# Patient Record
Sex: Female | Born: 1940 | Race: Black or African American | Hispanic: No | Smoking: Former smoker
Health system: Southern US, Community
[De-identification: ages and names within clinical notes are randomized; demographics above are authoritative.]

## PROBLEM LIST (undated history)

## (undated) DIAGNOSIS — I10 Essential (primary) hypertension: Secondary | ICD-10-CM

## (undated) DIAGNOSIS — M1990 Unspecified osteoarthritis, unspecified site: Secondary | ICD-10-CM

## (undated) DIAGNOSIS — E119 Type 2 diabetes mellitus without complications: Secondary | ICD-10-CM

## (undated) DIAGNOSIS — F329 Major depressive disorder, single episode, unspecified: Secondary | ICD-10-CM

## (undated) DIAGNOSIS — N179 Acute kidney failure, unspecified: Secondary | ICD-10-CM

## (undated) DIAGNOSIS — K5792 Diverticulitis of intestine, part unspecified, without perforation or abscess without bleeding: Secondary | ICD-10-CM

## (undated) DIAGNOSIS — F32A Depression, unspecified: Secondary | ICD-10-CM

## (undated) HISTORY — PX: BACK SURGERY: SHX140

## (undated) HISTORY — DX: Depression, unspecified: F32.A

## (undated) HISTORY — PX: OTHER SURGICAL HISTORY: SHX169

## (undated) HISTORY — DX: Diverticulitis of intestine, part unspecified, without perforation or abscess without bleeding: K57.92

## (undated) HISTORY — DX: Essential (primary) hypertension: I10

## (undated) HISTORY — PX: ROTATOR CUFF REPAIR: SHX139

## (undated) HISTORY — DX: Unspecified osteoarthritis, unspecified site: M19.90

## (undated) HISTORY — PX: BREAST SURGERY: SHX581

## (undated) HISTORY — DX: Major depressive disorder, single episode, unspecified: F32.9

## (undated) HISTORY — PX: KNEE SURGERY: SHX244

---

## 1997-08-06 ENCOUNTER — Ambulatory Visit (HOSPITAL_COMMUNITY): Admission: RE | Admit: 1997-08-06 | Discharge: 1997-08-06 | Payer: Self-pay | Admitting: Obstetrics and Gynecology

## 1997-10-19 ENCOUNTER — Other Ambulatory Visit: Admission: RE | Admit: 1997-10-19 | Discharge: 1997-10-19 | Payer: Self-pay | Admitting: Obstetrics and Gynecology

## 1998-02-14 ENCOUNTER — Encounter: Payer: Self-pay | Admitting: Obstetrics and Gynecology

## 1998-02-14 ENCOUNTER — Ambulatory Visit (HOSPITAL_COMMUNITY): Admission: RE | Admit: 1998-02-14 | Discharge: 1998-02-14 | Payer: Self-pay | Admitting: Obstetrics and Gynecology

## 1998-05-06 ENCOUNTER — Emergency Department (HOSPITAL_COMMUNITY): Admission: EM | Admit: 1998-05-06 | Discharge: 1998-05-06 | Payer: Self-pay | Admitting: Emergency Medicine

## 1999-03-04 ENCOUNTER — Ambulatory Visit (HOSPITAL_COMMUNITY): Admission: RE | Admit: 1999-03-04 | Discharge: 1999-03-04 | Payer: Self-pay | Admitting: Obstetrics and Gynecology

## 1999-03-04 ENCOUNTER — Encounter: Payer: Self-pay | Admitting: Obstetrics and Gynecology

## 1999-03-06 ENCOUNTER — Other Ambulatory Visit: Admission: RE | Admit: 1999-03-06 | Discharge: 1999-03-06 | Payer: Self-pay | Admitting: Obstetrics and Gynecology

## 1999-10-11 ENCOUNTER — Ambulatory Visit (HOSPITAL_COMMUNITY): Admission: RE | Admit: 1999-10-11 | Discharge: 1999-10-11 | Payer: Self-pay | Admitting: *Deleted

## 1999-10-11 ENCOUNTER — Encounter: Payer: Self-pay | Admitting: *Deleted

## 1999-11-18 ENCOUNTER — Encounter: Admission: RE | Admit: 1999-11-18 | Discharge: 1999-11-18 | Payer: Self-pay | Admitting: Neurosurgery

## 1999-11-18 ENCOUNTER — Encounter: Payer: Self-pay | Admitting: Neurosurgery

## 2000-02-28 ENCOUNTER — Emergency Department (HOSPITAL_COMMUNITY): Admission: EM | Admit: 2000-02-28 | Discharge: 2000-02-28 | Payer: Self-pay | Admitting: Emergency Medicine

## 2000-04-20 ENCOUNTER — Other Ambulatory Visit: Admission: RE | Admit: 2000-04-20 | Discharge: 2000-04-20 | Payer: Self-pay | Admitting: Obstetrics and Gynecology

## 2000-05-01 ENCOUNTER — Encounter: Payer: Self-pay | Admitting: Orthopedic Surgery

## 2000-05-01 ENCOUNTER — Ambulatory Visit (HOSPITAL_COMMUNITY): Admission: RE | Admit: 2000-05-01 | Discharge: 2000-05-01 | Payer: Self-pay | Admitting: *Deleted

## 2000-07-05 ENCOUNTER — Encounter: Admission: RE | Admit: 2000-07-05 | Discharge: 2000-07-05 | Payer: Self-pay | Admitting: Orthopedic Surgery

## 2000-07-05 ENCOUNTER — Encounter: Payer: Self-pay | Admitting: Orthopedic Surgery

## 2000-07-06 ENCOUNTER — Ambulatory Visit (HOSPITAL_BASED_OUTPATIENT_CLINIC_OR_DEPARTMENT_OTHER): Admission: RE | Admit: 2000-07-06 | Discharge: 2000-07-06 | Payer: Self-pay | Admitting: Orthopedic Surgery

## 2001-02-03 ENCOUNTER — Ambulatory Visit (HOSPITAL_COMMUNITY): Admission: RE | Admit: 2001-02-03 | Discharge: 2001-02-03 | Payer: Self-pay | Admitting: Internal Medicine

## 2001-02-03 ENCOUNTER — Encounter: Payer: Self-pay | Admitting: Internal Medicine

## 2001-04-20 ENCOUNTER — Other Ambulatory Visit: Admission: RE | Admit: 2001-04-20 | Discharge: 2001-04-20 | Payer: Self-pay | Admitting: Obstetrics and Gynecology

## 2001-05-07 ENCOUNTER — Encounter: Payer: Self-pay | Admitting: Emergency Medicine

## 2001-05-07 ENCOUNTER — Emergency Department (HOSPITAL_COMMUNITY): Admission: EM | Admit: 2001-05-07 | Discharge: 2001-05-07 | Payer: Self-pay | Admitting: Emergency Medicine

## 2001-10-19 ENCOUNTER — Encounter: Admission: RE | Admit: 2001-10-19 | Discharge: 2001-10-19 | Payer: Self-pay | Admitting: Pediatrics

## 2001-10-19 ENCOUNTER — Encounter: Payer: Self-pay | Admitting: Pediatrics

## 2002-02-10 ENCOUNTER — Ambulatory Visit (HOSPITAL_COMMUNITY): Admission: RE | Admit: 2002-02-10 | Discharge: 2002-02-10 | Payer: Self-pay | Admitting: Internal Medicine

## 2002-02-10 ENCOUNTER — Encounter: Payer: Self-pay | Admitting: Internal Medicine

## 2002-03-24 ENCOUNTER — Ambulatory Visit (HOSPITAL_COMMUNITY): Admission: RE | Admit: 2002-03-24 | Discharge: 2002-03-24 | Payer: Self-pay | Admitting: Gastroenterology

## 2002-05-05 ENCOUNTER — Other Ambulatory Visit: Admission: RE | Admit: 2002-05-05 | Discharge: 2002-05-05 | Payer: Self-pay | Admitting: Obstetrics and Gynecology

## 2002-11-28 ENCOUNTER — Encounter: Admission: RE | Admit: 2002-11-28 | Discharge: 2002-11-28 | Payer: Self-pay | Admitting: Internal Medicine

## 2002-12-12 ENCOUNTER — Emergency Department (HOSPITAL_COMMUNITY): Admission: AD | Admit: 2002-12-12 | Discharge: 2002-12-12 | Payer: Self-pay | Admitting: Family Medicine

## 2003-01-22 ENCOUNTER — Ambulatory Visit (HOSPITAL_COMMUNITY): Admission: RE | Admit: 2003-01-22 | Discharge: 2003-01-22 | Payer: Self-pay | Admitting: Orthopedic Surgery

## 2003-02-20 ENCOUNTER — Encounter: Admission: RE | Admit: 2003-02-20 | Discharge: 2003-02-20 | Payer: Self-pay | Admitting: Allergy and Immunology

## 2003-06-12 ENCOUNTER — Emergency Department (HOSPITAL_COMMUNITY): Admission: EM | Admit: 2003-06-12 | Discharge: 2003-06-12 | Payer: Self-pay | Admitting: Emergency Medicine

## 2003-07-01 ENCOUNTER — Ambulatory Visit (HOSPITAL_COMMUNITY): Admission: RE | Admit: 2003-07-01 | Discharge: 2003-07-01 | Payer: Self-pay | Admitting: Internal Medicine

## 2003-07-10 ENCOUNTER — Other Ambulatory Visit: Admission: RE | Admit: 2003-07-10 | Discharge: 2003-07-10 | Payer: Self-pay | Admitting: Obstetrics and Gynecology

## 2003-08-10 ENCOUNTER — Ambulatory Visit (HOSPITAL_COMMUNITY): Admission: RE | Admit: 2003-08-10 | Discharge: 2003-08-10 | Payer: Self-pay | Admitting: Obstetrics and Gynecology

## 2003-08-27 ENCOUNTER — Encounter: Admission: RE | Admit: 2003-08-27 | Discharge: 2003-10-08 | Payer: Self-pay | Admitting: Neurosurgery

## 2003-09-23 ENCOUNTER — Emergency Department (HOSPITAL_COMMUNITY): Admission: EM | Admit: 2003-09-23 | Discharge: 2003-09-23 | Payer: Self-pay | Admitting: Emergency Medicine

## 2003-10-05 ENCOUNTER — Encounter: Admission: RE | Admit: 2003-10-05 | Discharge: 2004-01-03 | Payer: Self-pay | Admitting: Orthopedic Surgery

## 2004-02-26 ENCOUNTER — Encounter: Admission: RE | Admit: 2004-02-26 | Discharge: 2004-02-26 | Payer: Self-pay | Admitting: Allergy and Immunology

## 2004-07-10 ENCOUNTER — Other Ambulatory Visit: Admission: RE | Admit: 2004-07-10 | Discharge: 2004-07-10 | Payer: Self-pay | Admitting: Obstetrics and Gynecology

## 2004-09-16 ENCOUNTER — Ambulatory Visit (HOSPITAL_COMMUNITY): Admission: RE | Admit: 2004-09-16 | Discharge: 2004-09-16 | Payer: Self-pay | Admitting: Sports Medicine

## 2004-09-29 ENCOUNTER — Encounter: Admission: RE | Admit: 2004-09-29 | Discharge: 2004-09-29 | Payer: Self-pay | Admitting: Sports Medicine

## 2004-10-16 ENCOUNTER — Encounter: Admission: RE | Admit: 2004-10-16 | Discharge: 2004-10-16 | Payer: Self-pay | Admitting: Sports Medicine

## 2004-10-31 ENCOUNTER — Encounter: Admission: RE | Admit: 2004-10-31 | Discharge: 2004-10-31 | Payer: Self-pay | Admitting: Sports Medicine

## 2004-12-02 ENCOUNTER — Emergency Department (HOSPITAL_COMMUNITY): Admission: EM | Admit: 2004-12-02 | Discharge: 2004-12-03 | Payer: Self-pay | Admitting: Emergency Medicine

## 2005-01-08 ENCOUNTER — Inpatient Hospital Stay (HOSPITAL_COMMUNITY): Admission: RE | Admit: 2005-01-08 | Discharge: 2005-01-10 | Payer: Self-pay | Admitting: Neurosurgery

## 2005-03-17 ENCOUNTER — Encounter: Admission: RE | Admit: 2005-03-17 | Discharge: 2005-03-17 | Payer: Self-pay | Admitting: Internal Medicine

## 2005-07-14 ENCOUNTER — Other Ambulatory Visit: Admission: RE | Admit: 2005-07-14 | Discharge: 2005-07-14 | Payer: Self-pay | Admitting: Obstetrics and Gynecology

## 2005-08-20 ENCOUNTER — Ambulatory Visit (HOSPITAL_COMMUNITY): Admission: RE | Admit: 2005-08-20 | Discharge: 2005-08-20 | Payer: Self-pay | Admitting: Neurosurgery

## 2006-05-04 ENCOUNTER — Encounter: Admission: RE | Admit: 2006-05-04 | Discharge: 2006-05-04 | Payer: Self-pay | Admitting: Obstetrics and Gynecology

## 2006-07-19 ENCOUNTER — Other Ambulatory Visit: Admission: RE | Admit: 2006-07-19 | Discharge: 2006-07-19 | Payer: Self-pay | Admitting: Obstetrics and Gynecology

## 2006-09-12 ENCOUNTER — Emergency Department (HOSPITAL_COMMUNITY): Admission: EM | Admit: 2006-09-12 | Discharge: 2006-09-13 | Payer: Self-pay | Admitting: Emergency Medicine

## 2007-09-29 ENCOUNTER — Encounter: Admission: RE | Admit: 2007-09-29 | Discharge: 2007-09-29 | Payer: Self-pay | Admitting: Obstetrics and Gynecology

## 2007-10-04 ENCOUNTER — Ambulatory Visit: Payer: Self-pay | Admitting: Obstetrics and Gynecology

## 2008-01-29 ENCOUNTER — Inpatient Hospital Stay (HOSPITAL_COMMUNITY): Admission: EM | Admit: 2008-01-29 | Discharge: 2008-02-01 | Payer: Self-pay | Admitting: *Deleted

## 2008-05-07 ENCOUNTER — Ambulatory Visit: Payer: Self-pay | Admitting: Obstetrics and Gynecology

## 2008-07-16 ENCOUNTER — Ambulatory Visit: Payer: Self-pay | Admitting: Obstetrics and Gynecology

## 2008-07-31 ENCOUNTER — Ambulatory Visit: Payer: Self-pay | Admitting: Obstetrics and Gynecology

## 2008-08-09 ENCOUNTER — Ambulatory Visit: Payer: Self-pay | Admitting: Gynecology

## 2008-08-17 ENCOUNTER — Ambulatory Visit: Payer: Self-pay | Admitting: Obstetrics and Gynecology

## 2008-10-29 ENCOUNTER — Other Ambulatory Visit: Admission: RE | Admit: 2008-10-29 | Discharge: 2008-10-29 | Payer: Self-pay | Admitting: Obstetrics and Gynecology

## 2008-10-29 ENCOUNTER — Encounter: Payer: Self-pay | Admitting: Obstetrics and Gynecology

## 2008-10-29 ENCOUNTER — Ambulatory Visit: Payer: Self-pay | Admitting: Obstetrics and Gynecology

## 2008-11-28 ENCOUNTER — Ambulatory Visit: Payer: Self-pay | Admitting: Obstetrics and Gynecology

## 2009-04-11 ENCOUNTER — Encounter: Admission: RE | Admit: 2009-04-11 | Discharge: 2009-04-11 | Payer: Self-pay | Admitting: Internal Medicine

## 2009-07-17 ENCOUNTER — Ambulatory Visit: Payer: Self-pay | Admitting: Gynecology

## 2009-07-30 ENCOUNTER — Encounter: Admission: RE | Admit: 2009-07-30 | Discharge: 2009-07-30 | Payer: Self-pay | Admitting: Internal Medicine

## 2009-10-20 ENCOUNTER — Emergency Department (HOSPITAL_COMMUNITY)
Admission: EM | Admit: 2009-10-20 | Discharge: 2009-10-20 | Payer: Self-pay | Source: Home / Self Care | Admitting: Emergency Medicine

## 2010-02-08 ENCOUNTER — Encounter: Payer: Self-pay | Admitting: Orthopedic Surgery

## 2010-02-09 ENCOUNTER — Encounter: Payer: Self-pay | Admitting: Internal Medicine

## 2010-02-09 ENCOUNTER — Encounter: Payer: Self-pay | Admitting: Obstetrics and Gynecology

## 2010-02-13 ENCOUNTER — Observation Stay (HOSPITAL_COMMUNITY)
Admission: EM | Admit: 2010-02-13 | Discharge: 2010-02-13 | Payer: Self-pay | Source: Home / Self Care | Admitting: Emergency Medicine

## 2010-02-13 LAB — URINE MICROSCOPIC-ADD ON

## 2010-02-13 LAB — BASIC METABOLIC PANEL
Chloride: 102 mEq/L (ref 96–112)
Creatinine, Ser: 0.66 mg/dL (ref 0.4–1.2)
GFR calc Af Amer: >60 mL/min (ref >60)
Potassium: 4 mEq/L (ref 3.5–5.1)

## 2010-02-13 LAB — DIFFERENTIAL
Basophils Relative: 1 % (ref 0–1)
Eosinophils Absolute: 0 10*3/uL (ref 0.0–0.7)
Lymphocytes Relative: 34 % (ref 12–46)
Monocytes Relative: 5 % (ref 3–12)
Neutro Abs: 4.8 10*3/uL (ref 1.7–7.7)

## 2010-02-13 LAB — URINALYSIS, ROUTINE W REFLEX MICROSCOPIC
Bilirubin Urine: NEGATIVE
Ketones, ur: NEGATIVE mg/dL
Urine Glucose, Fasting: >1000 mg/dL — AB
pH: 5.5 (ref 5.0–8.0)

## 2010-02-13 LAB — CBC
Hemoglobin: 11.6 g/dL — ABNORMAL LOW (ref 12.0–15.0)
MCH: 26.1 pg (ref 26.0–34.0)
MCV: 82 fL (ref 78.0–100.0)
RBC: 4.44 MIL/uL (ref 3.87–5.11)

## 2010-02-14 LAB — URINE CULTURE: Colony Count: 6000

## 2010-02-18 ENCOUNTER — Emergency Department (HOSPITAL_COMMUNITY)
Admission: EM | Admit: 2010-02-18 | Discharge: 2010-02-18 | Disposition: A | Discharge status: Home / Self Care | Payer: Medicare Other | Attending: Emergency Medicine | Admitting: Emergency Medicine

## 2010-02-18 DIAGNOSIS — R0602 Shortness of breath: Secondary | ICD-10-CM | POA: Insufficient documentation

## 2010-02-18 DIAGNOSIS — J9801 Acute bronchospasm: Secondary | ICD-10-CM | POA: Insufficient documentation

## 2010-02-18 DIAGNOSIS — I10 Essential (primary) hypertension: Secondary | ICD-10-CM | POA: Insufficient documentation

## 2010-02-18 DIAGNOSIS — E119 Type 2 diabetes mellitus without complications: Secondary | ICD-10-CM | POA: Insufficient documentation

## 2010-02-26 ENCOUNTER — Ambulatory Visit: Payer: Medicare Other | Admitting: Gynecology

## 2010-02-26 DIAGNOSIS — N898 Other specified noninflammatory disorders of vagina: Secondary | ICD-10-CM

## 2010-02-26 DIAGNOSIS — B3731 Acute candidiasis of vulva and vagina: Secondary | ICD-10-CM

## 2010-02-26 DIAGNOSIS — B373 Candidiasis of vulva and vagina: Secondary | ICD-10-CM

## 2010-03-25 ENCOUNTER — Ambulatory Visit (INDEPENDENT_AMBULATORY_CARE_PROVIDER_SITE_OTHER): Payer: Medicare Other | Admitting: Obstetrics and Gynecology

## 2010-03-25 DIAGNOSIS — B3731 Acute candidiasis of vulva and vagina: Secondary | ICD-10-CM

## 2010-03-25 DIAGNOSIS — N898 Other specified noninflammatory disorders of vagina: Secondary | ICD-10-CM

## 2010-03-25 DIAGNOSIS — B373 Candidiasis of vulva and vagina: Secondary | ICD-10-CM

## 2010-05-05 LAB — BASIC METABOLIC PANEL
CO2: 25 mEq/L (ref 19–32)
CO2: 28 mEq/L (ref 19–32)
Calcium: 7.9 mg/dL — ABNORMAL LOW (ref 8.4–10.5)
Calcium: 8.4 mg/dL (ref 8.4–10.5)
Creatinine, Ser: 0.49 mg/dL (ref 0.4–1.2)
Creatinine, Ser: 0.71 mg/dL (ref 0.4–1.2)
GFR calc Af Amer: >60 mL/min (ref >60)
GFR calc non Af Amer: >60 mL/min (ref >60)
GFR calc non Af Amer: >60 mL/min (ref >60)
Glucose, Bld: 202 mg/dL — ABNORMAL HIGH (ref 70–99)
Sodium: 137 mEq/L (ref 135–145)

## 2010-05-05 LAB — GLUCOSE, CAPILLARY
Glucose-Capillary: 152 mg/dL — ABNORMAL HIGH (ref 70–99)
Glucose-Capillary: 153 mg/dL — ABNORMAL HIGH (ref 70–99)
Glucose-Capillary: 162 mg/dL — ABNORMAL HIGH (ref 70–99)
Glucose-Capillary: 165 mg/dL — ABNORMAL HIGH (ref 70–99)
Glucose-Capillary: 166 mg/dL — ABNORMAL HIGH (ref 70–99)
Glucose-Capillary: 169 mg/dL — ABNORMAL HIGH (ref 70–99)
Glucose-Capillary: 182 mg/dL — ABNORMAL HIGH (ref 70–99)
Glucose-Capillary: 199 mg/dL — ABNORMAL HIGH (ref 70–99)
Glucose-Capillary: 205 mg/dL — ABNORMAL HIGH (ref 70–99)
Glucose-Capillary: 208 mg/dL — ABNORMAL HIGH (ref 70–99)
Glucose-Capillary: 209 mg/dL — ABNORMAL HIGH (ref 70–99)
Glucose-Capillary: 244 mg/dL — ABNORMAL HIGH (ref 70–99)
Glucose-Capillary: 247 mg/dL — ABNORMAL HIGH (ref 70–99)

## 2010-05-05 LAB — URINALYSIS, ROUTINE W REFLEX MICROSCOPIC
Bilirubin Urine: NEGATIVE
Hgb urine dipstick: NEGATIVE
Specific Gravity, Urine: 1.019 (ref 1.005–1.030)
Urobilinogen, UA: 1 mg/dL (ref 0.0–1.0)

## 2010-05-05 LAB — HEMOGLOBIN A1C
Hgb A1c MFr Bld: 7.3 % — ABNORMAL HIGH (ref 4.6–6.1)
Mean Plasma Glucose: 163 mg/dL

## 2010-05-05 LAB — COMPREHENSIVE METABOLIC PANEL
Albumin: 3.6 g/dL (ref 3.5–5.2)
BUN: 12 mg/dL (ref 6–23)
Calcium: 9.7 mg/dL (ref 8.4–10.5)
Creatinine, Ser: 0.59 mg/dL (ref 0.4–1.2)
Potassium: 3.3 mEq/L — ABNORMAL LOW (ref 3.5–5.1)
Total Protein: 6.3 g/dL (ref 6.0–8.3)

## 2010-05-05 LAB — CBC
HCT: 38.5 % (ref 36.0–46.0)
Hemoglobin: 10.9 g/dL — ABNORMAL LOW (ref 12.0–15.0)
MCHC: 32.8 g/dL (ref 30.0–36.0)
MCHC: 32.8 g/dL (ref 30.0–36.0)
MCV: 88.8 fL (ref 78.0–100.0)
Platelets: 267 10*3/uL (ref 150–400)
RBC: 3.73 MIL/uL — ABNORMAL LOW (ref 3.87–5.11)
RDW: 14.6 % (ref 11.5–15.5)
RDW: 15.4 % (ref 11.5–15.5)

## 2010-05-05 LAB — URINE CULTURE

## 2010-05-05 LAB — URINE MICROSCOPIC-ADD ON

## 2010-05-05 LAB — DIFFERENTIAL
Lymphocytes Relative: 8 % — ABNORMAL LOW (ref 12–46)
Lymphs Abs: 1 10*3/uL (ref 0.7–4.0)
Monocytes Absolute: 0.6 10*3/uL (ref 0.1–1.0)
Monocytes Relative: 5 % (ref 3–12)
Neutro Abs: 10.7 10*3/uL — ABNORMAL HIGH (ref 1.7–7.7)

## 2010-05-07 ENCOUNTER — Encounter (INDEPENDENT_AMBULATORY_CARE_PROVIDER_SITE_OTHER): Payer: Medicare Other | Admitting: Obstetrics and Gynecology

## 2010-05-07 DIAGNOSIS — R82998 Other abnormal findings in urine: Secondary | ICD-10-CM

## 2010-05-07 DIAGNOSIS — N898 Other specified noninflammatory disorders of vagina: Secondary | ICD-10-CM

## 2010-05-07 DIAGNOSIS — B373 Candidiasis of vulva and vagina: Secondary | ICD-10-CM

## 2010-05-07 DIAGNOSIS — M949 Disorder of cartilage, unspecified: Secondary | ICD-10-CM

## 2010-06-03 NOTE — Discharge Summary (Signed)
NAMEOMAYA, Leslie Gamble                ACCOUNT NO.:  [id number]   MEDICAL RECORD NO.:  [id number]          PATIENT TYPE:  INP   LOCATION:  6705                         FACILITY:  [facility]   PHYSICIAN:  Georgann Housekeeper, MD      DATE OF BIRTH:  12-16-1940   DATE OF ADMISSION:  01/29/2008  DATE OF DISCHARGE:  02/01/2008                               DISCHARGE SUMMARY   DISCHARGE DIAGNOSES:  1. Acute diverticulitis with microperforation.  2. Type 2 diabetes.  3. Hypertension.  4. Hypokalemia.  5. History of depression.  6. Reactive airway disease.   MEDICATION ON DISCHARGE:  1. Metformin 1000 mg b.i.d.  2. Avandia 8 mg daily.  3. Glipizide 5 mg b.i.d.  4. Hyzaar 100/25 one daily.  5. Norvasc 10 mg daily.  6. Nexium 40 mg daily.  7. Potassium 8 mEq 2 tablets daily.  8. Xanax 0.5 mg p.r.n.  9. Cymbalta 60 mg daily.  10.Cipro 500 mg b.i.d. for 10 days.  11.Flagyl 500 mg b.i.d. for 10 days.  12.Darvocet-N 100 q. 6 p.r.n. for pain.  13.Ambien 10 mg nightly p.r.n. for sleep.  14.Symbicort 160/4.5 two inhalations daily.  15.Nasonex nasal spray daily.   As far as her laboratory data; potassium 3.4, creatinine 0.7, sodium  139.  White count 7.3, hemoglobin 10.9.  LFTs were normal.  A1c 7.3.  Urine culture negative.  CT of the abdomen and pelvic showed  diverticulitis acute with no abscess, mild microperforation.  UA  negative.  Blood pressure 139/84, pulse 87, temperature 98.   HOSPITAL COURSE:  A 70 year old female with above medical problems  admitted with abdominal pain, 2 days history, found to have elevated  white count 12,000 with acute diverticulitis on the CT scan with no  evidence of any abscess or rupture, possible microperforation or  stranding.   The patient was admitted with IV fluids, IV antibiotic Cipro and Flagyl,  did well, improved abdominal pain.  White count became normal.  She  remained afebrile.  She had bowel movements without any bleeding.  Her  diet was advanced  slowly from liquids to regular.  For pain control, she  had some morphine, did not require much of the pain medication.   As far as her hypertension and diabetes, the patient was continued on  current medications.  She did require some sliding scale insulin.  We  will resume her p.o. medication at home.  Hypokalemia, the patient had  required some potassium supplement because of her Hyzaar.  Her potassium  will be increased to twice a day at home and recheck that at the office  in about 1-2 weeks.  Depression and anxiety, continue on Xanax and  Cymbalta.  For pain medication at home, she will take Darvocet.  She did  have little bit of constipation initially, got some MiraLax and Colace,  had some bowel movements.  She does not usually use any regimen at home.  Continue with fiber and bland diet.  I will see her back in 10 days.  Home health nursing.      Georgann Housekeeper, MD  Electronically Signed     KH/MEDQ  D:  02/01/2008  T:  02/01/2008  Job:  [id number]

## 2010-06-03 NOTE — H&P (Signed)
Leslie Gamble, Leslie Gamble                ACCOUNT NO.:  [id number]   MEDICAL RECORD NO.:  [id number]          PATIENT TYPE:  INP   LOCATION:  1823                         FACILITY:  [facility]   PHYSICIAN:  Hollice Espy, M.D.DATE OF BIRTH:  July 08, 1940   DATE OF ADMISSION:  01/29/2008  DATE OF DISCHARGE:                              HISTORY & PHYSICAL   PRIMARY CARE PHYSICIAN:  Georgann Housekeeper, MD   CHIEF COMPLAINT:  Abdominal pain.   HISTORY OF PRESENT ILLNESS:  The patient is a 70 year old African  American female with past medical history of diverticulosis, diabetes  mellitus and previous history of colonic anastomoses secondary to  perforated colonic diverticulosis, who presented to the emergency room  today complaining of one day history of abdominal pain associated with  nausea and vomiting.  She had no diarrhea, no nausea or vomiting;  however, she was complaining of pain, this was more on the right side to  right upper quadrant.  When she had labs done, she was found to have a  normal lipase level, slightly high sugar of 232 and a white count of  12.3 with an 87% shift.  The rest of her labs were unremarkable  including a normal urinalysis.  However, a CT of the abdomen done  without contrast noted some postop changes, some extensive  diverticulosis of the colon, a small area of pericolonic stranding and  extraluminal air at the hepatic flexure consistent with diverticulitis,  and no evidence of an abscess collection.  With these findings, it was  felt the patient likely had diverticulitis with micro perforation and  felt that she best needed to come in for further evaluation and  treatment.  What makes it further difficult is the fact the patient  states that she is allergic to every single antibiotic.  The patient,  after giving medication for pain, is otherwise doing well.  She denies  any headaches, vision changes, dysphagia, chest pain, palpitations,  shortness of breath,  wheezing or coughing.   REVIEW OF SYSTEMS:  No hematuria, dysuria, constipation, diarrhea, focal  extremity numbness, weakness or pain.  Review of systems otherwise  negative.   PAST MEDICAL HISTORY:  Includes history of:  1. Extensive colonic diverticulosis, status post partial colonic      resection with anastomoses secondary to previous perforated      diverticulum.  2. History of diabetes mellitus.  3. History of lumbar spine stenoses, status post lumbar decompression      surgery.  4. Hypertension.  5. Anxiety and depression.   MEDICATIONS:  She is on:  1. Metformin 500 four times a day.  2. Glipizide 5 b.i.d.  3. Hyzaar 100/25 daily.  4. Norvasc 10.  5. Avandia 8.  6. Nexium 40 daily.  7. K-Dur 8 mg p.o. daily.  8. Xanax 0.5 p.o. daily p.r.n.  9. Cymbalta 60.   She reports allergies to every antibiotic there is including PENICILLIN,  TETRACYCLINE, BIAXIN, CODEINE, SULFA, MACROLIDES, QUINOLONE.  She also  has allergies to CODEINE, VALIUM and VICODIN.   She denies any alcohol or drug use.  She  does smoke.  She does not want  a nicotine patch.   FAMILY HISTORY:  Noncontributory.   PHYSICAL EXAMINATION:  VITALS:  On admission, temperature 98.9, heart  rate 118, now down to 100, blood pressure 160/82, respirations 28, O2  saturation 100% on room air.  GENERAL: She is alert with T-max of 100.4.  GENERAL:  She is oriented  x3, in no apparent distress.  HEENT:  Normocephalic, atraumatic.  Mucous membranes are slightly dry.  She has no carotid bruits.  HEART: Regular rate and rhythm, S1-S2.  LUNGS:  Clear to auscultation bilaterally.  ABDOMEN:  Soft, lightly distended.  Some tenderness, more on the right  upper to mid quadrant.  Hypoactive bowel sounds.  EXTREMITIES:  No clubbing, cyanosis, trace edema.   LAB WORK:  Greater than 1000 glucose, 30 protein.  White count 12.3, H  and H is 12.6 and 39, MCV of 89, platelet count 267, 87% neutrophils.  Sodium 135, potassium  3.3, chloride 97, bicarb 27, BUN 12, creatinine  0.6, glucose 232.  UA shows 0-2 white and red cells with rare bacteria.  Lipase is only 17.  Abdominal CT without contrast is as per HPI.   PLAN:  1. Diverticulitis, micro perforation.  Will try to go with IV Flagyl.      It is not listed as an allergy and try IV Cipro to see if this is      indeed a true allergy.  We will treat p.r.n. Benadryl in the      interim; if not, we will try alternative antibiotics.  2. Diabetes mellitus.  N.p.o. on sliding scale.  3. Hypokalemia.  We will replace.  4. Anxiety and depression.  Holding p.o. medicines.      Hollice Espy, M.D.  Electronically Signed     SKK/MEDQ  D:  01/29/2008  T:  01/29/2008  Job:  [id number]   cc:   Georgann Housekeeper, MD

## 2010-06-06 NOTE — Op Note (Signed)
NAMEKINZE, LABO                ACCOUNT NO.:  [id number]   MEDICAL RECORD NO.:  [id number]          PATIENT TYPE:  INP   LOCATION:  3006                         FACILITY:  [facility]   PHYSICIAN:  Coletta Memos, M.D.     DATE OF BIRTH:  02/01/1939   DATE OF PROCEDURE:  01/07/2005  DATE OF DISCHARGE:                                 OPERATIVE REPORT   PREOPERATIVE DIAGNOSES:  1.  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  2.  Right L5 radiculopathy.  3.  Spondylosis, lumbar spine.   POSTOPERATIVE DIAGNOSES:  1.  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  2.  Right L5 radiculopathy.  3.  Spondylosis, lumbar spine.   PROCEDURE:  Right L4-5 semihemilaminectomy, medial facetectomy and  decompression of left L4 and L5 nerve roots.   COMPLICATIONS:  None.   ANESTHESIA:  General endotracheal.   INDICATIONS:  Leslie Gamble is a 70 year old woman who presented with pain in  the back and right lower extremity. She underwent conservative treatment  without improvement. She therefore returned to the office and I recommended  and she agreed to undergo operative decompression.   OPERATIVE NOTE:  Ms. Smead was brought to the operating room, intubated and  placed under general anesthetic. She was rolled prone onto a Wilson frame  and all pressure points were properly padded.  Her back was prepped. She was  draped in a sterile fashion. I infiltrated 10 mL 0.5% lidocaine 1:200,000  epinephrine on the right side. I opened the skin and exposed the lamina of  L3, L4 and L5.  I did a extraforaminal facetectomy at L4-5 and then turned  my attention to the intraspinal component. I performed a semihemilaminectomy  of L4 using a high-speed drill. With microdissection, I was able remove  ligament and identify the L4 nerve root. I decompressed that L4 nerve root  with Dr. Verlee Rossetti assistance using Kerrison punches and curettes. The disk  there was quite prominent but I did not feel since I had taken the  medial  facette and since the facettes were quite arthropathic, that if I took that  this I would have to therefore do a fusion. I was able to do a foraminotomy  and believe that both the L4 and L5 roots being decompressed at that time,  that a diskectomy was superfluous. I therefore, with microdissection, fully  decompressed both nerve roots. I then irrigated the wound. I saw that there  was an area where the dura was off of the lateral aspect of the thecal sac  but the arachnoid was intact and there was no leak of spinal fluid. I  therefore  placed a small piece of Duragen over that area and then used Tisseel to  insure that spinal fluid leak would not develop later.  I then closed the  wound in layered fashion using Vicryl sutures. I reapproximated the  thoracolumbar fascia, subcutaneous tissue and subcuticular layers.           ______________________________  Coletta Memos, M.D.     KC/MEDQ  D:  01/07/2005  T:  01/09/2005  Job:  [id number] 

## 2010-06-06 NOTE — Discharge Summary (Signed)
NAMECARLA, Leslie Gamble                ACCOUNT NO.:  [id number]   MEDICAL RECORD NO.:  [id number]          PATIENT TYPE:  INP   LOCATION:  3006                         FACILITY:  [facility]   PHYSICIAN:  Stefani Dama, M.D.  DATE OF BIRTH:  02/01/1939   DATE OF ADMISSION:  01/07/2005  DATE OF DISCHARGE:  01/10/2005                                 DISCHARGE SUMMARY   ADMITTING DIAGNOSES:  1.  Lumbar spondylosis lateral recess stenosis lumbar vertebrae-4/5 on the      right with right lumbar vertebrae-5 radiculopathy.  2.  Diabetes mellitus.  3.  Hypertension  4.  Multiple drug allergies.   FINAL DIAGNOSES:  1.  Lumbar spondylosis lateral recess stenosis lumbar vertebrae-4/5 on the      right with right lumbar vertebrae-5 radiculopathy.  2.  Diabetes mellitus.  3.  Hypertension  4.  Multiple drug allergies.   CONDITION ON DISCHARGE:  Improving.   HOSPITAL COURSE:  [name] is a 70 year old individual who presented with  back and right lower extremity pain for several months time. She underwent  numerous conservative management over a four-month period of time and had no  relief of her radicular pain and in fact was getting worse. She was advised  regarding surgical decompression on the right side at L4-L5 via a semi-  hemilaminectomy. She tolerated this procedure well. Her diabetes was  somewhat difficult to control initially. Pain management was also somewhat  difficult as she has multiple drug allergies to narcotic analgesics and  several muscle relaxers including Flexeril. She was treated with Toradol  orally and IV and she would tolerate this well. She knows that she has  several oral preparations at home which she can tolerate. She was given a  prescription for Robaxin which she may use as a muscle relaxer and hopefully  she will not have an allergy to this. Incision is clean and dry. She has  been advised as to postoperative activities including walking as tolerated.  She may  shower. Also she is to avoid a bath for the next couple of weeks  until the incision is dry and healed well. At the time of discharge, she was  given a prescription for Robaxin. She will follow up with Dr. Franky Macho in  approximately three weeks time.      Stefani Dama, M.D.  Electronically Signed     HJE/MEDQ  D:  01/10/2005  T:  01/12/2005  Job:  [id number]

## 2010-06-06 NOTE — Op Note (Signed)
   [name], Leslie Gamble                          ACCOUNT NO.:  [id number]   MEDICAL RECORD NO.:  [id number]                   PATIENT TYPE:  AMB   LOCATION:  ENDO                                 FACILITY:  [facility]   PHYSICIAN:  Danise Edge, M.D.                DATE OF BIRTH:  02/01/1939   DATE OF PROCEDURE:  03/24/2002  DATE OF DISCHARGE:                                 OPERATIVE REPORT   PROCEDURE:  Colonoscopy.   INDICATIONS FOR PROCEDURE:  [name] is a 70 year old female born  February 01, 1939. Approximately 11 years ago, she developed a perforated  diverticulum requiring surgery. Proctocolonoscopy to the cecum in 1997  revealed universal colonic diverticulosis but no colorectal neoplasia. Ms.  Tafolla is scheduled to undergo a screening colonoscopy with polypectomy to  prevent colon cancer.   ENDOSCOPIST:  Charolett Bumpers, M.D.   PREMEDICATION:  Versed 7.5 mg, Demerol 50 mg .   ENDOSCOPE:  Olympus pediatric colonoscope.   DESCRIPTION OF PROCEDURE:  After obtaining informed consent, Ms. Risk was  placed in the left lateral decubitus position. I administered intravenous  Demerol and intravenous Versed to achieve conscious sedation for the  procedure. The patient's blood pressure, oxygen saturation and cardiac  rhythm were monitored throughout the procedure and documented in the medical  record.   Anal inspection was normal. Digital rectal exam was normal. The Olympus  pediatric video colonoscope was introduced into the rectum and advanced to  the cecum. Colonic preparation for the exam today was excellent.   [name] has universal colonic diverticulosis without signs of  diverticulitis or diverticular bleeding.   RECTUM:  Normal.   SIGMOID COLON AND DESCENDING COLON:  Normal.   SPLENIC FLEXURE:  Normal.   TRANSVERSE COLON:  Normal.   HEPATIC FLEXURE:  Normal.   ASCENDING COLON:  Normal.   CECUM AND ILEOCECAL VALVE:  Normal.    ASSESSMENT:   Universal colonic diverticulosis; otherwise, normal  proctocolonoscopy to the cecum. No endoscopic evidence for the presence of  colorectal neoplasia.                                               Danise Edge, M.D.    MJ/MEDQ  D:  03/24/2002  T:  03/25/2002  Job:  [id number]   cc:   Georgann Housekeeper, M.D.  [address]  [place]  [place] [address]  Fax: [phone]   Leonie Man, M.D.  [address], [address]  [place]  [place] [address]  Fax: [phone]

## 2010-06-06 NOTE — Op Note (Signed)
[place]  Patient:    Leslie Gamble, Leslie Gamble                       MRN: [id number] Proc. Date: 07/06/00 Adm. Date:  [id number] Attending:  Twana First                           Operative Report  PREOPERATIVE DIAGNOSIS:  Right shoulder rotator cuff tear.  POSTOPERATIVE DIAGNOSES: 1. Right shoulder rotator cuff tear. 2. Right shoulder biceps tendon partial tear. 3. Right shoulder partial labrum tear. 4. Right shoulder impingement.  PROCEDURE: 1. Right shoulder examination under anesthesia, followed by arthroscopic    partial biceps tendon tear debridement, and partial labrum tear    debridement. 2. Right shoulder open rotator cuff repair. 3. Right shoulder subacromial decompression.  SURGEON:  Elana Alm. Thurston Hole, M.D.  ASSISTANT:  Julien Girt, P.A.  ANESTHESIA:  General.  OPERATIVE TIME:  One hour.  COMPLICATIONS:  None.  INDICATIONS:  [name] is a 70 year old woman who injured her right shoulder in a fall in February 2002.  Significant pain in the shoulder.  An MRI and examination documented complete rotator cuff tear.  She has failed conservative care and is now to undergo an arthroscopy and a rotator cuff repair.  DESCRIPTION OF PROCEDURE:  [name] was brought to the operating room on July 06, 2000, after a supraclavicular block had been placed in the holding room.  She was placed on the operating room table in a supine position.  Her right shoulder was examined under anesthesia.  She had a full range of motion. Her shoulder was stable to ligamentous examination.  After being placed under general anesthesia, she was placed in the beach chair position.  Her shoulder and arm were prepped using sterile Betadine and draped using sterile technique.  Initially the arthroscopy was performed through a posterior arthroscopic portal.  The arthroscope with a pump attached was placed, and through an anterior portal an arthroscopic  probe was placed.  On initial inspection the articular cartilage and the glenohumeral joint showed mild grade 2 and 25%-30% grade 3 chondromalacia which was debrided.  The anterior labrum partial tearing in the midportion 25%, which was debrided.  The anterior inferior labrum and the anterior inferior glenohumeral ligament complex was intact.  The superior labrum was intact.  The biceps tendon anchor was intact.  The biceps tendon had a high-grade partial tear of 50%-60% which was debrided, but the rest of the biceps tendon was intact.  The posterior labrum was intact.  The rotator cuff showed a complete tear of the supraspinatus and a partial tear of the infraspinatus, and this was partially debrided arthroscopically.  A lateral portal was made, and the subacromial space was entered.  The subtotal bursectomy was carried out.  The rotator cuff tear was thoroughly debrided.  The subacromial decompression was carried out, removing 6.0 to 8.0 mm of the undersurface of the anterior, anterolateral, and anteromedial acromion.  At this point then through the lateral portal, a 4.0 cm deltoid-splitting approach was made.  The underlying subcutaneous tissues were incised in line with the skin incision.  The deltoid muscle was split longitudinally, revealing the underlying subacromial space.  The rotator cuff was further sharply debrided, and then two separate Arthrax suture anchors were placed in the greater tuberosity, and each of these had the two Ethibond sutures placed through the rotator cuff tendon, and  then tied down thus resecuring the rotator cuff tear back down to the greater tuberosity.  After this was done, and all four sutures were tied down and anchored, there was found to be excellent repair.  The shoulder could be brought through a range of motion with no impingement upon the repair.  At this point then the wound was irrigated and closed using #0 Vicryl to close the deltoid fascia  and subcutaneous tissue, and #3-0 Prolene in the subcuticular layer.  Steri-Strips were applied.  The subacromial space had a pain catheter placed for postoperative pain control.  Sterile dressings and a sling were applied.  Then the patient was awakened and taken to the recovery room in stable condition.  FOLLOW-UP CARE:  [name] will be followed overnight in the recovery care center for IV pain control and neurovascular monitoring.  She will be discharged tomorrow on Percocet and Voltaren.  She will have her pain catheter removed in two days per operative instructions.  She will begin early physical therapy for range of motion, followed by strengthening. DD:  07/06/00 TD:  07/06/00 Job: [id number]

## 2010-06-06 NOTE — Discharge Summary (Signed)
[name], Leslie Gamble                ACCOUNT NO.:  [id number]   MEDICAL RECORD NO.:  [id number]          PATIENT TYPE:  INP   LOCATION:  3006                         FACILITY:  [facility]   PHYSICIAN:  Coletta Memos, M.D.     DATE OF BIRTH:  02/01/1939   DATE OF ADMISSION:  01/07/2005  DATE OF DISCHARGE:  01/10/2005                                 DISCHARGE SUMMARY   ADMITTING DIAGNOSIS:  Lateral recess stenosis, right L4-L5.   PROCEDURE:  Right L4-L5 semi hemilaminectomy and foraminotomy.   COMPLICATIONS:  None.   DISCHARGE STATUS:  Alive and well.  Wound clean, dry and without signs of  infection.  5/5 strength in the lower extremities.  The patient is tolerated  a regular diet, walking and voiding without difficulty.  She will be sent  home with Lorcet and Flexeril for pain and muscle spasm control.  She will  receive return appointment to see me in approximately 3-4 weeks.           ______________________________  Coletta Memos, M.D.     KC/MEDQ  D:  01/09/2005  T:  01/12/2005  Job:  [id number]

## 2011-01-05 ENCOUNTER — Other Ambulatory Visit: Payer: Self-pay | Admitting: Internal Medicine

## 2011-01-05 DIAGNOSIS — J329 Chronic sinusitis, unspecified: Secondary | ICD-10-CM

## 2011-01-06 ENCOUNTER — Ambulatory Visit
Admission: RE | Admit: 2011-01-06 | Discharge: 2011-01-06 | Disposition: A | Discharge status: Home / Self Care | Payer: Medicare Other | Source: Ambulatory Visit | Attending: Internal Medicine | Admitting: Internal Medicine

## 2011-01-06 DIAGNOSIS — J329 Chronic sinusitis, unspecified: Secondary | ICD-10-CM

## 2011-01-23 DIAGNOSIS — J45909 Unspecified asthma, uncomplicated: Secondary | ICD-10-CM | POA: Diagnosis not present

## 2011-01-28 DIAGNOSIS — E119 Type 2 diabetes mellitus without complications: Secondary | ICD-10-CM | POA: Diagnosis not present

## 2011-02-04 DIAGNOSIS — I10 Essential (primary) hypertension: Secondary | ICD-10-CM | POA: Diagnosis not present

## 2011-02-04 DIAGNOSIS — E782 Mixed hyperlipidemia: Secondary | ICD-10-CM | POA: Diagnosis not present

## 2011-02-04 DIAGNOSIS — E119 Type 2 diabetes mellitus without complications: Secondary | ICD-10-CM | POA: Diagnosis not present

## 2011-02-11 DIAGNOSIS — J45909 Unspecified asthma, uncomplicated: Secondary | ICD-10-CM | POA: Diagnosis not present

## 2011-02-24 DIAGNOSIS — H1030 Unspecified acute conjunctivitis, unspecified eye: Secondary | ICD-10-CM | POA: Diagnosis not present

## 2011-02-24 DIAGNOSIS — H01009 Unspecified blepharitis unspecified eye, unspecified eyelid: Secondary | ICD-10-CM | POA: Diagnosis not present

## 2011-02-24 DIAGNOSIS — H00019 Hordeolum externum unspecified eye, unspecified eyelid: Secondary | ICD-10-CM | POA: Diagnosis not present

## 2011-03-17 DIAGNOSIS — R141 Gas pain: Secondary | ICD-10-CM | POA: Diagnosis not present

## 2011-03-17 DIAGNOSIS — R143 Flatulence: Secondary | ICD-10-CM | POA: Diagnosis not present

## 2011-03-17 DIAGNOSIS — J069 Acute upper respiratory infection, unspecified: Secondary | ICD-10-CM | POA: Diagnosis not present

## 2011-03-17 DIAGNOSIS — M1990 Unspecified osteoarthritis, unspecified site: Secondary | ICD-10-CM | POA: Diagnosis not present

## 2011-03-18 DIAGNOSIS — F331 Major depressive disorder, recurrent, moderate: Secondary | ICD-10-CM | POA: Diagnosis not present

## 2011-03-23 DIAGNOSIS — R197 Diarrhea, unspecified: Secondary | ICD-10-CM | POA: Diagnosis not present

## 2011-03-23 DIAGNOSIS — K219 Gastro-esophageal reflux disease without esophagitis: Secondary | ICD-10-CM | POA: Diagnosis not present

## 2011-03-23 DIAGNOSIS — R05 Cough: Secondary | ICD-10-CM | POA: Diagnosis not present

## 2011-04-15 ENCOUNTER — Encounter: Payer: Self-pay | Admitting: Gynecology

## 2011-04-15 ENCOUNTER — Encounter: Payer: Self-pay | Admitting: Obstetrics and Gynecology

## 2011-04-15 ENCOUNTER — Ambulatory Visit (INDEPENDENT_AMBULATORY_CARE_PROVIDER_SITE_OTHER): Payer: Medicare Other | Admitting: Obstetrics and Gynecology

## 2011-04-15 VITALS — BP 120/78

## 2011-04-15 DIAGNOSIS — F329 Major depressive disorder, single episode, unspecified: Secondary | ICD-10-CM | POA: Insufficient documentation

## 2011-04-15 DIAGNOSIS — N899 Noninflammatory disorder of vagina, unspecified: Secondary | ICD-10-CM | POA: Diagnosis not present

## 2011-04-15 DIAGNOSIS — E119 Type 2 diabetes mellitus without complications: Secondary | ICD-10-CM | POA: Insufficient documentation

## 2011-04-15 DIAGNOSIS — K5792 Diverticulitis of intestine, part unspecified, without perforation or abscess without bleeding: Secondary | ICD-10-CM | POA: Insufficient documentation

## 2011-04-15 DIAGNOSIS — F32A Depression, unspecified: Secondary | ICD-10-CM | POA: Insufficient documentation

## 2011-04-15 DIAGNOSIS — I10 Essential (primary) hypertension: Secondary | ICD-10-CM | POA: Insufficient documentation

## 2011-04-15 DIAGNOSIS — M1990 Unspecified osteoarthritis, unspecified site: Secondary | ICD-10-CM | POA: Insufficient documentation

## 2011-04-15 DIAGNOSIS — N898 Other specified noninflammatory disorders of vagina: Secondary | ICD-10-CM

## 2011-04-15 DIAGNOSIS — J449 Chronic obstructive pulmonary disease, unspecified: Secondary | ICD-10-CM | POA: Insufficient documentation

## 2011-04-15 LAB — WET PREP FOR TRICH, YEAST, CLUE
Clue Cells Wet Prep HPF POC: NONE SEEN
Trich, Wet Prep: NONE SEEN
Yeast Wet Prep HPF POC: NONE SEEN

## 2011-04-15 MED ORDER — TERCONAZOLE 0.8 % VA CREA: TOPICAL_CREAM | VAGINAL | Status: DC

## 2011-04-15 NOTE — Progress Notes (Signed)
Patient came to see me today with a one-week history of external labial  Irritation. She has been on antibiotics twice for her upper respiratory infection and eye  Infection.  .Exam: Kennon Portela present. External: Some irritation seen. Patient very tender on her labia. Vaginal exam: Some discharge. Wet prep only positive for white blood cells and bacteria.  Assessment: Yeast vaginitis in spite of negative wet prep.  Plan: Terconazole 3 cream.

## 2011-04-29 ENCOUNTER — Other Ambulatory Visit: Payer: Self-pay | Admitting: Internal Medicine

## 2011-04-29 DIAGNOSIS — R809 Proteinuria, unspecified: Secondary | ICD-10-CM | POA: Diagnosis not present

## 2011-04-29 DIAGNOSIS — M899 Disorder of bone, unspecified: Secondary | ICD-10-CM | POA: Diagnosis not present

## 2011-04-29 DIAGNOSIS — K219 Gastro-esophageal reflux disease without esophagitis: Secondary | ICD-10-CM | POA: Diagnosis not present

## 2011-04-29 DIAGNOSIS — Z23 Encounter for immunization: Secondary | ICD-10-CM | POA: Diagnosis not present

## 2011-04-29 DIAGNOSIS — F329 Major depressive disorder, single episode, unspecified: Secondary | ICD-10-CM | POA: Diagnosis not present

## 2011-04-29 DIAGNOSIS — Z1231 Encounter for screening mammogram for malignant neoplasm of breast: Secondary | ICD-10-CM

## 2011-04-29 DIAGNOSIS — F17200 Nicotine dependence, unspecified, uncomplicated: Secondary | ICD-10-CM | POA: Diagnosis not present

## 2011-04-29 DIAGNOSIS — I10 Essential (primary) hypertension: Secondary | ICD-10-CM | POA: Diagnosis not present

## 2011-04-29 DIAGNOSIS — E119 Type 2 diabetes mellitus without complications: Secondary | ICD-10-CM | POA: Diagnosis not present

## 2011-04-29 DIAGNOSIS — Z0000 Encounter for general adult medical examination without abnormal findings: Secondary | ICD-10-CM | POA: Diagnosis not present

## 2011-05-01 DIAGNOSIS — E782 Mixed hyperlipidemia: Secondary | ICD-10-CM | POA: Diagnosis not present

## 2011-05-01 DIAGNOSIS — I10 Essential (primary) hypertension: Secondary | ICD-10-CM | POA: Diagnosis not present

## 2011-05-08 ENCOUNTER — Ambulatory Visit
Admission: RE | Admit: 2011-05-08 | Discharge: 2011-05-08 | Disposition: A | Discharge status: Home / Self Care | Payer: Medicare Other | Source: Ambulatory Visit | Attending: Internal Medicine | Admitting: Internal Medicine

## 2011-05-08 DIAGNOSIS — Z1231 Encounter for screening mammogram for malignant neoplasm of breast: Secondary | ICD-10-CM | POA: Diagnosis not present

## 2011-05-13 ENCOUNTER — Encounter: Payer: Medicare Other | Admitting: Obstetrics and Gynecology

## 2011-05-13 DIAGNOSIS — M899 Disorder of bone, unspecified: Secondary | ICD-10-CM | POA: Diagnosis not present

## 2011-05-15 ENCOUNTER — Encounter: Payer: Self-pay | Admitting: Obstetrics and Gynecology

## 2011-05-15 ENCOUNTER — Other Ambulatory Visit (HOSPITAL_COMMUNITY)
Admission: RE | Admit: 2011-05-15 | Discharge: 2011-05-15 | Disposition: A | Discharge status: Home / Self Care | Payer: Medicare Other | Source: Ambulatory Visit | Attending: Obstetrics and Gynecology | Admitting: Obstetrics and Gynecology

## 2011-05-15 ENCOUNTER — Ambulatory Visit (INDEPENDENT_AMBULATORY_CARE_PROVIDER_SITE_OTHER): Payer: Medicare Other | Admitting: Obstetrics and Gynecology

## 2011-05-15 VITALS — BP 134/80 | Ht 65.0 in | Wt 206.0 lb

## 2011-05-15 DIAGNOSIS — Z124 Encounter for screening for malignant neoplasm of cervix: Secondary | ICD-10-CM

## 2011-05-15 DIAGNOSIS — N899 Noninflammatory disorder of vagina, unspecified: Secondary | ICD-10-CM

## 2011-05-15 DIAGNOSIS — N390 Urinary tract infection, site not specified: Secondary | ICD-10-CM

## 2011-05-15 DIAGNOSIS — B373 Candidiasis of vulva and vagina: Secondary | ICD-10-CM

## 2011-05-15 DIAGNOSIS — N898 Other specified noninflammatory disorders of vagina: Secondary | ICD-10-CM

## 2011-05-15 DIAGNOSIS — M949 Disorder of cartilage, unspecified: Secondary | ICD-10-CM

## 2011-05-15 DIAGNOSIS — M899 Disorder of bone, unspecified: Secondary | ICD-10-CM

## 2011-05-15 DIAGNOSIS — R350 Frequency of micturition: Secondary | ICD-10-CM | POA: Diagnosis not present

## 2011-05-15 DIAGNOSIS — M8580 Other specified disorders of bone density and structure, unspecified site: Secondary | ICD-10-CM

## 2011-05-15 LAB — URINALYSIS W MICROSCOPIC + REFLEX CULTURE
Glucose, UA: 250 mg/dL — AB
Hgb urine dipstick: NEGATIVE
Nitrite: NEGATIVE
Protein, ur: 100 mg/dL — AB
Urobilinogen, UA: 0.2 mg/dL (ref 0.0–1.0)

## 2011-05-15 LAB — WET PREP FOR TRICH, YEAST, CLUE
Clue Cells Wet Prep HPF POC: NONE SEEN
Trich, Wet Prep: NONE SEEN

## 2011-05-15 MED ORDER — TERCONAZOLE 0.8 % VA CREA: 1.0000 | TOPICAL_CREAM | Freq: Every day | VAGINAL | Status: AC

## 2011-05-15 NOTE — Patient Instructions (Signed)
Get me a copy of your latest. Bone density.

## 2011-05-15 NOTE — Progress Notes (Signed)
Patient came back to see me today for further followup. One of her concerns is urinary frequency. Her urinalysis today showed bacteria and 2 white blood cells per high-power field. She is also having vaginal yeast symptoms of discharge and itching which she has frequently with her diabetes and use of antibiotics. She is not having urgency of urination, dysuria, or incontinence. She is having no vaginal bleeding. She is having no pelvic pain. She just had a mammogram which was normal. We did a bone density in November 2010 which showed low bone mass without an elevated FRAX risk. She said that recently she had a [facility]up [facility]. She does not know the results. She will get me the  Report.  ROS: 12 system review done. Pertinent positives above. Other positives are depression, asthma, arthritis, hypertension, diabetes mellitus and diverticulitis.  Physical examination: Kennon Portela present HEENT within normal limits. Neck: Thyroid not large. No masses. Supraclavicular nodes: not enlarged. Breasts: Examined in both sitting and lying  position. No skin changes and no masses. Abdomen: Soft no guarding rebound or masses or hernia. Pelvic: External: Within normal limits. BUS: Within normal limits. Vaginal:within normal limits. Good estrogen effect. No evidence of cystocele rectocele or enterocele. Cervix: clean. Uterus: Normal size and shape. Adnexa: No masses. Rectovaginal exam: Confirmatory and negative. Extremities: Within normal limits. Wet prep was negative.  Assessment: #1. Urinary frequency #2. Yeast vaginitis #3. Osteopenia  Plan: In spite of negative wet prep I think she has recurrent yeast. She was treated with terconazole 3 cream. We will defer treatment of a urinary tract infection until I see the results of her culture. She will get me a copy of her bone density. She will continue yearly mammograms.

## 2011-05-19 LAB — URINE CULTURE: Organism ID, Bacteria: NO GROWTH

## 2011-05-20 ENCOUNTER — Telehealth: Payer: Self-pay | Admitting: *Deleted

## 2011-05-20 DIAGNOSIS — F331 Major depressive disorder, recurrent, moderate: Secondary | ICD-10-CM | POA: Diagnosis not present

## 2011-05-20 MED ORDER — CIPROFLOXACIN HCL 250 MG PO TABS: 250.0000 mg | ORAL_TABLET | Freq: Two times a day (BID) | ORAL | Status: AC

## 2011-05-20 NOTE — Telephone Encounter (Signed)
error 

## 2011-05-20 NOTE — Telephone Encounter (Signed)
Spoke with pt regarding the below and she is allergic to Cipro.

## 2011-05-20 NOTE — Telephone Encounter (Signed)
Follow up from 4/26 urine culture results. Pt said this am she started to have frequent urination along with irritation. No blood in urine or foul smell. Please advise

## 2011-05-20 NOTE — Telephone Encounter (Signed)
Pt said she couldn't remember if this was medication or not, rx sent to pharmacy. Told pt to call if any swelling should occur or other problems. Pt okay with this and will do as directed below.

## 2011-05-20 NOTE — Telephone Encounter (Signed)
Treat with Cipro 250 mg twice a day for 5 days. This may cause her yeast infection to recur. If it does have her use terconazole 3 cream to 3 days after the Cipro.

## 2011-05-20 NOTE — Telephone Encounter (Signed)
Addended by: Aura Camps on: 05/20/2011 10:10 AM   Modules accepted: Orders

## 2011-06-03 DIAGNOSIS — E119 Type 2 diabetes mellitus without complications: Secondary | ICD-10-CM | POA: Diagnosis not present

## 2011-06-03 DIAGNOSIS — I10 Essential (primary) hypertension: Secondary | ICD-10-CM | POA: Diagnosis not present

## 2011-06-09 ENCOUNTER — Encounter: Payer: Self-pay | Admitting: Obstetrics and Gynecology

## 2011-06-18 ENCOUNTER — Ambulatory Visit (INDEPENDENT_AMBULATORY_CARE_PROVIDER_SITE_OTHER): Payer: Medicare Other | Admitting: Obstetrics and Gynecology

## 2011-06-18 VITALS — BP 120/70

## 2011-06-18 DIAGNOSIS — R300 Dysuria: Secondary | ICD-10-CM | POA: Diagnosis not present

## 2011-06-18 DIAGNOSIS — L293 Anogenital pruritus, unspecified: Secondary | ICD-10-CM | POA: Diagnosis not present

## 2011-06-18 DIAGNOSIS — N898 Other specified noninflammatory disorders of vagina: Secondary | ICD-10-CM

## 2011-06-18 LAB — WET PREP FOR TRICH, YEAST, CLUE

## 2011-06-18 LAB — URINALYSIS W MICROSCOPIC + REFLEX CULTURE
Glucose, UA: 100 mg/dL — AB
Ketones, ur: NEGATIVE mg/dL
Leukocytes, UA: NEGATIVE
Nitrite: NEGATIVE
Specific Gravity, Urine: 1.025 (ref 1.005–1.030)
pH: 5.5 (ref 5.0–8.0)

## 2011-06-18 MED ORDER — FLUCONAZOLE 150 MG PO TABS: 150.0000 mg | ORAL_TABLET | Freq: Every day | ORAL | Status: AC

## 2011-06-18 NOTE — Progress Notes (Signed)
Patient came to see me today with dysuria and vaginal irritation. She has had similar complaints at the end of April. Initially she had a negative urine culture. Her symptoms persisted and we started her on Cipro. After one dose she had facial swelling and stopped it. The symptoms that brought her today then began. She is having no vaginal bleeding.  Exam: Leslie Gamble present. External shows irritation. BUS within normal limits. Vaginal exam shows a little discharge with negative wet prep. Cathed urine was negative. Please note we cathed patient because she had gone prior to getting to the office and could not go again.  Assessment: Probable persistent yeast vaginitis.  Plan: Diflucan 150 mg daily for 7 days as  she did not get complete relief when she used terconazole recently. Urine culture obtained. We will call with results. Patient did talk to Dr. Lucianne Muss about her hemoglobin A1c and let me know.

## 2011-06-19 ENCOUNTER — Telehealth: Payer: Self-pay

## 2011-06-19 NOTE — Telephone Encounter (Signed)
PT. STATES YOU WANTED HER RECENT HGBA1C RESULT FROM DR. KUMAR AFTER YOU SAW HER 06-18-11. IT IS 6.9

## 2011-06-20 LAB — URINE CULTURE: Organism ID, Bacteria: NO GROWTH

## 2011-06-22 ENCOUNTER — Encounter: Payer: Self-pay | Admitting: Obstetrics and Gynecology

## 2011-06-22 ENCOUNTER — Telehealth: Payer: Self-pay | Admitting: Obstetrics and Gynecology

## 2011-06-22 NOTE — Telephone Encounter (Signed)
I called patient to let her know below info. Patient said she could not take the Diflucan you prescribed.  She said she took one pill and had terrible diarrhea and indigestion. She called her primary care MD Saturday and he told her not to take anymore.  I told her one pill is sometimes enough to treat it.  She said she wanted to wait and see and if symptoms returned she would call us for different RX.  Are you okay with that or want to prescribe now?  (I will add Diflucan to her medication adverse reaction/allery list.)

## 2011-06-22 NOTE — Telephone Encounter (Signed)
Message copied by Keenan Bachelor on Mon Jun 22, 2011  9:46 AM ------      Message from: Trellis Paganini      Created: Sat Jun 20, 2011  6:45 AM       Tell patient she does not have uti. Just has yeast infection which I treated.

## 2011-06-22 NOTE — Telephone Encounter (Signed)
OK to wait

## 2011-07-06 DIAGNOSIS — R197 Diarrhea, unspecified: Secondary | ICD-10-CM | POA: Diagnosis not present

## 2011-07-07 DIAGNOSIS — R197 Diarrhea, unspecified: Secondary | ICD-10-CM | POA: Diagnosis not present

## 2011-07-14 DIAGNOSIS — R109 Unspecified abdominal pain: Secondary | ICD-10-CM | POA: Diagnosis not present

## 2011-08-26 DIAGNOSIS — K219 Gastro-esophageal reflux disease without esophagitis: Secondary | ICD-10-CM | POA: Diagnosis not present

## 2011-08-26 DIAGNOSIS — E119 Type 2 diabetes mellitus without complications: Secondary | ICD-10-CM | POA: Diagnosis not present

## 2011-08-26 DIAGNOSIS — F17200 Nicotine dependence, unspecified, uncomplicated: Secondary | ICD-10-CM | POA: Diagnosis not present

## 2011-08-26 DIAGNOSIS — E669 Obesity, unspecified: Secondary | ICD-10-CM | POA: Diagnosis not present

## 2011-08-26 DIAGNOSIS — F329 Major depressive disorder, single episode, unspecified: Secondary | ICD-10-CM | POA: Diagnosis not present

## 2011-08-26 DIAGNOSIS — I10 Essential (primary) hypertension: Secondary | ICD-10-CM | POA: Diagnosis not present

## 2011-08-26 DIAGNOSIS — G479 Sleep disorder, unspecified: Secondary | ICD-10-CM | POA: Diagnosis not present

## 2011-09-08 DIAGNOSIS — H04129 Dry eye syndrome of unspecified lacrimal gland: Secondary | ICD-10-CM | POA: Diagnosis not present

## 2011-09-08 DIAGNOSIS — H2510 Age-related nuclear cataract, unspecified eye: Secondary | ICD-10-CM | POA: Diagnosis not present

## 2011-09-08 DIAGNOSIS — E119 Type 2 diabetes mellitus without complications: Secondary | ICD-10-CM | POA: Diagnosis not present

## 2011-09-09 DIAGNOSIS — E119 Type 2 diabetes mellitus without complications: Secondary | ICD-10-CM | POA: Diagnosis not present

## 2011-09-09 DIAGNOSIS — F331 Major depressive disorder, recurrent, moderate: Secondary | ICD-10-CM | POA: Diagnosis not present

## 2011-09-11 DIAGNOSIS — I10 Essential (primary) hypertension: Secondary | ICD-10-CM | POA: Diagnosis not present

## 2011-09-17 DIAGNOSIS — J309 Allergic rhinitis, unspecified: Secondary | ICD-10-CM | POA: Diagnosis not present

## 2011-09-17 DIAGNOSIS — M549 Dorsalgia, unspecified: Secondary | ICD-10-CM | POA: Diagnosis not present

## 2011-09-23 DIAGNOSIS — M549 Dorsalgia, unspecified: Secondary | ICD-10-CM | POA: Diagnosis not present

## 2011-09-24 DIAGNOSIS — M722 Plantar fascial fibromatosis: Secondary | ICD-10-CM | POA: Diagnosis not present

## 2011-09-24 DIAGNOSIS — M1710 Unilateral primary osteoarthritis, unspecified knee: Secondary | ICD-10-CM | POA: Diagnosis not present

## 2011-10-15 DIAGNOSIS — J45909 Unspecified asthma, uncomplicated: Secondary | ICD-10-CM | POA: Diagnosis not present

## 2011-10-15 DIAGNOSIS — J309 Allergic rhinitis, unspecified: Secondary | ICD-10-CM | POA: Diagnosis not present

## 2011-10-21 DIAGNOSIS — F331 Major depressive disorder, recurrent, moderate: Secondary | ICD-10-CM | POA: Diagnosis not present

## 2011-10-26 ENCOUNTER — Ambulatory Visit (INDEPENDENT_AMBULATORY_CARE_PROVIDER_SITE_OTHER): Payer: Medicare Other | Admitting: Gynecology

## 2011-10-26 ENCOUNTER — Encounter: Payer: Self-pay | Admitting: Gynecology

## 2011-10-26 VITALS — BP 130/76

## 2011-10-26 DIAGNOSIS — B9689 Other specified bacterial agents as the cause of diseases classified elsewhere: Secondary | ICD-10-CM

## 2011-10-26 DIAGNOSIS — N898 Other specified noninflammatory disorders of vagina: Secondary | ICD-10-CM | POA: Diagnosis not present

## 2011-10-26 DIAGNOSIS — A499 Bacterial infection, unspecified: Secondary | ICD-10-CM

## 2011-10-26 DIAGNOSIS — R300 Dysuria: Secondary | ICD-10-CM

## 2011-10-26 DIAGNOSIS — N760 Acute vaginitis: Secondary | ICD-10-CM

## 2011-10-26 LAB — URINALYSIS W MICROSCOPIC + REFLEX CULTURE
Crystals: NONE SEEN
Leukocytes, UA: NEGATIVE
Nitrite: NEGATIVE
Specific Gravity, Urine: 1.025 (ref 1.005–1.030)
Urobilinogen, UA: 1 mg/dL (ref 0.0–1.0)
pH: 6 (ref 5.0–8.0)

## 2011-10-26 LAB — WET PREP FOR TRICH, YEAST, CLUE

## 2011-10-26 MED ORDER — LIDOCAINE HCL 2 % EX GEL: CUTANEOUS | Status: DC | PRN

## 2011-10-26 MED ORDER — TINIDAZOLE 500 MG PO TABS: ORAL_TABLET | ORAL | Status: DC

## 2011-10-26 NOTE — Patient Instructions (Signed)
Patient information: Bacterial vaginosis (Beyond the Basics)  Author Antonietta Breach, MD Section Editor Lysbeth Galas, MD Deputy Editor Raye Sorrow, MD Disclosures  All topics are updated as new evidence becomes available and our peer review process is complete.  Literature review current through: Sep 2013.  This topic last updated: Sep 15, 2011.  INTRODUCTION - Bacterial vaginosis (BV) is the most common cause of vaginal discharge in women. It can cause bothersome symptoms, and also increases the risk of acquiring serious sexually transmitted infections, such as HIV. It may be difficult to know if discharge is caused by BV or other common vaginal infections, thus a visit with a healthcare provider is recommended in most cases. (See "Patient information: Vaginal discharge in adult women (Beyond the Basics)".) BACTERIAL VAGINOSIS CAUSES - BV occurs when there is a change in the number and types of bacteria in the vagina. Lactobacilli are a type of bacteria that are normally found in the vagina. In women with BV, the number of lactobacilli is reduced. The reason for these changes is not known. Risk factors - Risk factors for BV include multiple or new sexual partners, douching, and cigarette smoking. Although sexual activity can increase the risk of developing BV, BV can occur in women who have never had vaginal intercourse. BV is now thought to be a sexually transmitted infection, although most recurrences are not sexually related. BACTERIAL VAGINOSIS SIGNS AND SYMPTOMS - Approximately 50 to 75 percent of women with BV have no symptoms. Those with symptoms often note an unpleasant, "fishy smelling" vaginal discharge that is more noticeable after sexual intercourse. Vaginal discharge that is off-white and thin may also be present. Some patients have itching. Pain during urination or sex, redness, and swelling are not typical. If you have concerns about excessive or foul-smelling vaginal discharge,  abnormal bleeding, or vulvar irritation, see a healthcare provider. Self-treatment with over-the-counter products (eg, yeast creams, deodorants) is not recommended without a definite diagnosis. BACTERIAL VAGINOSIS DIAGNOSIS - The diagnosis of BV is based upon a physical examination and laboratory testing. The physical examination usually includes a pelvic examination, which allows the healthcare provider to observe and test vaginal secretions. It can be difficult to know, without an examination and testing, if vaginal discharged is caused by BV or another vaginal infection. You should insist that your provider confirm the diagnosis with appropriate tests. BACTERIAL VAGINOSIS COMPLICATIONS - BV itself is not harmful, although it has been associated with some health problems. Pregnant women with BV are at higher risk of preterm delivery (see 'Bacterial vaginosis and pregnancy' below).  Untreated BV in a woman who undergoes hysterectomy or abortion can lead to infection of the surgical site.  BV increases the risk of becoming infected with and spreading HIV.  BV increases the risk that a woman will become infected with genital herpes, gonorrhea, or chlamydia. (See "Patient information: Genital herpes (Beyond the Basics)" and "Patient information: Gonorrhea (Beyond the Basics)" and "Patient information: Chlamydia (Beyond the Basics)".) BACTERIAL VAGINOSIS TREATMENT - Treatment of BV is usually recommended. There are two prescription medications used for the treatment of BV: metronidazole and clindamycin. Both medications can be taken in pill form by mouth, or with a gel or cream that is inserted inside the vagina. Oral medication may be more convenient, but causes more side effects. If symptoms improve after treatment, a follow up visit is not necessary. Metronidazole - Metronidazole vaginal gel is one of the most effective treatments; it is applied inside the vagina at  bedtime for five days. Metronidazole can  also be taken in pill form, 500 mg twice daily for seven days. The choice of pill versus vaginal gel depends upon the woman's preference. In general, there are fewer side effects with the vaginal treatment. Side effects of oral metronidazole include a metallic taste, nausea, and a temporary lowered blood count. You should not drink alcohol while taking metronidazole pills due to the risk of a serious interaction, which can cause flushing, nausea, thirst, palpitations, chest pain, vertigo, and low blood pressure. Metronidazole pills also interact with warfarin (Coumadin), potentially increasing the risk of bleeding. The vaginal gel does not cause these side effects. Clindamycin - Clindamycin is a cream that is inserted into the vagina at bedtime for seven days. A one-day vaginal clindamycin cream and three day vaginal ovule are also available. Clindamycin cream should not be used with latex condoms due to the risk of condom breakage. Clindamycin can also be taken by mouth, 300 mg twice daily for seven days. Sexual partners - Treating the sexual partner does not improve the woman's symptoms or decrease the risk of the infection coming back, hence treatment of female sexual partners is not recommended. Relapse and recurrent infection - Approximately 30 percent of women who initially improve after treatment have a recurrence of BV symptoms within three months, and more than 50 percent have a recurrence of symptoms within 12 months. It is not clear why this occurs, although it may be related to bacteria that were not completely treated or lack of a normal level of protective lactobacilli. The role of lactobacilli is discussed above (see 'Bacterial vaginosis causes' above). Relapse can be treated with a prolonged course of oral or vaginal metronidazole or clindamycin for seven days; the [employer] for Disease Control and Prevention suggests a treatment regimen different from the initial or previous treatment  regimen (eg, oral treatment if vaginal treatment used previously). If you've had more than three episodes of BV in the past 12 months, you may benefit from a preventive treatment. This may include vaginal metronidazole gel twice weekly for three to six months. Clindamycin (oral or vaginal) is not usually recommended as a preventive treatment. Bacterial vaginosis and pregnancy - Pregnant women with BV are at increased risk of preterm birth. However, there is no benefit to testing and/or treating all pregnant women for BV unless the woman has symptoms of infection. Some experts recommend testing only pregnant women who have a history of a previous preterm delivery. Pregnant women with symptoms of BV infection are usually treated. Oral treatment with seven days of metronidazole is preferred over vaginal treatments. BACTERIAL VAGINOSIS PREVENTION - The best way to prevent BV is not known. However, a few basic recommendations can be made. Do not douche. Douching is the use of a solution to rinse the inside of the vagina. Some women douche to feel "clean", although there is no proven benefit of douching. The vagina is normally able to maintain a healthy balance of bacteria; douching can upset this balance and potentially flush harmful bacteria into the upper genital tracts (uterus, fallopian tubes).  Limit the number of sexual partners. Women with multiple sexual partners are at higher risk of developing bacterial vaginosis and sexually transmitted infections.  Finish the entire course of treatment for BV, even if the symptoms resolve after a few doses.  Use of birth control pills may be helpful; however, use of condoms is advised for female partners of women with recurrent BV. SUMMARY Bacterial vaginosis (BV)  can cause "fishy smelling" vaginal discharge, which may be worse after sex. Some women do not have this discharge.  BV is considered by some experts to be a sexually transmitted infection. Sexual partners  do not need to be treated since treatment of males is not effective for preventing infection of the female partner. Some experts recommend that female partners use condoms. Female partners should be treated with standard therapy.  Do not treat yourself for abnormal vaginal discharge. A doctor or nurse should first perform an exam to determine the reason for the discharge.  Several prescription medications are available to treat BV; some are vaginal gels or creams while others are pills that you take by mouth. Pills may be more convenient, but usually cause side effects (nausea, metallic-taste).  Some women develop BV repeatedly. A treatment may be recommended to prevent infections. This includes a vaginal gel twice per week for three to six months.  Pregnant women with BV infection should be treated. This usually includes pills that are taken by mouth.

## 2011-10-26 NOTE — Progress Notes (Addendum)
Patient is a 71 year old presented to the office today complaining of burning in the vaginal area and some odor she thought that was slightly white. No true pruritus. Patient isn't sexually active. She is completing a seven-day course of antibiotics she does not recall for sinus infection. She has a multitude of allergies this can be seen and the allergy list. She did state that when she wipes after urination its irritated but true dysuria she does not have. She denies any flank tenderness fever chills nausea or vomiting.  Exam: Bartholin urethra Skene was: Atrophic changes tender on palpation that labia majora but no lesions seen. Vagina: Slight creamy discharge with a fishy odor Cervix: No lesions or discharge Rectal exam: Not done  Urinalysis was essentially negative trace ketones and some protein.  Wet prep closed cells were noted along with 2 numerous to count bacteria and a few white blood cells  Assessment/plan: Bacterial vaginosis patient will be placed on tender mass 1 g by mouth daily for 5 days. I have told her to take 500 mg tablet twice a day and not on an empty stomach since she has a very sensitive GI tract. She will be given a prescription of 2% Xylocaine gel that she can apply externally to alleviate some of the rotation that she has experienced.

## 2011-11-06 ENCOUNTER — Ambulatory Visit (INDEPENDENT_AMBULATORY_CARE_PROVIDER_SITE_OTHER): Payer: Medicare Other | Admitting: Women's Health

## 2011-11-06 ENCOUNTER — Encounter: Payer: Self-pay | Admitting: Women's Health

## 2011-11-06 ENCOUNTER — Ambulatory Visit (INDEPENDENT_AMBULATORY_CARE_PROVIDER_SITE_OTHER): Payer: Medicare Other

## 2011-11-06 VITALS — BP 126/70

## 2011-11-06 DIAGNOSIS — N898 Other specified noninflammatory disorders of vagina: Secondary | ICD-10-CM

## 2011-11-06 DIAGNOSIS — N949 Unspecified condition associated with female genital organs and menstrual cycle: Secondary | ICD-10-CM

## 2011-11-06 DIAGNOSIS — R102 Pelvic and perineal pain: Secondary | ICD-10-CM

## 2011-11-06 DIAGNOSIS — R1031 Right lower quadrant pain: Secondary | ICD-10-CM | POA: Diagnosis not present

## 2011-11-06 DIAGNOSIS — R82998 Other abnormal findings in urine: Secondary | ICD-10-CM | POA: Diagnosis not present

## 2011-11-06 LAB — WET PREP FOR TRICH, YEAST, CLUE: Clue Cells Wet Prep HPF POC: NONE SEEN

## 2011-11-06 LAB — URINALYSIS W MICROSCOPIC + REFLEX CULTURE
Crystals: NONE SEEN
Hgb urine dipstick: NEGATIVE
Ketones, ur: NEGATIVE mg/dL
Nitrite: NEGATIVE
Specific Gravity, Urine: 1.015 (ref 1.005–1.030)
Urobilinogen, UA: 0.2 mg/dL (ref 0.0–1.0)

## 2011-11-06 NOTE — Progress Notes (Signed)
Patient ID: SHARRONDA JAKUBCZAK, female   DOB: November 19, 1940, 71 y.o.   MRN: [id number] Presents with complaint of vague right lower quadrant pain/vaginal pressure, occasional stabbing type pain, rates a 7 over the last few days. Was seen last week, had a negative urine and was treated for BV with Tindamax. Denies any pain or burning with urination but states has had some increased frequency. Denies pain at the end of stream. Denies any nausea or fever. States had some nausea last week. History of IBS and diverticulitis.  Exam: No CVAT, Abdomen soft, nontender, no radiation or rebound of pain. External genitalia within normal limits, speculum exam slight vaginal atrophy, minimal discharge, wet prep negative. Bimanual exam no CMT, uterus small, no adnexal fullness or tenderness. No pain with exam. UA: trace leukocytes, 7-10 WBCs, rare bacteria. Ultrasound: No abnormalities seen. Normal uterus and ovaries. No free fluid seen. Endometrium 1.5 mm.  Right lower quadrant pain/vaginal discomfort with pressure  Plan: Urine culture pending. Encouraged to eat a bland diet, followup with primary care if pain persists.

## 2011-11-09 DIAGNOSIS — J309 Allergic rhinitis, unspecified: Secondary | ICD-10-CM | POA: Diagnosis not present

## 2011-11-09 LAB — URINE CULTURE

## 2011-11-27 DIAGNOSIS — M549 Dorsalgia, unspecified: Secondary | ICD-10-CM | POA: Diagnosis not present

## 2011-11-27 DIAGNOSIS — J069 Acute upper respiratory infection, unspecified: Secondary | ICD-10-CM | POA: Diagnosis not present

## 2011-12-10 DIAGNOSIS — R05 Cough: Secondary | ICD-10-CM | POA: Diagnosis not present

## 2011-12-25 DIAGNOSIS — E782 Mixed hyperlipidemia: Secondary | ICD-10-CM | POA: Diagnosis not present

## 2011-12-25 DIAGNOSIS — I10 Essential (primary) hypertension: Secondary | ICD-10-CM | POA: Diagnosis not present

## 2011-12-29 DIAGNOSIS — J45909 Unspecified asthma, uncomplicated: Secondary | ICD-10-CM | POA: Diagnosis not present

## 2011-12-29 DIAGNOSIS — F329 Major depressive disorder, single episode, unspecified: Secondary | ICD-10-CM | POA: Diagnosis not present

## 2011-12-29 DIAGNOSIS — E119 Type 2 diabetes mellitus without complications: Secondary | ICD-10-CM | POA: Diagnosis not present

## 2011-12-29 DIAGNOSIS — F17200 Nicotine dependence, unspecified, uncomplicated: Secondary | ICD-10-CM | POA: Diagnosis not present

## 2011-12-29 DIAGNOSIS — Z23 Encounter for immunization: Secondary | ICD-10-CM | POA: Diagnosis not present

## 2011-12-29 DIAGNOSIS — K219 Gastro-esophageal reflux disease without esophagitis: Secondary | ICD-10-CM | POA: Diagnosis not present

## 2011-12-29 DIAGNOSIS — I10 Essential (primary) hypertension: Secondary | ICD-10-CM | POA: Diagnosis not present

## 2011-12-30 ENCOUNTER — Ambulatory Visit (INDEPENDENT_AMBULATORY_CARE_PROVIDER_SITE_OTHER): Payer: Medicare Other | Admitting: Internal Medicine

## 2011-12-30 ENCOUNTER — Encounter: Payer: Self-pay | Admitting: Internal Medicine

## 2011-12-30 VITALS — BP 114/60 | HR 82 | Temp 97.6°F | Ht 66.0 in | Wt 192.0 lb

## 2011-12-30 DIAGNOSIS — J45909 Unspecified asthma, uncomplicated: Secondary | ICD-10-CM

## 2011-12-30 DIAGNOSIS — F17200 Nicotine dependence, unspecified, uncomplicated: Secondary | ICD-10-CM | POA: Insufficient documentation

## 2011-12-30 DIAGNOSIS — E669 Obesity, unspecified: Secondary | ICD-10-CM

## 2011-12-30 LAB — PULMONARY FUNCTION TEST

## 2011-12-30 MED ORDER — MOMETASONE FURO-FORMOTEROL FUM 100-5 MCG/ACT IN AERO: INHALATION_SPRAY | RESPIRATORY_TRACT | Status: DC

## 2011-12-30 MED ORDER — FAMOTIDINE 20 MG PO TABS: ORAL_TABLET | ORAL | Status: DC

## 2011-12-30 NOTE — Patient Instructions (Addendum)
nexium 40 mg Take 30-60 min before first meal of the day and pepcid 20 mg one at bedtime (I called this in to your pharmacy)  Stop symbicort and start dulera 100 Take 2 puffs first thing in am and then another 2 puffs about 12 hours later.    only take methylprednisolone if not improving on dulera  The key is to stop smoking completely before smoking completely stops you- it's not too late  Work on inhaler technique:  relax and gently blow all the way out then take a nice smooth deep breath back in, triggering the inhaler at same time you start breathing in.  Hold for up to 5 seconds if you can.  Rinse and gargle with water when done   If your mouth or throat starts to bother you,   I suggest you time the inhaler to your dental care and after using the inhaler(s) brush teeth and tongue with a baking soda containing toothpaste and when you rinse this out, gargle with it first to see if this helps your mouth and throat.     Please schedule a follow up office visit in 4 weeks, sooner if needed  Late add needs cxr on return

## 2011-12-30 NOTE — Progress Notes (Signed)
PFT done today. 

## 2011-12-30 NOTE — Assessment & Plan Note (Addendum)
-   hfa 75% p coaching 12/30/2011  - PFT's 12/30/2011 FEV1  1.55 (70%) ratio 63 with DLCO 63 corrects to 122  GOLD II and still smoking (discussed separately) with very poor baseline hfa and cough >> sob so need to be careful with over treating the airways with inhalers that may actually provoke the cough  The proper method of use, as well as anticipated side effects, of a metered-dose inhaler are discussed and demonstrated to the patient. Improved effectiveness after extensive coaching during this visit to a level of approximately  75% from a baseline of < 10%  Add gerd rx until can get the cough controlled then regroup after the holidays

## 2011-12-30 NOTE — Assessment & Plan Note (Signed)

## 2011-12-30 NOTE — Progress Notes (Signed)
  Subjective:    Patient ID: Leslie Gamble, female    DOB: 20-Jul-1940 MRN: [id number]  HPI  30 yobf smoker with resp problems consisting of mostly nasal issues starting in childhood and followed by Elmon Kirschner referred 12/30/2011  by Dr Eula Listen for eval of cough.   12/30/2011 1st pulmonary eval / Sherene Sires for new onset cough and sob requiring inhalers x 2 months where didn't need them before.  Cough productive thick mucus x white worse when lying down but pretty much continuous all day to point where she looses her breath at rest.  Otherwise also sob only with exertion like getting in a hurry or up a flight of steps - no better with inhalers to date bu hfa technique very poor (see below).  Once asleepdoes  ok without nocturnal  or early am exacerbation  of respiratory  c/o's or need for noct saba. Also denies any obvious fluctuation of symptoms with weather or environmental changes or other aggravating or alleviating factors except as outlined above.   Review of Systems  Constitutional: Positive for chills. Negative for fever and unexpected weight change.  HENT: Positive for congestion, rhinorrhea, postnasal drip and sinus pressure. Negative for ear pain, nosebleeds, sore throat, sneezing, trouble swallowing and dental problem.   Eyes: Positive for itching. Negative for redness.  Respiratory: Positive for shortness of breath. Negative for cough, chest tightness and wheezing.   Cardiovascular: Negative for palpitations and leg swelling.  Gastrointestinal: Negative for nausea and vomiting.  Genitourinary: Negative for dysuria.  Musculoskeletal: Negative for joint swelling.  Skin: Negative for rash.  Neurological: Negative for headaches.  Hematological: Bruises/bleeds easily.  Psychiatric/Behavioral: Positive for dysphoric mood. The patient is nervous/anxious.        Objective:   Physical Exam  Obese bf with harsh cough. Wt Readings from Last 3 Encounters:  12/30/11 192 lb (87.091 kg)   05/15/11 206 lb (93.441 kg)     HEENT: nl dentition, turbinates, and orophanx. Nl external ear canals without cough reflex   NECK :  without JVD/Nodes/TM/ nl carotid upstrokes bilaterally   LUNGS: no acc muscle use, clear to A and P bilaterally without cough on insp or exp maneuvers   CV:  RRR  no s3 or murmur or increase in P2, no edema   ABD:  soft and nontender with nl excursion in the supine position. No bruits or organomegaly, bowel sounds nl  MS:  warm without deformities, calf tenderness, cyanosis or clubbing  SKIN: warm and dry without lesions    NEURO:  alert, approp, no deficits            Assessment & Plan:

## 2012-01-29 ENCOUNTER — Ambulatory Visit (INDEPENDENT_AMBULATORY_CARE_PROVIDER_SITE_OTHER): Payer: Medicare Other | Admitting: Internal Medicine

## 2012-01-29 ENCOUNTER — Ambulatory Visit (INDEPENDENT_AMBULATORY_CARE_PROVIDER_SITE_OTHER)
Admission: RE | Admit: 2012-01-29 | Discharge: 2012-01-29 | Disposition: A | Discharge status: Home / Self Care | Payer: Medicare Other | Source: Ambulatory Visit | Attending: Internal Medicine | Admitting: Internal Medicine

## 2012-01-29 ENCOUNTER — Encounter: Payer: Self-pay | Admitting: Internal Medicine

## 2012-01-29 VITALS — BP 112/64 | HR 89 | Temp 98.4°F | Ht 65.0 in | Wt 195.0 lb

## 2012-01-29 DIAGNOSIS — J449 Chronic obstructive pulmonary disease, unspecified: Secondary | ICD-10-CM

## 2012-01-29 DIAGNOSIS — F17200 Nicotine dependence, unspecified, uncomplicated: Secondary | ICD-10-CM | POA: Diagnosis not present

## 2012-01-29 DIAGNOSIS — J4489 Other specified chronic obstructive pulmonary disease: Secondary | ICD-10-CM

## 2012-01-29 MED ORDER — PREDNISONE (PAK) 10 MG PO TABS: ORAL_TABLET | ORAL | Status: DC

## 2012-01-29 NOTE — Progress Notes (Signed)
Quick Note:  Spoke with pt and notified of results per Dr. Wert. Pt verbalized understanding and denied any questions.  ______ 

## 2012-01-29 NOTE — Patient Instructions (Addendum)
nexium 40 mg Take 30-60 min before first meal of the day and pepcid 20 mg one at bedtime (I called this in to your pharmacy)  Elwin Sleight 100 Take 2 puffs first thing in am and then another 2 puffs about 12 hours later.   Prednisone 10 mg take  4 each am x 2 days,   2 each am x 2 days,  1 each am x2days and stop   The key is to stop smoking completely before smoking completely stops you- it's not too late  Work on inhaler technique:  relax and gently blow all the way out then take a nice smooth deep breath back in, triggering the inhaler at same time you start breathing in.  Hold for up to 5 seconds if you can.  Rinse and gargle with water when done   If your mouth or throat starts to bother you,   I suggest you time the inhaler to your dental care and after using the inhaler(s) brush teeth and tongue with a baking soda containing toothpaste and when you rinse this out, gargle with it first to see if this helps your mouth and throat.     Please remember to go to the x-ray department downstairs for your tests - we will call you with the results when they are available.  Please schedule a follow up office visit in 4 weeks, sooner if needed

## 2012-01-29 NOTE — Progress Notes (Signed)
  Subjective:    Patient ID: Leslie Gamble, female    DOB: [birth year]  MRN: [id number]  HPI  14 yobf smoker with resp problems consisting of mostly nasal issues starting in childhood and followed by Elmon Kirschner referred 12/30/2011  by Dr Eula Listen for eval of cough.   12/30/2011 1st pulmonary eval / Leslie Gamble for new onset cough and sob requiring inhalers x 2 months where didn't need them before.  Cough productive thick mucus x white worse when lying down but pretty much continuous all day to point where she looses her breath at rest.  Otherwise also sob only with exertion like getting in a hurry or up a flight of steps - no better with inhalers to date bu hfa technique very poor (see below). rec Stop symbicort and start dulera 100 Take 2 puffs first thing in am and then another 2 puffs about 12 hours later.  only take methylprednisolone if not improving on dulera The key is to stop smoking completely before smoking completely stops you- it's not too late Work on inhaler technique   01/29/2012 f/u ov/ Leslie Gamble/ still smoking, some better p last ov then indolent onset progressively worse cough, SOB, and congesiton x 3 days. Cough is prod with large amounts of white sputum.  Dulera on zero and she's not sure how long. Using more saba than baseline x 3 days. Did not take medrol.  Once asleep does  ok without nocturnal  or early am exacerbation  of respiratory  c/o's or need for noct saba. Also denies any obvious fluctuation of symptoms with weather or environmental changes or other aggravating or alleviating factors except as outlined above.  ROS  The following are not active complaints unless bolded sore throat, dysphagia, dental problems, itching, sneezing,  nasal congestion or excess/ purulent secretions, ear ache,   fever, chills, sweats, unintended wt loss, pleuritic or exertional cp, hemoptysis,  orthopnea pnd or leg swelling, presyncope, palpitations, heartburn, abdominal pain, anorexia, nausea,  vomiting, diarrhea  or change in bowel or urinary habits, change in stools or urine, dysuria,hematuria,  rash, arthralgias, visual complaints, headache, numbness weakness or ataxia or problems with walking or coordination,  change in mood/affect or memory.             Objective:   Physical Exam  Obese bf with harsh cough.  Wt 195 01/29/2012   Wt Readings from Last 3 Encounters:  12/30/11 192 lb (87.091 kg)  05/15/11 206 lb (93.441 kg)     HEENT: nl dentition, turbinates, and orophanx. Nl external ear canals without cough reflex   NECK :  without JVD/Nodes/TM/ nl carotid upstrokes bilaterally   LUNGS: no acc muscle use, clear to A and P bilaterally without cough on insp or exp maneuvers   CV:  RRR  no s3 or murmur or increase in P2, no edema   ABD:  soft and nontender with nl excursion in the supine position. No bruits or organomegaly, bowel sounds nl  MS:  warm without deformities, calf tenderness, cyanosis or clubbing  SKIN: warm and dry without lesions       CXR  01/29/2012 : No acute abnormality.               Assessment & Plan:

## 2012-01-30 NOTE — Assessment & Plan Note (Addendum)
-   hfa 75% p coaching 01/30/12 - PFT's 12/30/2011 FEV1  1.55 (70%) ratio 63 with DLCO 63 corrects to 122  GOLD II and still smoking with poor control of symptoms (cough > sob)  and baseline use of mdi  The proper method of use, as well as anticipated side effects, of a metered-dose inhaler are discussed and demonstrated to the patient. Improved effectiveness after extensive coaching during this visit to a level of approximately  75% but not better.   Smoking discussed separately  Because of severity of cough prefer low dose dulera and will also add max gerd rx

## 2012-01-30 NOTE — Assessment & Plan Note (Signed)
>   3 min  I reviewed the Flethcher curve with patient that basically indicates  if you quit smoking when your best day FEV1 is still well preserved (as hers is) it is highly unlikely you will progress to severe disease and informed the patient there was no medication on the market that has proven to change the curve or the likelihood of progression.  Therefore stopping smoking and maintaining abstinence is the most important aspect of care, not choice of inhalers or for that matter, doctors.

## 2012-02-03 ENCOUNTER — Ambulatory Visit: Payer: Medicare Other | Admitting: Internal Medicine

## 2012-02-04 ENCOUNTER — Ambulatory Visit (INDEPENDENT_AMBULATORY_CARE_PROVIDER_SITE_OTHER): Payer: Medicare Other | Admitting: Internal Medicine

## 2012-02-04 ENCOUNTER — Encounter: Payer: Self-pay | Admitting: Internal Medicine

## 2012-02-04 VITALS — BP 122/78 | HR 78 | Temp 97.6°F | Ht 66.0 in | Wt 194.6 lb

## 2012-02-04 DIAGNOSIS — R05 Cough: Secondary | ICD-10-CM

## 2012-02-04 MED ORDER — TRAMADOL HCL 50 MG PO TABS: ORAL_TABLET | ORAL | Status: DC

## 2012-02-04 NOTE — Progress Notes (Signed)
Subjective:    Patient ID: Leslie Gamble, female    DOB: Mar 25, 1940  MRN: [id number]  HPI  85 yobf smoker with resp problems consisting of mostly nasal issues starting in childhood and followed by Leslie Gamble referred 12/30/2011  by Dr Leslie Gamble for eval of cough.   12/30/2011 1st pulmonary eval / Leslie Gamble for new onset cough and sob requiring inhalers x 2 months where didn't need them before.  Cough productive thick mucus x white worse when lying down but pretty much continuous all day to point where she looses her breath at rest.  Otherwise also sob only with exertion like getting in a hurry or up a flight of steps - no better with inhalers to date bu hfa technique very poor (see below). rec Stop symbicort and start dulera 100 Take 2 puffs first thing in am and then another 2 puffs about 12 hours later.  only take methylprednisolone if not improving on dulera The key is to stop smoking completely before smoking completely stops you- it's not too late Work on inhaler technique   01/29/2012 f/u ov/ Leslie Gamble/ still smoking, some better p last ov then indolent onset progressively worse cough, SOB, and congesiton x 3 days. Cough is prod with large amounts of white sputum.  Dulera on zero and she's not sure how long. Using more saba than baseline x 3 days. Did not take medrol. rec nexium 40 mg Take 30-60 min before first meal of the day and pepcid 20 mg one at bedtime (I called this in to your pharmacy) Leslie Gamble 100 Take 2 puffs first thing in am and then another 2 puffs about 12 hours later.  Prednisone 10 mg take  4 each am x 2 days,   2 each am x 2 days,  1 each am x2days and stop  The key is to stop smoking completely   02/04/2012 f/u ov/Leslie Gamble cc Pt c/o hoarseness and "deeper" coughing in order to remove phlegm from lungs. Pt would like FLU VACCINE if okay.  Does not actually bring up excess mucus.  No obvious daytime variabilty or assoc   cp or chest tightness, subjective wheeze overt sinus or hb  symptoms. No unusual exp hx  Once asleep does  ok without nocturnal  or early am exacerbation  of respiratory  c/o's or need for noct saba. Also denies any obvious fluctuation of symptoms with weather or environmental changes or other aggravating or alleviating factors except as outlined above.  ROS  The following are not active complaints unless bolded sore throat, dysphagia, dental problems, itching, sneezing,  nasal congestion or excess/ purulent secretions, ear ache,   fever, chills, sweats, unintended wt loss, pleuritic or exertional cp, hemoptysis,  orthopnea pnd or leg swelling, presyncope, palpitations, heartburn, abdominal pain, anorexia, nausea, vomiting, diarrhea  or change in bowel or urinary habits, change in stools or urine, dysuria,hematuria,  rash, arthralgias, visual complaints, headache, numbness weakness or ataxia or problems with walking or coordination,  change in mood/affect or memory.             Objective:   Physical Exam  Obese bf with harsh upper airway cough.  Wt 195 01/29/2012  > 194 02/04/2012   Wt Readings from Last 3 Encounters:  12/30/11 192 lb (87.091 kg)  05/15/11 206 lb (93.441 kg)     HEENT: nl dentition, turbinates, and orophanx. Nl external ear canals without cough reflex   NECK :  without JVD/Nodes/TM/ nl carotid upstrokes bilaterally   LUNGS: no  acc muscle use, clear to A and P bilaterally without cough on insp or exp maneuvers   CV:  RRR  no s3 or murmur or increase in P2, no edema   ABD:  soft and nontender with nl excursion in the supine position. No bruits or organomegaly, bowel sounds nl  MS:  warm without deformities, calf tenderness, cyanosis or clubbing  SKIN: warm and dry without lesions       CXR  01/29/2012 : No acute abnormality.               Assessment & Plan:

## 2012-02-04 NOTE — Assessment & Plan Note (Signed)
Of the three most common causes of chronic cough, only one (GERD)  can actually cause the other two (asthma and post nasal drip syndrome)  and perpetuate the cylce of cough inducing airway trauma, inflammation, heightened sensitivity to reflux which is prompted by the cough itself via a cyclical mechanism.    This may partially respond to steroids and look like asthma and post nasal drainage but never erradicated completely unless the cough and the secondary reflux are eliminated, preferably both at the same time.  While not intuitively obvious, many patients with chronic low grade reflux do not cough until there is a secondary insult that disturbs the protective epithelial barrier and exposes sensitive nerve endings.  This can be viral or direct physical injury such as with an endotracheal tube.   The point is that once this occurs, it is difficult to eliminate using anything but a maximally effective acid suppression regimen at least in the short run, accompanied by an appropriate diet to address non acid GERD.   Pt struggling with concept of medication reconciliation.  To keep things simple, I have asked the patient to first separate medicines that are perceived as maintenance, that is to be taken daily "no matter what", from those medicines that are taken on only on an as-needed basis and I have given the patient examples of both, and then return to see our NP to generate a  detailed  medication calendar which should be followed until the next physician sees the patient and updates it.

## 2012-02-04 NOTE — Patient Instructions (Addendum)
Take delsym two tsp every 12 hours and supplement if needed with  tramadol 50 mg up to 2 every 4 hours to suppress the urge to cough. Swallowing water or using ice chips/non mint and menthol containing candies (such as lifesavers or sugarless jolly ranchers) are also effective.  You should rest your voice and avoid activities that you know make you cough.  Once you have eliminated the cough for 3 straight days try reducing the tramadol first,  then the delsym as tolerated.    GERD (REFLUX)  is an extremely common cause of respiratory symptoms, many times with no significant heartburn at all.    It can be treated with medication, but also with lifestyle changes including avoidance of late meals, excessive alcohol, smoking cessation, and avoid fatty foods, chocolate, peppermint, colas, red wine, and acidic juices such as orange juice.  NO MINT OR MENTHOL PRODUCTS SO NO COUGH DROPS  USE SUGARLESS CANDY INSTEAD (jolley ranchers or Stover's)  NO OIL BASED VITAMINS - use powdered substitutes.    See Tammy NP w/in 2 weeks with all your medications, even over the counter meds, separated in two separate bags, the ones you take no matter what vs the ones you stop once you feel better and take only as needed when you feel you need them.   Tammy  will generate for you a new user friendly medication calendar that will put [place] all on the same page re: your medication use.     Without this process, it simply isn't possible to assure that we are providing  your outpatient care  with  the attention to detail we feel you deserve.   If we cannot assure that you're getting that kind of care,  then we cannot manage your problem effectively from this clinic.  Once you have seen Tammy and we are sure that we're all on the same page with your medication use she will arrange follow up with me.

## 2012-02-05 ENCOUNTER — Telehealth: Payer: Self-pay | Admitting: Internal Medicine

## 2012-02-05 NOTE — Telephone Encounter (Signed)
Advise patient can try OTC loratadine 10 mg daily as needed during the day, and benadryl 50 mg as needed at night until her swelling resolves.

## 2012-02-05 NOTE — Telephone Encounter (Signed)
PT took one dose of Tramadol yesterday afternoon and within 3 hours noticed lip and nose swelling.  When questioned pt about med allergies she confirmed that she is allergic to Codeine with similar reaction in past.  Codeine allergy was added to current list.  Pt reports that swelling is still there today but has not gotten any worse.  Pt reports that she has not taken any Benadryl etc..  Pt wanted to ask Dr Sherene Sires first.  Advised pt not to take anymore Tramadol.  Please advise of any other instructions.

## 2012-02-05 NOTE — Telephone Encounter (Signed)
Spoke with pt and notified of recs per VS She verbalized understanding and states nothing further needed 

## 2012-02-19 ENCOUNTER — Encounter: Payer: Self-pay | Admitting: Adult Health

## 2012-02-19 ENCOUNTER — Ambulatory Visit (INDEPENDENT_AMBULATORY_CARE_PROVIDER_SITE_OTHER): Payer: Medicare Other | Admitting: Adult Health

## 2012-02-19 VITALS — BP 126/74 | HR 90 | Temp 98.1°F | Ht 66.0 in | Wt 196.6 lb

## 2012-02-19 DIAGNOSIS — J4489 Other specified chronic obstructive pulmonary disease: Secondary | ICD-10-CM

## 2012-02-19 DIAGNOSIS — J449 Chronic obstructive pulmonary disease, unspecified: Secondary | ICD-10-CM

## 2012-02-19 NOTE — Patient Instructions (Addendum)
Follow med calendar closely and bring to each visit  Keep working on quitting smoking .  Follow up as planned in 2 weeks and As needed

## 2012-02-19 NOTE — Assessment & Plan Note (Signed)
Compensated on present regimen. Cough improved with GERD prevention and PPI  Patient's medications were reviewed today and patient education was given. Computerized medication calendar was adjusted/completed    Plan  Follow med calendar closely and bring to each visit  Keep working on quitting smoking .  Follow up as planned in 2 weeks and As needed

## 2012-02-19 NOTE — Progress Notes (Signed)
Subjective:    Patient ID: Leslie Gamble, female    DOB: 03/17/1940  MRN: [id number]  HPI  82 yobf smoker with resp problems consisting of mostly nasal issues starting in childhood and followed by Elmon Kirschner referred 12/30/2011  by Dr Eula Listen for eval of cough.   12/30/2011 1st pulmonary eval / Sherene Sires for new onset cough and sob requiring inhalers x 2 months where didn't need them before.  Cough productive thick mucus x white worse when lying down but pretty much continuous all day to point where she looses her breath at rest.  Otherwise also sob only with exertion like getting in a hurry or up a flight of steps - no better with inhalers to date bu hfa technique very poor (see below). rec Stop symbicort and start dulera 100 Take 2 puffs first thing in am and then another 2 puffs about 12 hours later.  only take methylprednisolone if not improving on dulera The key is to stop smoking completely before smoking completely stops you- it's not too late Work on inhaler technique   01/29/2012 f/u ov/ Wert/ still smoking, some better p last ov then indolent onset progressively worse cough, SOB, and congesiton x 3 days. Cough is prod with large amounts of white sputum.  Dulera on zero and she's not sure how long. Using more saba than baseline x 3 days. Did not take medrol. rec nexium 40 mg Take 30-60 min before first meal of the day and pepcid 20 mg one at bedtime (I called this in to your pharmacy) Elwin Sleight 100 Take 2 puffs first thing in am and then another 2 puffs about 12 hours later.  Prednisone 10 mg take  4 each am x 2 days,   2 each am x 2 days,  1 each am x2days and stop  The key is to stop smoking completely   02/04/2012 f/u ov/Wert cc Pt c/o hoarseness and "deeper" coughing in order to remove phlegm from lungs. Pt would like FLU VACCINE if okay.  Does not actually bring up excess mucus. >>pepcid and tramadol rx   02/19/2012 followup and medication calendar. Patient returns for a two-week  followup and medication calendar. We reviewed all her medications and organized them into a medication calendar with patient education. Patient appears to be taking medications correctly. Patient had been given tramadol to help with cough control. Over last visit. Unfortunately, patient was unable to tolerate the tramadol. Due to facial swelling. Patient does report that she seems to be better with decreased cough and congestion. She denies any hemoptysis, orthopnea, PND, or leg swelling    We discussed the consists patient. She is currently been cutting down her smoking. We discussed alternative ideas for smoking cessation as well.   ROS: Constitutional:   No  weight loss, night sweats,  Fevers, chills,  +fatigue, or  lassitude.  HEENT:   No headaches,  Difficulty swallowing,  Tooth/dental problems, or  Sore throat,                No sneezing, itching, ear ache, nasal congestion, post nasal drip,   CV:  No chest pain,  Orthopnea, PND, swelling in lower extremities, anasarca, dizziness, palpitations, syncope.   GI  No heartburn, indigestion, abdominal pain, nausea, vomiting, diarrhea, change in bowel habits, loss of appetite, bloody stools.   Resp:    No coughing up of blood.     No chest wall deformity  Skin: no rash or lesions.  GU: no dysuria, change  in color of urine, no urgency or frequency.  No flank pain, no hematuria   MS:  No joint pain or swelling.  No decreased range of motion.    Psych:  No change in mood or affect. No depression or anxiety.  No memory loss.           Objective:   Physical Exam  Obese bf with harsh upper airway cough.  Wt 195 01/29/2012  > 194 02/04/2012 >196 02/19/2012       HEENT: nl dentition, turbinates, and orophanx. Nl external ear canals without cough reflex   NECK :  without JVD/Nodes/TM/ nl carotid upstrokes bilaterally   LUNGS: no acc muscle use, clear to A and P bilaterally without cough on insp or exp maneuvers   CV:  RRR  no  s3 or murmur or increase in P2, no edema   ABD:  soft and nontender with nl excursion in the supine position. No bruits or organomegaly, bowel sounds nl  MS:  warm without deformities, calf tenderness, cyanosis or clubbing  SKIN: warm and dry without lesions       CXR  01/29/2012 : No acute abnormality.               Assessment & Plan:

## 2012-02-24 NOTE — Addendum Note (Signed)
Addended by: Boone Master E on: 02/24/2012 01:45 PM   Modules accepted: Orders

## 2012-02-26 ENCOUNTER — Ambulatory Visit: Payer: Medicare Other | Admitting: Internal Medicine

## 2012-03-04 ENCOUNTER — Ambulatory Visit: Payer: Medicare Other | Admitting: Internal Medicine

## 2012-03-09 DIAGNOSIS — H04129 Dry eye syndrome of unspecified lacrimal gland: Secondary | ICD-10-CM | POA: Diagnosis not present

## 2012-03-09 DIAGNOSIS — H2510 Age-related nuclear cataract, unspecified eye: Secondary | ICD-10-CM | POA: Diagnosis not present

## 2012-03-18 ENCOUNTER — Encounter: Payer: Self-pay | Admitting: Internal Medicine

## 2012-03-18 ENCOUNTER — Ambulatory Visit (INDEPENDENT_AMBULATORY_CARE_PROVIDER_SITE_OTHER): Payer: Medicare Other | Admitting: Internal Medicine

## 2012-03-18 VITALS — BP 122/70 | HR 86 | Temp 97.6°F | Ht 66.0 in | Wt 194.4 lb

## 2012-03-18 DIAGNOSIS — J449 Chronic obstructive pulmonary disease, unspecified: Secondary | ICD-10-CM | POA: Diagnosis not present

## 2012-03-18 MED ORDER — MOMETASONE FURO-FORMOTEROL FUM 100-5 MCG/ACT IN AERO: INHALATION_SPRAY | RESPIRATORY_TRACT | Status: DC

## 2012-03-18 NOTE — Patient Instructions (Addendum)
As per med calendar >> Ok to use [facility] as Plan A for cough, wheeze short of breath up to 2 puffs every 12 hours  Only use your albuterol (Plan B) as a rescue medication to be used if you can't catch your breath by resting or doing a relaxed purse lip breathing pattern. The less you use it, the better it will work when you need it.    If you are satisfied with your treatment plan let your doctor know and he/she can either refill your medications or you can return here when your prescription runs out.     If in any way you are not 100% satisfied,  please tell us.  If 100% better, tell your friends!

## 2012-03-18 NOTE — Progress Notes (Signed)
Subjective:    Patient ID: Leslie Gamble, female    DOB: [birth year]  MRN: [id number]  HPI  21 yobf smoker with resp problems consisting of mostly nasal issues starting in childhood and followed by Elmon Kirschner referred 12/30/2011  by Dr Eula Listen for eval of cough.   12/30/2011 1st pulmonary eval / Sherene Sires for new onset cough and sob requiring inhalers x 2 months where didn't need them before.  Cough productive thick mucus x white worse when lying down but pretty much continuous all day to point where she looses her breath at rest.  Otherwise also sob only with exertion like getting in a hurry or up a flight of steps - no better with inhalers to date bu hfa technique very poor (see below). rec Stop symbicort and start dulera 100 Take 2 puffs first thing in am and then another 2 puffs about 12 hours later.  only take methylprednisolone if not improving on dulera The key is to stop smoking completely before smoking completely stops you- it's not too late Work on inhaler technique   01/29/2012 f/u ov/ Lonnel Gjerde/ still smoking, some better p last ov then indolent onset progressively worse cough, SOB, and congesiton x 3 days. Cough is prod with large amounts of white sputum.  Dulera on zero and she's not sure how long. Using more saba than baseline x 3 days. Did not take medrol. rec nexium 40 mg Take 30-60 min before first meal of the day and pepcid 20 mg one at bedtime (I called this in to your pharmacy) Elwin Sleight 100 Take 2 puffs first thing in am and then another 2 puffs about 12 hours later.  Prednisone 10 mg take  4 each am x 2 days,   2 each am x 2 days,  1 each am x2days and stop  The key is to stop smoking completely   02/04/2012 f/u ov/Arya Boxley cc Pt c/o hoarseness and "deeper" coughing in order to remove phlegm from lungs. Pt would like FLU VACCINE if okay.  Does not actually bring up excess mucus. >>pepcid and tramadol rx   02/19/2012 followup and medication calendar. Patient returns for a two-week  followup and medication calendar. We reviewed all her medications and organized them into a medication calendar with patient education. Patient appears to be taking medications correctly. Patient had been given tramadol to help with cough control. Over last visit. Unfortunately, patient was unable to tolerate the tramadol. Due to facial swelling. Patient does report that she seems to be better with decreased cough and congestion. She denies any hemoptysis, orthopnea, PND, or leg swelling    We discussed the consists patient. She is currently been cutting down her smoking. We discussed alternative ideas for smoking cessation as well. rec No change rx  03/18/2012 f/u ov/Yehia Mcbain cc all better only  On dulera rarely and never  using ventolin no limiting sob,  no cough  No obvious daytime variabilty or assoc  cp or chest tightness, subjective wheeze overt sinus or hb symptoms. No unusual exp hx or h/o childhood pna/ asthma or premature birth to her knowledge.   Sleeping ok without nocturnal  or early am exacerbation  of respiratory  c/o's or need for noct saba. Also denies any obvious fluctuation of symptoms with weather or environmental changes or other aggravating or alleviating factors except as outlined above  ROS  The following are not active complaints unless bolded sore throat, dysphagia, dental problems, itching, sneezing,  nasal congestion or excess/ purulent secretions, ear  ache,   fever, chills, sweats, unintended wt loss, pleuritic or exertional cp, hemoptysis,  orthopnea pnd or leg swelling, presyncope, palpitations, heartburn, abdominal pain, anorexia, nausea, vomiting, diarrhea  or change in bowel or urinary habits, change in stools or urine, dysuria,hematuria,  rash, arthralgias, visual complaints, headache, numbness weakness or ataxia or problems with walking or coordination,  change in mood/affect or memory.                   Objective:   Physical Exam  Obese bf nad no  longer with harsh cough  Wt 195 01/29/2012  > 194 02/04/2012 >196 02/19/2012  >  194 03/18/2012      HEENT: nl dentition, turbinates, and orophanx. Nl external ear canals without cough reflex   NECK :  without JVD/Nodes/TM/ nl carotid upstrokes bilaterally   LUNGS: no acc muscle use, clear to A and P bilaterally without cough on insp or exp maneuvers   CV:  RRR  no s3 or murmur or increase in P2, no edema   ABD:  soft and nontender with nl excursion in the supine position. No bruits or organomegaly, bowel sounds nl  MS:  warm without deformities, calf tenderness, cyanosis or clubbing  SKIN: warm and dry without lesions       CXR  01/29/2012 : No acute abnormality.               Assessment & Plan:

## 2012-03-19 NOTE — Assessment & Plan Note (Addendum)
-   hfa 75% p coaching 12/30/2011  - PFT's 12/30/2011 FEV1  1.55 (70%) ratio 63 with DLCO 63 corrects to 122  I had an extended discussion with the patient today lasting 15 to 20 minutes of a 25 minute visit on the following issues:   Adequate control on present rx(which is actually no pulmonary rx) , reviewed the concept that smoking cessation is the only aspect of care that changes the natural hx of the problems, that the meds only control the symptoms, which are not present at this point but will likely recur with smoking.   Each maintenance medication was reviewed in detail including most importantly the difference between maintenance and as needed and under what circumstances the prns are to be used. This was done in the context of a medication calendar review which provided the patient with a user-friendly unambiguous mechanism for medication administration and reconciliation and provides an action plan for all active problems. It is critical that this be shown to every doctor  for modification during the office visit if necessary so the patient can use it as a working document.      Pulmonary f/u can be prn

## 2012-03-25 ENCOUNTER — Emergency Department (HOSPITAL_COMMUNITY): Payer: Medicare Other

## 2012-03-25 ENCOUNTER — Encounter (HOSPITAL_COMMUNITY): Payer: Self-pay | Admitting: *Deleted

## 2012-03-25 ENCOUNTER — Emergency Department (HOSPITAL_COMMUNITY)
Admission: EM | Admit: 2012-03-25 | Discharge: 2012-03-25 | Disposition: A | Discharge status: Home / Self Care | Payer: Medicare Other | Attending: Emergency Medicine | Admitting: Emergency Medicine

## 2012-03-25 DIAGNOSIS — Z8719 Personal history of other diseases of the digestive system: Secondary | ICD-10-CM | POA: Diagnosis not present

## 2012-03-25 DIAGNOSIS — M19019 Primary osteoarthritis, unspecified shoulder: Secondary | ICD-10-CM | POA: Insufficient documentation

## 2012-03-25 DIAGNOSIS — Z8739 Personal history of other diseases of the musculoskeletal system and connective tissue: Secondary | ICD-10-CM | POA: Insufficient documentation

## 2012-03-25 DIAGNOSIS — E119 Type 2 diabetes mellitus without complications: Secondary | ICD-10-CM | POA: Insufficient documentation

## 2012-03-25 DIAGNOSIS — F17200 Nicotine dependence, unspecified, uncomplicated: Secondary | ICD-10-CM | POA: Diagnosis not present

## 2012-03-25 DIAGNOSIS — Z79899 Other long term (current) drug therapy: Secondary | ICD-10-CM | POA: Diagnosis not present

## 2012-03-25 DIAGNOSIS — R071 Chest pain on breathing: Secondary | ICD-10-CM | POA: Insufficient documentation

## 2012-03-25 DIAGNOSIS — F329 Major depressive disorder, single episode, unspecified: Secondary | ICD-10-CM | POA: Diagnosis not present

## 2012-03-25 DIAGNOSIS — M25511 Pain in right shoulder: Secondary | ICD-10-CM

## 2012-03-25 DIAGNOSIS — J45909 Unspecified asthma, uncomplicated: Secondary | ICD-10-CM | POA: Diagnosis not present

## 2012-03-25 DIAGNOSIS — M25519 Pain in unspecified shoulder: Secondary | ICD-10-CM | POA: Diagnosis not present

## 2012-03-25 DIAGNOSIS — I10 Essential (primary) hypertension: Secondary | ICD-10-CM | POA: Insufficient documentation

## 2012-03-25 DIAGNOSIS — M19011 Primary osteoarthritis, right shoulder: Secondary | ICD-10-CM

## 2012-03-25 DIAGNOSIS — F3289 Other specified depressive episodes: Secondary | ICD-10-CM | POA: Insufficient documentation

## 2012-03-25 LAB — CBC
MCH: 27.6 pg (ref 26.0–34.0)
MCHC: 33.8 g/dL (ref 30.0–36.0)
MCV: 81.7 fL (ref 78.0–100.0)
Platelets: 376 10*3/uL (ref 150–400)
RDW: 16.1 % — ABNORMAL HIGH (ref 11.5–15.5)

## 2012-03-25 LAB — POCT I-STAT TROPONIN I: Troponin i, poc: 0 ng/mL (ref 0.00–0.08)

## 2012-03-25 LAB — POCT I-STAT, CHEM 8
Calcium, Ion: 1.09 mmol/L — ABNORMAL LOW (ref 1.13–1.30)
Chloride: 102 mEq/L (ref 96–112)
HCT: 38 % (ref 36.0–46.0)
Sodium: 141 mEq/L (ref 135–145)

## 2012-03-25 MED ORDER — KETOROLAC TROMETHAMINE 60 MG/2ML IM SOLN
60.0000 mg | Freq: Once | INTRAMUSCULAR | Status: AC
  Administered 2012-03-25: 60 mg via INTRAMUSCULAR
  Filled 2012-03-25: qty 2

## 2012-03-25 MED ORDER — MELOXICAM 7.5 MG PO TABS: 15.0000 mg | ORAL_TABLET | Freq: Every day | ORAL | Status: DC

## 2012-03-25 MED ORDER — METHOCARBAMOL 500 MG PO TABS: 500.0000 mg | ORAL_TABLET | Freq: Two times a day (BID) | ORAL | Status: DC | PRN

## 2012-03-25 NOTE — ED Notes (Addendum)
Pt c/o R chest and shoulder and R back pain since yesterday that increases with movement.  Pt denies sob, nausea or pain on inspiration.  PT took oxycodone around 10 am with no relief.  Hx of R rotator cuff repair.

## 2012-03-25 NOTE — ED Provider Notes (Signed)
History     CSN: [id number]  Arrival date & time 03/25/12  1145   First MD Initiated Contact with Patient 03/25/12 1210      Chief Complaint  Patient presents with  . Chest Pain    R   . Shoulder Pain    (Consider location/radiation/quality/duration/timing/severity/associated sxs/prior treatment) HPI Comments: 72 year old female with a history of shoulder pain in the past presents with recurrent right shoulder pain. She states this started yesterday evening, it is located in the anterior lateral and posterior right shoulder. It is persistent but much worse when she moves her arm and shoulder. She denies chest pain, shortness of breath, lower extremity swelling, fevers chills or coughing. At this time the symptoms are mild to moderate. She does have a history of a rotator cuff repair. She does not have a history of coronary disease but does have high blood pressure and diabetes.  Patient is a 72 y.o. female presenting with chest pain and shoulder pain. The history is provided by the patient.  Chest Pain Shoulder Pain Associated symptoms include chest pain.    Past Medical History  Diagnosis Date  . Hypertension   . Diabetes mellitus   . Depression   . Asthma   . Diverticulitis   . Arthritis     Past Surgical History  Procedure Laterality Date  . Rotator cuff repair    . Knee surgery    . Back surgery    . Diverticulitis    . Breast surgery      Biopsy-benign    Family History  Problem Relation Age of Onset  . Hypertension Father   . Heart disease Brother   . Hypertension Maternal Grandfather   . Heart disease Maternal Grandfather   . Diabetes Maternal Grandfather   . Diabetes Paternal Grandmother     History  Substance Use Topics  . Smoking status: Current Every Day Smoker -- 0.30 packs/day for 50 years    Types: Cigarettes  . Smokeless tobacco: Not on file     Comment: LAST 5 DAYS--5 CIGS  . Alcohol Use: No    OB History   Grav Para Term Preterm  Abortions TAB SAB Ect Mult Living   5 3 3  2     3       Review of Systems  Cardiovascular: Positive for chest pain.  All other systems reviewed and are negative.    Allergies  Avelox; Ciprofloxacin; Clindamycin hcl; Codeine; Diflucan; Metronidazole; Nitrofurantoin monohyd macro; Penicillins; Sulfa antibiotics; Tetracyclines & related; and Tramadol hcl  Home Medications   Current Outpatient Rx  Name  Route  Sig  Dispense  Refill  . albuterol (PROAIR HFA) 108 (90 BASE) MCG/ACT inhaler   Inhalation   Inhale 2 puffs into the lungs every 4 (four) hours as needed for shortness of breath.          . ALPRAZolam (XANAX) 0.5 MG tablet   Oral   Take 0.5 mg by mouth at bedtime as needed for sleep or anxiety. 2 tabs by mouth at bedtime         . amLODipine (NORVASC) 10 MG tablet   Oral   Take 10 mg by mouth every morning.          . Cholecalciferol (VITAMIN D) 2000 UNITS CAPS   Oral   Take 1 capsule by mouth every evening.         . diclofenac sodium (VOLTAREN) 1 % GEL   Topical   Apply 4 g  topically 4 (four) times daily as needed (for muscle soreness).         . DULoxetine (CYMBALTA) 60 MG capsule   Oral   Take 60 mg by mouth 2 (two) times daily.          Marland Kitchen esomeprazole (NEXIUM) 40 MG capsule   Oral   Take 40 mg by mouth daily before breakfast.         . famotidine (PEPCID) 20 MG tablet   Oral   Take 20 mg by mouth at bedtime. One at bedtime         . Liraglutide (VICTOZA) 18 MG/3ML SOLN injection   Subcutaneous   Inject 1.8 mg into the skin every evening.         Marland Kitchen losartan-hydrochlorothiazide (HYZAAR) 100-25 MG per tablet   Oral   Take 1 tablet by mouth daily.         . metFORMIN (GLUCOPHAGE) 1000 MG tablet   Oral   Take 1,000 mg by mouth 2 (two) times daily with a meal.         . mometasone (NASONEX) 50 MCG/ACT nasal spray   Nasal   Place 2 sprays into the nose daily.         . mometasone-formoterol (DULERA) 100-5 MCG/ACT AERO   Oral    Take 2 puffs by mouth 2 (two) times daily.         Marland Kitchen oxyCODONE (OXY IR/ROXICODONE) 5 MG immediate release tablet   Oral   Take 5 mg by mouth every 4 (four) hours as needed for pain.         . pioglitazone (ACTOS) 45 MG tablet   Oral   Take 22.5 mg by mouth daily. 1/2 tab by mouth once daily         . potassium chloride (KLOR-CON) 8 MEQ tablet   Oral   Take 8 mEq by mouth 2 (two) times daily.         Marland Kitchen zolpidem (AMBIEN) 5 MG tablet   Oral   Take 5 mg by mouth at bedtime as needed for sleep.          . meloxicam (MOBIC) 7.5 MG tablet   Oral   Take 2 tablets (15 mg total) by mouth daily.   30 tablet   0   . methocarbamol (ROBAXIN) 500 MG tablet   Oral   Take 1 tablet (500 mg total) by mouth 2 (two) times daily as needed.   20 tablet   0     BP 129/69  Pulse 88  Temp(Src) 98.4 F (36.9 C) (Oral)  Resp 16  Ht 5\' 6"  (1.676 m)  Wt 193 lb (87.544 kg)  BMI 31.17 kg/m2  SpO2 93%  Physical Exam  Nursing note and vitals reviewed. Constitutional: She appears well-developed and well-nourished. No distress.  HENT:  Head: Normocephalic and atraumatic.  Mouth/Throat: Oropharynx is clear and moist. No oropharyngeal exudate.  Eyes: Conjunctivae and EOM are normal. Pupils are equal, round, and reactive to light. Right eye exhibits no discharge. Left eye exhibits no discharge. No scleral icterus.  Neck: Normal range of motion. Neck supple. No JVD present. No thyromegaly present.  Cardiovascular: Normal rate, regular rhythm, normal heart sounds and intact distal pulses.  Exam reveals no gallop and no friction rub.   No murmur heard. Pulmonary/Chest: Effort normal and breath sounds normal. No respiratory distress. She has no wheezes. She has no rales.  Abdominal: Soft. Bowel sounds are normal. She exhibits no  distension and no mass. There is no tenderness.  Musculoskeletal: Normal range of motion. She exhibits tenderness ( Significant tenderness to palpation around the right  shoulder, no rashes, no swelling, normal range of motion to passive range of motion. Worse with resistance against internal and external rotation as well as abduction. Normal grip,). She exhibits no edema.  No deformity swelling or pain in the other 3 extremities  Lymphadenopathy:    She has no cervical adenopathy.  Neurological: She is alert. Coordination normal.  Normal sensation and motor to the right upper external  Skin: Skin is warm and dry. No rash noted. No erythema.  Psychiatric: She has a normal mood and affect. Her behavior is normal.    ED Course  Procedures (including critical care time)  Labs Reviewed  POCT I-STAT, CHEM 8 - Abnormal; Notable for the following:    Potassium 3.4 (*)    Glucose, Bld 127 (*)    Calcium, Ion 1.09 (*)    All other components within normal limits  CBC  POCT I-STAT TROPONIN I   Dg Shoulder Right  03/25/2012  *RADIOLOGY REPORT*  Clinical Data: Posterior right shoulder pain, no known injury  RIGHT SHOULDER - 2+ VIEW  Comparison: None  Findings: Osseous demineralization. AC joint alignment normal. Minimal spurring at right humeral head. No acute fracture, dislocation, or bone destruction. Visualized right ribs intact.  IMPRESSION: Osseous demineralization. Mild right glenohumeral degenerative changes.   Original Report Authenticated By: Ulyses Southward, M.D.      1. Pain in right shoulder   2. Glenohumeral arthritis, right       MDM  The patient has clear heart sounds, clear lung sounds, an EKG which is nonischemic and right shoulder focal symptoms. I suspect that she has right shoulder girdle pain and tenderness likely related to a musculoskeletal cause. She will undergo x-ray, I have ordered Toradol, she did take oxycodone prior to arrival without any improvement. She does use oxycodone chronically for pain in her bilateral knees.  ED ECG REPORT  I personally interpreted this EKG   Date: 03/25/2012   Rate: 83  Rhythm: normal sinus rhythm  QRS  Axis: normal  Intervals: normal  ST/T Wave abnormalities: normal  Conduction Disutrbances:none  Narrative Interpretation:   Old EKG Reviewed: none available   I personally interpreted the x-rays and find her to be a small amount of arthritic change to the right glenohumeral joint. I agree with the radiologist interpretation of the x-ray, I believe that the patient's pain is musculoskeletal, will prescribe meloxicam and Robaxin for home, followup with her doctor on Monday if no improvement.  Laboratory data reveals borderline hypokalemia, normal troponin, normal hemoglobin and hematocrit    Vida Roller, MD 03/25/12 1331

## 2012-03-29 DIAGNOSIS — M25519 Pain in unspecified shoulder: Secondary | ICD-10-CM | POA: Diagnosis not present

## 2012-04-14 DIAGNOSIS — F331 Major depressive disorder, recurrent, moderate: Secondary | ICD-10-CM | POA: Diagnosis not present

## 2012-04-21 ENCOUNTER — Encounter (HOSPITAL_COMMUNITY): Payer: Self-pay

## 2012-04-21 ENCOUNTER — Emergency Department (HOSPITAL_COMMUNITY)
Admission: EM | Admit: 2012-04-21 | Discharge: 2012-04-21 | Disposition: A | Discharge status: Home / Self Care | Payer: Medicare Other | Attending: Emergency Medicine | Admitting: Emergency Medicine

## 2012-04-21 DIAGNOSIS — M549 Dorsalgia, unspecified: Secondary | ICD-10-CM | POA: Diagnosis not present

## 2012-04-21 DIAGNOSIS — E119 Type 2 diabetes mellitus without complications: Secondary | ICD-10-CM | POA: Insufficient documentation

## 2012-04-21 DIAGNOSIS — F329 Major depressive disorder, single episode, unspecified: Secondary | ICD-10-CM | POA: Diagnosis not present

## 2012-04-21 DIAGNOSIS — F17200 Nicotine dependence, unspecified, uncomplicated: Secondary | ICD-10-CM | POA: Diagnosis not present

## 2012-04-21 DIAGNOSIS — K029 Dental caries, unspecified: Secondary | ICD-10-CM | POA: Diagnosis not present

## 2012-04-21 DIAGNOSIS — J45909 Unspecified asthma, uncomplicated: Secondary | ICD-10-CM | POA: Insufficient documentation

## 2012-04-21 DIAGNOSIS — R599 Enlarged lymph nodes, unspecified: Secondary | ICD-10-CM | POA: Diagnosis not present

## 2012-04-21 DIAGNOSIS — R209 Unspecified disturbances of skin sensation: Secondary | ICD-10-CM | POA: Insufficient documentation

## 2012-04-21 DIAGNOSIS — Z79899 Other long term (current) drug therapy: Secondary | ICD-10-CM | POA: Insufficient documentation

## 2012-04-21 DIAGNOSIS — J3489 Other specified disorders of nose and nasal sinuses: Secondary | ICD-10-CM | POA: Insufficient documentation

## 2012-04-21 DIAGNOSIS — K1120 Sialoadenitis, unspecified: Secondary | ICD-10-CM | POA: Diagnosis not present

## 2012-04-21 DIAGNOSIS — M129 Arthropathy, unspecified: Secondary | ICD-10-CM | POA: Insufficient documentation

## 2012-04-21 DIAGNOSIS — I10 Essential (primary) hypertension: Secondary | ICD-10-CM | POA: Diagnosis not present

## 2012-04-21 DIAGNOSIS — Z8719 Personal history of other diseases of the digestive system: Secondary | ICD-10-CM | POA: Insufficient documentation

## 2012-04-21 DIAGNOSIS — F3289 Other specified depressive episodes: Secondary | ICD-10-CM | POA: Insufficient documentation

## 2012-04-21 DIAGNOSIS — R6884 Jaw pain: Secondary | ICD-10-CM | POA: Diagnosis not present

## 2012-04-21 MED ORDER — OXYCODONE-ACETAMINOPHEN 5-325 MG PO TABS: 1.0000 | ORAL_TABLET | Freq: Four times a day (QID) | ORAL | Status: DC | PRN

## 2012-04-21 MED ORDER — OXYCODONE HCL ER 10 MG PO T12A: 10.0000 mg | EXTENDED_RELEASE_TABLET | Freq: Two times a day (BID) | ORAL | Status: DC

## 2012-04-21 NOTE — ED Notes (Signed)
No secondary assessment completed upon night shift RN arrival. 

## 2012-04-21 NOTE — ED Notes (Signed)
Pt waiting to be seen by MD

## 2012-04-21 NOTE — ED Notes (Signed)
Delay in care explained to pt

## 2012-04-21 NOTE — ED Provider Notes (Signed)
History    This chart was scribed for Gilmer Mor, working with Loren Racer, MD by Melba Coon, ED Scribe. This patient was seen in room TR05C/TR05C and the patient's care was started at 5:58PM.   CSN: [id number]  Arrival date & time 04/21/12  1717   None     Chief Complaint  Patient presents with  . Allergic Reaction    (Consider location/radiation/quality/duration/timing/severity/associated sxs/prior treatment) The history is provided by the patient. No language interpreter was used.   Leslie Gamble is a 72 y.o. female who presents to the Emergency Department complaining of persistent, moderate perioral/lip and nasal swelling with an onset today around 6 hours ago pertaining to an allergic reaction. She reports she had a mouth sore inside her mouth on the right side; she went to her dentist office and was prescribed "magic mouthwash". After she drank it, she reports she went to sleep and when she woke up, she presented with the current symptoms. She reports she has throat discomfort like it might be swollen. However, she reports she had some jaw pain before the allergic symptoms started; however she just came from the dentist earlier today and had a mouth sore before today. She denies pruritis and dysphagia. She has not tried benadryl today but she reports she has tried it in the past without alleviation of allergic symptoms. Denies difficulty breathing, SOB, wheezing, and rash. Denies HA, fever, neck pain, sore throat, CP, abdominal pain, nausea, emesis, diarrhea, dysuria, or extremity pain, edema, weakness, numbness, or tingling. She reports she can take oxycodone for the pain. She reports back pain and some sinus drainage. No other pertinent medical symptoms.  Past Medical History  Diagnosis Date  . Hypertension   . Diabetes mellitus   . Depression   . Asthma   . Diverticulitis   . Arthritis     Past Surgical History  Procedure Laterality Date  . Rotator  cuff repair    . Knee surgery    . Back surgery    . Diverticulitis    . Breast surgery      Biopsy-benign    Family History  Problem Relation Age of Onset  . Hypertension Father   . Heart disease Brother   . Hypertension Maternal Grandfather   . Heart disease Maternal Grandfather   . Diabetes Maternal Grandfather   . Diabetes Paternal Grandmother     History  Substance Use Topics  . Smoking status: Current Every Day Smoker -- 0.30 packs/day for 50 years    Types: Cigarettes  . Smokeless tobacco: Not on file     Comment: LAST 5 DAYS--5 CIGS  . Alcohol Use: No    OB History   Grav Para Term Preterm Abortions TAB SAB Ect Mult Living   5 3 3  2     3       Review of Systems 10 Systems reviewed and all are negative for acute change except as noted in the HPI.   Allergies  Avelox; Ciprofloxacin; Clindamycin hcl; Codeine; Diflucan; Metronidazole; Nitrofurantoin monohyd macro; Penicillins; Sulfa antibiotics; Tetracyclines & related; and Tramadol hcl  Home Medications   Current Outpatient Rx  Name  Route  Sig  Dispense  Refill  . albuterol (PROAIR HFA) 108 (90 BASE) MCG/ACT inhaler   Inhalation   Inhale 2 puffs into the lungs every 4 (four) hours as needed for shortness of breath.          . ALPRAZolam (XANAX) 0.5 MG tablet  Oral   Take 0.5 mg by mouth at bedtime as needed for sleep or anxiety. 2 tabs by mouth at bedtime         . amLODipine (NORVASC) 10 MG tablet   Oral   Take 10 mg by mouth every morning.          . Cholecalciferol (VITAMIN D) 2000 UNITS CAPS   Oral   Take 1 capsule by mouth every evening.         . diclofenac sodium (VOLTAREN) 1 % GEL   Topical   Apply 4 g topically 4 (four) times daily as needed (for muscle soreness).         . DULoxetine (CYMBALTA) 60 MG capsule   Oral   Take 60 mg by mouth 2 (two) times daily.          Marland Kitchen esomeprazole (NEXIUM) 40 MG capsule   Oral   Take 40 mg by mouth daily before breakfast.          . famotidine (PEPCID) 20 MG tablet   Oral   Take 20 mg by mouth at bedtime. One at bedtime         . Liraglutide (VICTOZA) 18 MG/3ML SOLN injection   Subcutaneous   Inject 1.8 mg into the skin every evening.         Marland Kitchen losartan-hydrochlorothiazide (HYZAAR) 100-25 MG per tablet   Oral   Take 1 tablet by mouth daily.         . meloxicam (MOBIC) 7.5 MG tablet   Oral   Take 2 tablets (15 mg total) by mouth daily.   30 tablet   0   . metFORMIN (GLUCOPHAGE) 1000 MG tablet   Oral   Take 1,000 mg by mouth 2 (two) times daily with a meal.         . methocarbamol (ROBAXIN) 500 MG tablet   Oral   Take 1 tablet (500 mg total) by mouth 2 (two) times daily as needed.   20 tablet   0   . mometasone (NASONEX) 50 MCG/ACT nasal spray   Nasal   Place 2 sprays into the nose daily.         . mometasone-formoterol (DULERA) 100-5 MCG/ACT AERO   Oral   Take 2 puffs by mouth 2 (two) times daily.         Marland Kitchen oxyCODONE (OXY IR/ROXICODONE) 5 MG immediate release tablet   Oral   Take 5 mg by mouth every 4 (four) hours as needed for pain.         . pioglitazone (ACTOS) 45 MG tablet   Oral   Take 22.5 mg by mouth daily. 1/2 tab by mouth once daily         . potassium chloride (KLOR-CON) 8 MEQ tablet   Oral   Take 8 mEq by mouth 2 (two) times daily.         Marland Kitchen zolpidem (AMBIEN) 5 MG tablet   Oral   Take 5 mg by mouth at bedtime as needed for sleep.            BP 146/64  Pulse 96  Temp(Src) 97.5 F (36.4 C) (Oral)  Resp 14  SpO2 96%  Physical Exam  Nursing note and vitals reviewed. Constitutional: She is oriented to person, place, and time. She appears well-developed and well-nourished. No distress.  HENT:  Head: Normocephalic and atraumatic.  Abnormal dentition with dental caries without gingival swelling or bleeding. Bilateral parotid gland enlargement w/o  erythema or exudate.   Eyes: EOM are normal.  Neck: Neck supple. No tracheal deviation present.   Cardiovascular: Normal rate.   Pulmonary/Chest: Effort normal. No respiratory distress.  Musculoskeletal: Normal range of motion. She exhibits no tenderness.  Lymphadenopathy:    She has cervical adenopathy.  Neurological: She is alert and oriented to person, place, and time. No cranial nerve deficit.  Skin: Skin is warm and dry. No rash noted. No erythema. No pallor.  Psychiatric: She has a normal mood and affect. Her behavior is normal.    ED Course  Procedures (including critical care time)  DIAGNOSTIC STUDIES: Oxygen Saturation is 96% on room air, normal by my interpretation.    COORDINATION OF CARE:  6:08PM - Oxycodone will be prescribed. She is advised to flu with her PCP if her symptoms progressively get worse. She is advised to stop using the magic mouthwash. She will follow up with her dentist in 2 weeks. 8:22PM - she will be seen by attending physician for further evaluation 9:19PM - attending physician evaluates pt and discusses possible complications to look for moving forward. She is ready for d/c.   Labs Reviewed - No data to display No results found.   1. Sialadenitis       MDM  Patient is a 72 yo F presenting with lip numbness and tingling after MagicMouth wash use. Patient was observed for any anaphylactic type reaction, but no perioral or throat swelling observed, lungs CTA bilaterally, patient able to sit, talk, ambulate without respiratory difficulty. Airway remained patent during ED course. Lip numbness and tingling resolved, most likely from getting the lidocaine on lips from MagicMouth wash. Patient did have enlarged bilateral parotid glands. Advised to use hard candies to help reduce the swelling. Advised to follow up with PCP in 1-2 days. Patient agreeable to plan. Patient d/w with Dr. Ranae Palms, agrees with plan. Patient is stable at time of discharge    I personally performed the services described in this documentation, which was scribed in my  presence. The recorded information has been reviewed and is accurate.         Jeannetta Ellis, PA-C 04/23/12 0820

## 2012-04-21 NOTE — ED Notes (Signed)
Today pt had a mouth sore and went to dentist office and was prescribed "magic mouthwash" and after she drank it she noticed circumoral and nasal swelling and allergic reaction. They recommended her to come here for further evaluation. No difficulty breathing and no rash.

## 2012-04-25 NOTE — ED Provider Notes (Signed)
Medical screening examination/treatment/procedure(s) were conducted as a shared visit with non-physician practitioner(s) and myself.  I personally evaluated the patient during the encounter   Leslie Racer, MD 04/25/12 [phone]

## 2012-04-28 DIAGNOSIS — K1120 Sialoadenitis, unspecified: Secondary | ICD-10-CM | POA: Diagnosis not present

## 2012-04-29 DIAGNOSIS — E782 Mixed hyperlipidemia: Secondary | ICD-10-CM | POA: Diagnosis not present

## 2012-05-05 DIAGNOSIS — Z0000 Encounter for general adult medical examination without abnormal findings: Secondary | ICD-10-CM | POA: Diagnosis not present

## 2012-05-05 DIAGNOSIS — K219 Gastro-esophageal reflux disease without esophagitis: Secondary | ICD-10-CM | POA: Diagnosis not present

## 2012-05-05 DIAGNOSIS — I10 Essential (primary) hypertension: Secondary | ICD-10-CM | POA: Diagnosis not present

## 2012-05-05 DIAGNOSIS — F17200 Nicotine dependence, unspecified, uncomplicated: Secondary | ICD-10-CM | POA: Diagnosis not present

## 2012-05-05 DIAGNOSIS — J449 Chronic obstructive pulmonary disease, unspecified: Secondary | ICD-10-CM | POA: Diagnosis not present

## 2012-05-05 DIAGNOSIS — Z1331 Encounter for screening for depression: Secondary | ICD-10-CM | POA: Diagnosis not present

## 2012-05-05 DIAGNOSIS — E119 Type 2 diabetes mellitus without complications: Secondary | ICD-10-CM | POA: Diagnosis not present

## 2012-05-05 DIAGNOSIS — F329 Major depressive disorder, single episode, unspecified: Secondary | ICD-10-CM | POA: Diagnosis not present

## 2012-05-25 DIAGNOSIS — F331 Major depressive disorder, recurrent, moderate: Secondary | ICD-10-CM | POA: Diagnosis not present

## 2012-06-06 DIAGNOSIS — M1710 Unilateral primary osteoarthritis, unspecified knee: Secondary | ICD-10-CM | POA: Diagnosis not present

## 2012-06-27 DIAGNOSIS — F331 Major depressive disorder, recurrent, moderate: Secondary | ICD-10-CM | POA: Diagnosis not present

## 2012-07-19 DIAGNOSIS — M779 Enthesopathy, unspecified: Secondary | ICD-10-CM | POA: Diagnosis not present

## 2012-07-19 DIAGNOSIS — M722 Plantar fascial fibromatosis: Secondary | ICD-10-CM | POA: Diagnosis not present

## 2012-07-19 DIAGNOSIS — M2010 Hallux valgus (acquired), unspecified foot: Secondary | ICD-10-CM | POA: Diagnosis not present

## 2012-07-19 DIAGNOSIS — M2040 Other hammer toe(s) (acquired), unspecified foot: Secondary | ICD-10-CM | POA: Diagnosis not present

## 2012-08-03 ENCOUNTER — Other Ambulatory Visit: Payer: Self-pay | Admitting: Gastroenterology

## 2012-08-04 ENCOUNTER — Emergency Department (HOSPITAL_COMMUNITY): Payer: No Typology Code available for payment source

## 2012-08-04 ENCOUNTER — Emergency Department (HOSPITAL_COMMUNITY)
Admission: EM | Admit: 2012-08-04 | Discharge: 2012-08-04 | Disposition: A | Discharge status: Home / Self Care | Payer: No Typology Code available for payment source | Attending: Emergency Medicine | Admitting: Emergency Medicine

## 2012-08-04 DIAGNOSIS — Y92410 Unspecified street and highway as the place of occurrence of the external cause: Secondary | ICD-10-CM | POA: Insufficient documentation

## 2012-08-04 DIAGNOSIS — S139XXA Sprain of joints and ligaments of unspecified parts of neck, initial encounter: Secondary | ICD-10-CM | POA: Insufficient documentation

## 2012-08-04 DIAGNOSIS — S8002XA Contusion of left knee, initial encounter: Secondary | ICD-10-CM

## 2012-08-04 DIAGNOSIS — S46909A Unspecified injury of unspecified muscle, fascia and tendon at shoulder and upper arm level, unspecified arm, initial encounter: Secondary | ICD-10-CM | POA: Insufficient documentation

## 2012-08-04 DIAGNOSIS — E119 Type 2 diabetes mellitus without complications: Secondary | ICD-10-CM | POA: Insufficient documentation

## 2012-08-04 DIAGNOSIS — Z8719 Personal history of other diseases of the digestive system: Secondary | ICD-10-CM | POA: Insufficient documentation

## 2012-08-04 DIAGNOSIS — S298XXA Other specified injuries of thorax, initial encounter: Secondary | ICD-10-CM | POA: Diagnosis not present

## 2012-08-04 DIAGNOSIS — S99919A Unspecified injury of unspecified ankle, initial encounter: Secondary | ICD-10-CM | POA: Diagnosis not present

## 2012-08-04 DIAGNOSIS — M25569 Pain in unspecified knee: Secondary | ICD-10-CM | POA: Diagnosis not present

## 2012-08-04 DIAGNOSIS — S4980XA Other specified injuries of shoulder and upper arm, unspecified arm, initial encounter: Secondary | ICD-10-CM | POA: Diagnosis not present

## 2012-08-04 DIAGNOSIS — S0990XA Unspecified injury of head, initial encounter: Secondary | ICD-10-CM | POA: Insufficient documentation

## 2012-08-04 DIAGNOSIS — S8990XA Unspecified injury of unspecified lower leg, initial encounter: Secondary | ICD-10-CM | POA: Diagnosis not present

## 2012-08-04 DIAGNOSIS — IMO0002 Reserved for concepts with insufficient information to code with codable children: Secondary | ICD-10-CM | POA: Insufficient documentation

## 2012-08-04 DIAGNOSIS — S161XXA Strain of muscle, fascia and tendon at neck level, initial encounter: Secondary | ICD-10-CM

## 2012-08-04 DIAGNOSIS — R079 Chest pain, unspecified: Secondary | ICD-10-CM | POA: Diagnosis not present

## 2012-08-04 DIAGNOSIS — I10 Essential (primary) hypertension: Secondary | ICD-10-CM | POA: Insufficient documentation

## 2012-08-04 DIAGNOSIS — F329 Major depressive disorder, single episode, unspecified: Secondary | ICD-10-CM | POA: Insufficient documentation

## 2012-08-04 DIAGNOSIS — Z880 Allergy status to penicillin: Secondary | ICD-10-CM | POA: Insufficient documentation

## 2012-08-04 DIAGNOSIS — Z79899 Other long term (current) drug therapy: Secondary | ICD-10-CM | POA: Insufficient documentation

## 2012-08-04 DIAGNOSIS — Z8739 Personal history of other diseases of the musculoskeletal system and connective tissue: Secondary | ICD-10-CM | POA: Insufficient documentation

## 2012-08-04 DIAGNOSIS — Y9389 Activity, other specified: Secondary | ICD-10-CM | POA: Insufficient documentation

## 2012-08-04 DIAGNOSIS — R51 Headache: Secondary | ICD-10-CM | POA: Diagnosis not present

## 2012-08-04 DIAGNOSIS — S199XXA Unspecified injury of neck, initial encounter: Secondary | ICD-10-CM | POA: Diagnosis not present

## 2012-08-04 DIAGNOSIS — F17200 Nicotine dependence, unspecified, uncomplicated: Secondary | ICD-10-CM | POA: Insufficient documentation

## 2012-08-04 DIAGNOSIS — J45909 Unspecified asthma, uncomplicated: Secondary | ICD-10-CM | POA: Insufficient documentation

## 2012-08-04 DIAGNOSIS — S8000XA Contusion of unspecified knee, initial encounter: Secondary | ICD-10-CM | POA: Insufficient documentation

## 2012-08-04 DIAGNOSIS — T148XXA Other injury of unspecified body region, initial encounter: Secondary | ICD-10-CM | POA: Diagnosis not present

## 2012-08-04 DIAGNOSIS — Z9889 Other specified postprocedural states: Secondary | ICD-10-CM | POA: Insufficient documentation

## 2012-08-04 DIAGNOSIS — F3289 Other specified depressive episodes: Secondary | ICD-10-CM | POA: Insufficient documentation

## 2012-08-04 DIAGNOSIS — M25519 Pain in unspecified shoulder: Secondary | ICD-10-CM | POA: Diagnosis not present

## 2012-08-04 MED ORDER — OXYCODONE-ACETAMINOPHEN 5-325 MG PO TABS
1.0000 | ORAL_TABLET | ORAL | Status: DC | PRN
Start: 2012-08-04 — End: 2012-09-07

## 2012-08-04 MED ORDER — MORPHINE SULFATE 2 MG/ML IJ SOLN
2.0000 mg | Freq: Once | INTRAMUSCULAR | Status: AC
  Administered 2012-08-04: 2 mg via INTRAMUSCULAR
  Filled 2012-08-04: qty 1

## 2012-08-04 NOTE — Discharge Instructions (Signed)
Cervical Sprain A cervical sprain is an injury in the neck in which the ligaments are stretched or torn. The ligaments are the tissues that hold the bones of the neck (vertebrae) in place.Cervical sprains can range from very mild to very severe. Most cervical sprains get better in 1 to 3 weeks, but it depends on the cause and extent of the injury. Severe cervical sprains can cause the neck vertebrae to be unstable. This can lead to damage of the spinal cord and can result in serious nervous system problems. Your caregiver will determine whether your cervical sprain is mild or severe. CAUSES  Severe cervical sprains may be caused by:  Contact sport injuries (football, rugby, wrestling, hockey, auto racing, gymnastics, diving, martial arts, boxing).  Motor vehicle collisions.  Whiplash injuries. This means the neck is forcefully whipped backward and forward.  Falls. Mild cervical sprains may be caused by:   Awkward positions, such as cradling a telephone between your ear and shoulder.  Sitting in a chair that does not offer proper support.  Working at a poorly Marketing executive station.  Activities that require looking up or down for long periods of time. SYMPTOMS   Pain, soreness, stiffness, or a burning sensation in the front, back, or sides of the neck. This discomfort may develop immediately after injury or it may develop slowly and not begin for 24 hours or more after an injury.  Pain or tenderness directly in the middle of the back of the neck.  Shoulder or upper back pain.  Limited ability to move the neck.  Headache.  Dizziness.  Weakness, numbness, or tingling in the hands or arms.  Muscle spasms.  Difficulty swallowing or chewing.  Tenderness and swelling of the neck. DIAGNOSIS  Most of the time, your caregiver can diagnose this problem by taking your history and doing a physical exam. Your caregiver will ask about any known problems, such as arthritis in the neck  or a previous neck injury. X-rays may be taken to find out if there are any other problems, such as problems with the bones of the neck. However, an X-ray often does not reveal the full extent of a cervical sprain. Other tests such as a computed tomography (CT) scan or magnetic resonance imaging (MRI) may be needed. TREATMENT  Treatment depends on the severity of the cervical sprain. Mild sprains can be treated with rest, keeping the neck in place (immobilization), and pain medicines. Severe cervical sprains need immediate immobilization and an appointment with an orthopedist or neurosurgeon. Several treatment options are available to help with pain, muscle spasms, and other symptoms. Your caregiver may prescribe:  Medicines, such as pain relievers, numbing medicines, or muscle relaxants.  Physical therapy. This can include stretching exercises, strengthening exercises, and posture training. Exercises and improved posture can help stabilize the neck, strengthen muscles, and help stop symptoms from returning.  A neck collar to be worn for short periods of time. Often, these collars are worn for comfort. However, certain collars may be worn to protect the neck and prevent further worsening of a serious cervical sprain. HOME CARE INSTRUCTIONS   Put ice on the injured area.  Put ice in a plastic bag.  Place a towel between your skin and the bag.  Leave the ice on for 15-20 minutes, 3-4 times a day.  Only take over-the-counter or prescription medicines for pain, discomfort, or fever as directed by your caregiver.  Keep all follow-up appointments as directed by your caregiver.  Keep all  physical therapy appointments as directed by your caregiver.  If a neck collar is prescribed, wear it as directed by your caregiver.  Do not drive while wearing a neck collar.  Make any needed adjustments to your work station to promote good posture.  Avoid positions and activities that make your symptoms  worse.  Warm up and stretch before being active to help prevent problems. SEEK MEDICAL CARE IF:   Your pain is not controlled with medicine.  You are unable to decrease your pain medicine over time as planned.  Your activity level is not improving as expected. SEEK IMMEDIATE MEDICAL CARE IF:   You develop any bleeding, stomach upset, or signs of an allergic reaction to your medicine.  Your symptoms get worse.  You develop new, unexplained symptoms.  You have numbness, tingling, weakness, or paralysis in any part of your body. MAKE SURE YOU:   Understand these instructions.  Will watch your condition.  Will get help right away if you are not doing well or get worse. Document Released: 11/02/2006 Document Revised: 03/30/2011 Document Reviewed: 10/08/2010 [facility] Patient Information 2014 [place], [place].  Contusion A contusion is a deep bruise. Contusions are the result of an injury that caused bleeding under the skin. The contusion may turn blue, purple, or yellow. Minor injuries will give you a painless contusion, but more severe contusions may stay painful and swollen for a few weeks.  CAUSES  A contusion is usually caused by a blow, trauma, or direct force to an area of the body. SYMPTOMS   Swelling and redness of the injured area.  Bruising of the injured area.  Tenderness and soreness of the injured area.  Pain. DIAGNOSIS  The diagnosis can be made by taking a history and physical exam. An X-ray, CT scan, or MRI may be needed to determine if there were any associated injuries, such as fractures. TREATMENT  Specific treatment will depend on what area of the body was injured. In general, the best treatment for a contusion is resting, icing, elevating, and applying cold compresses to the injured area. Over-the-counter medicines may also be recommended for pain control. Ask your caregiver what the best treatment is for your contusion. HOME CARE INSTRUCTIONS   Put ice  on the injured area.  Put ice in a plastic bag.  Place a towel between your skin and the bag.  Leave the ice on for 15-20 minutes, 3-4 times a day.  Only take over-the-counter or prescription medicines for pain, discomfort, or fever as directed by your caregiver. Your caregiver may recommend avoiding anti-inflammatory medicines (aspirin, ibuprofen, and naproxen) for 48 hours because these medicines may increase bruising.  Rest the injured area.  If possible, elevate the injured area to reduce swelling. SEEK IMMEDIATE MEDICAL CARE IF:   You have increased bruising or swelling.  You have pain that is getting worse.  Your swelling or pain is not relieved with medicines. MAKE SURE YOU:   Understand these instructions.  Will watch your condition.  Will get help right away if you are not doing well or get worse. Document Released: 10/15/2004 Document Revised: 03/30/2011 Document Reviewed: 11/10/2010 [facility] Patient Information 2014 [place], [place].
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
## 2012-08-19 DIAGNOSIS — L608 Other nail disorders: Secondary | ICD-10-CM | POA: Diagnosis not present

## 2012-08-19 DIAGNOSIS — E1149 Type 2 diabetes mellitus with other diabetic neurological complication: Secondary | ICD-10-CM | POA: Diagnosis not present

## 2012-08-25 ENCOUNTER — Encounter (HOSPITAL_COMMUNITY): Payer: Self-pay | Admitting: *Deleted

## 2012-08-29 ENCOUNTER — Other Ambulatory Visit: Payer: Self-pay | Admitting: *Deleted

## 2012-08-29 DIAGNOSIS — E119 Type 2 diabetes mellitus without complications: Secondary | ICD-10-CM

## 2012-08-31 ENCOUNTER — Other Ambulatory Visit (INDEPENDENT_AMBULATORY_CARE_PROVIDER_SITE_OTHER): Payer: Medicare Other

## 2012-08-31 DIAGNOSIS — E119 Type 2 diabetes mellitus without complications: Secondary | ICD-10-CM | POA: Diagnosis not present

## 2012-08-31 LAB — COMPREHENSIVE METABOLIC PANEL
ALT: 15 U/L (ref 0–35)
Albumin: 4 g/dL (ref 3.5–5.2)
Alkaline Phosphatase: 62 U/L (ref 39–117)
CO2: 28 mEq/L (ref 19–32)
Glucose, Bld: 129 mg/dL — ABNORMAL HIGH (ref 70–99)
Potassium: 3.4 mEq/L — ABNORMAL LOW (ref 3.5–5.1)
Sodium: 140 mEq/L (ref 135–145)
Total Protein: 7.4 g/dL (ref 6.0–8.3)

## 2012-09-01 DIAGNOSIS — M25519 Pain in unspecified shoulder: Secondary | ICD-10-CM | POA: Diagnosis not present

## 2012-09-01 DIAGNOSIS — M79609 Pain in unspecified limb: Secondary | ICD-10-CM | POA: Diagnosis not present

## 2012-09-01 DIAGNOSIS — M542 Cervicalgia: Secondary | ICD-10-CM | POA: Diagnosis not present

## 2012-09-01 DIAGNOSIS — M549 Dorsalgia, unspecified: Secondary | ICD-10-CM | POA: Diagnosis not present

## 2012-09-01 LAB — URINALYSIS
Bilirubin Urine: NEGATIVE
Hgb urine dipstick: NEGATIVE
Ketones, ur: NEGATIVE
Nitrite: NEGATIVE
Urine Glucose: NEGATIVE

## 2012-09-02 ENCOUNTER — Encounter: Payer: Self-pay | Admitting: Endocrinology

## 2012-09-02 ENCOUNTER — Ambulatory Visit (INDEPENDENT_AMBULATORY_CARE_PROVIDER_SITE_OTHER): Payer: Medicare Other | Admitting: Endocrinology

## 2012-09-02 VITALS — BP 126/64 | HR 93 | Temp 98.2°F | Resp 12 | Ht 66.0 in | Wt 194.1 lb

## 2012-09-02 DIAGNOSIS — E876 Hypokalemia: Secondary | ICD-10-CM
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
## 2012-09-02 NOTE — Patient Instructions (Addendum)
Please check blood sugars at least half the time about 2 hours after any meal and every 2 daily on waking up. Please bring blood sugar monitor to each visit  Walk as much as tolerated  Increase potassium to 3 daily

## 2012-09-06 DIAGNOSIS — F17200 Nicotine dependence, unspecified, uncomplicated: Secondary | ICD-10-CM | POA: Diagnosis not present

## 2012-09-06 DIAGNOSIS — I10 Essential (primary) hypertension: Secondary | ICD-10-CM | POA: Diagnosis not present

## 2012-09-06 DIAGNOSIS — F329 Major depressive disorder, single episode, unspecified: Secondary | ICD-10-CM | POA: Diagnosis not present

## 2012-09-06 DIAGNOSIS — E119 Type 2 diabetes mellitus without complications: Secondary | ICD-10-CM | POA: Diagnosis not present

## 2012-09-06 DIAGNOSIS — J449 Chronic obstructive pulmonary disease, unspecified: Secondary | ICD-10-CM | POA: Diagnosis not present

## 2012-09-06 DIAGNOSIS — J3489 Other specified disorders of nose and nasal sinuses: Secondary | ICD-10-CM | POA: Diagnosis not present

## 2012-09-06 DIAGNOSIS — K219 Gastro-esophageal reflux disease without esophagitis: Secondary | ICD-10-CM | POA: Diagnosis not present

## 2012-09-06 DIAGNOSIS — G479 Sleep disorder, unspecified: Secondary | ICD-10-CM | POA: Diagnosis not present

## 2012-09-07 ENCOUNTER — Encounter (HOSPITAL_COMMUNITY): Payer: Self-pay | Admitting: Pharmacy Technician

## 2012-09-07 ENCOUNTER — Other Ambulatory Visit (HOSPITAL_COMMUNITY): Payer: Self-pay | Admitting: Chiropractic Medicine

## 2012-09-07 DIAGNOSIS — R52 Pain, unspecified: Secondary | ICD-10-CM

## 2012-09-14 ENCOUNTER — Ambulatory Visit (HOSPITAL_COMMUNITY)
Admission: RE | Admit: 2012-09-14 | Discharge: 2012-09-14 | Disposition: A | Discharge status: Home / Self Care | Payer: Medicare Other | Source: Ambulatory Visit | Attending: Chiropractic Medicine | Admitting: Chiropractic Medicine

## 2012-09-14 DIAGNOSIS — M1710 Unilateral primary osteoarthritis, unspecified knee: Secondary | ICD-10-CM | POA: Diagnosis not present

## 2012-09-14 DIAGNOSIS — M25469 Effusion, unspecified knee: Secondary | ICD-10-CM | POA: Diagnosis not present

## 2012-09-14 DIAGNOSIS — M7120 Synovial cyst of popliteal space [Baker], unspecified knee: Secondary | ICD-10-CM | POA: Insufficient documentation

## 2012-09-14 DIAGNOSIS — M2340 Loose body in knee, unspecified knee: Secondary | ICD-10-CM | POA: Insufficient documentation

## 2012-09-14 DIAGNOSIS — R52 Pain, unspecified: Secondary | ICD-10-CM

## 2012-09-21 DIAGNOSIS — F331 Major depressive disorder, recurrent, moderate: Secondary | ICD-10-CM | POA: Diagnosis not present

## 2012-09-27 ENCOUNTER — Encounter (HOSPITAL_COMMUNITY): Payer: Self-pay | Admitting: Anesthesiology

## 2012-09-27 ENCOUNTER — Ambulatory Visit (HOSPITAL_COMMUNITY): Payer: Medicare Other | Admitting: Anesthesiology

## 2012-09-27 ENCOUNTER — Encounter (HOSPITAL_COMMUNITY): Payer: Self-pay

## 2012-09-27 ENCOUNTER — Ambulatory Visit (HOSPITAL_COMMUNITY)
Admission: RE | Admit: 2012-09-27 | Discharge: 2012-09-27 | Disposition: A | Discharge status: Home / Self Care | Payer: Medicare Other | Source: Ambulatory Visit | Attending: Gastroenterology | Admitting: Gastroenterology

## 2012-09-27 ENCOUNTER — Encounter (HOSPITAL_COMMUNITY)
Admission: RE | Disposition: A | Discharge status: Home / Self Care | Payer: Medicare Other | Source: Ambulatory Visit | Attending: Gastroenterology

## 2012-09-27 DIAGNOSIS — K219 Gastro-esophageal reflux disease without esophagitis: Secondary | ICD-10-CM | POA: Insufficient documentation

## 2012-09-27 DIAGNOSIS — E119 Type 2 diabetes mellitus without complications: Secondary | ICD-10-CM | POA: Diagnosis not present

## 2012-09-27 DIAGNOSIS — Z1211 Encounter for screening for malignant neoplasm of colon: Secondary | ICD-10-CM | POA: Diagnosis not present

## 2012-09-27 DIAGNOSIS — I10 Essential (primary) hypertension: Secondary | ICD-10-CM | POA: Diagnosis not present

## 2012-09-27 DIAGNOSIS — Z79899 Other long term (current) drug therapy: Secondary | ICD-10-CM | POA: Diagnosis not present

## 2012-09-27 DIAGNOSIS — K5730 Diverticulosis of large intestine without perforation or abscess without bleeding: Secondary | ICD-10-CM | POA: Insufficient documentation

## 2012-09-27 DIAGNOSIS — Z86010 Personal history of colonic polyps: Secondary | ICD-10-CM | POA: Diagnosis not present

## 2012-09-27 HISTORY — PX: COLONOSCOPY WITH PROPOFOL: SHX5780

## 2012-09-27 SURGERY — COLONOSCOPY WITH PROPOFOL
Anesthesia: Monitor Anesthesia Care

## 2012-09-27 MED ORDER — SODIUM CHLORIDE 0.9 % IV SOLN: INTRAVENOUS | Status: DC

## 2012-09-27 MED ORDER — LACTATED RINGERS IV SOLN
INTRAVENOUS | Status: DC
  Administered 2012-09-27: 1000 mL via INTRAVENOUS

## 2012-09-27 MED ORDER — LACTATED RINGERS IV SOLN
INTRAVENOUS | Status: DC | PRN
  Administered 2012-09-27: 11:00:00 via INTRAVENOUS

## 2012-09-27 MED ORDER — KETAMINE HCL 50 MG/ML IJ SOLN
INTRAMUSCULAR | Status: DC | PRN
  Administered 2012-09-27: 25 mg via INTRAMUSCULAR

## 2012-09-27 MED ORDER — PROPOFOL 10 MG/ML IV BOLUS
INTRAVENOUS | Status: DC | PRN
  Administered 2012-09-27: 50 mg via INTRAVENOUS
  Administered 2012-09-27 (×2): 25 mg via INTRAVENOUS

## 2012-09-27 MED ORDER — PROMETHAZINE HCL 25 MG/ML IJ SOLN: 6.2500 mg | INTRAMUSCULAR | Status: DC | PRN

## 2012-09-27 SURGICAL SUPPLY — 22 items

## 2012-09-27 NOTE — Preoperative (Signed)
Beta Blockers   Reason not to administer Beta Blockers:Not Applicable 

## 2012-09-27 NOTE — Anesthesia Postprocedure Evaluation (Signed)
  Anesthesia Post-op Note  Patient: Leslie Gamble  Procedure(s) Performed: Procedure(s) (LRB): COLONOSCOPY WITH PROPOFOL (N/A)  Patient Location: PACU  Anesthesia Type: MAC  Level of Consciousness: awake and alert   Airway and Oxygen Therapy: Patient Spontanous Breathing  Post-op Pain: mild  Post-op Assessment: Post-op Vital signs reviewed, Patient's Cardiovascular Status Stable, Respiratory Function Stable, Patent Airway and No signs of Nausea or vomiting  Last Vitals:  Filed Vitals:   09/27/12 1202  BP: 130/67  Pulse: 69  Temp: 36.6 C  Resp: 16    Post-op Vital Signs: stable   Complications: No apparent anesthesia complications

## 2012-09-27 NOTE — Transfer of Care (Signed)
Immediate Anesthesia Transfer of Care Note  Patient: Leslie Gamble  Procedure(s) Performed: Procedure(s): COLONOSCOPY WITH PROPOFOL (N/A)  Patient Location: PACU  Anesthesia Type:MAC  Level of Consciousness: awake, oriented, sedated and patient cooperative  Airway & Oxygen Therapy: Patient Spontanous Breathing and Patient connected to face mask oxygen  Post-op Assessment: Report given to PACU RN and Post -op Vital signs reviewed and stable  Post vital signs: Reviewed and stable  Complications: No apparent anesthesia complications

## 2012-09-27 NOTE — Anesthesia Preprocedure Evaluation (Signed)
Anesthesia Evaluation  Patient identified by MRN, date of birth, ID band Patient awake    Reviewed: Allergy & Precautions, H&P , NPO status , Patient's Chart, lab work & pertinent test results  Airway Mallampati: II TM Distance: >3 FB Neck ROM: Full    Dental no notable dental hx.    Pulmonary asthma , Current Smoker,  breath sounds clear to auscultation  Pulmonary exam normal       Cardiovascular hypertension, Pt. on medications Rhythm:Regular Rate:Normal     Neuro/Psych negative neurological ROS  negative psych ROS   GI/Hepatic negative GI ROS, Neg liver ROS,   Endo/Other  diabetes, Type 2  Renal/GU negative Renal ROS  negative genitourinary   Musculoskeletal negative musculoskeletal ROS (+)   Abdominal   Peds negative pediatric ROS (+)  Hematology negative hematology ROS (+)   Anesthesia Other Findings   Reproductive/Obstetrics negative OB ROS                           Anesthesia Physical Anesthesia Plan  ASA: III  Anesthesia Plan: MAC   Post-op Pain Management:    Induction: Intravenous  Airway Management Planned: Nasal Cannula  Additional Equipment:   Intra-op Plan:   Post-operative Plan:   Informed Consent: I have reviewed the patients History and Physical, chart, labs and discussed the procedure including the risks, benefits and alternatives for the proposed anesthesia with the patient or authorized representative who has indicated his/her understanding and acceptance.     Plan Discussed with: CRNA and Surgeon  Anesthesia Plan Comments:         Anesthesia Quick Evaluation

## 2012-09-27 NOTE — Op Note (Signed)
Procedure: Screening colonoscopy  Endoscopist: Danise Edge  Premedication: Propofol administered by anesthesia  Procedure: The patient was placed in the left lateral decubitus position. Anal inspection and digital rectal exam were normal. The Pentax pediatric colonoscope was introduced into the rectum and easily advanced to the cecum. A normal-appearing appendiceal orifice and ileocecal valve were identified. Colonic preparation for the exam today was good.  Universal colonic diverticulosis was present.  Rectum. Normal. Retroflexed view of the distal rectum normal.  Sigmoid colon and descending colon. Normal. The patient has had surgery for a perforated sigmoid diverticulum.  Splenic flexure. Normal.  Transverse colon. Normal.  Hepatic flexure. Normal.  Ascending colon. Normal.  Cecum and ileocecal valve. Normal.  Assessment:  #1. Universal colonic diverticulosis  #2. Otherwise normal screening proctocolonoscopy to the cecum.  #3. Normal screening colonoscopy on 03/24/2002.  Recommendations: The patient probably does not require repeat  screening colonoscopy.

## 2012-09-27 NOTE — H&P (Signed)
  Procedure: Screening colonoscopy  History: The patient is a 72 year old female born October 12, 1940. In March 2004, the patient underwent a normal screening colonoscopy. She is scheduled to undergo a repeat screening colonoscopy today.  In 1993, the patient requires surgery to treat perforated sigmoid diverticulum. She chronically takes alprazolam and oxycodone. The patient will receive propofol sedation today.  Past medical history: Type 2 diabetes mellitus. Hypertension. Osteoarthritis. Degenerative joint disease of the lumbar spine. Major depression. Gastroesophageal reflux. Chronic insomnia. Asthma. Perforated colonic diverticulum requiring sigmoid colon surgery. Osteopenia. Osteoarthritis of the knee. Right shoulder surgery.  Exam: The patient is alert and lying comfortably on the endoscopy stretcher. Abdomen is soft and nontender to palpation. Cardiac exam reveals a regular rhythm. Lungs are clear to auscultation.  Plan: Proceed with screening colonoscopy using propofol sedation.

## 2012-09-28 ENCOUNTER — Encounter (HOSPITAL_COMMUNITY): Payer: Self-pay | Admitting: Gastroenterology

## 2012-10-04 DIAGNOSIS — E119 Type 2 diabetes mellitus without complications: Secondary | ICD-10-CM | POA: Diagnosis not present

## 2012-10-04 DIAGNOSIS — E11319 Type 2 diabetes mellitus with unspecified diabetic retinopathy without macular edema: Secondary | ICD-10-CM | POA: Diagnosis not present

## 2012-10-20 DIAGNOSIS — M545 Low back pain: Secondary | ICD-10-CM | POA: Diagnosis not present

## 2012-10-20 DIAGNOSIS — M1710 Unilateral primary osteoarthritis, unspecified knee: Secondary | ICD-10-CM | POA: Diagnosis not present

## 2012-10-20 DIAGNOSIS — M47812 Spondylosis without myelopathy or radiculopathy, cervical region: Secondary | ICD-10-CM | POA: Diagnosis not present

## 2012-10-25 DIAGNOSIS — M47812 Spondylosis without myelopathy or radiculopathy, cervical region: Secondary | ICD-10-CM | POA: Diagnosis not present

## 2012-11-01 DIAGNOSIS — M47812 Spondylosis without myelopathy or radiculopathy, cervical region: Secondary | ICD-10-CM | POA: Diagnosis not present

## 2012-11-16 DIAGNOSIS — M545 Low back pain: Secondary | ICD-10-CM | POA: Diagnosis not present

## 2012-11-16 DIAGNOSIS — M47812 Spondylosis without myelopathy or radiculopathy, cervical region: Secondary | ICD-10-CM | POA: Diagnosis not present

## 2012-11-22 DIAGNOSIS — M47812 Spondylosis without myelopathy or radiculopathy, cervical region: Secondary | ICD-10-CM | POA: Diagnosis not present

## 2012-11-22 DIAGNOSIS — M545 Low back pain: Secondary | ICD-10-CM | POA: Diagnosis not present

## 2012-11-23 DIAGNOSIS — M545 Low back pain: Secondary | ICD-10-CM | POA: Diagnosis not present

## 2012-11-23 DIAGNOSIS — M47812 Spondylosis without myelopathy or radiculopathy, cervical region: Secondary | ICD-10-CM | POA: Diagnosis not present

## 2012-11-24 DIAGNOSIS — F331 Major depressive disorder, recurrent, moderate: Secondary | ICD-10-CM | POA: Diagnosis not present

## 2012-11-28 DIAGNOSIS — M545 Low back pain: Secondary | ICD-10-CM | POA: Diagnosis not present

## 2012-11-28 DIAGNOSIS — M47812 Spondylosis without myelopathy or radiculopathy, cervical region: Secondary | ICD-10-CM | POA: Diagnosis not present

## 2012-11-30 DIAGNOSIS — M545 Low back pain: Secondary | ICD-10-CM | POA: Diagnosis not present

## 2012-11-30 DIAGNOSIS — M47812 Spondylosis without myelopathy or radiculopathy, cervical region: Secondary | ICD-10-CM | POA: Diagnosis not present

## 2012-12-05 DIAGNOSIS — M545 Low back pain: Secondary | ICD-10-CM | POA: Diagnosis not present

## 2012-12-05 DIAGNOSIS — M47812 Spondylosis without myelopathy or radiculopathy, cervical region: Secondary | ICD-10-CM | POA: Diagnosis not present

## 2012-12-07 DIAGNOSIS — M545 Low back pain: Secondary | ICD-10-CM | POA: Diagnosis not present

## 2012-12-07 DIAGNOSIS — M47812 Spondylosis without myelopathy or radiculopathy, cervical region: Secondary | ICD-10-CM | POA: Diagnosis not present

## 2012-12-11 ENCOUNTER — Emergency Department (HOSPITAL_COMMUNITY)
Admission: EM | Admit: 2012-12-11 | Discharge: 2012-12-11 | Disposition: A | Discharge status: Home / Self Care | Payer: Medicare Other | Attending: Emergency Medicine | Admitting: Emergency Medicine

## 2012-12-11 ENCOUNTER — Emergency Department (HOSPITAL_COMMUNITY): Payer: Medicare Other

## 2012-12-11 ENCOUNTER — Encounter (HOSPITAL_COMMUNITY): Payer: Self-pay | Admitting: Emergency Medicine

## 2012-12-11 DIAGNOSIS — R110 Nausea: Secondary | ICD-10-CM | POA: Insufficient documentation

## 2012-12-11 DIAGNOSIS — F3289 Other specified depressive episodes: Secondary | ICD-10-CM | POA: Insufficient documentation

## 2012-12-11 DIAGNOSIS — Z8719 Personal history of other diseases of the digestive system: Secondary | ICD-10-CM | POA: Diagnosis not present

## 2012-12-11 DIAGNOSIS — M129 Arthropathy, unspecified: Secondary | ICD-10-CM | POA: Diagnosis not present

## 2012-12-11 DIAGNOSIS — Z880 Allergy status to penicillin: Secondary | ICD-10-CM | POA: Diagnosis not present

## 2012-12-11 DIAGNOSIS — F329 Major depressive disorder, single episode, unspecified: Secondary | ICD-10-CM | POA: Insufficient documentation

## 2012-12-11 DIAGNOSIS — E119 Type 2 diabetes mellitus without complications: Secondary | ICD-10-CM | POA: Diagnosis not present

## 2012-12-11 DIAGNOSIS — I10 Essential (primary) hypertension: Secondary | ICD-10-CM | POA: Diagnosis not present

## 2012-12-11 DIAGNOSIS — Z79899 Other long term (current) drug therapy: Secondary | ICD-10-CM | POA: Insufficient documentation

## 2012-12-11 DIAGNOSIS — J45909 Unspecified asthma, uncomplicated: Secondary | ICD-10-CM | POA: Diagnosis not present

## 2012-12-11 DIAGNOSIS — R51 Headache: Secondary | ICD-10-CM | POA: Diagnosis not present

## 2012-12-11 DIAGNOSIS — H53149 Visual discomfort, unspecified: Secondary | ICD-10-CM | POA: Insufficient documentation

## 2012-12-11 DIAGNOSIS — F17200 Nicotine dependence, unspecified, uncomplicated: Secondary | ICD-10-CM | POA: Insufficient documentation

## 2012-12-11 DIAGNOSIS — Z791 Long term (current) use of non-steroidal anti-inflammatories (NSAID): Secondary | ICD-10-CM | POA: Diagnosis not present

## 2012-12-11 LAB — URINALYSIS, ROUTINE W REFLEX MICROSCOPIC
Hgb urine dipstick: NEGATIVE
Ketones, ur: NEGATIVE mg/dL
Leukocytes, UA: NEGATIVE
Nitrite: NEGATIVE
Protein, ur: 30 mg/dL — AB
pH: 5 (ref 5.0–8.0)

## 2012-12-11 LAB — CBC
HCT: 37.9 % (ref 36.0–46.0)
MCH: 26.6 pg (ref 26.0–34.0)
MCV: 81.3 fL (ref 78.0–100.0)
RDW: 16.6 % — ABNORMAL HIGH (ref 11.5–15.5)
WBC: 9.2 10*3/uL (ref 4.0–10.5)

## 2012-12-11 LAB — BASIC METABOLIC PANEL
BUN: 19 mg/dL (ref 6–23)
Calcium: 9.4 mg/dL (ref 8.4–10.5)
Chloride: 98 mEq/L (ref 96–112)
Creatinine, Ser: 0.6 mg/dL (ref 0.50–1.10)
GFR calc Af Amer: >90 mL/min (ref >90)

## 2012-12-11 LAB — URINE MICROSCOPIC-ADD ON
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
## 2012-12-11 NOTE — ED Notes (Signed)
Pt states HA x 4 hrs that feels like pressure, with photophobia, nausea, weakness, blurry vision.

## 2012-12-11 NOTE — ED Notes (Signed)
Pt returned from radiology.

## 2012-12-13 DIAGNOSIS — J3489 Other specified disorders of nose and nasal sinuses: Secondary | ICD-10-CM | POA: Diagnosis not present

## 2012-12-13 DIAGNOSIS — R51 Headache: Secondary | ICD-10-CM | POA: Diagnosis not present

## 2012-12-13 DIAGNOSIS — M5090 Cervical disc disorder, unspecified, unspecified cervical region: Secondary | ICD-10-CM | POA: Diagnosis not present

## 2012-12-13 DIAGNOSIS — Z23 Encounter for immunization: Secondary | ICD-10-CM | POA: Diagnosis not present

## 2012-12-14 DIAGNOSIS — M545 Low back pain: Secondary | ICD-10-CM | POA: Diagnosis not present

## 2012-12-14 DIAGNOSIS — M47812 Spondylosis without myelopathy or radiculopathy, cervical region: Secondary | ICD-10-CM | POA: Diagnosis not present

## 2012-12-15 NOTE — ED Provider Notes (Signed)
Medical screening examination/treatment/procedure(s) were conducted as a shared visit with non-physician practitioner(s) and myself.  I personally evaluated the patient during the encounter.  EKG Interpretation   None       Patient presents with headache. Patient was not exhibiting any other neurologic symptoms, neurologic examination was unremarkable. No infectious-type symptoms, no concern for meningitis. Low concern for subarachnoid hemorrhage. CT head unremarkable. Patient improving without intervention. Headache of unclear etiology, no concern for significant medical condition.  Gilda Crease, MD 12/15/12 [phone]

## 2012-12-19 DIAGNOSIS — M47812 Spondylosis without myelopathy or radiculopathy, cervical region: Secondary | ICD-10-CM | POA: Diagnosis not present

## 2012-12-19 DIAGNOSIS — M545 Low back pain: Secondary | ICD-10-CM | POA: Diagnosis not present

## 2012-12-21 DIAGNOSIS — F331 Major depressive disorder, recurrent, moderate: Secondary | ICD-10-CM | POA: Diagnosis not present

## 2012-12-23 DIAGNOSIS — M1710 Unilateral primary osteoarthritis, unspecified knee: Secondary | ICD-10-CM | POA: Diagnosis not present

## 2012-12-23 DIAGNOSIS — M47812 Spondylosis without myelopathy or radiculopathy, cervical region: Secondary | ICD-10-CM | POA: Diagnosis not present

## 2012-12-23 DIAGNOSIS — M545 Low back pain: Secondary | ICD-10-CM | POA: Diagnosis not present

## 2012-12-26 DIAGNOSIS — M545 Low back pain: Secondary | ICD-10-CM | POA: Diagnosis not present

## 2012-12-26 DIAGNOSIS — M1710 Unilateral primary osteoarthritis, unspecified knee: Secondary | ICD-10-CM | POA: Diagnosis not present

## 2012-12-26 DIAGNOSIS — M47812 Spondylosis without myelopathy or radiculopathy, cervical region: Secondary | ICD-10-CM | POA: Diagnosis not present

## 2012-12-27 ENCOUNTER — Other Ambulatory Visit: Payer: Medicare Other

## 2012-12-27 ENCOUNTER — Other Ambulatory Visit (INDEPENDENT_AMBULATORY_CARE_PROVIDER_SITE_OTHER): Payer: Medicare Other

## 2012-12-27 LAB — LIPID PANEL
Cholesterol: 186 mg/dL (ref 0–200)
LDL Cholesterol: 97 mg/dL (ref 0–99)
Total CHOL/HDL Ratio: 3
Triglycerides: 112 mg/dL (ref 0.0–149.0)

## 2012-12-27 LAB — COMPREHENSIVE METABOLIC PANEL
ALT: 14 U/L (ref 0–35)
AST: 17 U/L (ref 0–37)
Albumin: 4.1 g/dL (ref 3.5–5.2)
Calcium: 9.4 mg/dL (ref 8.4–10.5)
Chloride: 101 mEq/L (ref 96–112)
Creatinine, Ser: 0.7 mg/dL (ref 0.4–1.2)
Glucose, Bld: 161 mg/dL — ABNORMAL HIGH (ref 70–99)
Potassium: 3.6 mEq/L (ref 3.5–5.1)
Total Protein: 7.9 g/dL (ref 6.0–8.3)

## 2012-12-27 LAB — HEMOGLOBIN A1C: Hgb A1c MFr Bld: 7.7 % — ABNORMAL HIGH (ref 4.6–6.5)

## 2012-12-29 ENCOUNTER — Encounter: Payer: Self-pay | Admitting: Endocrinology

## 2012-12-29 ENCOUNTER — Ambulatory Visit (INDEPENDENT_AMBULATORY_CARE_PROVIDER_SITE_OTHER): Payer: Medicare Other | Admitting: Endocrinology

## 2012-12-29 VITALS — BP 124/62 | HR 100 | Temp 98.2°F | Resp 12 | Ht 66.0 in | Wt 196.8 lb

## 2012-12-29 DIAGNOSIS — E876 Hypokalemia: Secondary | ICD-10-CM

## 2012-12-29 DIAGNOSIS — I10 Essential (primary) hypertension: Secondary | ICD-10-CM

## 2012-12-29 DIAGNOSIS — IMO0001 Reserved for inherently not codable concepts without codable children: Secondary | ICD-10-CM

## 2012-12-29 NOTE — Patient Instructions (Addendum)
Please check blood sugars at least half the time about 2 hours after any meal and as directed on waking up.   Please bring blood sugar monitor to each visit  Reduce Victoza to 1.2mg  and take in ams
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
## 2012-12-30 ENCOUNTER — Other Ambulatory Visit: Payer: Medicare Other

## 2013-01-02 ENCOUNTER — Other Ambulatory Visit: Payer: Self-pay | Admitting: *Deleted

## 2013-01-02 DIAGNOSIS — M545 Low back pain: Secondary | ICD-10-CM | POA: Diagnosis not present

## 2013-01-02 DIAGNOSIS — M47812 Spondylosis without myelopathy or radiculopathy, cervical region: Secondary | ICD-10-CM | POA: Diagnosis not present

## 2013-01-02 MED ORDER — GLUCOSE BLOOD VI STRP: ORAL_STRIP | Status: DC

## 2013-01-03 ENCOUNTER — Other Ambulatory Visit: Payer: Self-pay | Admitting: *Deleted

## 2013-01-03 DIAGNOSIS — M545 Low back pain: Secondary | ICD-10-CM | POA: Diagnosis not present

## 2013-01-03 DIAGNOSIS — M47812 Spondylosis without myelopathy or radiculopathy, cervical region: Secondary | ICD-10-CM | POA: Diagnosis not present

## 2013-01-03 MED ORDER — ONETOUCH DELICA LANCETS FINE MISC: Status: DC

## 2013-01-04 ENCOUNTER — Ambulatory Visit: Payer: Medicare Other | Admitting: Endocrinology

## 2013-01-04 DIAGNOSIS — M545 Low back pain: Secondary | ICD-10-CM | POA: Diagnosis not present

## 2013-01-04 DIAGNOSIS — M47812 Spondylosis without myelopathy or radiculopathy, cervical region: Secondary | ICD-10-CM | POA: Diagnosis not present

## 2013-01-09 DIAGNOSIS — M545 Low back pain: Secondary | ICD-10-CM | POA: Diagnosis not present

## 2013-01-09 DIAGNOSIS — M47812 Spondylosis without myelopathy or radiculopathy, cervical region: Secondary | ICD-10-CM | POA: Diagnosis not present

## 2013-01-16 DIAGNOSIS — M545 Low back pain: Secondary | ICD-10-CM | POA: Diagnosis not present

## 2013-01-16 DIAGNOSIS — M47812 Spondylosis without myelopathy or radiculopathy, cervical region: Secondary | ICD-10-CM | POA: Diagnosis not present

## 2013-01-18 DIAGNOSIS — M47812 Spondylosis without myelopathy or radiculopathy, cervical region: Secondary | ICD-10-CM | POA: Diagnosis not present

## 2013-01-18 DIAGNOSIS — M545 Low back pain: Secondary | ICD-10-CM | POA: Diagnosis not present

## 2013-01-24 DIAGNOSIS — J019 Acute sinusitis, unspecified: Secondary | ICD-10-CM | POA: Diagnosis not present

## 2013-02-01 DIAGNOSIS — M545 Low back pain, unspecified: Secondary | ICD-10-CM | POA: Diagnosis not present

## 2013-02-01 DIAGNOSIS — M47812 Spondylosis without myelopathy or radiculopathy, cervical region: Secondary | ICD-10-CM | POA: Diagnosis not present

## 2013-02-01 DIAGNOSIS — IMO0002 Reserved for concepts with insufficient information to code with codable children: Secondary | ICD-10-CM | POA: Diagnosis not present

## 2013-02-06 ENCOUNTER — Other Ambulatory Visit: Payer: Self-pay | Admitting: *Deleted

## 2013-02-06 MED ORDER — LIRAGLUTIDE 18 MG/3ML ~~LOC~~ SOPN: 1.8000 mg | PEN_INJECTOR | Freq: Every day | SUBCUTANEOUS | Status: DC

## 2013-02-09 DIAGNOSIS — M47817 Spondylosis without myelopathy or radiculopathy, lumbosacral region: Secondary | ICD-10-CM | POA: Diagnosis not present

## 2013-02-15 DIAGNOSIS — F17200 Nicotine dependence, unspecified, uncomplicated: Secondary | ICD-10-CM | POA: Diagnosis not present

## 2013-02-15 DIAGNOSIS — E119 Type 2 diabetes mellitus without complications: Secondary | ICD-10-CM | POA: Diagnosis not present

## 2013-02-15 DIAGNOSIS — J449 Chronic obstructive pulmonary disease, unspecified: Secondary | ICD-10-CM | POA: Diagnosis not present

## 2013-02-15 DIAGNOSIS — K219 Gastro-esophageal reflux disease without esophagitis: Secondary | ICD-10-CM | POA: Diagnosis not present

## 2013-02-15 DIAGNOSIS — I10 Essential (primary) hypertension: Secondary | ICD-10-CM | POA: Diagnosis not present

## 2013-02-16 DIAGNOSIS — M48061 Spinal stenosis, lumbar region without neurogenic claudication: Secondary | ICD-10-CM | POA: Diagnosis not present

## 2013-02-16 DIAGNOSIS — M5126 Other intervertebral disc displacement, lumbar region: Secondary | ICD-10-CM | POA: Diagnosis not present

## 2013-02-16 DIAGNOSIS — IMO0002 Reserved for concepts with insufficient information to code with codable children: Secondary | ICD-10-CM | POA: Diagnosis not present

## 2013-02-24 DIAGNOSIS — M5126 Other intervertebral disc displacement, lumbar region: Secondary | ICD-10-CM | POA: Diagnosis not present

## 2013-02-24 DIAGNOSIS — IMO0002 Reserved for concepts with insufficient information to code with codable children: Secondary | ICD-10-CM | POA: Diagnosis not present

## 2013-03-15 DIAGNOSIS — F331 Major depressive disorder, recurrent, moderate: Secondary | ICD-10-CM | POA: Diagnosis not present

## 2013-03-17 DIAGNOSIS — IMO0002 Reserved for concepts with insufficient information to code with codable children: Secondary | ICD-10-CM | POA: Diagnosis not present

## 2013-03-17 DIAGNOSIS — M48061 Spinal stenosis, lumbar region without neurogenic claudication: Secondary | ICD-10-CM | POA: Diagnosis not present

## 2013-03-17 DIAGNOSIS — M5126 Other intervertebral disc displacement, lumbar region: Secondary | ICD-10-CM | POA: Diagnosis not present

## 2013-03-22 ENCOUNTER — Other Ambulatory Visit: Payer: Self-pay | Admitting: Internal Medicine

## 2013-03-22 MED ORDER — MOMETASONE FURO-FORMOTEROL FUM 100-5 MCG/ACT IN AERO: 2.0000 | INHALATION_SPRAY | Freq: Two times a day (BID) | RESPIRATORY_TRACT | Status: DC

## 2013-03-29 ENCOUNTER — Other Ambulatory Visit: Payer: Medicare Other

## 2013-04-03 DIAGNOSIS — H16229 Keratoconjunctivitis sicca, not specified as Sjogren's, unspecified eye: Secondary | ICD-10-CM | POA: Diagnosis not present

## 2013-04-03 DIAGNOSIS — E1139 Type 2 diabetes mellitus with other diabetic ophthalmic complication: Secondary | ICD-10-CM | POA: Diagnosis not present

## 2013-04-03 DIAGNOSIS — H2510 Age-related nuclear cataract, unspecified eye: Secondary | ICD-10-CM | POA: Diagnosis not present

## 2013-04-04 DIAGNOSIS — IMO0002 Reserved for concepts with insufficient information to code with codable children: Secondary | ICD-10-CM | POA: Diagnosis not present

## 2013-04-04 DIAGNOSIS — M76899 Other specified enthesopathies of unspecified lower limb, excluding foot: Secondary | ICD-10-CM | POA: Diagnosis not present

## 2013-04-04 DIAGNOSIS — M5126 Other intervertebral disc displacement, lumbar region: Secondary | ICD-10-CM | POA: Diagnosis not present

## 2013-04-04 DIAGNOSIS — M48061 Spinal stenosis, lumbar region without neurogenic claudication: Secondary | ICD-10-CM | POA: Diagnosis not present

## 2013-04-05 ENCOUNTER — Other Ambulatory Visit: Payer: Self-pay | Admitting: Endocrinology

## 2013-04-05 ENCOUNTER — Encounter: Payer: Self-pay | Admitting: Endocrinology

## 2013-04-05 ENCOUNTER — Ambulatory Visit (INDEPENDENT_AMBULATORY_CARE_PROVIDER_SITE_OTHER): Payer: Medicare Other | Admitting: Endocrinology

## 2013-04-05 VITALS — BP 142/84 | HR 100 | Ht 66.0 in | Wt 194.6 lb

## 2013-04-05 DIAGNOSIS — I10 Essential (primary) hypertension: Secondary | ICD-10-CM | POA: Diagnosis not present

## 2013-04-05 DIAGNOSIS — E1165 Type 2 diabetes mellitus with hyperglycemia: Principal | ICD-10-CM

## 2013-04-05 DIAGNOSIS — IMO0001 Reserved for inherently not codable concepts without codable children: Secondary | ICD-10-CM

## 2013-04-05 LAB — BASIC METABOLIC PANEL
BUN: 17 mg/dL (ref 6–23)
CALCIUM: 10 mg/dL (ref 8.4–10.5)
CHLORIDE: 100 meq/L (ref 96–112)
CO2: 27 meq/L (ref 19–32)
Creat: 0.56 mg/dL (ref 0.50–1.10)
GLUCOSE: 244 mg/dL — AB (ref 70–99)
POTASSIUM: 3.9 meq/L (ref 3.5–5.3)
SODIUM: 139 meq/L (ref 135–145)

## 2013-04-05 LAB — HEMOGLOBIN A1C
Hgb A1c MFr Bld: 6.9 % — ABNORMAL HIGH (ref <5.7)
Mean Plasma Glucose: 151 mg/dL — ABNORMAL HIGH (ref <117)

## 2013-04-05 NOTE — Patient Instructions (Signed)
Check to see if Bydureon or Trulicity is better covered
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
## 2013-04-12 ENCOUNTER — Encounter: Payer: Self-pay | Admitting: Gynecology

## 2013-04-12 ENCOUNTER — Ambulatory Visit (INDEPENDENT_AMBULATORY_CARE_PROVIDER_SITE_OTHER): Payer: Medicare Other | Admitting: Gynecology

## 2013-04-12 VITALS — BP 130/84

## 2013-04-12 DIAGNOSIS — N760 Acute vaginitis: Secondary | ICD-10-CM | POA: Diagnosis not present

## 2013-04-12 DIAGNOSIS — A499 Bacterial infection, unspecified: Secondary | ICD-10-CM

## 2013-04-12 DIAGNOSIS — B9689 Other specified bacterial agents as the cause of diseases classified elsewhere: Secondary | ICD-10-CM | POA: Diagnosis not present

## 2013-04-12 DIAGNOSIS — N898 Other specified noninflammatory disorders of vagina: Secondary | ICD-10-CM | POA: Diagnosis not present

## 2013-04-12 LAB — WET PREP FOR TRICH, YEAST, CLUE
TRICH WET PREP: NONE SEEN
YEAST WET PREP: NONE SEEN

## 2013-04-12 NOTE — Progress Notes (Signed)
   73 year old who presented to the office complaining of vaginal discharge with odor with no rhinitis. Patient not sexually active. Patient overdue for her annual exam and will return back in April.  Exam: Bartholin urethra Skene glands with atrophic changes Vagina: Atrophic changes noted a grey fishy like odor discharge  Cervix: No lesions or discharge Bimanual exam: Not done  Wet prep: Few clue cells a few WBCs and many bacteria  Assessment/plan: Patient will be treated for suspected bacterial vaginosis. Due to patient's multiple allergies she will be placed on boric acid suppositories 600 mg intravaginally each bedtime for 10 days.

## 2013-04-14 ENCOUNTER — Telehealth: Payer: Self-pay | Admitting: *Deleted

## 2013-04-14 MED ORDER — HYDROGEN PEROXIDE 3 % EX SOLN: CUTANEOUS | Status: DC

## 2013-04-14 NOTE — Telephone Encounter (Signed)
Have her try H2O2 (Hydrogen Peroxide 3 % vaginal irrigation 20 cc's qhs for 1 week for 3 mins holding legs back before bedtime

## 2013-04-14 NOTE — Telephone Encounter (Signed)
Pt informed, rx sent 

## 2013-04-14 NOTE — Telephone Encounter (Signed)
Pt was prescribed boric acid suppositories 600 mg intravaginally each bedtime for 10 days on OV 04/12/13. Pt said when she took medication her lips and nose started to swell. Pt has stopped medication. Per your note patient has multiple allergies. Please advise

## 2013-04-25 DIAGNOSIS — IMO0002 Reserved for concepts with insufficient information to code with codable children: Secondary | ICD-10-CM | POA: Diagnosis not present

## 2013-04-25 DIAGNOSIS — M76899 Other specified enthesopathies of unspecified lower limb, excluding foot: Secondary | ICD-10-CM | POA: Diagnosis not present

## 2013-04-25 DIAGNOSIS — M48061 Spinal stenosis, lumbar region without neurogenic claudication: Secondary | ICD-10-CM | POA: Diagnosis not present

## 2013-05-03 ENCOUNTER — Ambulatory Visit (INDEPENDENT_AMBULATORY_CARE_PROVIDER_SITE_OTHER): Payer: Medicare Other | Admitting: Gynecology

## 2013-05-03 ENCOUNTER — Encounter: Payer: Self-pay | Admitting: Gynecology

## 2013-05-03 VITALS — BP 128/84 | Ht 64.0 in | Wt 193.0 lb

## 2013-05-03 DIAGNOSIS — E663 Overweight: Secondary | ICD-10-CM

## 2013-05-03 DIAGNOSIS — N949 Unspecified condition associated with female genital organs and menstrual cycle: Secondary | ICD-10-CM | POA: Diagnosis not present

## 2013-05-03 DIAGNOSIS — N952 Postmenopausal atrophic vaginitis: Secondary | ICD-10-CM | POA: Diagnosis not present

## 2013-05-03 DIAGNOSIS — R102 Pelvic and perineal pain: Secondary | ICD-10-CM

## 2013-05-03 NOTE — Patient Instructions (Addendum)
Seborrheic Keratosis Seborrheic keratosis is a common, noncancerous (benign) skin growth that can occur anywhere on the skin.It looks like "stuck-on," waxy, rough, tan, brown, or black spots on the skin. These skin growths can be flat or raised.They are often called "barnacles" because of their pasted-on appearance.Usually, these skin growths appear in adulthood, around age 71, and increase in number as you age. They may also develop during pregnancy or following estrogen therapy. Many people may only have one growth appear in their lifetime, while some people may develop many growths. CAUSES It is unknown what causes these skin growths, but they appear to run in families. SYMPTOMS Seborrheic keratosis is often located on the face, chest, shoulders, back, or other areas. These growths are:  Usually painless, but may become irritated and itchy.  Yellow, brown, black, or other colors.  Slightly raised or have a flat surface.  Sometimes rough or wart-like in texture.  Often waxy on the surface.  Round or oval-shaped.  Sometimes "stuck-on" in appearance.  Sometimes single, but there are usually many growths. Any growth that bleeds, itches on a regular basis, becomes inflamed, or becomes irritated needs to be evaluated by a skin specialist (dermatologist). DIAGNOSIS Diagnosis is mainly based on the way the growths appear. In some cases, it can be difficult to tell this type of skin growth from skin cancer. A skin growth tissue sample (biopsy) may be used to confirm the diagnosis. TREATMENT Most often, treatment is not needed because the skin growths are benign.If the skin growth is irritated easily by clothing or jewelry, causing it to scab or bleed, treatment may be recommended. Patients may also choose to have the growths removed because they do not like their appearance. Most commonly, these growths are treated with cryosurgery. In cryosurgery, liquid nitrogen is applied to "freeze" the  growth. The growth usually falls off within a matter of days. A blister may form and dry into a scab that will also fall off. After the growth or scab falls off, it may leave a dark or light spot on the skin. This color may fade over time, or it may remain permanent on the skin. HOME CARE INSTRUCTIONS If the skin growths are treated with cryosurgery, the treated area needs to be kept clean with water and soap. SEEK MEDICAL CARE IF:  You have questions about these growths or other skin problems.  You develop new symptoms, including:  A change in the appearance of the skin growth.  New growths.  Any bleeding, itching, or pain in the growths.  A skin growth that looks similar to seborrheic keratosis. Document Released: 02/07/2010 Document Revised: 03/30/2011 Document Reviewed: 02/07/2010 [facility] Patient Information 2014 [place], [place]. Transvaginal Ultrasound Transvaginal ultrasound is a pelvic ultrasound, using a metal probe that is placed in the vagina, to look at a women's female organs. Transvaginal ultrasound is a method of seeing inside the pelvis of a woman. The ultrasound machine sends out sound waves from the transducer (probe). These sound waves bounce off body structures (like an echo) to create a picture. The picture shows up on a monitor. It is called transvaginal because the probe is inserted into the vagina. There should be very little discomfort from the vaginal probe. This test can also be used during pregnancy. Endovaginal ultrasound is another name for a transvaginal ultrasound. In a transabdominal ultrasound, the probe is placed on the outside of the belly. This method gives pictures that are lower quality than pictures from the transvaginal technique. Transvaginal ultrasound is  used to look for problems of the female genital tract. Some such problems include:  Infertility problems.  Congenital (birth defect) malformations of the uterus and ovaries.  Tumors in the  uterus.  Abnormal bleeding.  Ovarian tumors and cysts.  Abscess (inflamed tissue around pus) in the pelvis.  Unexplained abdominal or pelvic pain.  Pelvic infection. DURING PREGNANCY, TRANSVAGINAL ULTRASOUND MAY BE USED TO LOOK AT:  Normal pregnancy.  Ectopic pregnancy (pregnancy outside the uterus).  Fetal heartbeat.  Abnormalities in the pelvis, that are not seen well with transabdominal ultrasound.  Suspected twins or multiples.  Impending miscarriage.  Problems with the cervix (incompetent cervix, not able to stay closed and hold the baby).  When doing an amniocentesis (removing fluid from the pregnancy sac, for testing).  Looking for abnormalities of the baby.  Checking the growth, development, and age of the fetus.  Measuring the amount of fluid in the amniotic sac.  When doing an external version of the baby (moving baby into correct position).  Evaluating the baby for problems in high risk pregnancies (biophysical profile).  Suspected fetal demise (death). Sometimes a special ultrasound method called Saline Infusion Sonography (SIS) is used for a more accurate look at the uterus. Sterile saline (salt water) is injected into the uterus of non-pregnant patients to see the inside of the uterus better. SIS is not used on pregnant women. The vaginal probe can also assist in obtaining biopsies of abnormal areas, in draining fluid from cysts on the ovary, and in finding IUDs (intrauterine device, birth control) that cannot be located. PREPARATION FOR TEST A transvaginal ultrasound is done with the bladder empty. The transabdominal ultrasound is done with your bladder full. You may be asked to drink several glasses of water before that exam. Sometimes, a transabdominal ultrasound is done just after a transvaginal ultrasound, to look at organs in your abdomen. PROCEDURE  You will lie down on a table, with your knees bent and your feet in foot holders. The probe is covered  with a condom. A sterile lubricant is put into the vagina and on the probe. The lubricant helps transmit the sound waves and avoid irritating the vagina. Your caregiver will move the probe inside the vaginal cavity to scan the pelvic structures. A normal test will show a normal pelvis and normal contents. An abnormal test will show abnormalities of the pelvis, placenta, or baby. ABNORMAL RESULTS MAY BE DUE TO:  Growths or tumors in the:  Uterus.  Ovaries.  Vagina.  Other pelvic structures.  Non-cancerous growths of the uterus and ovaries.  Twisting of the ovary, cutting off blood supply to the ovary (ovarian torsion).  Areas of infection, including:  Pelvic inflammatory disease.  Abscess in the pelvis.  Locating an IUD. PROBLEMS FOUND IN PREGNANT WOMEN MAY INCLUDE:  Ectopic pregnancy (pregnancy outside the uterus).  Multiple pregnancies.  Early dilation (opening) of the cervix. This may indicate an incompetent cervix and early delivery.  Impending miscarriage.  Fetal death.  Problems with the placenta, including:  Placenta has grown over the opening of the womb (placenta previa).  Placenta has separated early in the womb (placental abruption).  Placenta grows into the muscle of the uterus (placenta accreta).  Tumors of pregnancy, including gestational trophoblastic disease. This is an abnormal pregnancy, with no fetus. The uterus is filled with many grape-like cysts that could sometimes be cancerous.  Incorrect position of the fetus (breech, vertex).  Intrauterine fetal growth retardation (IUGR) (poor growth in the womb).  Fetal  abnormalities or infection. RISKS AND COMPLICATIONS There are no known risks to the ultrasound procedure. There is no X-ray used when doing an ultrasound. Document Released: 12/18/2003 Document Revised: 03/30/2011 Document Reviewed: 12/05/2008 [facility] Patient Information 2014 [place], [place].

## 2013-05-03 NOTE — Progress Notes (Addendum)
Leslie Gamble 07-Sep-1940 [id number]   History:    73 y.o.  for her GYN exam. Patient's been complaining of right lower quadrant discomfort for the past few weeks on and off. Patient's primary care physician is Dr. Deforest Hoyles who has been doing her blood work. For her diabetes Dr. Dwyane Dee has been following her as well. Patient last bone density study 2013 demonstrated that her AP spine T. square was -0.5. Patient denies any past history of abnormal Pap smears. A she has had history of colon polyps in the past last colonoscopy 2014. Patient is overdue for mammogram her last was in 2013.  Past medical history,surgical history, family history and social history were all reviewed and documented in the EPIC chart.  Gynecologic History No LMP recorded. Patient is postmenopausal. Contraception: post menopausal status Last Pap: 2013. Results were: normal Last mammogram: 2013. Results were: normal  Obstetric History OB History  Gravida Para Term Preterm AB SAB TAB Ectopic Multiple Living  5 3 3  2     3     # Outcome Date GA Lbr Len/2nd Weight Sex Delivery Anes PTL Lv  5 ABT           4 ABT           3 TRM           2 TRM           1 TRM                ROS: A ROS was performed and pertinent positives and negatives are included in the history.  GENERAL: No fevers or chills. HEENT: No change in vision, no earache, sore throat or sinus congestion. NECK: No pain or stiffness. CARDIOVASCULAR: No chest pain or pressure. No palpitations. PULMONARY: No shortness of breath, cough or wheeze. GASTROINTESTINAL: No abdominal pain, nausea, vomiting or diarrhea, melena or bright red blood per rectum. GENITOURINARY: No urinary frequency, urgency, hesitancy or dysuria. MUSCULOSKELETAL: No joint or muscle pain, no back pain, no recent trauma. DERMATOLOGIC: No rash, no itching, no lesions. ENDOCRINE: No polyuria, polydipsia, no heat or cold intolerance. No recent change in weight. HEMATOLOGICAL: No anemia or  easy bruising or bleeding. NEUROLOGIC: No headache, seizures, numbness, tingling or weakness. PSYCHIATRIC: No depression, no loss of interest in normal activity or change in sleep pattern.     Exam: chaperone present  BP 128/84  Ht 5\' 4"  (1.626 m)  Wt 193 lb (87.544 kg)  BMI 33.11 kg/m2  Body mass index is 33.11 kg/(m^2).  General appearance : Well developed well nourished female. No acute distress HEENT: Neck supple, trachea midline, no carotid bruits, no thyroidmegaly Lungs: Clear to auscultation, no rhonchi or wheezes, or rib retractions  Heart: Regular rate and rhythm, no murmurs or gallops Breast:Examined in sitting and supine position were symmetrical in appearance, no palpable masses or tenderness,  no skin retraction, no nipple inversion, no nipple discharge, no skin discoloration, no axillary or supraclavicular lymphadenopathy Abdomen: no palpable masses or tenderness, no rebound or guarding Extremities: no edema or skin discoloration or tenderness  Pelvic:  Bartholin, Urethra, Skene Glands: Within normal limits             Vagina: No gross lesions or discharge, atrophic changes  Cervix: No gross lesions or discharge  Uterus  axial, normal size, shape and consistency, non-tender and mobile  Adnexa  Without masses or tenderness  Anus and perineum  normal   Rectovaginal  normal sphincter  tone without palpated masses or tenderness             Hemoccult PCP provide   Patient decides complaining of right lower quadrant discomfort has been feeling bloated. Since she is slightly overweight and a complete pelvic exam is difficult we will schedule ultrasound for next week.  Assessment/Plan:  73 y.o. female complaining of abdominal bloating and right lower quadrant discomfort we'll schedule an ultrasound for better assessment of her left adnexa next week. PCP will be drawing  her blood work. Pap smear not done today and according to the new guidelines. She was provided with a  requisition to schedule her mammograms. She was reminded on the importance of calcium vitamin D and regular exercise for osteoporosis prevention. Patient states all her vaccines are up-to-date.    Note: This dictation was prepared with  Dragon/digital dictation along withSmart phrase technology. Any transcriptional errors that result from this process are unintentional.   Terrance Mass MD, 3:14 PM 05/03/2013

## 2013-05-17 ENCOUNTER — Other Ambulatory Visit: Payer: Self-pay | Admitting: Gynecology

## 2013-05-17 ENCOUNTER — Ambulatory Visit (INDEPENDENT_AMBULATORY_CARE_PROVIDER_SITE_OTHER): Payer: Medicare Other

## 2013-05-17 ENCOUNTER — Encounter: Payer: Self-pay | Admitting: Gynecology

## 2013-05-17 ENCOUNTER — Ambulatory Visit (INDEPENDENT_AMBULATORY_CARE_PROVIDER_SITE_OTHER): Payer: Medicare Other | Admitting: Gynecology

## 2013-05-17 VITALS — BP 122/70

## 2013-05-17 DIAGNOSIS — R141 Gas pain: Secondary | ICD-10-CM

## 2013-05-17 DIAGNOSIS — R142 Eructation: Secondary | ICD-10-CM

## 2013-05-17 DIAGNOSIS — R102 Pelvic and perineal pain: Secondary | ICD-10-CM

## 2013-05-17 DIAGNOSIS — D251 Intramural leiomyoma of uterus: Secondary | ICD-10-CM

## 2013-05-17 DIAGNOSIS — N858 Other specified noninflammatory disorders of uterus: Secondary | ICD-10-CM

## 2013-05-17 DIAGNOSIS — D259 Leiomyoma of uterus, unspecified: Secondary | ICD-10-CM | POA: Diagnosis not present

## 2013-05-17 DIAGNOSIS — N83339 Acquired atrophy of ovary and fallopian tube, unspecified side: Secondary | ICD-10-CM | POA: Diagnosis not present

## 2013-05-17 DIAGNOSIS — N859 Noninflammatory disorder of uterus, unspecified: Secondary | ICD-10-CM | POA: Diagnosis not present

## 2013-05-17 DIAGNOSIS — R143 Flatulence: Secondary | ICD-10-CM | POA: Diagnosis not present

## 2013-05-17 DIAGNOSIS — R140 Abdominal distension (gaseous): Secondary | ICD-10-CM

## 2013-05-17 NOTE — Progress Notes (Signed)
'   Patient is a 73 year old who seen in the office on April 15 for annual gynecological examination having complaints of abdominal bloating and right lower quadrant discomfort and since she is overweight she was instructed to come today for an ultrasound for better assessment of her reproductive organs.  Ultrasound today Uterus measures 4.4 x 3.8 x 1.9 cm with endometrial stripe at 2.9 mm. Patient's right and left ovary were normal. A small uterine fibroid measuring 10 x 10 mm was noted. No apparent masses were noted on either adnexa.  Patient still has to schedule her mammogram. Her PCP strong her blood work. Patient was reassured and she will follow up with her PCP or gastroenterologist. She denies any rectal bleeding is having normal bowel movements

## 2013-06-01 DIAGNOSIS — M48061 Spinal stenosis, lumbar region without neurogenic claudication: Secondary | ICD-10-CM | POA: Diagnosis not present

## 2013-06-01 DIAGNOSIS — IMO0002 Reserved for concepts with insufficient information to code with codable children: Secondary | ICD-10-CM | POA: Diagnosis not present

## 2013-06-07 DIAGNOSIS — K219 Gastro-esophageal reflux disease without esophagitis: Secondary | ICD-10-CM | POA: Diagnosis not present

## 2013-06-07 DIAGNOSIS — M899 Disorder of bone, unspecified: Secondary | ICD-10-CM | POA: Diagnosis not present

## 2013-06-07 DIAGNOSIS — M949 Disorder of cartilage, unspecified: Secondary | ICD-10-CM | POA: Diagnosis not present

## 2013-06-07 DIAGNOSIS — G479 Sleep disorder, unspecified: Secondary | ICD-10-CM | POA: Diagnosis not present

## 2013-06-07 DIAGNOSIS — F17200 Nicotine dependence, unspecified, uncomplicated: Secondary | ICD-10-CM | POA: Diagnosis not present

## 2013-06-07 DIAGNOSIS — Z1331 Encounter for screening for depression: Secondary | ICD-10-CM | POA: Diagnosis not present

## 2013-06-07 DIAGNOSIS — IMO0001 Reserved for inherently not codable concepts without codable children: Secondary | ICD-10-CM | POA: Diagnosis not present

## 2013-06-07 DIAGNOSIS — I10 Essential (primary) hypertension: Secondary | ICD-10-CM | POA: Diagnosis not present

## 2013-06-07 DIAGNOSIS — J449 Chronic obstructive pulmonary disease, unspecified: Secondary | ICD-10-CM | POA: Diagnosis not present

## 2013-06-14 DIAGNOSIS — M899 Disorder of bone, unspecified: Secondary | ICD-10-CM | POA: Diagnosis not present

## 2013-06-14 DIAGNOSIS — M949 Disorder of cartilage, unspecified: Secondary | ICD-10-CM | POA: Diagnosis not present

## 2013-06-19 DIAGNOSIS — IMO0002 Reserved for concepts with insufficient information to code with codable children: Secondary | ICD-10-CM | POA: Diagnosis not present

## 2013-06-26 DIAGNOSIS — M545 Low back pain, unspecified: Secondary | ICD-10-CM | POA: Diagnosis not present

## 2013-06-26 DIAGNOSIS — Z0289 Encounter for other administrative examinations: Secondary | ICD-10-CM

## 2013-06-26 DIAGNOSIS — IMO0002 Reserved for concepts with insufficient information to code with codable children: Secondary | ICD-10-CM | POA: Diagnosis not present

## 2013-06-28 DIAGNOSIS — M545 Low back pain, unspecified: Secondary | ICD-10-CM | POA: Diagnosis not present

## 2013-06-28 DIAGNOSIS — IMO0002 Reserved for concepts with insufficient information to code with codable children: Secondary | ICD-10-CM | POA: Diagnosis not present

## 2013-06-29 DIAGNOSIS — F331 Major depressive disorder, recurrent, moderate: Secondary | ICD-10-CM | POA: Diagnosis not present

## 2013-07-04 DIAGNOSIS — M545 Low back pain, unspecified: Secondary | ICD-10-CM | POA: Diagnosis not present

## 2013-07-04 DIAGNOSIS — IMO0002 Reserved for concepts with insufficient information to code with codable children: Secondary | ICD-10-CM | POA: Diagnosis not present

## 2013-07-11 DIAGNOSIS — IMO0002 Reserved for concepts with insufficient information to code with codable children: Secondary | ICD-10-CM | POA: Diagnosis not present

## 2013-07-11 DIAGNOSIS — M545 Low back pain, unspecified: Secondary | ICD-10-CM | POA: Diagnosis not present

## 2013-07-12 DIAGNOSIS — R21 Rash and other nonspecific skin eruption: Secondary | ICD-10-CM | POA: Diagnosis not present

## 2013-07-13 DIAGNOSIS — M545 Low back pain, unspecified: Secondary | ICD-10-CM | POA: Diagnosis not present

## 2013-07-13 DIAGNOSIS — IMO0002 Reserved for concepts with insufficient information to code with codable children: Secondary | ICD-10-CM | POA: Diagnosis not present

## 2013-07-17 ENCOUNTER — Encounter (HOSPITAL_COMMUNITY): Payer: Self-pay | Admitting: Emergency Medicine

## 2013-07-17 ENCOUNTER — Emergency Department (HOSPITAL_COMMUNITY)
Admission: EM | Admit: 2013-07-17 | Discharge: 2013-07-17 | Disposition: A | Discharge status: Home / Self Care | Payer: Medicare Other | Attending: Emergency Medicine | Admitting: Emergency Medicine

## 2013-07-17 DIAGNOSIS — I10 Essential (primary) hypertension: Secondary | ICD-10-CM | POA: Insufficient documentation

## 2013-07-17 DIAGNOSIS — Z880 Allergy status to penicillin: Secondary | ICD-10-CM | POA: Insufficient documentation

## 2013-07-17 DIAGNOSIS — F17200 Nicotine dependence, unspecified, uncomplicated: Secondary | ICD-10-CM | POA: Diagnosis not present

## 2013-07-17 DIAGNOSIS — F3289 Other specified depressive episodes: Secondary | ICD-10-CM | POA: Insufficient documentation

## 2013-07-17 DIAGNOSIS — L299 Pruritus, unspecified: Secondary | ICD-10-CM | POA: Insufficient documentation

## 2013-07-17 DIAGNOSIS — Z79899 Other long term (current) drug therapy: Secondary | ICD-10-CM | POA: Insufficient documentation

## 2013-07-17 DIAGNOSIS — IMO0002 Reserved for concepts with insufficient information to code with codable children: Secondary | ICD-10-CM | POA: Diagnosis not present

## 2013-07-17 DIAGNOSIS — Z8719 Personal history of other diseases of the digestive system: Secondary | ICD-10-CM | POA: Diagnosis not present

## 2013-07-17 DIAGNOSIS — E119 Type 2 diabetes mellitus without complications: Secondary | ICD-10-CM | POA: Insufficient documentation

## 2013-07-17 DIAGNOSIS — Z791 Long term (current) use of non-steroidal anti-inflammatories (NSAID): Secondary | ICD-10-CM | POA: Diagnosis not present

## 2013-07-17 DIAGNOSIS — Z87828 Personal history of other (healed) physical injury and trauma: Secondary | ICD-10-CM | POA: Insufficient documentation

## 2013-07-17 DIAGNOSIS — F329 Major depressive disorder, single episode, unspecified: Secondary | ICD-10-CM | POA: Insufficient documentation

## 2013-07-17 DIAGNOSIS — J45909 Unspecified asthma, uncomplicated: Secondary | ICD-10-CM | POA: Diagnosis not present

## 2013-07-17 MED ORDER — DIPHENHYDRAMINE HCL 25 MG PO CAPS
25.0000 mg | ORAL_CAPSULE | Freq: Once | ORAL | Status: AC
  Administered 2013-07-17: 25 mg via ORAL
  Filled 2013-07-17: qty 1

## 2013-07-17 MED ORDER — DIPHENHYDRAMINE HCL 25 MG PO CAPS: 25.0000 mg | ORAL_CAPSULE | Freq: Four times a day (QID) | ORAL | Status: DC | PRN

## 2013-07-17 NOTE — ED Notes (Signed)
Pt. reports persistent left foot itching for 5 days unrelieved by prescription foot cream prescribed by PCP.

## 2013-07-17 NOTE — Discharge Instructions (Signed)
Pruritus  Pruritis is an itch. There are many different problems that can cause an itch. Dry skin is one of the most common causes of itching. Most cases of itching do not require medical attention.  HOME CARE INSTRUCTIONS  Make sure your skin is moistened on a regular basis. A moisturizer that contains petroleum jelly is best for keeping moisture in your skin. If you develop a rash, you may try the following for relief:   Use corticosteroid cream.  Apply cool compresses to the affected areas.  Bathe with Epsom salts or baking soda in the bathwater.  Soak in colloidal oatmeal baths. These are available at your pharmacy.  Apply baking soda paste to the rash. Stir water into baking soda until it reaches a paste-like consistency.  Use an anti-itch lotion.  Take over-the-counter diphenhydramine medicine by mouth as the instructions direct.  Avoid scratching. Scratching may cause the rash to become infected. If itching is very bad, your caregiver may suggest prescription lotions or creams to lessen your symptoms.  Avoid hot showers, which can make itching worse. A cold shower may help with itching as long as you use a moisturizer after the shower. SEEK MEDICAL CARE IF: The itching does not go away after several days. Document Released: 09/17/2010 Document Revised: 03/30/2011 Document Reviewed: 09/17/2010 [facility] Patient Information 2015 [place], [place]. This information is not intended to replace advice given to you by your health care provider. Make sure you discuss any questions you have with your health care provider.
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
## 2013-07-18 DIAGNOSIS — M545 Low back pain, unspecified: Secondary | ICD-10-CM | POA: Diagnosis not present

## 2013-07-18 DIAGNOSIS — IMO0002 Reserved for concepts with insufficient information to code with codable children: Secondary | ICD-10-CM | POA: Diagnosis not present

## 2013-07-20 DIAGNOSIS — M545 Low back pain, unspecified: Secondary | ICD-10-CM | POA: Diagnosis not present

## 2013-07-20 DIAGNOSIS — IMO0002 Reserved for concepts with insufficient information to code with codable children: Secondary | ICD-10-CM | POA: Diagnosis not present

## 2013-07-25 DIAGNOSIS — IMO0002 Reserved for concepts with insufficient information to code with codable children: Secondary | ICD-10-CM | POA: Diagnosis not present

## 2013-07-25 DIAGNOSIS — M545 Low back pain, unspecified: Secondary | ICD-10-CM | POA: Diagnosis not present

## 2013-07-27 DIAGNOSIS — IMO0002 Reserved for concepts with insufficient information to code with codable children: Secondary | ICD-10-CM | POA: Diagnosis not present

## 2013-07-27 DIAGNOSIS — M545 Low back pain, unspecified: Secondary | ICD-10-CM | POA: Diagnosis not present

## 2013-08-01 DIAGNOSIS — IMO0002 Reserved for concepts with insufficient information to code with codable children: Secondary | ICD-10-CM | POA: Diagnosis not present

## 2013-08-01 DIAGNOSIS — M545 Low back pain, unspecified: Secondary | ICD-10-CM | POA: Diagnosis not present

## 2013-08-03 DIAGNOSIS — IMO0002 Reserved for concepts with insufficient information to code with codable children: Secondary | ICD-10-CM | POA: Diagnosis not present

## 2013-08-03 DIAGNOSIS — M545 Low back pain, unspecified: Secondary | ICD-10-CM | POA: Diagnosis not present

## 2013-08-08 DIAGNOSIS — M48061 Spinal stenosis, lumbar region without neurogenic claudication: Secondary | ICD-10-CM | POA: Diagnosis not present

## 2013-08-08 DIAGNOSIS — IMO0002 Reserved for concepts with insufficient information to code with codable children: Secondary | ICD-10-CM | POA: Diagnosis not present

## 2013-08-10 DIAGNOSIS — M545 Low back pain, unspecified: Secondary | ICD-10-CM | POA: Diagnosis not present

## 2013-08-10 DIAGNOSIS — IMO0002 Reserved for concepts with insufficient information to code with codable children: Secondary | ICD-10-CM | POA: Diagnosis not present

## 2013-08-17 DIAGNOSIS — M545 Low back pain, unspecified: Secondary | ICD-10-CM | POA: Diagnosis not present

## 2013-08-17 DIAGNOSIS — IMO0002 Reserved for concepts with insufficient information to code with codable children: Secondary | ICD-10-CM | POA: Diagnosis not present

## 2013-08-22 DIAGNOSIS — M545 Low back pain, unspecified: Secondary | ICD-10-CM | POA: Diagnosis not present

## 2013-08-22 DIAGNOSIS — IMO0002 Reserved for concepts with insufficient information to code with codable children: Secondary | ICD-10-CM | POA: Diagnosis not present

## 2013-08-24 DIAGNOSIS — M545 Low back pain, unspecified: Secondary | ICD-10-CM | POA: Diagnosis not present

## 2013-08-24 DIAGNOSIS — IMO0002 Reserved for concepts with insufficient information to code with codable children: Secondary | ICD-10-CM | POA: Diagnosis not present

## 2013-08-29 DIAGNOSIS — IMO0002 Reserved for concepts with insufficient information to code with codable children: Secondary | ICD-10-CM | POA: Diagnosis not present

## 2013-08-29 DIAGNOSIS — M545 Low back pain, unspecified: Secondary | ICD-10-CM | POA: Diagnosis not present

## 2013-08-31 DIAGNOSIS — M545 Low back pain, unspecified: Secondary | ICD-10-CM | POA: Diagnosis not present

## 2013-08-31 DIAGNOSIS — IMO0002 Reserved for concepts with insufficient information to code with codable children: Secondary | ICD-10-CM | POA: Diagnosis not present

## 2013-09-04 ENCOUNTER — Other Ambulatory Visit: Payer: Self-pay | Admitting: Endocrinology

## 2013-09-06 DIAGNOSIS — IMO0002 Reserved for concepts with insufficient information to code with codable children: Secondary | ICD-10-CM | POA: Diagnosis not present

## 2013-09-06 DIAGNOSIS — M48061 Spinal stenosis, lumbar region without neurogenic claudication: Secondary | ICD-10-CM | POA: Diagnosis not present

## 2013-09-07 ENCOUNTER — Other Ambulatory Visit: Payer: Self-pay | Admitting: Endocrinology

## 2013-09-07 NOTE — Telephone Encounter (Signed)
Patient is out of test strips, one touch verio could you refax to pharmacy. Thank you.

## 2013-09-08 ENCOUNTER — Other Ambulatory Visit: Payer: Self-pay | Admitting: *Deleted

## 2013-09-08 ENCOUNTER — Telehealth: Payer: Self-pay | Admitting: Endocrinology

## 2013-09-08 MED ORDER — GLUCOSE BLOOD VI STRP: ORAL_STRIP | Status: DC

## 2013-09-08 NOTE — Telephone Encounter (Signed)
Patient ask if you can resend fax for One Touch Verio test strips, pharmacy said they didn't get it.Marland Kitchen

## 2013-09-08 NOTE — Telephone Encounter (Signed)
rx sent again

## 2013-09-27 DIAGNOSIS — R300 Dysuria: Secondary | ICD-10-CM | POA: Diagnosis not present

## 2013-09-29 ENCOUNTER — Encounter: Payer: Self-pay | Admitting: Women's Health

## 2013-09-29 ENCOUNTER — Ambulatory Visit (INDEPENDENT_AMBULATORY_CARE_PROVIDER_SITE_OTHER): Payer: Medicare Other | Admitting: Women's Health

## 2013-09-29 DIAGNOSIS — N898 Other specified noninflammatory disorders of vagina: Secondary | ICD-10-CM

## 2013-09-29 DIAGNOSIS — N390 Urinary tract infection, site not specified: Secondary | ICD-10-CM | POA: Diagnosis not present

## 2013-09-29 LAB — URINALYSIS W MICROSCOPIC + REFLEX CULTURE
Bilirubin Urine: NEGATIVE
Casts: NONE SEEN
Crystals: NONE SEEN
GLUCOSE, UA: NEGATIVE mg/dL
HGB URINE DIPSTICK: NEGATIVE
Ketones, ur: NEGATIVE mg/dL
Nitrite: NEGATIVE
PROTEIN: 30 mg/dL — AB
RBC / HPF: NONE SEEN RBC/hpf (ref <3)
Specific Gravity, Urine: 1.015 (ref 1.005–1.030)
UROBILINOGEN UA: 0.2 mg/dL (ref 0.0–1.0)
pH: 5.5 (ref 5.0–8.0)

## 2013-09-29 LAB — WET PREP FOR TRICH, YEAST, CLUE
CLUE CELLS WET PREP: NONE SEEN
TRICH WET PREP: NONE SEEN
Yeast Wet Prep HPF POC: NONE SEEN

## 2013-09-29 MED ORDER — NYSTATIN 100000 UNIT/GM EX CREA: TOPICAL_CREAM | Freq: Two times a day (BID) | CUTANEOUS | Status: AC
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
## 2013-09-30 LAB — URINE CULTURE

## 2013-10-03 DIAGNOSIS — M48061 Spinal stenosis, lumbar region without neurogenic claudication: Secondary | ICD-10-CM | POA: Diagnosis not present

## 2013-10-03 DIAGNOSIS — M545 Low back pain, unspecified: Secondary | ICD-10-CM | POA: Diagnosis not present

## 2013-10-03 DIAGNOSIS — IMO0002 Reserved for concepts with insufficient information to code with codable children: Secondary | ICD-10-CM | POA: Diagnosis not present

## 2013-10-11 DIAGNOSIS — E119 Type 2 diabetes mellitus without complications: Secondary | ICD-10-CM | POA: Diagnosis not present

## 2013-10-11 DIAGNOSIS — F329 Major depressive disorder, single episode, unspecified: Secondary | ICD-10-CM | POA: Diagnosis not present

## 2013-10-11 DIAGNOSIS — I10 Essential (primary) hypertension: Secondary | ICD-10-CM | POA: Diagnosis not present

## 2013-10-11 DIAGNOSIS — F17200 Nicotine dependence, unspecified, uncomplicated: Secondary | ICD-10-CM | POA: Diagnosis not present

## 2013-10-11 DIAGNOSIS — K219 Gastro-esophageal reflux disease without esophagitis: Secondary | ICD-10-CM | POA: Diagnosis not present

## 2013-10-11 DIAGNOSIS — Z23 Encounter for immunization: Secondary | ICD-10-CM | POA: Diagnosis not present

## 2013-10-18 DIAGNOSIS — F331 Major depressive disorder, recurrent, moderate: Secondary | ICD-10-CM | POA: Diagnosis not present

## 2013-10-27 DIAGNOSIS — E11329 Type 2 diabetes mellitus with mild nonproliferative diabetic retinopathy without macular edema: Secondary | ICD-10-CM | POA: Diagnosis not present

## 2013-10-27 DIAGNOSIS — H3500 Unspecified background retinopathy: Secondary | ICD-10-CM | POA: Diagnosis not present

## 2013-10-27 DIAGNOSIS — H02831 Dermatochalasis of right upper eyelid: Secondary | ICD-10-CM | POA: Diagnosis not present

## 2013-10-27 DIAGNOSIS — H2513 Age-related nuclear cataract, bilateral: Secondary | ICD-10-CM | POA: Diagnosis not present

## 2013-11-08 DIAGNOSIS — M1711 Unilateral primary osteoarthritis, right knee: Secondary | ICD-10-CM | POA: Diagnosis not present

## 2013-11-08 DIAGNOSIS — M25561 Pain in right knee: Secondary | ICD-10-CM | POA: Diagnosis not present

## 2013-11-20 ENCOUNTER — Encounter: Payer: Self-pay | Admitting: Women's Health

## 2013-12-19 DIAGNOSIS — M545 Low back pain: Secondary | ICD-10-CM | POA: Diagnosis not present

## 2013-12-19 DIAGNOSIS — M47817 Spondylosis without myelopathy or radiculopathy, lumbosacral region: Secondary | ICD-10-CM | POA: Diagnosis not present

## 2013-12-20 ENCOUNTER — Other Ambulatory Visit: Payer: Self-pay

## 2013-12-20 DIAGNOSIS — Z1231 Encounter for screening mammogram for malignant neoplasm of breast: Secondary | ICD-10-CM

## 2013-12-25 DIAGNOSIS — H43811 Vitreous degeneration, right eye: Secondary | ICD-10-CM | POA: Diagnosis not present

## 2014-01-01 DIAGNOSIS — M25561 Pain in right knee: Secondary | ICD-10-CM | POA: Diagnosis not present

## 2014-01-03 ENCOUNTER — Ambulatory Visit: Payer: Medicare Other

## 2014-01-24 DIAGNOSIS — H43811 Vitreous degeneration, right eye: Secondary | ICD-10-CM | POA: Diagnosis not present

## 2014-01-24 DIAGNOSIS — H3500 Unspecified background retinopathy: Secondary | ICD-10-CM | POA: Diagnosis not present

## 2014-01-26 DIAGNOSIS — F331 Major depressive disorder, recurrent, moderate: Secondary | ICD-10-CM | POA: Diagnosis not present

## 2014-02-19 DIAGNOSIS — K219 Gastro-esophageal reflux disease without esophagitis: Secondary | ICD-10-CM | POA: Diagnosis not present

## 2014-02-19 DIAGNOSIS — F324 Major depressive disorder, single episode, in partial remission: Secondary | ICD-10-CM | POA: Diagnosis not present

## 2014-02-19 DIAGNOSIS — I10 Essential (primary) hypertension: Secondary | ICD-10-CM | POA: Diagnosis not present

## 2014-02-19 DIAGNOSIS — J449 Chronic obstructive pulmonary disease, unspecified: Secondary | ICD-10-CM | POA: Diagnosis not present

## 2014-02-19 DIAGNOSIS — E119 Type 2 diabetes mellitus without complications: Secondary | ICD-10-CM | POA: Diagnosis not present

## 2014-02-19 DIAGNOSIS — F17210 Nicotine dependence, cigarettes, uncomplicated: Secondary | ICD-10-CM | POA: Diagnosis not present

## 2014-02-19 DIAGNOSIS — R05 Cough: Secondary | ICD-10-CM | POA: Diagnosis not present

## 2014-02-22 ENCOUNTER — Emergency Department (HOSPITAL_COMMUNITY)
Admission: EM | Admit: 2014-02-22 | Discharge: 2014-02-22 | Disposition: A | Discharge status: Home / Self Care | Payer: Medicare Other | Attending: Emergency Medicine | Admitting: Emergency Medicine

## 2014-02-22 ENCOUNTER — Encounter (HOSPITAL_COMMUNITY): Payer: Self-pay | Admitting: Emergency Medicine

## 2014-02-22 ENCOUNTER — Emergency Department (HOSPITAL_COMMUNITY): Payer: Medicare Other

## 2014-02-22 DIAGNOSIS — M25561 Pain in right knee: Secondary | ICD-10-CM | POA: Diagnosis not present

## 2014-02-22 DIAGNOSIS — J45909 Unspecified asthma, uncomplicated: Secondary | ICD-10-CM | POA: Insufficient documentation

## 2014-02-22 DIAGNOSIS — S3992XA Unspecified injury of lower back, initial encounter: Secondary | ICD-10-CM | POA: Diagnosis not present

## 2014-02-22 DIAGNOSIS — Z79899 Other long term (current) drug therapy: Secondary | ICD-10-CM | POA: Diagnosis not present

## 2014-02-22 DIAGNOSIS — Y92410 Unspecified street and highway as the place of occurrence of the external cause: Secondary | ICD-10-CM | POA: Diagnosis not present

## 2014-02-22 DIAGNOSIS — Z7951 Long term (current) use of inhaled steroids: Secondary | ICD-10-CM | POA: Insufficient documentation

## 2014-02-22 DIAGNOSIS — M25559 Pain in unspecified hip: Secondary | ICD-10-CM

## 2014-02-22 DIAGNOSIS — E119 Type 2 diabetes mellitus without complications: Secondary | ICD-10-CM | POA: Insufficient documentation

## 2014-02-22 DIAGNOSIS — Y9389 Activity, other specified: Secondary | ICD-10-CM | POA: Diagnosis not present

## 2014-02-22 DIAGNOSIS — S79911A Unspecified injury of right hip, initial encounter: Secondary | ICD-10-CM | POA: Insufficient documentation

## 2014-02-22 DIAGNOSIS — F329 Major depressive disorder, single episode, unspecified: Secondary | ICD-10-CM | POA: Diagnosis not present

## 2014-02-22 DIAGNOSIS — M25551 Pain in right hip: Secondary | ICD-10-CM | POA: Diagnosis not present

## 2014-02-22 DIAGNOSIS — Z720 Tobacco use: Secondary | ICD-10-CM | POA: Insufficient documentation

## 2014-02-22 DIAGNOSIS — Y998 Other external cause status: Secondary | ICD-10-CM | POA: Insufficient documentation

## 2014-02-22 DIAGNOSIS — S199XXA Unspecified injury of neck, initial encounter: Secondary | ICD-10-CM | POA: Insufficient documentation

## 2014-02-22 DIAGNOSIS — M5441 Lumbago with sciatica, right side: Secondary | ICD-10-CM | POA: Diagnosis not present

## 2014-02-22 DIAGNOSIS — S8991XA Unspecified injury of right lower leg, initial encounter: Secondary | ICD-10-CM | POA: Diagnosis not present

## 2014-02-22 DIAGNOSIS — M545 Low back pain: Secondary | ICD-10-CM | POA: Diagnosis not present

## 2014-02-22 DIAGNOSIS — Z880 Allergy status to penicillin: Secondary | ICD-10-CM | POA: Insufficient documentation

## 2014-02-22 DIAGNOSIS — M1990 Unspecified osteoarthritis, unspecified site: Secondary | ICD-10-CM | POA: Insufficient documentation

## 2014-02-22 DIAGNOSIS — I10 Essential (primary) hypertension: Secondary | ICD-10-CM | POA: Diagnosis not present

## 2014-02-22 MED ORDER — ACETAMINOPHEN 500 MG PO TABS
1000.0000 mg | ORAL_TABLET | Freq: Once | ORAL | Status: AC
  Administered 2014-02-22: 1000 mg via ORAL
  Filled 2014-02-22: qty 2

## 2014-02-22 MED ORDER — MELOXICAM 7.5 MG PO TABS: 15.0000 mg | ORAL_TABLET | Freq: Every day | ORAL | Status: DC

## 2014-02-22 MED ORDER — METHOCARBAMOL 500 MG PO TABS: 500.0000 mg | ORAL_TABLET | Freq: Two times a day (BID) | ORAL | Status: DC | PRN
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
## 2014-02-22 NOTE — Discharge Instructions (Signed)
1. Medications: robaxin, mobic, usual home medications 2. Treatment: rest, drink plenty of fluids, gentle stretching as discussed, alternate ice and heat 3. Follow Up: Please followup with your primary doctor in 3 days for discussion of your diagnoses and further evaluation after today's visit; if you do not have a primary care doctor use the resource guide provided to find one;  Return to the ER for worsening back pain, difficulty walking, loss of bowel or bladder control or other concerning symptoms    Sciatica Sciatica is pain, weakness, numbness, or tingling along the path of the sciatic nerve. The nerve starts in the lower back and runs down the back of each leg. The nerve controls the muscles in the lower leg and in the back of the knee, while also providing sensation to the back of the thigh, lower leg, and the sole of your foot. Sciatica is a symptom of another medical condition. For instance, nerve damage or certain conditions, such as a herniated disk or bone spur on the spine, pinch or put pressure on the sciatic nerve. This causes the pain, weakness, or other sensations normally associated with sciatica. Generally, sciatica only affects one side of the body. CAUSES   Herniated or slipped disc.  Degenerative disk disease.  A pain disorder involving the narrow muscle in the buttocks (piriformis syndrome).  Pelvic injury or fracture.  Pregnancy.  Tumor (rare). SYMPTOMS  Symptoms can vary from mild to very severe. The symptoms usually travel from the low back to the buttocks and down the back of the leg. Symptoms can include:  Mild tingling or dull aches in the lower back, leg, or hip.  Numbness in the back of the calf or sole of the foot.  Burning sensations in the lower back, leg, or hip.  Sharp pains in the lower back, leg, or hip.  Leg weakness.  Severe back pain inhibiting movement. These symptoms may get worse with coughing, sneezing, laughing, or prolonged sitting or  standing. Also, being overweight may worsen symptoms. DIAGNOSIS  Your caregiver will perform a physical exam to look for common symptoms of sciatica. He or she may ask you to do certain movements or activities that would trigger sciatic nerve pain. Other tests may be performed to find the cause of the sciatica. These may include:  Blood tests.  X-rays.  Imaging tests, such as an MRI or CT scan. TREATMENT  Treatment is directed at the cause of the sciatic pain. Sometimes, treatment is not necessary and the pain and discomfort goes away on its own. If treatment is needed, your caregiver may suggest:  Over-the-counter medicines to relieve pain.  Prescription medicines, such as anti-inflammatory medicine, muscle relaxants, or narcotics.  Applying heat or ice to the painful area.  Steroid injections to lessen pain, irritation, and inflammation around the nerve.  Reducing activity during periods of pain.  Exercising and stretching to strengthen your abdomen and improve flexibility of your spine. Your caregiver may suggest losing weight if the extra weight makes the back pain worse.  Physical therapy.  Surgery to eliminate what is pressing or pinching the nerve, such as a bone spur or part of a herniated disk. HOME CARE INSTRUCTIONS   Only take over-the-counter or prescription medicines for pain or discomfort as directed by your caregiver.  Apply ice to the affected area for 20 minutes, 3-4 times a day for the first 48-72 hours. Then try heat in the same way.  Exercise, stretch, or perform your usual activities if these  do not aggravate your pain.  Attend physical therapy sessions as directed by your caregiver.  Keep all follow-up appointments as directed by your caregiver.  Do not wear high heels or shoes that do not provide proper support.  Check your mattress to see if it is too soft. A firm mattress may lessen your pain and discomfort. SEEK IMMEDIATE MEDICAL CARE IF:   You  lose control of your bowel or bladder (incontinence).  You have increasing weakness in the lower back, pelvis, buttocks, or legs.  You have redness or swelling of your back.  You have a burning sensation when you urinate.  You have pain that gets worse when you lie down or awakens you at night.  Your pain is worse than you have experienced in the past.  Your pain is lasting longer than 4 weeks.  You are suddenly losing weight without reason. MAKE SURE YOU:  Understand these instructions.  Will watch your condition.  Will get help right away if you are not doing well or get worse. Document Released: 12/30/2000 Document Revised: 07/07/2011 Document Reviewed: 05/17/2011 [facility] Patient Information 2015 [place], [place]. This information is not intended to replace advice given to you by your health care provider. Make sure you discuss any questions you have with your health care provider.
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
## 2014-02-28 ENCOUNTER — Ambulatory Visit
Admission: RE | Admit: 2014-02-28 | Discharge: 2014-02-28 | Disposition: A | Discharge status: Home / Self Care | Payer: Medicare Other | Source: Ambulatory Visit

## 2014-02-28 DIAGNOSIS — Z1231 Encounter for screening mammogram for malignant neoplasm of breast: Secondary | ICD-10-CM

## 2014-03-08 DIAGNOSIS — M549 Dorsalgia, unspecified: Secondary | ICD-10-CM | POA: Diagnosis not present

## 2014-03-08 DIAGNOSIS — R0981 Nasal congestion: Secondary | ICD-10-CM | POA: Diagnosis not present

## 2014-03-08 DIAGNOSIS — M25569 Pain in unspecified knee: Secondary | ICD-10-CM | POA: Diagnosis not present

## 2014-03-14 DIAGNOSIS — J45901 Unspecified asthma with (acute) exacerbation: Secondary | ICD-10-CM | POA: Diagnosis not present

## 2014-03-14 DIAGNOSIS — J019 Acute sinusitis, unspecified: Secondary | ICD-10-CM | POA: Diagnosis not present

## 2014-03-23 DIAGNOSIS — J441 Chronic obstructive pulmonary disease with (acute) exacerbation: Secondary | ICD-10-CM | POA: Diagnosis not present

## 2014-03-23 DIAGNOSIS — E119 Type 2 diabetes mellitus without complications: Secondary | ICD-10-CM | POA: Diagnosis not present

## 2014-03-26 DIAGNOSIS — R05 Cough: Secondary | ICD-10-CM | POA: Diagnosis not present

## 2014-03-26 DIAGNOSIS — J449 Chronic obstructive pulmonary disease, unspecified: Secondary | ICD-10-CM | POA: Diagnosis not present

## 2014-04-05 DIAGNOSIS — M1711 Unilateral primary osteoarthritis, right knee: Secondary | ICD-10-CM | POA: Diagnosis not present

## 2014-04-29 DIAGNOSIS — B370 Candidal stomatitis: Secondary | ICD-10-CM | POA: Diagnosis not present

## 2014-05-15 DIAGNOSIS — N390 Urinary tract infection, site not specified: Secondary | ICD-10-CM | POA: Diagnosis not present

## 2014-05-31 ENCOUNTER — Ambulatory Visit (INDEPENDENT_AMBULATORY_CARE_PROVIDER_SITE_OTHER): Payer: Medicare Other | Admitting: Gynecology

## 2014-05-31 ENCOUNTER — Encounter: Payer: Self-pay | Admitting: Gynecology

## 2014-05-31 VITALS — BP 128/70

## 2014-05-31 DIAGNOSIS — L293 Anogenital pruritus, unspecified: Secondary | ICD-10-CM | POA: Diagnosis not present

## 2014-05-31 DIAGNOSIS — N898 Other specified noninflammatory disorders of vagina: Secondary | ICD-10-CM

## 2014-05-31 DIAGNOSIS — B373 Candidiasis of vulva and vagina: Secondary | ICD-10-CM | POA: Diagnosis not present

## 2014-05-31 DIAGNOSIS — N3000 Acute cystitis without hematuria: Secondary | ICD-10-CM | POA: Diagnosis not present

## 2014-05-31 DIAGNOSIS — B3731 Acute candidiasis of vulva and vagina: Secondary | ICD-10-CM

## 2014-05-31 DIAGNOSIS — L298 Other pruritus: Secondary | ICD-10-CM | POA: Diagnosis not present

## 2014-05-31 DIAGNOSIS — R350 Frequency of micturition: Secondary | ICD-10-CM | POA: Diagnosis not present

## 2014-05-31 LAB — URINALYSIS W MICROSCOPIC + REFLEX CULTURE
BILIRUBIN URINE: NEGATIVE
CRYSTALS: NONE SEEN
Casts: NONE SEEN
Hgb urine dipstick: NEGATIVE
Ketones, ur: NEGATIVE mg/dL
Leukocytes, UA: NEGATIVE
Nitrite: NEGATIVE
PH: 5.5 (ref 5.0–8.0)
PROTEIN: 100 mg/dL — AB
RBC / HPF: NONE SEEN RBC/hpf (ref <3)
Specific Gravity, Urine: 1.02 (ref 1.005–1.030)
Urobilinogen, UA: 0.2 mg/dL (ref 0.0–1.0)

## 2014-05-31 LAB — WET PREP FOR TRICH, YEAST, CLUE
CLUE CELLS WET PREP: NONE SEEN
Trich, Wet Prep: NONE SEEN

## 2014-05-31 MED ORDER — TRIMETHOPRIM 100 MG PO TABS: 100.0000 mg | ORAL_TABLET | Freq: Two times a day (BID) | ORAL | Status: DC

## 2014-05-31 NOTE — Patient Instructions (Signed)
Urinary Tract Infection Urinary tract infections (UTIs) can develop anywhere along your urinary tract. Your urinary tract is your body's drainage system for removing wastes and extra water. Your urinary tract includes two kidneys, two ureters, a bladder, and a urethra. Your kidneys are a pair of bean-shaped organs. Each kidney is about the size of your fist. They are located below your ribs, one on each side of your spine. CAUSES Infections are caused by microbes, which are microscopic organisms, including fungi, viruses, and bacteria. These organisms are so small that they can only be seen through a microscope. Bacteria are the microbes that most commonly cause UTIs. SYMPTOMS  Symptoms of UTIs may vary by age and gender of the patient and by the location of the infection. Symptoms in young women typically include a frequent and intense urge to urinate and a painful, burning feeling in the bladder or urethra during urination. Older women and men are more likely to be tired, shaky, and weak and have muscle aches and abdominal pain. A fever may mean the infection is in your kidneys. Other symptoms of a kidney infection include pain in your back or sides below the ribs, nausea, and vomiting. DIAGNOSIS To diagnose a UTI, your caregiver will ask you about your symptoms. Your caregiver also will ask to provide a urine sample. The urine sample will be tested for bacteria and white blood cells. White blood cells are made by your body to help fight infection. TREATMENT  Typically, UTIs can be treated with medication. Because most UTIs are caused by a bacterial infection, they usually can be treated with the use of antibiotics. The choice of antibiotic and length of treatment depend on your symptoms and the type of bacteria causing your infection. HOME CARE INSTRUCTIONS  If you were prescribed antibiotics, take them exactly as your caregiver instructs you. Finish the medication even if you feel better after you  have only taken some of the medication.  Drink enough water and fluids to keep your urine clear or pale yellow.  Avoid caffeine, tea, and carbonated beverages. They tend to irritate your bladder.  Empty your bladder often. Avoid holding urine for long periods of time.  Empty your bladder before and after sexual intercourse.  After a bowel movement, women should cleanse from front to back. Use each tissue only once. SEEK MEDICAL CARE IF:   You have back pain.  You develop a fever.  Your symptoms do not begin to resolve within 3 days. SEEK IMMEDIATE MEDICAL CARE IF:   You have severe back pain or lower abdominal pain.  You develop chills.  You have nausea or vomiting.  You have continued burning or discomfort with urination. MAKE SURE YOU:   Understand these instructions.  Will watch your condition.  Will get help right away if you are not doing well or get worse. Document Released: 10/15/2004 Document Revised: 07/07/2011 Document Reviewed: 02/13/2011 ExitCare Patient Information 2015 [employer]. This information is not intended to replace advice given to you by your health care provider. Make sure you discuss any questions you have with your health care provider. Monilial Vaginitis Vaginitis in a soreness, swelling and redness (inflammation) of the vagina and vulva. Monilial vaginitis is not a sexually transmitted infection. CAUSES  Yeast vaginitis is caused by yeast (candida) that is normally found in your vagina. With a yeast infection, the candida has overgrown in number to a point that upsets the chemical balance. SYMPTOMS   White, thick vaginal discharge.  Swelling, itching,   redness and irritation of the vagina and possibly the lips of the vagina (vulva).  Burning or painful urination.  Painful intercourse. DIAGNOSIS  Things that may contribute to monilial vaginitis are:  Postmenopausal and virginal states.  Pregnancy.  Infections.  Being tired, sick  or stressed, especially if you had monilial vaginitis in the past.  Diabetes. Good control will help lower the chance.  Birth control pills.  Tight fitting garments.  Using bubble bath, feminine sprays, douches or deodorant tampons.  Taking certain medications that kill germs (antibiotics).  Sporadic recurrence can occur if you become ill. TREATMENT  Your caregiver will give you medication.  There are several kinds of anti monilial vaginal creams and suppositories specific for monilial vaginitis. For recurrent yeast infections, use a suppository or cream in the vagina 2 times a week, or as directed.  Anti-monilial or steroid cream for the itching or irritation of the vulva may also be used. Get your caregiver's permission.  Painting the vagina with methylene blue solution may help if the monilial cream does not work.  Eating yogurt may help prevent monilial vaginitis. HOME CARE INSTRUCTIONS   Finish all medication as prescribed.  Do not have sex until treatment is completed or after your caregiver tells you it is okay.  Take warm sitz baths.  Do not douche.  Do not use tampons, especially scented ones.  Wear cotton underwear.  Avoid tight pants and panty hose.  Tell your sexual partner that you have a yeast infection. They should go to their caregiver if they have symptoms such as mild rash or itching.  Your sexual partner should be treated as well if your infection is difficult to eliminate.  Practice safer sex. Use condoms.  Some vaginal medications cause latex condoms to fail. Vaginal medications that harm condoms are:  Cleocin cream.  Butoconazole (Femstat).  Terconazole (Terazol) vaginal suppository.  Miconazole (Monistat) (may be purchased over the counter). SEEK MEDICAL CARE IF:   You have a temperature by mouth above 102 F (38.9 C).  The infection is getting worse after 2 days of treatment.  The infection is not getting better after 3 days of  treatment.  You develop blisters in or around your vagina.  You develop vaginal bleeding, and it is not your menstrual period.  You have pain when you urinate.  You develop intestinal problems.  You have pain with sexual intercourse. Document Released: 10/15/2004 Document Revised: 03/30/2011 Document Reviewed: 06/29/2008 ExitCare Patient Information 2015 [employer]. This information is not intended to replace advice given to you by your health care provider. Make sure you discuss any questions you have with your health care provider.  

## 2014-05-31 NOTE — Progress Notes (Signed)
   Patient is a 74 year old who presented to the office complaining of frequency in urination and slight dysuria for the past few days. Also she was having some vulvar pruritus slight discharge. Patient not sexually active. Patient denied any fever, chills, nausea, vomiting minimal back discomfort.  Exam: Blood pressure 128/70 Abdomen: Soft nontender no rebound or guarding Pelvic: Bartholin urethra Skene was within normal limits Vagina no gross lesions on inspection Cervix: No lesions or discharge Bimanual exam: Uterus anteverted normal size shape and consistency nontender Adnexa: No palpable masses or tenderness Rectal exam not done  Wet prep: Rare yeast, few bacteria, few WBC  Urinalysis yeast was noted along with few bacteria culture submitted  Assessment/plan: #1 yeast vaginitis patient will use 1 day Monistat #2 based on symptoms and findings likely patient has urinary tract infections that she has multitude of allergies she'll be placed on trimethoprim 100 mg twice a day for 10 days.

## 2014-06-02 LAB — URINE CULTURE

## 2014-06-06 ENCOUNTER — Ambulatory Visit (INDEPENDENT_AMBULATORY_CARE_PROVIDER_SITE_OTHER): Payer: Medicare Other | Admitting: Podiatry

## 2014-06-06 ENCOUNTER — Ambulatory Visit (INDEPENDENT_AMBULATORY_CARE_PROVIDER_SITE_OTHER): Payer: Medicare Other

## 2014-06-06 ENCOUNTER — Encounter: Payer: Self-pay | Admitting: Podiatry

## 2014-06-06 VITALS — BP 131/73 | HR 79 | Resp 15

## 2014-06-06 DIAGNOSIS — L600 Ingrowing nail: Secondary | ICD-10-CM

## 2014-06-06 DIAGNOSIS — M2010 Hallux valgus (acquired), unspecified foot: Secondary | ICD-10-CM

## 2014-06-06 NOTE — Progress Notes (Signed)
   Subjective:    Patient ID: Leslie Gamble, female    DOB: 09/17/1940, 74 y.o.   MRN: [id number]  HPI Pt presents with bilateral bunions that are painful, worse in right foot, she also c/o right great nail pain and wants her nails to be trimmed   Review of Systems  All other systems reviewed and are negative.      Objective:   Physical Exam        Assessment & Plan:

## 2014-06-07 NOTE — Progress Notes (Signed)
Subjective:     Patient ID: Leslie Gamble, female   DOB: 1941/01/18, 74 y.o.   MRN: [id number]  HPI patient presents with structural bunion deformity right over left with redness around the first metatarsal head and also has some discomfort in the end of the right big toe right and is not sure if there is no other toe itself with a history of back problems   Review of Systems  All other systems reviewed and are negative.      Objective:   Physical Exam  Constitutional: She is oriented to person, place, and time.  Cardiovascular: Intact distal pulses.   Musculoskeletal: Normal range of motion.  Neurological: She is oriented to person, place, and time.  Skin: Skin is warm and dry.  Nursing note and vitals reviewed.  neurovascular status intact muscle strength adequate with range of motion subtalar midtarsal joint found to be within normal limits. Patient's noted to have moderate depression of the arch and has hyperostosis medial aspect first metatarsal head right over left with redness and pain of a mild to moderate nature right over left. Patient has discomfort in the distal end of the right big toe but I did not see any deformity of the nail thickness or corn callus formation. Patient has good digital perfusion is well oriented 3     Assessment:     Structural HAV deformity right over left with moderate symptoms and possibility for issue from the back creating irritation of the big toe right or some kind of irritation of the toe itself    Plan:     H&P and x-rays reviewed. Today I have recommended consideration for structural bunion correction and I reviewed this with patient but at this time we'll hold off and treat conservatively. Also I applied padding to the right big toe to see if it makes a difference and if not we may have to consider other modalities but I do also 1 her back to be checked. We'll see her back again as needed and she will modify shoe gear to try to control her  bunion

## 2014-06-12 ENCOUNTER — Emergency Department (HOSPITAL_COMMUNITY): Payer: Medicare Other

## 2014-06-12 ENCOUNTER — Emergency Department (HOSPITAL_COMMUNITY)
Admission: EM | Admit: 2014-06-12 | Discharge: 2014-06-12 | Disposition: A | Discharge status: Home / Self Care | Payer: Medicare Other | Attending: Emergency Medicine | Admitting: Emergency Medicine

## 2014-06-12 ENCOUNTER — Encounter (HOSPITAL_COMMUNITY): Payer: Self-pay | Admitting: Emergency Medicine

## 2014-06-12 DIAGNOSIS — E119 Type 2 diabetes mellitus without complications: Secondary | ICD-10-CM | POA: Diagnosis not present

## 2014-06-12 DIAGNOSIS — Z880 Allergy status to penicillin: Secondary | ICD-10-CM | POA: Insufficient documentation

## 2014-06-12 DIAGNOSIS — M1711 Unilateral primary osteoarthritis, right knee: Secondary | ICD-10-CM | POA: Diagnosis not present

## 2014-06-12 DIAGNOSIS — Z79899 Other long term (current) drug therapy: Secondary | ICD-10-CM | POA: Diagnosis not present

## 2014-06-12 DIAGNOSIS — Z720 Tobacco use: Secondary | ICD-10-CM | POA: Insufficient documentation

## 2014-06-12 DIAGNOSIS — Z791 Long term (current) use of non-steroidal anti-inflammatories (NSAID): Secondary | ICD-10-CM | POA: Diagnosis not present

## 2014-06-12 DIAGNOSIS — M25561 Pain in right knee: Secondary | ICD-10-CM | POA: Diagnosis present

## 2014-06-12 DIAGNOSIS — I10 Essential (primary) hypertension: Secondary | ICD-10-CM | POA: Insufficient documentation

## 2014-06-12 DIAGNOSIS — F329 Major depressive disorder, single episode, unspecified: Secondary | ICD-10-CM | POA: Insufficient documentation

## 2014-06-12 DIAGNOSIS — M25461 Effusion, right knee: Secondary | ICD-10-CM | POA: Diagnosis not present

## 2014-06-12 DIAGNOSIS — M1990 Unspecified osteoarthritis, unspecified site: Secondary | ICD-10-CM | POA: Diagnosis not present

## 2014-06-12 DIAGNOSIS — J45909 Unspecified asthma, uncomplicated: Secondary | ICD-10-CM | POA: Insufficient documentation

## 2014-06-12 DIAGNOSIS — Z8719 Personal history of other diseases of the digestive system: Secondary | ICD-10-CM | POA: Insufficient documentation

## 2014-06-12 MED ORDER — BUPIVACAINE HCL (PF) 0.5 % IJ SOLN: 20.0000 mL | Freq: Once | INTRAMUSCULAR | Status: DC

## 2014-06-12 MED ORDER — OXYCODONE-ACETAMINOPHEN 5-325 MG PO TABS: 1.0000 | ORAL_TABLET | Freq: Once | ORAL | Status: DC

## 2014-06-12 MED ORDER — METHOCARBAMOL 500 MG PO TABS: 500.0000 mg | ORAL_TABLET | Freq: Two times a day (BID) | ORAL | Status: DC

## 2014-06-12 MED ORDER — METHOCARBAMOL 500 MG PO TABS
750.0000 mg | ORAL_TABLET | Freq: Once | ORAL | Status: AC
  Administered 2014-06-12: 750 mg via ORAL
  Filled 2014-06-12: qty 2

## 2014-06-12 NOTE — ED Notes (Signed)
Pt. reports right knee joint pain with swelling onset this afternoon ,denies injury , pain increases with movement .
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
## 2014-06-12 NOTE — Discharge Instructions (Signed)
Please take ibuprofen '400mg'$  (this is normally 2 over the counter pills) every 6 hours (take with food to minimze stomach irritation).   Take robaxin and/or Vicodin for breakthrough pain, do not drink alcohol, drive, care for children or perfom other critical tasks while taking robaxin and/or Vicodin .  Please follow with your primary care doctor in the next 2 days for a check-up. They must obtain records for further management.   Do not hesitate to return to the Emergency Department for any new, worsening or concerning symptoms.   Please be very careful not to fall! The pain medication and crutches puts you at risk for falls. Please rest as much as possible and try to not stay alone.

## 2014-06-14 DIAGNOSIS — M1711 Unilateral primary osteoarthritis, right knee: Secondary | ICD-10-CM | POA: Diagnosis not present

## 2014-06-17 DIAGNOSIS — K120 Recurrent oral aphthae: Secondary | ICD-10-CM | POA: Diagnosis not present

## 2014-06-21 DIAGNOSIS — M25561 Pain in right knee: Secondary | ICD-10-CM | POA: Diagnosis not present

## 2014-06-21 DIAGNOSIS — M1711 Unilateral primary osteoarthritis, right knee: Secondary | ICD-10-CM | POA: Diagnosis not present

## 2014-06-21 DIAGNOSIS — M25461 Effusion, right knee: Secondary | ICD-10-CM | POA: Diagnosis not present

## 2014-07-03 DIAGNOSIS — Z0000 Encounter for general adult medical examination without abnormal findings: Secondary | ICD-10-CM | POA: Diagnosis not present

## 2014-07-03 DIAGNOSIS — J449 Chronic obstructive pulmonary disease, unspecified: Secondary | ICD-10-CM | POA: Diagnosis not present

## 2014-07-03 DIAGNOSIS — F5104 Psychophysiologic insomnia: Secondary | ICD-10-CM | POA: Diagnosis not present

## 2014-07-03 DIAGNOSIS — Z794 Long term (current) use of insulin: Secondary | ICD-10-CM | POA: Diagnosis not present

## 2014-07-03 DIAGNOSIS — F17210 Nicotine dependence, cigarettes, uncomplicated: Secondary | ICD-10-CM | POA: Diagnosis not present

## 2014-07-03 DIAGNOSIS — M5136 Other intervertebral disc degeneration, lumbar region: Secondary | ICD-10-CM | POA: Diagnosis not present

## 2014-07-03 DIAGNOSIS — E119 Type 2 diabetes mellitus without complications: Secondary | ICD-10-CM | POA: Diagnosis not present

## 2014-07-03 DIAGNOSIS — Z1389 Encounter for screening for other disorder: Secondary | ICD-10-CM | POA: Diagnosis not present

## 2014-07-03 DIAGNOSIS — F324 Major depressive disorder, single episode, in partial remission: Secondary | ICD-10-CM | POA: Diagnosis not present

## 2014-07-03 DIAGNOSIS — I10 Essential (primary) hypertension: Secondary | ICD-10-CM | POA: Diagnosis not present

## 2014-07-03 DIAGNOSIS — K219 Gastro-esophageal reflux disease without esophagitis: Secondary | ICD-10-CM | POA: Diagnosis not present

## 2014-07-12 DIAGNOSIS — M545 Low back pain: Secondary | ICD-10-CM | POA: Diagnosis not present

## 2014-07-16 DIAGNOSIS — J449 Chronic obstructive pulmonary disease, unspecified: Secondary | ICD-10-CM | POA: Diagnosis not present

## 2014-08-14 DIAGNOSIS — M7582 Other shoulder lesions, left shoulder: Secondary | ICD-10-CM | POA: Diagnosis not present

## 2014-08-14 DIAGNOSIS — M47812 Spondylosis without myelopathy or radiculopathy, cervical region: Secondary | ICD-10-CM | POA: Diagnosis not present

## 2014-08-14 DIAGNOSIS — M47816 Spondylosis without myelopathy or radiculopathy, lumbar region: Secondary | ICD-10-CM | POA: Diagnosis not present

## 2014-08-14 DIAGNOSIS — M7581 Other shoulder lesions, right shoulder: Secondary | ICD-10-CM | POA: Diagnosis not present

## 2014-08-20 DIAGNOSIS — F331 Major depressive disorder, recurrent, moderate: Secondary | ICD-10-CM | POA: Diagnosis not present

## 2014-09-26 DIAGNOSIS — M545 Low back pain: Secondary | ICD-10-CM | POA: Diagnosis not present

## 2014-10-01 DIAGNOSIS — M545 Low back pain: Secondary | ICD-10-CM | POA: Diagnosis not present

## 2014-10-03 DIAGNOSIS — M5106 Intervertebral disc disorders with myelopathy, lumbar region: Secondary | ICD-10-CM | POA: Diagnosis not present

## 2014-10-08 DIAGNOSIS — Q6100 Congenital renal cyst, unspecified: Secondary | ICD-10-CM | POA: Diagnosis not present

## 2014-10-08 DIAGNOSIS — M5116 Intervertebral disc disorders with radiculopathy, lumbar region: Secondary | ICD-10-CM | POA: Diagnosis not present

## 2014-10-08 DIAGNOSIS — I700 Atherosclerosis of aorta: Secondary | ICD-10-CM | POA: Diagnosis not present

## 2014-10-09 ENCOUNTER — Other Ambulatory Visit: Payer: Self-pay | Admitting: Internal Medicine

## 2014-10-09 DIAGNOSIS — N281 Cyst of kidney, acquired: Secondary | ICD-10-CM

## 2014-10-19 DIAGNOSIS — N390 Urinary tract infection, site not specified: Secondary | ICD-10-CM | POA: Diagnosis not present

## 2014-10-26 ENCOUNTER — Ambulatory Visit
Admission: RE | Admit: 2014-10-26 | Discharge: 2014-10-26 | Disposition: A | Discharge status: Home / Self Care | Payer: Medicare Other | Source: Ambulatory Visit | Attending: Internal Medicine | Admitting: Internal Medicine

## 2014-10-26 DIAGNOSIS — N281 Cyst of kidney, acquired: Secondary | ICD-10-CM | POA: Diagnosis not present

## 2014-10-26 MED ORDER — GADOBENATE DIMEGLUMINE 529 MG/ML IV SOLN
18.0000 mL | Freq: Once | INTRAVENOUS | Status: AC | PRN
  Administered 2014-10-26: 18 mL via INTRAVENOUS

## 2014-10-31 DIAGNOSIS — M5106 Intervertebral disc disorders with myelopathy, lumbar region: Secondary | ICD-10-CM | POA: Diagnosis not present

## 2014-10-31 DIAGNOSIS — M545 Low back pain: Secondary | ICD-10-CM | POA: Diagnosis not present

## 2014-11-05 DIAGNOSIS — I10 Essential (primary) hypertension: Secondary | ICD-10-CM | POA: Diagnosis not present

## 2014-11-05 DIAGNOSIS — Z23 Encounter for immunization: Secondary | ICD-10-CM | POA: Diagnosis not present

## 2014-11-05 DIAGNOSIS — E119 Type 2 diabetes mellitus without complications: Secondary | ICD-10-CM | POA: Diagnosis not present

## 2014-11-05 DIAGNOSIS — F17210 Nicotine dependence, cigarettes, uncomplicated: Secondary | ICD-10-CM | POA: Diagnosis not present

## 2014-11-05 DIAGNOSIS — F324 Major depressive disorder, single episode, in partial remission: Secondary | ICD-10-CM | POA: Diagnosis not present

## 2014-11-05 DIAGNOSIS — J449 Chronic obstructive pulmonary disease, unspecified: Secondary | ICD-10-CM | POA: Diagnosis not present

## 2014-11-22 DIAGNOSIS — H10413 Chronic giant papillary conjunctivitis, bilateral: Secondary | ICD-10-CM | POA: Diagnosis not present

## 2014-11-22 DIAGNOSIS — H2513 Age-related nuclear cataract, bilateral: Secondary | ICD-10-CM | POA: Diagnosis not present

## 2014-12-05 ENCOUNTER — Ambulatory Visit (INDEPENDENT_AMBULATORY_CARE_PROVIDER_SITE_OTHER): Payer: Medicare Other | Admitting: Women's Health

## 2014-12-05 ENCOUNTER — Encounter: Payer: Self-pay | Admitting: Women's Health

## 2014-12-05 VITALS — BP 120/82 | Ht 64.0 in | Wt 193.0 lb

## 2014-12-05 DIAGNOSIS — L298 Other pruritus: Secondary | ICD-10-CM

## 2014-12-05 DIAGNOSIS — N898 Other specified noninflammatory disorders of vagina: Secondary | ICD-10-CM

## 2014-12-05 DIAGNOSIS — R350 Frequency of micturition: Secondary | ICD-10-CM

## 2014-12-05 LAB — URINALYSIS W MICROSCOPIC + REFLEX CULTURE
BILIRUBIN URINE: NEGATIVE
Casts: NONE SEEN [LPF]
Crystals: NONE SEEN [HPF]
Nitrite: NEGATIVE
PH: 5.5 (ref 5.0–8.0)
Specific Gravity, Urine: >1.03 (ref 1.001–1.035)
YEAST: NONE SEEN [HPF]

## 2014-12-05 LAB — WET PREP FOR TRICH, YEAST, CLUE
Clue Cells Wet Prep HPF POC: NONE SEEN
TRICH WET PREP: NONE SEEN
Yeast Wet Prep HPF POC: NONE SEEN

## 2014-12-05 MED ORDER — NYSTATIN-TRIAMCINOLONE 100000-0.1 UNIT/GM-% EX OINT
1.0000 | TOPICAL_OINTMENT | Freq: Two times a day (BID) | CUTANEOUS | Status: DC
Start: 2014-12-05 — End: 2015-05-13

## 2014-12-05 NOTE — Progress Notes (Signed)
Patient ID: Leslie Gamble, female   DOB: 01-27-1940, 74 y.o.   MRN: [id number] Presents with several vague complaints of mild increased urinary frequency, left sided back pain, questionable yeast infection. Reports increased discharge without itching, or irritation. Questionable odor vaginal versus urinary. Denies pain or burning with urination, abdominal pain or fever. Postmenopausal/no HRT/no bleeding. History of diabetes, hypertension, hypercholesterolemia  Exam: Appears well. No CVAT pain lower back radiating into hip, abdomen soft without rebound or radiation, external genitalia atrophic, speculum exam scant discharge wet prep negative. Bimanual no CMT or tenderness with exam. UA: +1 glucose, trace ketones, +1 leukocytes, 20-40 WBCs, many bacteria, 40-60 squamous epithelials.  Rule out UTI Atrophic vaginitis  Plan: Reviewed normality of vaginal exam and wet prep. Urine culture pending will triage based on those results.

## 2014-12-07 LAB — URINE CULTURE: Colony Count: 60000

## 2014-12-10 DIAGNOSIS — F331 Major depressive disorder, recurrent, moderate: Secondary | ICD-10-CM | POA: Diagnosis not present

## 2014-12-19 DIAGNOSIS — J069 Acute upper respiratory infection, unspecified: Secondary | ICD-10-CM | POA: Diagnosis not present

## 2014-12-20 DIAGNOSIS — M170 Bilateral primary osteoarthritis of knee: Secondary | ICD-10-CM | POA: Diagnosis not present

## 2014-12-20 DIAGNOSIS — M778 Other enthesopathies, not elsewhere classified: Secondary | ICD-10-CM | POA: Diagnosis not present

## 2014-12-21 DIAGNOSIS — M1711 Unilateral primary osteoarthritis, right knee: Secondary | ICD-10-CM | POA: Diagnosis not present

## 2014-12-21 DIAGNOSIS — M25561 Pain in right knee: Secondary | ICD-10-CM | POA: Diagnosis not present

## 2014-12-21 DIAGNOSIS — M25461 Effusion, right knee: Secondary | ICD-10-CM | POA: Diagnosis not present

## 2015-01-15 DIAGNOSIS — F331 Major depressive disorder, recurrent, moderate: Secondary | ICD-10-CM | POA: Diagnosis not present

## 2015-03-01 DIAGNOSIS — R42 Dizziness and giddiness: Secondary | ICD-10-CM | POA: Diagnosis not present

## 2015-03-06 DIAGNOSIS — I700 Atherosclerosis of aorta: Secondary | ICD-10-CM | POA: Diagnosis not present

## 2015-03-06 DIAGNOSIS — K219 Gastro-esophageal reflux disease without esophagitis: Secondary | ICD-10-CM | POA: Diagnosis not present

## 2015-03-06 DIAGNOSIS — I10 Essential (primary) hypertension: Secondary | ICD-10-CM | POA: Diagnosis not present

## 2015-03-06 DIAGNOSIS — E1140 Type 2 diabetes mellitus with diabetic neuropathy, unspecified: Secondary | ICD-10-CM | POA: Diagnosis not present

## 2015-03-06 DIAGNOSIS — J449 Chronic obstructive pulmonary disease, unspecified: Secondary | ICD-10-CM | POA: Diagnosis not present

## 2015-03-06 DIAGNOSIS — F324 Major depressive disorder, single episode, in partial remission: Secondary | ICD-10-CM | POA: Diagnosis not present

## 2015-03-06 DIAGNOSIS — Z794 Long term (current) use of insulin: Secondary | ICD-10-CM | POA: Diagnosis not present

## 2015-03-06 DIAGNOSIS — F17210 Nicotine dependence, cigarettes, uncomplicated: Secondary | ICD-10-CM | POA: Diagnosis not present

## 2015-03-06 DIAGNOSIS — Z23 Encounter for immunization: Secondary | ICD-10-CM | POA: Diagnosis not present

## 2015-03-29 DIAGNOSIS — M170 Bilateral primary osteoarthritis of knee: Secondary | ICD-10-CM | POA: Diagnosis not present

## 2015-04-10 DIAGNOSIS — F331 Major depressive disorder, recurrent, moderate: Secondary | ICD-10-CM | POA: Diagnosis not present

## 2015-04-12 ENCOUNTER — Ambulatory Visit
Admission: RE | Admit: 2015-04-12 | Discharge: 2015-04-12 | Disposition: A | Discharge status: Home / Self Care | Payer: Medicare Other | Source: Ambulatory Visit | Attending: Internal Medicine | Admitting: Internal Medicine

## 2015-04-12 ENCOUNTER — Other Ambulatory Visit: Payer: Self-pay | Admitting: Internal Medicine

## 2015-04-12 DIAGNOSIS — R0989 Other specified symptoms and signs involving the circulatory and respiratory systems: Secondary | ICD-10-CM

## 2015-04-12 DIAGNOSIS — R05 Cough: Secondary | ICD-10-CM | POA: Diagnosis not present

## 2015-04-12 DIAGNOSIS — J309 Allergic rhinitis, unspecified: Secondary | ICD-10-CM | POA: Diagnosis not present

## 2015-04-12 DIAGNOSIS — R42 Dizziness and giddiness: Secondary | ICD-10-CM | POA: Diagnosis not present

## 2015-05-07 ENCOUNTER — Inpatient Hospital Stay (HOSPITAL_COMMUNITY)
Admission: EM | Admit: 2015-05-07 | Discharge: 2015-05-13 | DRG: 378 | Disposition: A | Discharge status: Home / Self Care | Payer: Medicare Other | Attending: Internal Medicine | Admitting: Internal Medicine

## 2015-05-07 ENCOUNTER — Emergency Department (HOSPITAL_COMMUNITY): Payer: Medicare Other

## 2015-05-07 ENCOUNTER — Encounter (HOSPITAL_COMMUNITY): Payer: Self-pay | Admitting: *Deleted

## 2015-05-07 DIAGNOSIS — J449 Chronic obstructive pulmonary disease, unspecified: Secondary | ICD-10-CM | POA: Diagnosis present

## 2015-05-07 DIAGNOSIS — D62 Acute posthemorrhagic anemia: Secondary | ICD-10-CM | POA: Diagnosis not present

## 2015-05-07 DIAGNOSIS — K5730 Diverticulosis of large intestine without perforation or abscess without bleeding: Secondary | ICD-10-CM | POA: Diagnosis not present

## 2015-05-07 DIAGNOSIS — E118 Type 2 diabetes mellitus with unspecified complications: Secondary | ICD-10-CM | POA: Diagnosis present

## 2015-05-07 DIAGNOSIS — I10 Essential (primary) hypertension: Secondary | ICD-10-CM | POA: Diagnosis present

## 2015-05-07 DIAGNOSIS — Z7984 Long term (current) use of oral hypoglycemic drugs: Secondary | ICD-10-CM

## 2015-05-07 DIAGNOSIS — F17210 Nicotine dependence, cigarettes, uncomplicated: Secondary | ICD-10-CM | POA: Diagnosis present

## 2015-05-07 DIAGNOSIS — K921 Melena: Secondary | ICD-10-CM | POA: Diagnosis present

## 2015-05-07 DIAGNOSIS — E119 Type 2 diabetes mellitus without complications: Secondary | ICD-10-CM

## 2015-05-07 DIAGNOSIS — E669 Obesity, unspecified: Secondary | ICD-10-CM | POA: Diagnosis not present

## 2015-05-07 DIAGNOSIS — F419 Anxiety disorder, unspecified: Secondary | ICD-10-CM | POA: Diagnosis present

## 2015-05-07 DIAGNOSIS — K922 Gastrointestinal hemorrhage, unspecified: Secondary | ICD-10-CM | POA: Diagnosis present

## 2015-05-07 DIAGNOSIS — Z7982 Long term (current) use of aspirin: Secondary | ICD-10-CM

## 2015-05-07 DIAGNOSIS — K5733 Diverticulitis of large intestine without perforation or abscess with bleeding: Principal | ICD-10-CM | POA: Diagnosis present

## 2015-05-07 DIAGNOSIS — K5900 Constipation, unspecified: Secondary | ICD-10-CM | POA: Diagnosis present

## 2015-05-07 DIAGNOSIS — Z6832 Body mass index (BMI) 32.0-32.9, adult: Secondary | ICD-10-CM

## 2015-05-07 HISTORY — DX: Type 2 diabetes mellitus without complications: E11.9

## 2015-05-07 LAB — COMPREHENSIVE METABOLIC PANEL
ALT: 13 U/L — ABNORMAL LOW (ref 14–54)
ANION GAP: 14 (ref 5–15)
AST: 21 U/L (ref 15–41)
Albumin: 3.4 g/dL — ABNORMAL LOW (ref 3.5–5.0)
Alkaline Phosphatase: 55 U/L (ref 38–126)
BILIRUBIN TOTAL: 0.7 mg/dL (ref 0.3–1.2)
BUN: 24 mg/dL — ABNORMAL HIGH (ref 6–20)
CALCIUM: 9 mg/dL (ref 8.9–10.3)
CO2: 24 mmol/L (ref 22–32)
Chloride: 102 mmol/L (ref 101–111)
Creatinine, Ser: 0.62 mg/dL (ref 0.44–1.00)
GFR calc Af Amer: >60 mL/min (ref >60)
GFR calc non Af Amer: >60 mL/min (ref >60)
GLUCOSE: 138 mg/dL — AB (ref 65–99)
Potassium: 3.7 mmol/L (ref 3.5–5.1)
Sodium: 140 mmol/L (ref 135–145)
TOTAL PROTEIN: 6.4 g/dL — AB (ref 6.5–8.1)

## 2015-05-07 LAB — CBC
HCT: 32.6 % — ABNORMAL LOW (ref 36.0–46.0)
HCT: 31.6 % — ABNORMAL LOW (ref 36.0–46.0)
HEMOGLOBIN: 10.4 g/dL — AB (ref 12.0–15.0)
Hemoglobin: 10.1 g/dL — ABNORMAL LOW (ref 12.0–15.0)
MCH: 27.5 pg (ref 26.0–34.0)
MCH: 27.5 pg (ref 26.0–34.0)
MCHC: 31.9 g/dL (ref 30.0–36.0)
MCHC: 32 g/dL (ref 30.0–36.0)
MCV: 86.2 fL (ref 78.0–100.0)
MCV: 86.1 fL (ref 78.0–100.0)
PLATELETS: 344 10*3/uL (ref 150–400)
PLATELETS: 347 10*3/uL (ref 150–400)
RBC: 3.78 MIL/uL — ABNORMAL LOW (ref 3.87–5.11)
RBC: 3.67 MIL/uL — AB (ref 3.87–5.11)
RDW: 15.4 % (ref 11.5–15.5)
RDW: 15.3 % (ref 11.5–15.5)
WBC: 8.1 10*3/uL (ref 4.0–10.5)
WBC: 11.3 10*3/uL — AB (ref 4.0–10.5)

## 2015-05-07 LAB — APTT: APTT: 31 s (ref 24–37)

## 2015-05-07 LAB — PROTIME-INR
INR: 1.12 (ref 0.00–1.49)
PROTHROMBIN TIME: 14.6 s (ref 11.6–15.2)

## 2015-05-07 LAB — ABO/RH: ABO/RH(D): A POS

## 2015-05-07 LAB — CBG MONITORING, ED: Glucose-Capillary: 85 mg/dL (ref 65–99)

## 2015-05-07 LAB — POC OCCULT BLOOD, ED: Fecal Occult Bld: POSITIVE — AB

## 2015-05-07 LAB — GLUCOSE, CAPILLARY: GLUCOSE-CAPILLARY: 161 mg/dL — AB (ref 65–99)

## 2015-05-07 MED ORDER — DULOXETINE HCL 60 MG PO CPEP
60.0000 mg | ORAL_CAPSULE | Freq: Two times a day (BID) | ORAL | Status: DC
  Administered 2015-05-07 – 2015-05-13 (×12): 60 mg via ORAL
  Filled 2015-05-07 (×12): qty 1

## 2015-05-07 MED ORDER — LOSARTAN POTASSIUM-HCTZ 100-25 MG PO TABS: 1.0000 | ORAL_TABLET | Freq: Every day | ORAL | Status: DC

## 2015-05-07 MED ORDER — ONDANSETRON HCL 4 MG PO TABS: 4.0000 mg | ORAL_TABLET | Freq: Four times a day (QID) | ORAL | Status: DC | PRN

## 2015-05-07 MED ORDER — SODIUM CHLORIDE 0.9 % IV BOLUS (SEPSIS)
1000.0000 mL | Freq: Once | INTRAVENOUS | Status: AC
  Administered 2015-05-07: 1000 mL via INTRAVENOUS

## 2015-05-07 MED ORDER — BUSPIRONE HCL 15 MG PO TABS
15.0000 mg | ORAL_TABLET | Freq: Two times a day (BID) | ORAL | Status: DC
  Administered 2015-05-08 – 2015-05-13 (×11): 15 mg via ORAL
  Filled 2015-05-07 (×11): qty 1

## 2015-05-07 MED ORDER — MOMETASONE FURO-FORMOTEROL FUM 100-5 MCG/ACT IN AERO
2.0000 | INHALATION_SPRAY | Freq: Two times a day (BID) | RESPIRATORY_TRACT | Status: DC
  Administered 2015-05-07 – 2015-05-12 (×9): 2 via RESPIRATORY_TRACT
  Filled 2015-05-07 (×2): qty 8.8

## 2015-05-07 MED ORDER — ONDANSETRON HCL 4 MG/2ML IJ SOLN: 4.0000 mg | Freq: Four times a day (QID) | INTRAMUSCULAR | Status: DC | PRN

## 2015-05-07 MED ORDER — SODIUM CHLORIDE 0.9 % IV BOLUS (SEPSIS)
500.0000 mL | Freq: Once | INTRAVENOUS | Status: AC
  Administered 2015-05-07: 500 mL via INTRAVENOUS

## 2015-05-07 MED ORDER — ACETAMINOPHEN 650 MG RE SUPP: 650.0000 mg | Freq: Four times a day (QID) | RECTAL | Status: DC | PRN

## 2015-05-07 MED ORDER — DIPHENHYDRAMINE HCL 25 MG PO CAPS: 25.0000 mg | ORAL_CAPSULE | Freq: Four times a day (QID) | ORAL | Status: DC | PRN

## 2015-05-07 MED ORDER — ACETAMINOPHEN 325 MG PO TABS: 650.0000 mg | ORAL_TABLET | Freq: Four times a day (QID) | ORAL | Status: DC | PRN

## 2015-05-07 MED ORDER — AMLODIPINE BESYLATE 10 MG PO TABS
10.0000 mg | ORAL_TABLET | Freq: Every morning | ORAL | Status: DC
  Administered 2015-05-08 – 2015-05-13 (×5): 10 mg via ORAL
  Filled 2015-05-07 (×6): qty 1

## 2015-05-07 MED ORDER — INSULIN ASPART 100 UNIT/ML ~~LOC~~ SOLN
0.0000 [IU] | Freq: Three times a day (TID) | SUBCUTANEOUS | Status: DC
  Administered 2015-05-08 (×2): 1 [IU] via SUBCUTANEOUS
  Administered 2015-05-09: 2 [IU] via SUBCUTANEOUS
  Administered 2015-05-09 – 2015-05-12 (×8): 1 [IU] via SUBCUTANEOUS
  Administered 2015-05-12: 2 [IU] via SUBCUTANEOUS
  Administered 2015-05-13: 1 [IU] via SUBCUTANEOUS

## 2015-05-07 MED ORDER — SODIUM CHLORIDE 0.9 % IV SOLN
INTRAVENOUS | Status: DC
  Administered 2015-05-07: 19:00:00 via INTRAVENOUS
  Administered 2015-05-08: 1000 mL via INTRAVENOUS
  Administered 2015-05-09 – 2015-05-11 (×4): via INTRAVENOUS

## 2015-05-07 MED ORDER — ALPRAZOLAM 0.5 MG PO TABS
0.5000 mg | ORAL_TABLET | Freq: Two times a day (BID) | ORAL | Status: DC | PRN
  Administered 2015-05-07 – 2015-05-12 (×7): 0.5 mg via ORAL
  Filled 2015-05-07 (×8): qty 1

## 2015-05-07 MED ORDER — IOPAMIDOL (ISOVUE-300) INJECTION 61%
INTRAVENOUS | Status: AC
  Administered 2015-05-07: 100 mL via INTRAVENOUS
  Filled 2015-05-07: qty 100
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
## 2015-05-07 NOTE — ED Notes (Signed)
Pt reports feeling dizzy.  Pts BS checked and noted to be 85.  Pt states shes diabetic and needs to eat.  Advised her her BS was good at 80 and she stated "thats low for me"

## 2015-05-07 NOTE — ED Notes (Signed)
Pt arrives with c/o bright red bleeding from the rectum. Pt states she has been constipated for about 1 week and has now noticed bright red blood when she has a bm. Pt states she is having post nasal drainage which is causing her to be nauseous and she's spitting mucous in a cup. Pt states her entire abdomen is uncomfortable.
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
## 2015-05-07 NOTE — ED Notes (Signed)
Attempted report to [facility]W. 
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
## 2015-05-08 DIAGNOSIS — Z7982 Long term (current) use of aspirin: Secondary | ICD-10-CM | POA: Diagnosis not present

## 2015-05-08 DIAGNOSIS — K5791 Diverticulosis of intestine, part unspecified, without perforation or abscess with bleeding: Secondary | ICD-10-CM

## 2015-05-08 DIAGNOSIS — I10 Essential (primary) hypertension: Secondary | ICD-10-CM

## 2015-05-08 DIAGNOSIS — E118 Type 2 diabetes mellitus with unspecified complications: Secondary | ICD-10-CM | POA: Diagnosis not present

## 2015-05-08 DIAGNOSIS — K921 Melena: Secondary | ICD-10-CM | POA: Diagnosis not present

## 2015-05-08 DIAGNOSIS — J438 Other emphysema: Secondary | ICD-10-CM | POA: Diagnosis not present

## 2015-05-08 DIAGNOSIS — K5733 Diverticulitis of large intestine without perforation or abscess with bleeding: Secondary | ICD-10-CM | POA: Diagnosis present

## 2015-05-08 DIAGNOSIS — E669 Obesity, unspecified: Secondary | ICD-10-CM

## 2015-05-08 DIAGNOSIS — D62 Acute posthemorrhagic anemia: Secondary | ICD-10-CM | POA: Diagnosis not present

## 2015-05-08 DIAGNOSIS — K5900 Constipation, unspecified: Secondary | ICD-10-CM | POA: Diagnosis present

## 2015-05-08 DIAGNOSIS — F419 Anxiety disorder, unspecified: Secondary | ICD-10-CM | POA: Diagnosis present

## 2015-05-08 DIAGNOSIS — Z7984 Long term (current) use of oral hypoglycemic drugs: Secondary | ICD-10-CM | POA: Diagnosis not present

## 2015-05-08 DIAGNOSIS — F17210 Nicotine dependence, cigarettes, uncomplicated: Secondary | ICD-10-CM | POA: Diagnosis present

## 2015-05-08 DIAGNOSIS — J449 Chronic obstructive pulmonary disease, unspecified: Secondary | ICD-10-CM | POA: Diagnosis present

## 2015-05-08 DIAGNOSIS — Z6832 Body mass index (BMI) 32.0-32.9, adult: Secondary | ICD-10-CM | POA: Diagnosis not present

## 2015-05-08 LAB — BASIC METABOLIC PANEL
Anion gap: 7 (ref 5–15)
BUN: 19 mg/dL (ref 6–20)
CALCIUM: 8.3 mg/dL — AB (ref 8.9–10.3)
CHLORIDE: 106 mmol/L (ref 101–111)
CO2: 26 mmol/L (ref 22–32)
CREATININE: 0.66 mg/dL (ref 0.44–1.00)
GFR calc non Af Amer: >60 mL/min (ref >60)
GLUCOSE: 163 mg/dL — AB (ref 65–99)
Potassium: 3.4 mmol/L — ABNORMAL LOW (ref 3.5–5.1)
Sodium: 139 mmol/L (ref 135–145)

## 2015-05-08 LAB — GLUCOSE, CAPILLARY
GLUCOSE-CAPILLARY: 96 mg/dL (ref 65–99)
GLUCOSE-CAPILLARY: 117 mg/dL — AB (ref 65–99)
Glucose-Capillary: 141 mg/dL — ABNORMAL HIGH (ref 65–99)
Glucose-Capillary: 121 mg/dL — ABNORMAL HIGH (ref 65–99)

## 2015-05-08 LAB — CBC
HCT: 22.7 % — ABNORMAL LOW (ref 36.0–46.0)
HEMATOCRIT: 21.5 % — AB (ref 36.0–46.0)
HEMATOCRIT: 24.1 % — AB (ref 36.0–46.0)
HEMOGLOBIN: 8.1 g/dL — AB (ref 12.0–15.0)
HEMOGLOBIN: 7.4 g/dL — AB (ref 12.0–15.0)
Hemoglobin: 7.1 g/dL — ABNORMAL LOW (ref 12.0–15.0)
MCH: 28.4 pg (ref 26.0–34.0)
MCH: 28.7 pg (ref 26.0–34.0)
MCH: 27.7 pg (ref 26.0–34.0)
MCHC: 33 g/dL (ref 30.0–36.0)
MCHC: 33.6 g/dL (ref 30.0–36.0)
MCHC: 32.6 g/dL (ref 30.0–36.0)
MCV: 85 fL (ref 78.0–100.0)
MCV: 85.5 fL (ref 78.0–100.0)
MCV: 86 fL (ref 78.0–100.0)
PLATELETS: 268 10*3/uL (ref 150–400)
PLATELETS: 263 10*3/uL (ref 150–400)
Platelets: 274 10*3/uL (ref 150–400)
RBC: 2.5 MIL/uL — ABNORMAL LOW (ref 3.87–5.11)
RBC: 2.67 MIL/uL — AB (ref 3.87–5.11)
RBC: 2.82 MIL/uL — ABNORMAL LOW (ref 3.87–5.11)
RDW: 15.4 % (ref 11.5–15.5)
RDW: 15.5 % (ref 11.5–15.5)
RDW: 15.6 % — AB (ref 11.5–15.5)
WBC: 8.3 10*3/uL (ref 4.0–10.5)
WBC: 8.5 10*3/uL (ref 4.0–10.5)
WBC: 9.2 10*3/uL (ref 4.0–10.5)

## 2015-05-08 LAB — HEMOGLOBIN A1C
Hgb A1c MFr Bld: 6.9 % — ABNORMAL HIGH (ref 4.8–5.6)
Mean Plasma Glucose: 151 mg/dL

## 2015-05-08 LAB — PREPARE RBC (CROSSMATCH)

## 2015-05-08 MED ORDER — OXYCODONE-ACETAMINOPHEN 5-325 MG PO TABS
1.0000 | ORAL_TABLET | Freq: Once | ORAL | Status: AC
  Administered 2015-05-08: 1 via ORAL
  Filled 2015-05-08: qty 1

## 2015-05-08 MED ORDER — SODIUM CHLORIDE 0.9 % IV SOLN
Freq: Once | INTRAVENOUS | Status: AC
  Administered 2015-05-08: 16:00:00 via INTRAVENOUS

## 2015-05-08 MED ORDER — OXYCODONE-ACETAMINOPHEN 5-325 MG PO TABS
1.0000 | ORAL_TABLET | Freq: Four times a day (QID) | ORAL | Status: DC | PRN
  Administered 2015-05-09 – 2015-05-12 (×5): 1 via ORAL
  Filled 2015-05-08 (×5): qty 1

## 2015-05-08 MED ORDER — HYDROCODONE-ACETAMINOPHEN 5-325 MG PO TABS
2.0000 | ORAL_TABLET | Freq: Once | ORAL | Status: DC
  Filled 2015-05-08: qty 2

## 2015-05-08 NOTE — Progress Notes (Signed)
GASTROENTEROLOGY PROGRESS NOTE  Problem:   Lower GI bleed   Subjective: Denies recent BM's or passage of bld.  Nurse note indicates large dark stool early this a.m.  Pt repts some LLQ discomfort.  Passing lots of gas.  Objective: Odor of blood present in room.   VSS.  Pt sitting up in bed, NAD, coherent.   Abd soft, NT, no distension or significant tympany.  Hgb has dropped to 7.1 but rate of decrease appears to be tapering off.   Assessment: 1.  Probable diverticular bleed which appears to be slowing down. 2. Mod severe acute posthemorrhagic anemia.   Plan: 1.  Agree w/ plan for transfusion 2.  If acute destabilizing bleeding occurs, consider bleeding scan to be followed by IR embolization if +. 3.  If smoldering bleeding continues, consider colonoscopy.  Cleotis Nipper, M.D. 05/08/2015 2:21 PM  Pager [phone] If no answer or after 5 PM call [phone]

## 2015-05-08 NOTE — Care Management Note (Signed)
Case Management Note  Patient Details  Name: AUSTIN PONGRATZ MRN: [id number] Date of Birth: Dec 15, 1940  Subjective/Objective:     Admitted with hematochezia,hx of HTN, depression, asthma, diverticulitis, internal hemorrhoids.Lives alone, independent with ADL's PTA. Uses a cane with ambulation. PCP: Dr. Wenda Low.    Action/Plan: GI following  Expected Discharge Date:                  Expected Discharge Plan:  Home/Self Care  In-House Referral:     Discharge planning Services  CM Consult  Post Acute Care Choice:    Choice offered to:     DME Arranged:    DME Agency:     HH Arranged:    HH Agency:     Status of Service:  In process, will continue to follow  Medicare Important Message Given:    Date Medicare IM Given:    Medicare IM give by:    Date Additional Medicare IM Given:    Additional Medicare Important Message give by:     If discussed at [place] of Stay Meetings, dates discussed:    Additional Comments:  Sharin Mons, [place] [phone] 05/08/2015, 10:50 AM

## 2015-05-08 NOTE — Progress Notes (Signed)
Progress Note    Leslie Gamble  JOA:[id number] DOB: 06-26-1940  DOA: 05/07/2015 PCP: Wenda Low, MD   Outpatient Specialists:   Dr. Christinia Gully, Pulmonology  Dr. Uvaldo Rising, Gynecology  Dr. Wynetta Emery, GI   Brief Narrative:   Leslie Gamble is an 75 y.o. female with a PMH of diabetes and pancolonic diverticulosis confirmed by colonoscopy 09/2012 who was admitted 05/07/15 for evaluation of painless rectal bleeding.  Assessment/Plan:   Principal Problem:   Hematochezia/Gastrointestinal bleeding, presumed diverticular with acute blood loss anemia Continue supportive care with bowel rest, IV fluids, serial CBCs. Has had over 3 g drop in hemoglobin since admission, so will transfuse 2 units of PRBCs today given ongoing active bleeding. Status post GI evaluation. No current indication for colonoscopy.  Active Problems:   Diabetes mellitus, type 2 ([place]) with complication Currently being managed with insulin sensitive SSI 3 times a day. Hemoglobin A1c of 6.9% indicates good outpatient control.    COPD GOLD II Continue Dulera.    Obesity BMI is 32.    Essential hypertension Continue Norvasc.   Family Communication/Anticipated D/C date and plan/Code Status   DVT prophylaxis: SCDs ordered. Code Status: Full Code.  Family Communication: Niece at the bedside. Disposition Plan: Home in 2-3 days depending on stability of hemoglobin and resolution of rectal bleeding.   Medical Consultants:    Gastroenterology   Procedures:    Anti-Infectives:   Anti-infectives    None      Subjective:   Leslie Gamble continues to have rectal bleeding and passing of clots. No complaints of abdominal pain.  Objective:    Filed Vitals:   05/07/15 1744 05/07/15 2114 05/07/15 2245 05/08/15 0620  BP:   129/58 131/55  Pulse:   91 93  Temp:   98.8 F (37.1 C) 98.7 F (37.1 C)  TempSrc:   Oral Oral  Resp:   18 18  Height: '5\' 6"'$  (1.676 m)     Weight: 89.812 kg (198 lb)      SpO2:  98% 98% 97%    Intake/Output Summary (Last 24 hours) at 05/08/15 1345 Last data filed at 05/08/15 1340  Gross per 24 hour  Intake 1528.75 ml  Output   1000 ml  Net 528.75 ml   Filed Weights   05/07/15 1744  Weight: 89.812 kg (198 lb)    Exam: General exam: Appears calm and comfortable.  Respiratory system: Clear to auscultation. Respiratory effort normal. Cardiovascular system: S1 & S2 heard, RRR. No JVD, rubs, gallops or clicks. 1/6 systolic ejection murmur. No pedal edema. Gastrointestinal system: Abdomen is nondistended, soft and nontender. No organomegaly or masses felt. Normal bowel sounds heard. Central nervous system: Alert and oriented. No focal neurological deficits. Extremities: Symmetric 5 x 5 power. No clubbing, edema, or cyanosis. Skin: No rashes, lesions or ulcers Psychiatry: Judgement and insight appear normal. Mood & affect appropriate.   Data Reviewed:   I have personally reviewed following labs and imaging studies:  Labs: Basic Metabolic Panel:  Recent Labs Lab 05/07/15 1054 05/08/15 0002  NA 140 139  K 3.7 3.4*  CL 102 106  CO2 24 26  GLUCOSE 138* 163*  BUN 24* 19  CREATININE 0.62 0.66  CALCIUM 9.0 8.3*   GFR Estimated Creatinine Clearance: 70.7 mL/min (by C-G formula based on Cr of 0.66). Liver Function Tests:  Recent Labs Lab 05/07/15 1054  AST 21  ALT 13*  ALKPHOS 55  BILITOT 0.7  PROT 6.4*  ALBUMIN 3.4*  Coagulation profile  Recent Labs Lab 05/07/15 1536  INR 1.12    CBC:  Recent Labs Lab 05/07/15 1054 05/07/15 1809 05/08/15 0002 05/08/15 0541 05/08/15 1137  WBC 8.1 11.3* 9.2 8.5 8.3  HGB 10.4* 10.1* 8.1* 7.4* 7.1*  HCT 32.6* 31.6* 24.1* 22.7* 21.5*  MCV 86.2 86.1 85.5 85.0 86.0  PLT 344 347 274 268 263   CBG:  Recent Labs Lab 05/07/15 1544 05/07/15 2242 05/08/15 0748 05/08/15 1239  GLUCAP 85 161* 141* 121*   Hgb A1c:  Recent Labs  05/07/15 1536  HGBA1C 6.9*   Sepsis Labs:  Recent  Labs Lab 05/07/15 1809 05/08/15 0002 05/08/15 0541 05/08/15 1137  WBC 11.3* 9.2 8.5 8.3   Urine analysis:    Component Value Date/Time   COLORURINE DARK YELLOW 12/05/2014 1448   APPEARANCEUR CLOUDY* 12/05/2014 1448   LABSPEC >1.030 12/05/2014 1448   PHURINE 5.5 12/05/2014 1448   GLUCOSEU 1+* 12/05/2014 1448   GLUCOSEU NEGATIVE 08/31/2012 0836   HGBUR TRACE* 12/05/2014 1448   BILIRUBINUR NEGATIVE 12/05/2014 1448   KETONESUR TRACE* 12/05/2014 1448   PROTEINUR 2+* 12/05/2014 1448   UROBILINOGEN 0.2 05/31/2014 1402   NITRITE NEGATIVE 12/05/2014 1448   LEUKOCYTESUR 1+* 12/05/2014 1448   Microbiology No results found for this or any previous visit (from the past 240 hour(s)).  Radiology: Ct Abdomen Pelvis W Contrast  05/07/2015  CLINICAL DATA:  Lower abdominal pain with bright red rectal bleeding. EXAM: CT ABDOMEN AND PELVIS WITH CONTRAST TECHNIQUE: Multidetector CT imaging of the abdomen and pelvis was performed using the standard protocol following bolus administration of intravenous contrast. CONTRAST:  1 ISOVUE-300 IOPAMIDOL (ISOVUE-300) INJECTION 61% COMPARISON:  07/30/2009 FINDINGS: Lower chest and abdominal wall:  No acute finding. Extensive right coronary atherosclerotic calcification Hepatobiliary: Hepatic steatosis without focal finding.No evidence of biliary obstruction or stone. Pancreas: Heterogeneous from fatty atrophy. Spleen: Unremarkable. Adrenals/Urinary Tract: Negative adrenals. No hydronephrosis or stone. The right renal lesion described on 10/26/2014 MRI is not well seen. Unremarkable bladder. Reproductive:No pathologic findings. Stomach/Bowel: Extensive colonic diverticulosis. No other explanation for rectal bleeding. Sigmoidectomy reportedly for diverticulitis. No appendicitis. Vascular/Lymphatic: No acute vascular abnormality. There is chronic fat infiltration at the root of the small bowel mesenteric without adenopathy or mass. Given stability this is likely  incidental from remote inflammation. Well marginated fatty structure no lower quadrant may be from remote fat necrosis. Finding separate from the left ovary and sigmoid colon. Peritoneal: No ascites or pneumoperitoneum. Musculoskeletal: No acute abnormalities. Advanced L4-5 facet degeneration with grade 1 anterolisthesis and canal stenosis. IMPRESSION: 1. No acute finding to explain abdominal pain. 2. Extensive colonic diverticulosis. No other imaging explanation for rectal bleeding. 3. Hepatic steatosis. Electronically Signed   By: Monte Fantasia M.D.   On: 05/07/2015 12:35    Medications:   . amLODipine  10 mg Oral q morning - 10a  . busPIRone  15 mg Oral BID  . DULoxetine  60 mg Oral BID  . insulin aspart  0-9 Units Subcutaneous TID WC  . mometasone-formoterol  2 puff Inhalation BID   Continuous Infusions: . sodium chloride 75 mL/hr at 05/08/15 0610    Time spent: 35 minutes with > 50% of time discussing current diagnostic test results, clinical impression and plan of care.      [facility] Pager [phone]. If unable to reach me by pager, please call my cell phone at [phone].  *Please refer to [email or web address], password [email or web address] to get updated schedule on who will round on this  patient, as hospitalists switch teams weekly. If 7PM-7AM, please contact night-coverage at [email or web address], password [email or web address] for any overnight needs.  05/08/2015, 1:45 PM

## 2015-05-08 NOTE — Progress Notes (Signed)
Pt had 400 cc of thick, clotting dark stool this am.

## 2015-05-09 LAB — CBC
HEMATOCRIT: 27.7 % — AB (ref 36.0–46.0)
Hemoglobin: 9.1 g/dL — ABNORMAL LOW (ref 12.0–15.0)
MCH: 27.7 pg (ref 26.0–34.0)
MCHC: 32.9 g/dL (ref 30.0–36.0)
MCV: 84.5 fL (ref 78.0–100.0)
Platelets: 278 10*3/uL (ref 150–400)
RBC: 3.28 MIL/uL — ABNORMAL LOW (ref 3.87–5.11)
RDW: 15.5 % (ref 11.5–15.5)
WBC: 9.1 10*3/uL (ref 4.0–10.5)

## 2015-05-09 LAB — TYPE AND SCREEN
ABO/RH(D): A POS
ANTIBODY SCREEN: NEGATIVE
UNIT DIVISION: 0
Unit division: 0
Unit division: 0

## 2015-05-09 LAB — GLUCOSE, CAPILLARY
Glucose-Capillary: 104 mg/dL — ABNORMAL HIGH (ref 65–99)
Glucose-Capillary: 128 mg/dL — ABNORMAL HIGH (ref 65–99)
Glucose-Capillary: 145 mg/dL — ABNORMAL HIGH (ref 65–99)
Glucose-Capillary: 147 mg/dL — ABNORMAL HIGH (ref 65–99)
Glucose-Capillary: 157 mg/dL — ABNORMAL HIGH (ref 65–99)

## 2015-05-09 LAB — HEMOGLOBIN AND HEMATOCRIT, BLOOD
HEMATOCRIT: 27 % — AB (ref 36.0–46.0)
HEMOGLOBIN: 8.8 g/dL — AB (ref 12.0–15.0)

## 2015-05-09 MED ORDER — PROCHLORPERAZINE EDISYLATE 5 MG/ML IJ SOLN: 10.0000 mg | Freq: Once | INTRAMUSCULAR | Status: DC

## 2015-05-09 MED ORDER — ZOLPIDEM TARTRATE 5 MG PO TABS
5.0000 mg | ORAL_TABLET | Freq: Every evening | ORAL | Status: DC | PRN
  Administered 2015-05-09: 5 mg via ORAL
  Filled 2015-05-09: qty 1

## 2015-05-09 MED ORDER — ALUM & MAG HYDROXIDE-SIMETH 200-200-20 MG/5ML PO SUSP
30.0000 mL | Freq: Once | ORAL | Status: AC
  Administered 2015-05-09: 30 mL via ORAL
  Filled 2015-05-09: qty 30

## 2015-05-09 MED ORDER — POLYETHYLENE GLYCOL 3350 17 G PO PACK
17.0000 g | PACK | Freq: Two times a day (BID) | ORAL | Status: DC
  Administered 2015-05-10 – 2015-05-13 (×7): 17 g via ORAL
  Filled 2015-05-09 (×7): qty 1

## 2015-05-09 NOTE — Progress Notes (Signed)
Appropriate posttransfusion rise in hemoglobin.   Patient has had several bloody bowel movements over the past 12-14 hours, although they have been dark in color, suggesting passage of old blood.   The most recent bowel movement was just a short while ago, and was rather large in amount, dark liquid, not really maroon, more of a dark burgundy, no clots.  The patient is comfortable, although she is somewhat "fidgety." Vital signs are unremarkable, and the abdomen is completely nontender.  Impression:The patient could have a slow ooze, but does not appear to be having any brisk active bleeding.  Plan:  1. Continue current management and plan as stipulated in yesterday's note.  Specifically, consider bleeding scan if acute brisk bleeding occurs. Consider colonoscopy if persistent low-grade bleeding occurs.  2. For now, would continue clear liquid diet, in the event that colonoscopy is needed.  3. Have ordered CBC every 12 hours to monitor patient's hemoglobin  4. Will order MiraLAX to help rinse old stool and blood out of the colon. This should help [place] know sooner whether the recent bloody stools are the result of ongoing low-grade bleeding, versus delayed passage of previous bleeding.  Cleotis Nipper, M.D. Pager [phone] If no answer or after 5 PM call [phone]

## 2015-05-09 NOTE — Progress Notes (Signed)
Progress Note    KANDA DELUNA  [id number]:[id number] DOB: [birth year]  DOA: 05/07/2015 PCP: Wenda Low, MD   Outpatient Specialists:   Dr. Christinia Gully, Pulmonology  Dr. Uvaldo Rising, Gynecology  Dr. Wynetta Emery, GI   Brief Narrative:   Leslie Gamble is an 75 y.o. female with a PMH of diabetes and pancolonic diverticulosis confirmed by colonoscopy 09/2012 who was admitted 05/07/15 for evaluation of painless rectal bleeding.  Assessment/Plan:   Principal Problem:   Hematochezia/Gastrointestinal bleeding, presumed diverticular with acute blood loss anemia Continue supportive care with bowel rest, IV fluids, serial CBCs. Has had over 3 g drop in hemoglobin since admission, so was transfused 2 units of PRBCs 05/08/15. Hemoglobin stable this morning. Status post GI evaluation. No current indication for colonoscopy. Continue to monitor given ongoing active rectal bleeding.  Active Problems:   Diabetes mellitus, type 2 ([place]) with complication Currently being managed with insulin sensitive SSI 3 times a day. CBGs 96-147. Hemoglobin A1c of 6.9% indicates good outpatient control.    COPD GOLD II Continue Dulera.    Obesity BMI is 32.    Essential hypertension Continue Norvasc.   Family Communication/Anticipated D/C date and plan/Code Status   DVT prophylaxis: SCDs ordered. Code Status: Full Code.  Family Communication: Daughter at the bedside. Disposition Plan: Home in 2-3 days depending on stability of hemoglobin and resolution of rectal bleeding.   Medical Consultants:    Gastroenterology   Procedures:    Anti-Infectives:   Anti-infectives    None      Subjective:   MIRHA BRUCATO continues to have rectal bleeding and passing of clots. No complaints of abdominal pain.Feels a bit anxious related to the blood loss.  Objective:    Filed Vitals:   05/08/15 2328 05/08/15 2355 05/09/15 0223 05/09/15 0517  BP: 165/70 140/58 112/56 120/53  Pulse: 87 85 86 84    Temp: 98.1 F (36.7 C) 98.4 F (36.9 C) 98.1 F (36.7 C) 98.5 F (36.9 C)  TempSrc: Oral Oral Oral   Resp: '18 18 18 18  '$ Height:      Weight:      SpO2: 98% 98% 95% 92%    Intake/Output Summary (Last 24 hours) at 05/09/15 0851 Last data filed at 05/09/15 0622  Gross per 24 hour  Intake   1583 ml  Output   1700 ml  Net   -117 ml   Filed Weights   05/07/15 1744  Weight: 89.812 kg (198 lb)    Exam: General exam: Appears mildly anxious and comfortable.  Respiratory system: Clear to auscultation. Respiratory effort normal. Cardiovascular system: S1 & S2 heard, RRR. No JVD, rubs, gallops or clicks. 1/6 systolic ejection murmur. No pedal edema. Gastrointestinal system: Abdomen is nondistended, soft and nontender. No organomegaly or masses felt. Normal bowel sounds heard. Central nervous system: Alert and oriented. No focal neurological deficits. Extremities: Symmetric 5 x 5 power. No clubbing, edema, or cyanosis. Skin: No rashes, lesions or ulcers Psychiatry: Judgement and insight appear normal. Mood & affect appropriate.   Data Reviewed:   I have personally reviewed following labs and imaging studies:  Labs: Basic Metabolic Panel:  Recent Labs Lab 05/07/15 1054 05/08/15 0002  NA 140 139  K 3.7 3.4*  CL 102 106  CO2 24 26  GLUCOSE 138* 163*  BUN 24* 19  CREATININE 0.62 0.66  CALCIUM 9.0 8.3*   GFR Estimated Creatinine Clearance: 70.7 mL/min (by C-G formula based on Cr of 0.66). Liver Function Tests:  Recent Labs Lab 05/07/15 1054  AST 21  ALT 13*  ALKPHOS 55  BILITOT 0.7  PROT 6.4*  ALBUMIN 3.4*   Coagulation profile  Recent Labs Lab 05/07/15 1536  INR 1.12    CBC:  Recent Labs Lab 05/07/15 1054 05/07/15 1809 05/08/15 0002 05/08/15 0541 05/08/15 1137 05/09/15 0530  WBC 8.1 11.3* 9.2 8.5 8.3  --   HGB 10.4* 10.1* 8.1* 7.4* 7.1* 8.8*  HCT 32.6* 31.6* 24.1* 22.7* 21.5* 27.0*  MCV 86.2 86.1 85.5 85.0 86.0  --   PLT 344 347 274 268 263   --    CBG:  Recent Labs Lab 05/08/15 1239 05/08/15 1749 05/08/15 2155 05/09/15 0009 05/09/15 0755  GLUCAP 121* 96 117* 147* 128*   Hgb A1c:  Recent Labs  05/07/15 1536  HGBA1C 6.9*   Sepsis Labs:  Recent Labs Lab 05/07/15 1809 05/08/15 0002 05/08/15 0541 05/08/15 1137  WBC 11.3* 9.2 8.5 8.3   Urine analysis:    Component Value Date/Time   COLORURINE DARK YELLOW 12/05/2014 1448   APPEARANCEUR CLOUDY* 12/05/2014 1448   LABSPEC >1.030 12/05/2014 1448   PHURINE 5.5 12/05/2014 1448   GLUCOSEU 1+* 12/05/2014 1448   GLUCOSEU NEGATIVE 08/31/2012 0836   HGBUR TRACE* 12/05/2014 1448   BILIRUBINUR NEGATIVE 12/05/2014 1448   KETONESUR TRACE* 12/05/2014 1448   PROTEINUR 2+* 12/05/2014 1448   UROBILINOGEN 0.2 05/31/2014 1402   NITRITE NEGATIVE 12/05/2014 1448   LEUKOCYTESUR 1+* 12/05/2014 1448   Microbiology No results found for this or any previous visit (from the past 240 hour(s)).  Radiology: Ct Abdomen Pelvis W Contrast  05/07/2015  CLINICAL DATA:  Lower abdominal pain with bright red rectal bleeding. EXAM: CT ABDOMEN AND PELVIS WITH CONTRAST TECHNIQUE: Multidetector CT imaging of the abdomen and pelvis was performed using the standard protocol following bolus administration of intravenous contrast. CONTRAST:  1 ISOVUE-300 IOPAMIDOL (ISOVUE-300) INJECTION 61% COMPARISON:  07/30/2009 FINDINGS: Lower chest and abdominal wall:  No acute finding. Extensive right coronary atherosclerotic calcification Hepatobiliary: Hepatic steatosis without focal finding.No evidence of biliary obstruction or stone. Pancreas: Heterogeneous from fatty atrophy. Spleen: Unremarkable. Adrenals/Urinary Tract: Negative adrenals. No hydronephrosis or stone. The right renal lesion described on 10/26/2014 MRI is not well seen. Unremarkable bladder. Reproductive:No pathologic findings. Stomach/Bowel: Extensive colonic diverticulosis. No other explanation for rectal bleeding. Sigmoidectomy reportedly for  diverticulitis. No appendicitis. Vascular/Lymphatic: No acute vascular abnormality. There is chronic fat infiltration at the root of the small bowel mesenteric without adenopathy or mass. Given stability this is likely incidental from remote inflammation. Well marginated fatty structure no lower quadrant may be from remote fat necrosis. Finding separate from the left ovary and sigmoid colon. Peritoneal: No ascites or pneumoperitoneum. Musculoskeletal: No acute abnormalities. Advanced L4-5 facet degeneration with grade 1 anterolisthesis and canal stenosis. IMPRESSION: 1. No acute finding to explain abdominal pain. 2. Extensive colonic diverticulosis. No other imaging explanation for rectal bleeding. 3. Hepatic steatosis. Electronically Signed   By: Monte Fantasia M.D.   On: 05/07/2015 12:35    Medications:   . amLODipine  10 mg Oral q morning - 10a  . busPIRone  15 mg Oral BID  . DULoxetine  60 mg Oral BID  . insulin aspart  0-9 Units Subcutaneous TID WC  . mometasone-formoterol  2 puff Inhalation BID   Continuous Infusions: . sodium chloride 75 mL/hr at 05/09/15 0226    Time spent: 35 minutes with > 50% of time discussing current diagnostic test results, clinical impression and plan of care.  LOS: 1 day   Shafter Jupin  [facility] Pager [phone]. If unable to reach me by pager, please call my cell phone at [phone].  *Please refer to [email or web address], password [email or web address] to get updated schedule on who will round on this patient, as hospitalists switch teams weekly. If 7PM-7AM, please contact night-coverage at [email or web address], password [email or web address] for any overnight needs.  05/09/2015, 8:51 AM

## 2015-05-09 NOTE — Progress Notes (Signed)
On-call MD paged concerning patients most recent lab results.

## 2015-05-10 LAB — CBC
HCT: 25.9 % — ABNORMAL LOW (ref 36.0–46.0)
HCT: 27.4 % — ABNORMAL LOW (ref 36.0–46.0)
HEMOGLOBIN: 8.9 g/dL — AB (ref 12.0–15.0)
Hemoglobin: 8.5 g/dL — ABNORMAL LOW (ref 12.0–15.0)
MCH: 27.9 pg (ref 26.0–34.0)
MCH: 28.1 pg (ref 26.0–34.0)
MCHC: 32.8 g/dL (ref 30.0–36.0)
MCHC: 32.5 g/dL (ref 30.0–36.0)
MCV: 84.9 fL (ref 78.0–100.0)
MCV: 86.4 fL (ref 78.0–100.0)
PLATELETS: 262 10*3/uL (ref 150–400)
Platelets: 308 10*3/uL (ref 150–400)
RBC: 3.05 MIL/uL — AB (ref 3.87–5.11)
RBC: 3.17 MIL/uL — ABNORMAL LOW (ref 3.87–5.11)
RDW: 15.6 % — AB (ref 11.5–15.5)
RDW: 15.8 % — ABNORMAL HIGH (ref 11.5–15.5)
WBC: 8.1 10*3/uL (ref 4.0–10.5)
WBC: 10.3 10*3/uL (ref 4.0–10.5)

## 2015-05-10 LAB — GLUCOSE, CAPILLARY
GLUCOSE-CAPILLARY: 123 mg/dL — AB (ref 65–99)
Glucose-Capillary: 126 mg/dL — ABNORMAL HIGH (ref 65–99)
Glucose-Capillary: 124 mg/dL — ABNORMAL HIGH (ref 65–99)
Glucose-Capillary: 114 mg/dL — ABNORMAL HIGH (ref 65–99)

## 2015-05-10 NOTE — Progress Notes (Signed)
GASTROENTEROLOGY PROGRESS NOTE  Problem:   LGIB   Subjective: Recent small stool w/ slt bld, per pt.  One or two addt'l BM's over past 24hr   Objective: VS nl.  Sitting in bed comfortably, NAD. Minimal chg in hgb over past 24hr  Assessment: Still unclear whether slight ooze or (more likely) gradual transit of bld through colon.  Plan: Continue Miralax and clr liq.  Hopefully will be clr of bld by tomorrow; if so, would adv diet.  Otherwise, would prepare for colonoscopy Sunday or Monday.  Leslie Gamble, M.D. 05/10/2015 4:03 PM  Pager [phone] If no answer or after 5 PM call [phone]

## 2015-05-10 NOTE — Progress Notes (Signed)
Progress Note    Leslie Gamble  EQA:[id number] DOB: [birth year]  DOA: 05/07/2015 PCP: Wenda Low, MD   Outpatient Specialists:   Dr. Christinia Gully, Pulmonology  Dr. Uvaldo Rising, Gynecology  Dr. Wynetta Emery, GI   Brief Narrative:   Leslie Gamble is an 75 y.o. female with a PMH of diabetes and pancolonic diverticulosis confirmed by colonoscopy 09/2012 who was admitted 05/07/15 for evaluation of painless rectal bleeding.  Assessment/Plan:   Principal Problem:   Hematochezia/Gastrointestinal bleeding, presumed diverticular with acute blood loss anemia Continue supportive care with bowel rest, IV fluids, serial CBCs. Has had over 3 g drop in hemoglobin since admission, so was transfused 2 units of PRBCs 05/08/15. Hemoglobin stable this morning. Status post GI evaluation. No current indication for colonoscopy. Continue to monitor given ongoing active rectal bleeding.  Active Problems:   Diabetes mellitus, type 2 ([place]) with complication Currently being managed with insulin sensitive SSI 3 times a day. CBGs 10 4-157. Hemoglobin A1c of 6.9% indicates good outpatient control.    COPD GOLD II Continue Dulera.    Obesity BMI is 32.    Essential hypertension Continue Norvasc.   Family Communication/Anticipated D/C date and plan/Code Status   DVT prophylaxis: SCDs ordered. Code Status: Full Code.  Family Communication: Daughter at the bedside. Disposition Plan: Home in 2-3 days depending on stability of hemoglobin and resolution of rectal bleeding.   Medical Consultants:    Gastroenterology   Procedures:    Anti-Infectives:   Anti-infectives    None      Subjective:   Leslie Gamble continues to have rectal bleeding with one bloody bowel movement in the night. No complaints of abdominal pain.She continues to express some anxiety about ongoing blood loss.  Objective:    Filed Vitals:   05/09/15 1939 05/09/15 2207 05/10/15 0517 05/10/15 0812  BP:  130/63  131/74   Pulse:  83 84   Temp:  98.5 F (36.9 C) 98.8 F (37.1 C)   TempSrc:  Oral    Resp:  17 18   Height:      Weight:      SpO2: 98% 96% 99% 94%    Intake/Output Summary (Last 24 hours) at 05/10/15 0919 Last data filed at 05/10/15 0730  Gross per 24 hour  Intake   1920 ml  Output    300 ml  Net   1620 ml   Filed Weights   05/07/15 1744  Weight: 89.812 kg (198 lb)    Exam: General exam: Appears mildly anxious and comfortable.  Respiratory system: Clear to auscultation. Respiratory effort normal. Cardiovascular system: S1 & S2 heard, RRR. No JVD, rubs, gallops or clicks. 1/6 systolic ejection murmur. No pedal edema. Gastrointestinal system: Abdomen is nondistended, soft and nontender. No organomegaly or masses felt. Normal bowel sounds heard. Central nervous system: Alert and oriented. No focal neurological deficits. Extremities: Symmetric 5 x 5 power. No clubbing, edema, or cyanosis. Skin: No rashes, lesions or ulcers Psychiatry: Judgement and insight appear normal. Mood & affect appropriate.   Data Reviewed:   I have personally reviewed following labs and imaging studies:  Labs: Basic Metabolic Panel:  Recent Labs Lab 05/07/15 1054 05/08/15 0002  NA 140 139  K 3.7 3.4*  CL 102 106  CO2 24 26  GLUCOSE 138* 163*  BUN 24* 19  CREATININE 0.62 0.66  CALCIUM 9.0 8.3*   GFR Estimated Creatinine Clearance: 70.7 mL/min (by C-G formula based on Cr of 0.66). Liver Function Tests:  Recent Labs Lab 05/07/15 1054  AST 21  ALT 13*  ALKPHOS 55  BILITOT 0.7  PROT 6.4*  ALBUMIN 3.4*   Coagulation profile  Recent Labs Lab 05/07/15 1536  INR 1.12    CBC:  Recent Labs Lab 05/08/15 0002 05/08/15 0541 05/08/15 1137 05/09/15 0530 05/09/15 1820 05/10/15 0733  WBC 9.2 8.5 8.3  --  9.1 8.1  HGB 8.1* 7.4* 7.1* 8.8* 9.1* 8.5*  HCT 24.1* 22.7* 21.5* 27.0* 27.7* 25.9*  MCV 85.5 85.0 86.0  --  84.5 84.9  PLT 274 268 263  --  278 262   CBG:  Recent  Labs Lab 05/09/15 0755 05/09/15 1207 05/09/15 1715 05/09/15 2205 05/10/15 0801  GLUCAP 128* 157* 104* 145* 126*   Hgb A1c:  Recent Labs  05/07/15 1536  HGBA1C 6.9*   Sepsis Labs:  Recent Labs Lab 05/08/15 0541 05/08/15 1137 05/09/15 1820 05/10/15 0733  WBC 8.5 8.3 9.1 8.1   Urine analysis:    Component Value Date/Time   COLORURINE DARK YELLOW 12/05/2014 1448   APPEARANCEUR CLOUDY* 12/05/2014 1448   LABSPEC >1.030 12/05/2014 1448   PHURINE 5.5 12/05/2014 1448   GLUCOSEU 1+* 12/05/2014 1448   GLUCOSEU NEGATIVE 08/31/2012 0836   HGBUR TRACE* 12/05/2014 1448   BILIRUBINUR NEGATIVE 12/05/2014 1448   KETONESUR TRACE* 12/05/2014 1448   PROTEINUR 2+* 12/05/2014 1448   UROBILINOGEN 0.2 05/31/2014 1402   NITRITE NEGATIVE 12/05/2014 1448   LEUKOCYTESUR 1+* 12/05/2014 1448   Microbiology No results found for this or any previous visit (from the past 240 hour(s)).  Radiology: No results found.  Medications:   . amLODipine  10 mg Oral q morning - 10a  . busPIRone  15 mg Oral BID  . DULoxetine  60 mg Oral BID  . insulin aspart  0-9 Units Subcutaneous TID WC  . mometasone-formoterol  2 puff Inhalation BID  . polyethylene glycol  17 g Oral BID   Continuous Infusions: . sodium chloride 75 mL/hr at 05/09/15 1431    Time spent: 25 minutes.    LOS: 2 days   Rosi Secrist  [facility] Pager [phone]. If unable to reach me by pager, please call my cell phone at [phone].  *Please refer to [email or web address], password [email or web address] to get updated schedule on who will round on this patient, as hospitalists switch teams weekly. If 7PM-7AM, please contact night-coverage at [email or web address], password [email or web address] for any overnight needs.  05/10/2015, 9:19 AM

## 2015-05-10 NOTE — Care Management Important Message (Signed)
Important Message  Patient Details  Name: Leslie Gamble MRN: [id number] Date of Birth: 1940-06-23   Medicare Important Message Given:  Yes    Nathen May 05/10/2015, 10:35 AM

## 2015-05-11 LAB — CBC
HCT: 26.6 % — ABNORMAL LOW (ref 36.0–46.0)
Hemoglobin: 8.6 g/dL — ABNORMAL LOW (ref 12.0–15.0)
MCH: 28.3 pg (ref 26.0–34.0)
MCHC: 32.3 g/dL (ref 30.0–36.0)
MCV: 87.5 fL (ref 78.0–100.0)
PLATELETS: 299 10*3/uL (ref 150–400)
RBC: 3.04 MIL/uL — AB (ref 3.87–5.11)
RDW: 15.9 % — AB (ref 11.5–15.5)
WBC: 9.1 10*3/uL (ref 4.0–10.5)

## 2015-05-11 LAB — GLUCOSE, CAPILLARY
GLUCOSE-CAPILLARY: 126 mg/dL — AB (ref 65–99)
Glucose-Capillary: 124 mg/dL — ABNORMAL HIGH (ref 65–99)
Glucose-Capillary: 133 mg/dL — ABNORMAL HIGH (ref 65–99)
Glucose-Capillary: 146 mg/dL — ABNORMAL HIGH (ref 65–99)

## 2015-05-11 MED ORDER — WHITE PETROLATUM GEL
Status: AC
  Filled 2015-05-11: qty 1

## 2015-05-11 NOTE — Progress Notes (Signed)
GASTROENTEROLOGY PROGRESS NOTE  Problem:   GI bleed, presumed diverticular  Subjective: The patient's stools are somewhat liquid (on MiraLAX), and by her report, contained less blood than before. There is no odor of blood any longer, whereas there had been a strong odor in the past several days.  Objective: The patient appears stronger and more chipper. Vital signs are normal. The abdomen is nontender, including the left lower quadrant where she has had intermittent subjective discomfort.  Hemoglobin this morning is essentially unchanged over the preceding 24 hours.   Assessment: Quiescent diverticular bleed. Posthemorrhagic anemia, acute, moderate.  Plan: Discussed with Dr. Rockne Menghini. Okay to advance diet. Consider discharge in the next couple of days if the patient clears her stool, keeping in mind that diverticular bleeding can often recur after a day or 2 of quiescence, and keeping in mind that this patient is somewhat older, and lives alone.  Cleotis Nipper, M.D. 05/11/2015 12:54 PM  Pager [phone] If no answer or after 5 PM call [phone]

## 2015-05-11 NOTE — Progress Notes (Signed)
Progress Note    Leslie Gamble  YNW:[id number] DOB: Dec 17, 1940  DOA: 05/07/2015 PCP: Wenda Low, MD   Outpatient Specialists:   Dr. Christinia Gully, Pulmonology  Dr. Uvaldo Rising, Gynecology  Dr. Wynetta Emery, GI   Brief Narrative:   Leslie Gamble is an 75 y.o. female with a PMH of diabetes and pancolonic diverticulosis confirmed by colonoscopy 09/2012 who was admitted 05/07/15 for evaluation of painless rectal bleeding.  Assessment/Plan:   Principal Problem:   Hematochezia/Gastrointestinal bleeding, presumed diverticular with acute blood loss anemia Continue supportive care with bowel rest, IV fluids, serial CBCs. Has had over 3 g drop in hemoglobin since admission, so was transfused 2 units of PRBCs 05/08/15. Hemoglobin Remains stable. Status post GI evaluation. No current indication for colonoscopy. Continue to monitor, bleeding slowing down.  Active Problems:   Diabetes mellitus, type 2 ([place]) with complication Currently being managed with insulin sensitive SSI 3 times a day. CBGs 114-126. Hemoglobin A1c of 6.9% indicates good outpatient control.    COPD GOLD II Continue Dulera.    Obesity BMI is 32.    Essential hypertension Continue Norvasc.   Family Communication/Anticipated D/C date and plan/Code Status   DVT prophylaxis: SCDs ordered. Code Status: Full Code.  Family Communication: Daughter at the bedside. Disposition Plan: Home in 2-3 days depending on stability of hemoglobin and resolution of rectal bleeding.   Medical Consultants:    Gastroenterology   Procedures:    Anti-Infectives:   Anti-infectives    None      Subjective:   Leslie Gamble reports decreased rectal bleeding.  Having brown stools with some blood mixed in.  Odor less pungent.  LLQ abdominal pain improved.  Objective:    Filed Vitals:   05/10/15 1939 05/10/15 2139 05/11/15 0453 05/11/15 0741  BP:  142/62 120/50   Pulse:  77 77   Temp:  97.6 F (36.4 C) 98.3 F (36.8  C)   TempSrc:  Oral Oral   Resp:  16 16   Height:      Weight:      SpO2: 97% 97% 98% 96%    Intake/Output Summary (Last 24 hours) at 05/11/15 0844 Last data filed at 05/11/15 0645  Gross per 24 hour  Intake 1066.25 ml  Output      0 ml  Net 1066.25 ml   Filed Weights   05/07/15 1744  Weight: 89.812 kg (198 lb)    Exam: General exam: No distress. Respiratory system: Clear to auscultation. Respiratory effort normal. Cardiovascular system: S1 & S2 heard, RRR. No JVD, rubs, gallops or clicks. 1/6 systolic ejection murmur. No pedal edema. Gastrointestinal system: Abdomen is nondistended, soft and nontender. No organomegaly or masses felt. Normal bowel sounds heard. Central nervous system: Alert and oriented. No focal neurological deficits. Extremities: Symmetric 5 x 5 power. No clubbing, edema, or cyanosis. Skin: No rashes, lesions or ulcers Psychiatry: Judgement and insight appear normal. Mood & affect appropriate.   Data Reviewed:   I have personally reviewed following labs and imaging studies:  Labs: Basic Metabolic Panel:  Recent Labs Lab 05/07/15 1054 05/08/15 0002  NA 140 139  K 3.7 3.4*  CL 102 106  CO2 24 26  GLUCOSE 138* 163*  BUN 24* 19  CREATININE 0.62 0.66  CALCIUM 9.0 8.3*   GFR Estimated Creatinine Clearance: 70.7 mL/min (by C-G formula based on Cr of 0.66). Liver Function Tests:  Recent Labs Lab 05/07/15 1054  AST 21  ALT 13*  ALKPHOS 55  BILITOT 0.7  PROT 6.4*  ALBUMIN 3.4*   Coagulation profile  Recent Labs Lab 05/07/15 1536  INR 1.12    CBC:  Recent Labs Lab 05/08/15 0541 05/08/15 1137 05/09/15 0530 05/09/15 1820 05/10/15 0733 05/10/15 1954  WBC 8.5 8.3  --  9.1 8.1 10.3  HGB 7.4* 7.1* 8.8* 9.1* 8.5* 8.9*  HCT 22.7* 21.5* 27.0* 27.7* 25.9* 27.4*  MCV 85.0 86.0  --  84.5 84.9 86.4  PLT 268 263  --  278 262 308   CBG:  Recent Labs Lab 05/10/15 0801 05/10/15 1238 05/10/15 1707 05/10/15 2137 05/11/15 0803    GLUCAP 126* 124* 114* 123* 124*   Hgb A1c: No results for input(s): HGBA1C in the last 72 hours. Sepsis Labs:  Recent Labs Lab 05/08/15 1137 05/09/15 1820 05/10/15 0733 05/10/15 1954  WBC 8.3 9.1 8.1 10.3   Urine analysis:    Component Value Date/Time   COLORURINE DARK YELLOW 12/05/2014 1448   APPEARANCEUR CLOUDY* 12/05/2014 1448   LABSPEC >1.030 12/05/2014 1448   PHURINE 5.5 12/05/2014 1448   GLUCOSEU 1+* 12/05/2014 1448   GLUCOSEU NEGATIVE 08/31/2012 0836   HGBUR TRACE* 12/05/2014 1448   BILIRUBINUR NEGATIVE 12/05/2014 1448   KETONESUR TRACE* 12/05/2014 1448   PROTEINUR 2+* 12/05/2014 1448   UROBILINOGEN 0.2 05/31/2014 1402   NITRITE NEGATIVE 12/05/2014 1448   LEUKOCYTESUR 1+* 12/05/2014 1448   Microbiology No results found for this or any previous visit (from the past 240 hour(s)).  Radiology: No results found.  Medications:   . amLODipine  10 mg Oral q morning - 10a  . busPIRone  15 mg Oral BID  . DULoxetine  60 mg Oral BID  . insulin aspart  0-9 Units Subcutaneous TID WC  . mometasone-formoterol  2 puff Inhalation BID  . polyethylene glycol  17 g Oral BID   Continuous Infusions: . sodium chloride 75 mL/hr at 05/11/15 0645    Time spent: 25 minutes.    LOS: 3 days   [facility] Pager [phone]. If unable to reach me by pager, please call my cell phone at [phone].  *Please refer to [email or web address], password [email or web address] to get updated schedule on who will round on this patient, as hospitalists switch teams weekly. If 7PM-7AM, please contact night-coverage at [email or web address], password [email or web address] for any overnight needs.  05/11/2015, 8:44 AM

## 2015-05-12 LAB — GLUCOSE, CAPILLARY
GLUCOSE-CAPILLARY: 125 mg/dL — AB (ref 65–99)
GLUCOSE-CAPILLARY: 206 mg/dL — AB (ref 65–99)
Glucose-Capillary: 128 mg/dL — ABNORMAL HIGH (ref 65–99)
Glucose-Capillary: 158 mg/dL — ABNORMAL HIGH (ref 65–99)

## 2015-05-12 LAB — CBC
HEMATOCRIT: 26.2 % — AB (ref 36.0–46.0)
HEMOGLOBIN: 8.3 g/dL — AB (ref 12.0–15.0)
MCH: 27.6 pg (ref 26.0–34.0)
MCHC: 31.7 g/dL (ref 30.0–36.0)
MCV: 87 fL (ref 78.0–100.0)
Platelets: 329 10*3/uL (ref 150–400)
RBC: 3.01 MIL/uL — AB (ref 3.87–5.11)
RDW: 16.2 % — ABNORMAL HIGH (ref 11.5–15.5)
WBC: 8.8 10*3/uL (ref 4.0–10.5)

## 2015-05-12 NOTE — Progress Notes (Signed)
Hgb stable.  No BM's.  [place] for dischg whenever hospitalist feels it is appropriate; the pt feels more comfortable w/ another day of observation, and I think that is very reasonable, given the propensity for diverticular bleeding to recur after a day or two of quiescence.  Will sign off; please call us if needed.  Cleotis Nipper, M.D. Pager [phone] If no answer or after 5 PM call [phone]

## 2015-05-12 NOTE — Progress Notes (Signed)
Progress Note    Leslie Gamble  BTD:[id number] DOB: October 23, 1940  DOA: 05/07/2015 PCP: Wenda Low, MD   Outpatient Specialists:   Dr. Christinia Gully, Pulmonology  Dr. Uvaldo Rising, Gynecology  Dr. Wynetta Emery, GI   Brief Narrative:   Leslie Gamble is an 75 y.o. female with a PMH of diabetes and pancolonic diverticulosis confirmed by colonoscopy 09/2012 who was admitted 05/07/15 for evaluation of painless rectal bleeding.  Assessment/Plan:   Principal Problem:   Hematochezia/Gastrointestinal bleeding, presumed diverticular with acute blood loss anemia Continue supportive care with bowel rest, IV fluids, serial CBCs. Has had over 3 g drop in hemoglobin since admission, so was transfused 2 units of PRBCs 05/08/15. Hemoglobin Remains stable. Status post GI evaluation. No current indication for colonoscopy. Continue to monitor, No stools in the past 24 hours.  Active Problems:   Diabetes mellitus, type 2 ([place]) with complication Currently being managed with insulin sensitive SSI 3 times a day. CBGs 114-126. Hemoglobin A1c of 6.9% indicates good outpatient control.    COPD GOLD II Continue Dulera.    Obesity BMI is 32.    Essential hypertension Continue Norvasc.   Family Communication/Anticipated D/C date and plan/Code Status   DVT prophylaxis: SCDs ordered. Code Status: Full Code.  Family Communication: Daughter at the bedside 05/11/15, no family present today. Disposition Plan: Home tomorrow if no further rectal bleeding and hemoglobin stable.   Medical Consultants:    Gastroenterology   Procedures:    Anti-Infectives:   Anti-infectives    None      Subjective:   Leslie Gamble reports That she has not had any further stools in the past 24 hours. No recurrent abdominal pain. Feels well overall. Appetite good.  Objective:    Filed Vitals:   05/11/15 0741 05/11/15 1344 05/11/15 2340 05/12/15 0528  BP:  146/70 128/55 105/50  Pulse:  85 81 73  Temp:   99.8 F (37.7 C) 99.4 F (37.4 C) 97.9 F (36.6 C)  TempSrc:  Oral Oral Oral  Resp:  '16 16 16  '$ Height:      Weight:      SpO2: 96% 97% 94% 99%    Intake/Output Summary (Last 24 hours) at 05/12/15 0843 Last data filed at 05/12/15 1062  Gross per 24 hour  Intake   1075 ml  Output      0 ml  Net   1075 ml   Filed Weights   05/07/15 1744  Weight: 89.812 kg (198 lb)    Exam: General exam: No distress. Respiratory system: Clear to auscultation. Respiratory effort normal. Cardiovascular system: S1 & S2 heard, RRR. No JVD, rubs, gallops or clicks. 1/6 systolic ejection murmur. No pedal edema. Gastrointestinal system: Abdomen is nondistended, soft and nontender. No organomegaly or masses felt. Normal bowel sounds heard. Central nervous system: Alert and oriented. No focal neurological deficits. Extremities: Symmetric 5 x 5 power. No clubbing, edema, or cyanosis. Skin: No rashes, lesions or ulcers Psychiatry: Judgement and insight appear normal. Mood & affect appropriate.   Data Reviewed:   I have personally reviewed following labs and imaging studies:  Labs: Basic Metabolic Panel:  Recent Labs Lab 05/07/15 1054 05/08/15 0002  NA 140 139  K 3.7 3.4*  CL 102 106  CO2 24 26  GLUCOSE 138* 163*  BUN 24* 19  CREATININE 0.62 0.66  CALCIUM 9.0 8.3*   GFR Estimated Creatinine Clearance: 70.7 mL/min (by C-G formula based on Cr of 0.66). Liver Function Tests:  Recent  Labs Lab 05/07/15 1054  AST 21  ALT 13*  ALKPHOS 55  BILITOT 0.7  PROT 6.4*  ALBUMIN 3.4*   Coagulation profile  Recent Labs Lab 05/07/15 1536  INR 1.12    CBC:  Recent Labs Lab 05/09/15 1820 05/10/15 0733 05/10/15 1954 05/11/15 0819 05/12/15 0728  WBC 9.1 8.1 10.3 9.1 8.8  HGB 9.1* 8.5* 8.9* 8.6* 8.3*  HCT 27.7* 25.9* 27.4* 26.6* 26.2*  MCV 84.5 84.9 86.4 87.5 87.0  PLT 278 262 308 299 329   CBG:  Recent Labs Lab 05/11/15 0803 05/11/15 1219 05/11/15 1709 05/11/15 2239  05/12/15 0805  GLUCAP 124* 133* 126* 146* 128*   Hgb A1c: No results for input(s): HGBA1C in the last 72 hours. Sepsis Labs:  Recent Labs Lab 05/10/15 0733 05/10/15 1954 05/11/15 0819 05/12/15 0728  WBC 8.1 10.3 9.1 8.8   Urine analysis:    Component Value Date/Time   COLORURINE DARK YELLOW 12/05/2014 1448   APPEARANCEUR CLOUDY* 12/05/2014 1448   LABSPEC >1.030 12/05/2014 1448   PHURINE 5.5 12/05/2014 1448   GLUCOSEU 1+* 12/05/2014 1448   GLUCOSEU NEGATIVE 08/31/2012 0836   HGBUR TRACE* 12/05/2014 1448   BILIRUBINUR NEGATIVE 12/05/2014 1448   KETONESUR TRACE* 12/05/2014 1448   PROTEINUR 2+* 12/05/2014 1448   UROBILINOGEN 0.2 05/31/2014 1402   NITRITE NEGATIVE 12/05/2014 1448   LEUKOCYTESUR 1+* 12/05/2014 1448   Microbiology No results found for this or any previous visit (from the past 240 hour(s)).  Radiology: No results found.  Medications:   . amLODipine  10 mg Oral q morning - 10a  . busPIRone  15 mg Oral BID  . DULoxetine  60 mg Oral BID  . insulin aspart  0-9 Units Subcutaneous TID WC  . mometasone-formoterol  2 puff Inhalation BID  . polyethylene glycol  17 g Oral BID   Continuous Infusions:    Time spent: 25 minutes.    LOS: 4 days   [facility] Pager [phone]. If unable to reach me by pager, please call my cell phone at [phone].  *Please refer to [email or web address], password [email or web address] to get updated schedule on who will round on this patient, as hospitalists switch teams weekly. If 7PM-7AM, please contact night-coverage at [email or web address], password [email or web address] for any overnight needs.  05/12/2015, 8:43 AM

## 2015-05-13 LAB — GLUCOSE, CAPILLARY
Glucose-Capillary: 137 mg/dL — ABNORMAL HIGH (ref 65–99)
Glucose-Capillary: 149 mg/dL — ABNORMAL HIGH (ref 65–99)

## 2015-05-13 MED ORDER — POLYETHYLENE GLYCOL 3350 17 G PO PACK: 17.0000 g | PACK | Freq: Every day | ORAL | Status: DC

## 2015-05-13 NOTE — Consult Note (Signed)
   [facility] Inpatient Consult   05/13/2015  Leslie Gamble 19-Jul-1940 [id number]    Patient screened for [facility] Management services. Went to bedside to offer and explain [facility] Care Management program with patient. Patient declined [facility] follow up. [name] states she stays on top of her COPD, DM, and her medications. States she is appreciative of the offer. However, she feels like she is managing well.  Accepted [facility] Care Management brochure with contact information to call in future if changes mind. Will make inpatient RNCM aware that patient declined [facility] program services.  Marthenia Rolling, MSN-Ed, RN,BSN [facility] Liaison [phone]

## 2015-05-13 NOTE — Discharge Summary (Signed)
Physician Discharge Summary  Leslie Gamble YPP:[id number] DOB: 04/04/1940 DOA: 05/07/2015  PCP: Wenda Low, MD  Admit date: 05/07/2015 Discharge date: 05/13/2015   Recommendations for Outpatient Follow-Up:   1. Recommend repeat CBC in 1 week to ensure stability of hemoglobin.   Discharge Diagnosis:   Principal Problem:    Hematochezia Active Problems:    Diabetes mellitus, type 2 ([place])    COPD GOLD II    Obesity    Bleeding gastrointestinal    Diabetes mellitus with complication (HCC)    Essential hypertension    Acute blood loss anemia   Discharge disposition:  Home.    Discharge Condition: Improved.  Diet recommendation: Low sodium, heart healthy.  Carbohydrate-modified.    History of Present Illness:   Leslie Gamble is an 75 y.o. female with a PMH of diabetes and pancolonic diverticulosis confirmed by colonoscopy 09/2012 who was admitted 05/07/15 for evaluation of painless rectal bleeding.  Hospital Course by Problem:   Principal Problem:  Hematochezia/Gastrointestinal bleeding, presumed diverticular with acute blood loss anemia Continue supportive care with bowel rest, IV fluids, serial CBCs. Has had over 3 g drop in hemoglobin since admission, so was transfused 2 units of PRBCs 05/08/15. Hemoglobin Remains stable. Status post GI evaluation. No current indication for colonoscopy. Hemoglobin stable with no further significant GI bleeding 48 hours prior to discharge.  Active Problems:  Diabetes mellitus, type 2 ([place]) with complication Managed with insulin sensitive SSI 3 times a day.  Hemoglobin A1c of 6.9% indicates good outpatient control. Resume preadmission regimen at discharge.   COPD GOLD II Continue Dulera.   Obesity BMI is 32.   Essential hypertension Continue Norvasc.  Medical Consultants:    Gastroenterology   Discharge Exam:   Filed Vitals:   05/12/15 2242 05/13/15 0514  BP: 152/74 128/57  Pulse: 83 80  Temp: 98 F (36.7  C) 98.5 F (36.9 C)  Resp: 16 18   Filed Vitals:   05/12/15 0932 05/12/15 1431 05/12/15 2242 05/13/15 0514  BP:  118/51 152/74 128/57  Pulse:  81 83 80  Temp:  98.1 F (36.7 C) 98 F (36.7 C) 98.5 F (36.9 C)  TempSrc:  Oral    Resp:  '18 16 18  '$ Height:      Weight:      SpO2: 93% 94% 98% 94%    Gen:  NAD Cardiovascular:  RRR, No M/R/G Respiratory: Lungs CTAB Gastrointestinal: Abdomen soft, NT/ND with normal active bowel sounds. Extremities: No C/E/C   The results of significant diagnostics from this hospitalization (including imaging, microbiology, ancillary and laboratory) are listed below for reference.     Procedures and Diagnostic Studies:   Ct Abdomen Pelvis W Contrast  05/07/2015  CLINICAL DATA:  Lower abdominal pain with bright red rectal bleeding. EXAM: CT ABDOMEN AND PELVIS WITH CONTRAST TECHNIQUE: Multidetector CT imaging of the abdomen and pelvis was performed using the standard protocol following bolus administration of intravenous contrast. CONTRAST:  1 ISOVUE-300 IOPAMIDOL (ISOVUE-300) INJECTION 61% COMPARISON:  07/30/2009 FINDINGS: Lower chest and abdominal wall:  No acute finding. Extensive right coronary atherosclerotic calcification Hepatobiliary: Hepatic steatosis without focal finding.No evidence of biliary obstruction or stone. Pancreas: Heterogeneous from fatty atrophy. Spleen: Unremarkable. Adrenals/Urinary Tract: Negative adrenals. No hydronephrosis or stone. The right renal lesion described on 10/26/2014 MRI is not well seen. Unremarkable bladder. Reproductive:No pathologic findings. Stomach/Bowel: Extensive colonic diverticulosis. No other explanation for rectal bleeding. Sigmoidectomy reportedly for diverticulitis. No appendicitis. Vascular/Lymphatic: No acute vascular abnormality. There is chronic fat  infiltration at the root of the small bowel mesenteric without adenopathy or mass. Given stability this is likely incidental from remote inflammation. Well  marginated fatty structure no lower quadrant may be from remote fat necrosis. Finding separate from the left ovary and sigmoid colon. Peritoneal: No ascites or pneumoperitoneum. Musculoskeletal: No acute abnormalities. Advanced L4-5 facet degeneration with grade 1 anterolisthesis and canal stenosis. IMPRESSION: 1. No acute finding to explain abdominal pain. 2. Extensive colonic diverticulosis. No other imaging explanation for rectal bleeding. 3. Hepatic steatosis. Electronically Signed   By: Monte Fantasia M.D.   On: 05/07/2015 12:35     Labs:   Basic Metabolic Panel:  Recent Labs Lab 05/07/15 1054 05/08/15 0002  NA 140 139  K 3.7 3.4*  CL 102 106  CO2 24 26  GLUCOSE 138* 163*  BUN 24* 19  CREATININE 0.62 0.66  CALCIUM 9.0 8.3*   GFR Estimated Creatinine Clearance: 70.7 mL/min (by C-G formula based on Cr of 0.66). Liver Function Tests:  Recent Labs Lab 05/07/15 1054  AST 21  ALT 13*  ALKPHOS 55  BILITOT 0.7  PROT 6.4*  ALBUMIN 3.4*   Coagulation profile  Recent Labs Lab 05/07/15 1536  INR 1.12    CBC:  Recent Labs Lab 05/09/15 1820 05/10/15 0733 05/10/15 1954 05/11/15 0819 05/12/15 0728  WBC 9.1 8.1 10.3 9.1 8.8  HGB 9.1* 8.5* 8.9* 8.6* 8.3*  HCT 27.7* 25.9* 27.4* 26.6* 26.2*  MCV 84.5 84.9 86.4 87.5 87.0  PLT 278 262 308 299 329   CBG:  Recent Labs Lab 05/12/15 1152 05/12/15 1706 05/12/15 2204 05/13/15 0802 05/13/15 1159  GLUCAP 158* 125* 206* 137* 149*    Discharge Instructions:   Discharge Instructions    Call MD for:  extreme fatigue    Complete by:  As directed      Call MD for:  persistant dizziness or light-headedness    Complete by:  As directed      Call MD for:    Complete by:  As directed   Recurrent blood in the stools.     Diet - low sodium heart healthy    Complete by:  As directed      Diet Carb Modified    Complete by:  As directed      Increase activity slowly    Complete by:  As directed               Medication List    STOP taking these medications        aspirin EC 81 MG tablet     GAVILYTE-N WITH FLAVOR PACK 420 g solution  Generic drug:  polyethylene glycol-electrolytes     LORazepam 1 MG tablet  Commonly known as:  ATIVAN     meloxicam 7.5 MG tablet  Commonly known as:  MOBIC     nystatin-triamcinolone ointment  Commonly known as:  MYCOLOG      TAKE these medications        acetaminophen 500 MG tablet  Commonly known as:  TYLENOL  Take 1,000 mg by mouth every 6 (six) hours as needed for headache.     ALPRAZolam 0.5 MG tablet  Commonly known as:  XANAX  Take 0.5 mg by mouth 2 (two) times daily as needed for anxiety.     amLODipine 10 MG tablet  Commonly known as:  NORVASC  Take 10 mg by mouth every morning.     busPIRone 15 MG tablet  Commonly known as:  BUSPAR  Take 15 mg by mouth 2 (two) times daily.     diphenhydrAMINE 25 mg capsule  Commonly known as:  BENADRYL  Take 1 capsule (25 mg total) by mouth every 6 (six) hours as needed.     DULoxetine 60 MG capsule  Commonly known as:  CYMBALTA  Take 60 mg by mouth 2 (two) times daily.     KLOR-CON SPRINKLE 8 MEQ Cpcr capsule CR  Generic drug:  Potassium Chloride CR  Take 1 capsule by mouth 2 (two) times daily.     Liraglutide 18 MG/3ML Sopn  Inject 1.8 mg into the skin daily.     losartan-hydrochlorothiazide 100-25 MG tablet  Commonly known as:  HYZAAR  Take 1 tablet by mouth daily at 12 noon.     metFORMIN 1000 MG tablet  Commonly known as:  GLUCOPHAGE  Take 1,000 mg by mouth 2 (two) times daily with a meal.     mometasone 50 MCG/ACT nasal spray  Commonly known as:  NASONEX  Place 2 sprays into the nose daily as needed (for allergies).     mometasone-formoterol 100-5 MCG/ACT Aero  Commonly known as:  DULERA  Inhale 2 puffs into the lungs 2 (two) times daily.     ONETOUCH DELICA LANCETS FINE Misc  Use to check blood sugars once a day     oxyCODONE-acetaminophen 5-325 MG tablet  Commonly  known as:  PERCOCET/ROXICET  Take 1 tablet by mouth every 6 (six) hours as needed for moderate pain.     pioglitazone 45 MG tablet  Commonly known as:  ACTOS  Take 22.5 mg by mouth daily.     polyethylene glycol packet  Commonly known as:  MIRALAX / GLYCOLAX  Take 17 g by mouth daily.     vitamin B-12 500 MCG tablet  Commonly known as:  CYANOCOBALAMIN  Take 500 mcg by mouth daily.           Follow-up Information    Follow up with Wenda Low, MD On 05/15/2015.   Specialty:  Internal Medicine   Why:  Repeat blood work.   Appointment with Dr. Lysle Rubens is on 05/15/15 at 4pm   Contact information:   [address] [place] [address] [phone]       Follow up with Wenda Low, MD. Go on 05/21/2015.   Specialty:  Internal Medicine   Why:  for repeat CBC   Contact information:   [address] [place] [address] [place] [address] [phone]        Time coordinating discharge: 35 minutes.  Signed:  RAMA,CHRISTINA  Pager [phone] [facility] 05/13/2015, 1:39 PM

## 2015-05-13 NOTE — Progress Notes (Signed)
Pt.noticed she has swelling on her ankles & feet.NP Rogue Bussing was made aware.& said to notify MD in the morning .Will endorse to the nurse this am.

## 2015-05-21 DIAGNOSIS — K5730 Diverticulosis of large intestine without perforation or abscess without bleeding: Secondary | ICD-10-CM | POA: Diagnosis not present

## 2015-05-21 DIAGNOSIS — K922 Gastrointestinal hemorrhage, unspecified: Secondary | ICD-10-CM | POA: Diagnosis not present

## 2015-06-01 ENCOUNTER — Emergency Department (HOSPITAL_COMMUNITY): Payer: Medicare Other

## 2015-06-01 ENCOUNTER — Emergency Department (HOSPITAL_COMMUNITY)
Admission: EM | Admit: 2015-06-01 | Discharge: 2015-06-01 | Disposition: A | Discharge status: Home / Self Care | Payer: Medicare Other | Attending: Emergency Medicine | Admitting: Emergency Medicine

## 2015-06-01 ENCOUNTER — Encounter (HOSPITAL_COMMUNITY): Payer: Self-pay | Admitting: Emergency Medicine

## 2015-06-01 DIAGNOSIS — I10 Essential (primary) hypertension: Secondary | ICD-10-CM | POA: Insufficient documentation

## 2015-06-01 DIAGNOSIS — J45909 Unspecified asthma, uncomplicated: Secondary | ICD-10-CM | POA: Insufficient documentation

## 2015-06-01 DIAGNOSIS — F329 Major depressive disorder, single episode, unspecified: Secondary | ICD-10-CM | POA: Diagnosis not present

## 2015-06-01 DIAGNOSIS — Z880 Allergy status to penicillin: Secondary | ICD-10-CM | POA: Insufficient documentation

## 2015-06-01 DIAGNOSIS — M19011 Primary osteoarthritis, right shoulder: Secondary | ICD-10-CM | POA: Diagnosis not present

## 2015-06-01 DIAGNOSIS — F17210 Nicotine dependence, cigarettes, uncomplicated: Secondary | ICD-10-CM | POA: Insufficient documentation

## 2015-06-01 DIAGNOSIS — E119 Type 2 diabetes mellitus without complications: Secondary | ICD-10-CM | POA: Insufficient documentation

## 2015-06-01 DIAGNOSIS — Z79899 Other long term (current) drug therapy: Secondary | ICD-10-CM | POA: Insufficient documentation

## 2015-06-01 DIAGNOSIS — M25511 Pain in right shoulder: Secondary | ICD-10-CM | POA: Diagnosis present

## 2015-06-01 MED ORDER — OXYCODONE-ACETAMINOPHEN 5-325 MG PO TABS: 1.0000 | ORAL_TABLET | Freq: Four times a day (QID) | ORAL | Status: DC | PRN
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
## 2015-06-01 NOTE — ED Notes (Signed)
Pt. reports right shoulder pain and left neck pain onset this week , denies injury or fall , pain increases with movement /changing positions .
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
## 2015-06-04 DIAGNOSIS — M19011 Primary osteoarthritis, right shoulder: Secondary | ICD-10-CM | POA: Diagnosis not present

## 2015-06-04 DIAGNOSIS — M19042 Primary osteoarthritis, left hand: Secondary | ICD-10-CM | POA: Diagnosis not present

## 2015-06-04 DIAGNOSIS — M19041 Primary osteoarthritis, right hand: Secondary | ICD-10-CM | POA: Diagnosis not present

## 2015-06-05 DIAGNOSIS — F331 Major depressive disorder, recurrent, moderate: Secondary | ICD-10-CM | POA: Diagnosis not present

## 2015-07-10 DIAGNOSIS — I700 Atherosclerosis of aorta: Secondary | ICD-10-CM | POA: Diagnosis not present

## 2015-07-10 DIAGNOSIS — Z716 Tobacco abuse counseling: Secondary | ICD-10-CM | POA: Diagnosis not present

## 2015-07-10 DIAGNOSIS — F17210 Nicotine dependence, cigarettes, uncomplicated: Secondary | ICD-10-CM | POA: Diagnosis not present

## 2015-07-10 DIAGNOSIS — E1140 Type 2 diabetes mellitus with diabetic neuropathy, unspecified: Secondary | ICD-10-CM | POA: Diagnosis not present

## 2015-07-10 DIAGNOSIS — F5104 Psychophysiologic insomnia: Secondary | ICD-10-CM | POA: Diagnosis not present

## 2015-07-10 DIAGNOSIS — J449 Chronic obstructive pulmonary disease, unspecified: Secondary | ICD-10-CM | POA: Diagnosis not present

## 2015-07-10 DIAGNOSIS — Z794 Long term (current) use of insulin: Secondary | ICD-10-CM | POA: Diagnosis not present

## 2015-07-10 DIAGNOSIS — Z1389 Encounter for screening for other disorder: Secondary | ICD-10-CM | POA: Diagnosis not present

## 2015-07-10 DIAGNOSIS — M5116 Intervertebral disc disorders with radiculopathy, lumbar region: Secondary | ICD-10-CM | POA: Diagnosis not present

## 2015-07-10 DIAGNOSIS — F324 Major depressive disorder, single episode, in partial remission: Secondary | ICD-10-CM | POA: Diagnosis not present

## 2015-07-10 DIAGNOSIS — I10 Essential (primary) hypertension: Secondary | ICD-10-CM | POA: Diagnosis not present

## 2015-07-10 DIAGNOSIS — Z0000 Encounter for general adult medical examination without abnormal findings: Secondary | ICD-10-CM | POA: Diagnosis not present

## 2015-07-10 DIAGNOSIS — M5136 Other intervertebral disc degeneration, lumbar region: Secondary | ICD-10-CM | POA: Diagnosis not present

## 2015-07-10 DIAGNOSIS — Z7189 Other specified counseling: Secondary | ICD-10-CM | POA: Diagnosis not present

## 2015-07-31 DIAGNOSIS — F331 Major depressive disorder, recurrent, moderate: Secondary | ICD-10-CM | POA: Diagnosis not present

## 2015-08-16 DIAGNOSIS — M2550 Pain in unspecified joint: Secondary | ICD-10-CM | POA: Diagnosis not present

## 2015-08-19 DIAGNOSIS — M1711 Unilateral primary osteoarthritis, right knee: Secondary | ICD-10-CM | POA: Diagnosis not present

## 2015-09-11 DIAGNOSIS — F331 Major depressive disorder, recurrent, moderate: Secondary | ICD-10-CM | POA: Diagnosis not present

## 2015-09-28 ENCOUNTER — Encounter (HOSPITAL_COMMUNITY): Payer: Self-pay | Admitting: *Deleted

## 2015-09-28 ENCOUNTER — Emergency Department (HOSPITAL_COMMUNITY): Payer: Medicare Other

## 2015-09-28 ENCOUNTER — Emergency Department (HOSPITAL_COMMUNITY)
Admission: EM | Admit: 2015-09-28 | Discharge: 2015-09-28 | Disposition: A | Discharge status: Home / Self Care | Payer: Medicare Other | Attending: Emergency Medicine | Admitting: Emergency Medicine

## 2015-09-28 DIAGNOSIS — I10 Essential (primary) hypertension: Secondary | ICD-10-CM | POA: Diagnosis not present

## 2015-09-28 DIAGNOSIS — E119 Type 2 diabetes mellitus without complications: Secondary | ICD-10-CM | POA: Diagnosis not present

## 2015-09-28 DIAGNOSIS — R2232 Localized swelling, mass and lump, left upper limb: Secondary | ICD-10-CM | POA: Diagnosis not present

## 2015-09-28 DIAGNOSIS — Z79899 Other long term (current) drug therapy: Secondary | ICD-10-CM | POA: Diagnosis not present

## 2015-09-28 DIAGNOSIS — F17210 Nicotine dependence, cigarettes, uncomplicated: Secondary | ICD-10-CM | POA: Insufficient documentation

## 2015-09-28 DIAGNOSIS — Z7984 Long term (current) use of oral hypoglycemic drugs: Secondary | ICD-10-CM | POA: Insufficient documentation

## 2015-09-28 DIAGNOSIS — M7989 Other specified soft tissue disorders: Secondary | ICD-10-CM

## 2015-09-28 DIAGNOSIS — J449 Chronic obstructive pulmonary disease, unspecified: Secondary | ICD-10-CM | POA: Diagnosis not present

## 2015-09-28 DIAGNOSIS — M79642 Pain in left hand: Secondary | ICD-10-CM | POA: Diagnosis not present

## 2015-09-28 LAB — CBC WITH DIFFERENTIAL/PLATELET
BASOS ABS: 0 10*3/uL (ref 0.0–0.1)
BASOS PCT: 0 %
EOS PCT: 0 %
Eosinophils Absolute: 0 10*3/uL (ref 0.0–0.7)
HCT: 37.5 % (ref 36.0–46.0)
Hemoglobin: 11.8 g/dL — ABNORMAL LOW (ref 12.0–15.0)
Lymphocytes Relative: 16 %
Lymphs Abs: 1.9 10*3/uL (ref 0.7–4.0)
MCH: 25.8 pg — ABNORMAL LOW (ref 26.0–34.0)
MCHC: 31.5 g/dL (ref 30.0–36.0)
MCV: 82.1 fL (ref 78.0–100.0)
MONO ABS: 0.6 10*3/uL (ref 0.1–1.0)
Monocytes Relative: 5 %
Neutro Abs: 9.8 10*3/uL — ABNORMAL HIGH (ref 1.7–7.7)
Neutrophils Relative %: 79 %
PLATELETS: 454 10*3/uL — AB (ref 150–400)
RBC: 4.57 MIL/uL (ref 3.87–5.11)
RDW: 18.4 % — AB (ref 11.5–15.5)
WBC: 12.3 10*3/uL — ABNORMAL HIGH (ref 4.0–10.5)

## 2015-09-28 LAB — BASIC METABOLIC PANEL
Anion gap: 14 (ref 5–15)
BUN: 20 mg/dL (ref 6–20)
CALCIUM: 9.7 mg/dL (ref 8.9–10.3)
CO2: 23 mmol/L (ref 22–32)
Chloride: 103 mmol/L (ref 101–111)
Creatinine, Ser: 0.81 mg/dL (ref 0.44–1.00)
Glucose, Bld: 269 mg/dL — ABNORMAL HIGH (ref 65–99)
Potassium: 3.2 mmol/L — ABNORMAL LOW (ref 3.5–5.1)
SODIUM: 140 mmol/L (ref 135–145)

## 2015-09-28 MED ORDER — VANCOMYCIN HCL IN DEXTROSE 1-5 GM/200ML-% IV SOLN
1000.0000 mg | Freq: Once | INTRAVENOUS | Status: AC
  Administered 2015-09-28: 1000 mg via INTRAVENOUS
  Filled 2015-09-28: qty 200

## 2015-09-28 MED ORDER — OXYCODONE HCL 5 MG PO TABS
10.0000 mg | ORAL_TABLET | Freq: Once | ORAL | Status: AC
  Administered 2015-09-28: 10 mg via ORAL
  Filled 2015-09-28: qty 2

## 2015-09-28 MED ORDER — DIPHENHYDRAMINE HCL 50 MG/ML IJ SOLN
25.0000 mg | Freq: Once | INTRAMUSCULAR | Status: AC
  Administered 2015-09-28: 25 mg via INTRAVENOUS
  Filled 2015-09-28: qty 1

## 2015-09-28 MED ORDER — SODIUM CHLORIDE 0.9 % IV SOLN
Freq: Once | INTRAVENOUS | Status: AC
  Administered 2015-09-28: 20:00:00 via INTRAVENOUS

## 2015-09-28 NOTE — Discharge Instructions (Signed)
The hand above the level of the heart is much as possible.  He is return to the emergency room in 24 hours for recheck.  If you develop worsening pain, fever, nausea, vomiting, streaks going up the arm or any new or concerning symptoms return immediately for reevaluation.

## 2015-09-28 NOTE — ED Triage Notes (Signed)
Pt states she woke up with left hand pain and swelling. Pt tried ice with no relief. Pt denies any injury to left hand.
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
## 2015-09-29 ENCOUNTER — Emergency Department (HOSPITAL_COMMUNITY)
Admission: EM | Admit: 2015-09-29 | Discharge: 2015-09-29 | Disposition: A | Discharge status: Home / Self Care | Payer: Medicare Other | Attending: Emergency Medicine | Admitting: Emergency Medicine

## 2015-09-29 ENCOUNTER — Encounter (HOSPITAL_COMMUNITY): Payer: Self-pay | Admitting: Vascular Surgery

## 2015-09-29 DIAGNOSIS — Z7984 Long term (current) use of oral hypoglycemic drugs: Secondary | ICD-10-CM | POA: Insufficient documentation

## 2015-09-29 DIAGNOSIS — Y999 Unspecified external cause status: Secondary | ICD-10-CM | POA: Insufficient documentation

## 2015-09-29 DIAGNOSIS — S60562A Insect bite (nonvenomous) of left hand, initial encounter: Secondary | ICD-10-CM | POA: Diagnosis not present

## 2015-09-29 DIAGNOSIS — W57XXXA Bitten or stung by nonvenomous insect and other nonvenomous arthropods, initial encounter: Secondary | ICD-10-CM | POA: Insufficient documentation

## 2015-09-29 DIAGNOSIS — J449 Chronic obstructive pulmonary disease, unspecified: Secondary | ICD-10-CM | POA: Insufficient documentation

## 2015-09-29 DIAGNOSIS — I10 Essential (primary) hypertension: Secondary | ICD-10-CM | POA: Diagnosis not present

## 2015-09-29 DIAGNOSIS — E119 Type 2 diabetes mellitus without complications: Secondary | ICD-10-CM | POA: Insufficient documentation

## 2015-09-29 DIAGNOSIS — Z79899 Other long term (current) drug therapy: Secondary | ICD-10-CM | POA: Insufficient documentation

## 2015-09-29 DIAGNOSIS — Y929 Unspecified place or not applicable: Secondary | ICD-10-CM | POA: Diagnosis not present

## 2015-09-29 DIAGNOSIS — Y939 Activity, unspecified: Secondary | ICD-10-CM | POA: Insufficient documentation

## 2015-09-29 DIAGNOSIS — F17210 Nicotine dependence, cigarettes, uncomplicated: Secondary | ICD-10-CM | POA: Diagnosis not present
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
## 2015-09-29 NOTE — Discharge Instructions (Signed)
We believe that your redness is due to a local reaction to a bug bite. You may take Tylenol as needed for soreness. Continue to elevate your hand above the level of your heart. You may also take 1 tablet of Benadryl every 6-8 hours as needed to try and limit discomfort and swelling. If you notice significant worsening of your symptoms such as significant change in swelling, worsening redness, red line streaking up your arm, or a fever over 100.80F, return to the emergency department for evaluation. Otherwise, follow up with your primary care doctor for a recheck of your symptoms.

## 2015-09-29 NOTE — ED Triage Notes (Signed)
Pt reports to the ED for eval of recheck. Pt reports she was told to come here in 24 hours to let someone look at her hand and make sure it is not getting worse. Was seen for hand swelling and pain. States he symptoms are getting better but she still has some residual swelling and erythema.

## 2015-10-04 ENCOUNTER — Encounter (HOSPITAL_COMMUNITY): Payer: Self-pay

## 2015-10-04 ENCOUNTER — Emergency Department (HOSPITAL_COMMUNITY)
Admission: EM | Admit: 2015-10-04 | Discharge: 2015-10-05 | Disposition: A | Discharge status: Home / Self Care | Payer: No Typology Code available for payment source | Attending: Emergency Medicine | Admitting: Emergency Medicine

## 2015-10-04 DIAGNOSIS — F17210 Nicotine dependence, cigarettes, uncomplicated: Secondary | ICD-10-CM | POA: Insufficient documentation

## 2015-10-04 DIAGNOSIS — R0789 Other chest pain: Secondary | ICD-10-CM | POA: Diagnosis not present

## 2015-10-04 DIAGNOSIS — Y939 Activity, unspecified: Secondary | ICD-10-CM | POA: Insufficient documentation

## 2015-10-04 DIAGNOSIS — Y999 Unspecified external cause status: Secondary | ICD-10-CM | POA: Diagnosis not present

## 2015-10-04 DIAGNOSIS — J449 Chronic obstructive pulmonary disease, unspecified: Secondary | ICD-10-CM | POA: Insufficient documentation

## 2015-10-04 DIAGNOSIS — I10 Essential (primary) hypertension: Secondary | ICD-10-CM | POA: Insufficient documentation

## 2015-10-04 DIAGNOSIS — Z7984 Long term (current) use of oral hypoglycemic drugs: Secondary | ICD-10-CM | POA: Diagnosis not present

## 2015-10-04 DIAGNOSIS — M549 Dorsalgia, unspecified: Secondary | ICD-10-CM | POA: Diagnosis not present

## 2015-10-04 DIAGNOSIS — M25511 Pain in right shoulder: Secondary | ICD-10-CM | POA: Diagnosis not present

## 2015-10-04 DIAGNOSIS — M25512 Pain in left shoulder: Secondary | ICD-10-CM | POA: Diagnosis not present

## 2015-10-04 DIAGNOSIS — E119 Type 2 diabetes mellitus without complications: Secondary | ICD-10-CM | POA: Insufficient documentation

## 2015-10-04 DIAGNOSIS — S098XXA Other specified injuries of head, initial encounter: Secondary | ICD-10-CM | POA: Diagnosis not present

## 2015-10-04 DIAGNOSIS — Y92410 Unspecified street and highway as the place of occurrence of the external cause: Secondary | ICD-10-CM | POA: Diagnosis not present

## 2015-10-04 DIAGNOSIS — G4489 Other headache syndrome: Secondary | ICD-10-CM | POA: Diagnosis not present

## 2015-10-04 MED ORDER — ACETAMINOPHEN 325 MG PO TABS
650.0000 mg | ORAL_TABLET | Freq: Once | ORAL | Status: AC
  Administered 2015-10-04: 650 mg via ORAL

## 2015-10-04 MED ORDER — IBUPROFEN 400 MG PO TABS: 600.0000 mg | ORAL_TABLET | Freq: Once | ORAL | Status: DC | PRN

## 2015-10-04 MED ORDER — OXYCODONE-ACETAMINOPHEN 5-325 MG PO TABS
1.0000 | ORAL_TABLET | Freq: Once | ORAL | Status: AC
  Administered 2015-10-04: 1 via ORAL
  Filled 2015-10-04: qty 1

## 2015-10-04 MED ORDER — ACETAMINOPHEN 325 MG PO TABS
ORAL_TABLET | ORAL | Status: AC
  Filled 2015-10-04: qty 2

## 2015-10-04 NOTE — ED Triage Notes (Signed)
Pt requesting tylenol in lobby, triage RN made aware.
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
## 2015-10-04 NOTE — ED Notes (Signed)
Pt c/o pain to lower posterior head.
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
## 2015-10-05 DIAGNOSIS — M549 Dorsalgia, unspecified: Secondary | ICD-10-CM | POA: Diagnosis not present

## 2015-10-05 MED ORDER — CYCLOBENZAPRINE HCL 10 MG PO TABS: 10.0000 mg | ORAL_TABLET | Freq: Two times a day (BID) | ORAL | 0 refills | Status: DC | PRN

## 2015-10-10 DIAGNOSIS — S139XXA Sprain of joints and ligaments of unspecified parts of neck, initial encounter: Secondary | ICD-10-CM | POA: Diagnosis not present

## 2015-10-10 DIAGNOSIS — S335XXA Sprain of ligaments of lumbar spine, initial encounter: Secondary | ICD-10-CM | POA: Diagnosis not present

## 2015-10-10 DIAGNOSIS — M1711 Unilateral primary osteoarthritis, right knee: Secondary | ICD-10-CM | POA: Diagnosis not present

## 2015-10-15 DIAGNOSIS — M542 Cervicalgia: Secondary | ICD-10-CM | POA: Diagnosis not present

## 2015-10-15 DIAGNOSIS — M545 Low back pain: Secondary | ICD-10-CM | POA: Diagnosis not present

## 2015-10-15 DIAGNOSIS — S39012D Strain of muscle, fascia and tendon of lower back, subsequent encounter: Secondary | ICD-10-CM | POA: Diagnosis not present

## 2015-10-15 DIAGNOSIS — S161XXD Strain of muscle, fascia and tendon at neck level, subsequent encounter: Secondary | ICD-10-CM | POA: Diagnosis not present

## 2015-10-17 DIAGNOSIS — M545 Low back pain: Secondary | ICD-10-CM | POA: Diagnosis not present

## 2015-10-17 DIAGNOSIS — S161XXD Strain of muscle, fascia and tendon at neck level, subsequent encounter: Secondary | ICD-10-CM | POA: Diagnosis not present

## 2015-10-17 DIAGNOSIS — M542 Cervicalgia: Secondary | ICD-10-CM | POA: Diagnosis not present

## 2015-10-17 DIAGNOSIS — S39012D Strain of muscle, fascia and tendon of lower back, subsequent encounter: Secondary | ICD-10-CM | POA: Diagnosis not present

## 2015-10-22 DIAGNOSIS — S161XXD Strain of muscle, fascia and tendon at neck level, subsequent encounter: Secondary | ICD-10-CM | POA: Diagnosis not present

## 2015-10-22 DIAGNOSIS — M542 Cervicalgia: Secondary | ICD-10-CM | POA: Diagnosis not present

## 2015-10-22 DIAGNOSIS — M545 Low back pain: Secondary | ICD-10-CM | POA: Diagnosis not present

## 2015-10-22 DIAGNOSIS — S39012D Strain of muscle, fascia and tendon of lower back, subsequent encounter: Secondary | ICD-10-CM | POA: Diagnosis not present

## 2015-10-24 DIAGNOSIS — M542 Cervicalgia: Secondary | ICD-10-CM | POA: Diagnosis not present

## 2015-10-24 DIAGNOSIS — M545 Low back pain: Secondary | ICD-10-CM | POA: Diagnosis not present

## 2015-10-24 DIAGNOSIS — S161XXD Strain of muscle, fascia and tendon at neck level, subsequent encounter: Secondary | ICD-10-CM | POA: Diagnosis not present

## 2015-10-24 DIAGNOSIS — S39012D Strain of muscle, fascia and tendon of lower back, subsequent encounter: Secondary | ICD-10-CM | POA: Diagnosis not present

## 2015-10-29 DIAGNOSIS — S161XXD Strain of muscle, fascia and tendon at neck level, subsequent encounter: Secondary | ICD-10-CM | POA: Diagnosis not present

## 2015-10-29 DIAGNOSIS — M545 Low back pain: Secondary | ICD-10-CM | POA: Diagnosis not present

## 2015-10-29 DIAGNOSIS — M542 Cervicalgia: Secondary | ICD-10-CM | POA: Diagnosis not present

## 2015-10-29 DIAGNOSIS — S39012D Strain of muscle, fascia and tendon of lower back, subsequent encounter: Secondary | ICD-10-CM | POA: Diagnosis not present

## 2015-11-03 ENCOUNTER — Emergency Department (HOSPITAL_COMMUNITY): Payer: No Typology Code available for payment source

## 2015-11-03 ENCOUNTER — Encounter (HOSPITAL_COMMUNITY): Payer: Self-pay

## 2015-11-03 ENCOUNTER — Emergency Department (HOSPITAL_COMMUNITY)
Admission: EM | Admit: 2015-11-03 | Discharge: 2015-11-03 | Disposition: A | Discharge status: Home / Self Care | Payer: No Typology Code available for payment source | Attending: Emergency Medicine | Admitting: Emergency Medicine

## 2015-11-03 DIAGNOSIS — Y999 Unspecified external cause status: Secondary | ICD-10-CM | POA: Diagnosis not present

## 2015-11-03 DIAGNOSIS — J45909 Unspecified asthma, uncomplicated: Secondary | ICD-10-CM | POA: Insufficient documentation

## 2015-11-03 DIAGNOSIS — M25511 Pain in right shoulder: Secondary | ICD-10-CM

## 2015-11-03 DIAGNOSIS — E119 Type 2 diabetes mellitus without complications: Secondary | ICD-10-CM | POA: Diagnosis not present

## 2015-11-03 DIAGNOSIS — Z7984 Long term (current) use of oral hypoglycemic drugs: Secondary | ICD-10-CM | POA: Diagnosis not present

## 2015-11-03 DIAGNOSIS — Y939 Activity, unspecified: Secondary | ICD-10-CM | POA: Insufficient documentation

## 2015-11-03 DIAGNOSIS — I10 Essential (primary) hypertension: Secondary | ICD-10-CM | POA: Diagnosis not present

## 2015-11-03 DIAGNOSIS — F17210 Nicotine dependence, cigarettes, uncomplicated: Secondary | ICD-10-CM | POA: Diagnosis not present

## 2015-11-03 DIAGNOSIS — Y92410 Unspecified street and highway as the place of occurrence of the external cause: Secondary | ICD-10-CM | POA: Diagnosis not present

## 2015-11-03 MED ORDER — METHOCARBAMOL 500 MG PO TABS: 500.0000 mg | ORAL_TABLET | Freq: Two times a day (BID) | ORAL | 0 refills | Status: DC

## 2015-11-03 NOTE — Discharge Instructions (Signed)
Take your medication as prescribed as needed for pain relief. I recommend continuing to take your prescription of oxycodone as prescribed for additional pain relief. You may also apply ice to affected area for 15 minutes 3-4 times daily for pain relief.  If her symptoms do not improve over the next week I recommend calling the orthopedic clinic listed above to schedule a follow-up appointment for further evaluation and management of your shoulder pain. Please return to the Emergency Department if symptoms worsen or new onset of fever, redness, swelling, warmth, numbness, tingling, weakness, chest pain, difficulty breathing, back pain.
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
## 2015-11-03 NOTE — ED Notes (Signed)
Declined W/C at D/C and was escorted to lobby by RN. 

## 2015-11-05 DIAGNOSIS — M542 Cervicalgia: Secondary | ICD-10-CM | POA: Diagnosis not present

## 2015-11-05 DIAGNOSIS — M545 Low back pain: Secondary | ICD-10-CM | POA: Diagnosis not present

## 2015-11-05 DIAGNOSIS — S161XXD Strain of muscle, fascia and tendon at neck level, subsequent encounter: Secondary | ICD-10-CM | POA: Diagnosis not present

## 2015-11-05 DIAGNOSIS — S39012D Strain of muscle, fascia and tendon of lower back, subsequent encounter: Secondary | ICD-10-CM | POA: Diagnosis not present

## 2015-11-12 DIAGNOSIS — S161XXD Strain of muscle, fascia and tendon at neck level, subsequent encounter: Secondary | ICD-10-CM | POA: Diagnosis not present

## 2015-11-12 DIAGNOSIS — M542 Cervicalgia: Secondary | ICD-10-CM | POA: Diagnosis not present

## 2015-11-12 DIAGNOSIS — S39012D Strain of muscle, fascia and tendon of lower back, subsequent encounter: Secondary | ICD-10-CM | POA: Diagnosis not present

## 2015-11-12 DIAGNOSIS — M545 Low back pain: Secondary | ICD-10-CM | POA: Diagnosis not present

## 2015-11-13 DIAGNOSIS — Z23 Encounter for immunization: Secondary | ICD-10-CM | POA: Diagnosis not present

## 2015-11-13 DIAGNOSIS — J449 Chronic obstructive pulmonary disease, unspecified: Secondary | ICD-10-CM | POA: Diagnosis not present

## 2015-11-13 DIAGNOSIS — M25542 Pain in joints of left hand: Secondary | ICD-10-CM | POA: Diagnosis not present

## 2015-11-13 DIAGNOSIS — Z7984 Long term (current) use of oral hypoglycemic drugs: Secondary | ICD-10-CM | POA: Diagnosis not present

## 2015-11-13 DIAGNOSIS — J45909 Unspecified asthma, uncomplicated: Secondary | ICD-10-CM | POA: Diagnosis not present

## 2015-11-13 DIAGNOSIS — E119 Type 2 diabetes mellitus without complications: Secondary | ICD-10-CM | POA: Diagnosis not present

## 2015-11-13 DIAGNOSIS — F324 Major depressive disorder, single episode, in partial remission: Secondary | ICD-10-CM | POA: Diagnosis not present

## 2015-11-13 DIAGNOSIS — R768 Other specified abnormal immunological findings in serum: Secondary | ICD-10-CM | POA: Diagnosis not present

## 2015-11-13 DIAGNOSIS — I10 Essential (primary) hypertension: Secondary | ICD-10-CM | POA: Diagnosis not present

## 2015-11-13 DIAGNOSIS — M25541 Pain in joints of right hand: Secondary | ICD-10-CM | POA: Diagnosis not present

## 2015-11-14 DIAGNOSIS — S39012D Strain of muscle, fascia and tendon of lower back, subsequent encounter: Secondary | ICD-10-CM | POA: Diagnosis not present

## 2015-11-14 DIAGNOSIS — M542 Cervicalgia: Secondary | ICD-10-CM | POA: Diagnosis not present

## 2015-11-14 DIAGNOSIS — S161XXD Strain of muscle, fascia and tendon at neck level, subsequent encounter: Secondary | ICD-10-CM | POA: Diagnosis not present

## 2015-11-14 DIAGNOSIS — M545 Low back pain: Secondary | ICD-10-CM | POA: Diagnosis not present

## 2015-11-20 DIAGNOSIS — F331 Major depressive disorder, recurrent, moderate: Secondary | ICD-10-CM | POA: Diagnosis not present

## 2015-11-22 DIAGNOSIS — M2550 Pain in unspecified joint: Secondary | ICD-10-CM | POA: Diagnosis not present

## 2015-11-22 DIAGNOSIS — M25559 Pain in unspecified hip: Secondary | ICD-10-CM | POA: Diagnosis not present

## 2015-11-25 DIAGNOSIS — M7061 Trochanteric bursitis, right hip: Secondary | ICD-10-CM | POA: Diagnosis not present

## 2015-12-05 DIAGNOSIS — M542 Cervicalgia: Secondary | ICD-10-CM | POA: Diagnosis not present

## 2015-12-05 DIAGNOSIS — S39012D Strain of muscle, fascia and tendon of lower back, subsequent encounter: Secondary | ICD-10-CM | POA: Diagnosis not present

## 2015-12-05 DIAGNOSIS — S161XXD Strain of muscle, fascia and tendon at neck level, subsequent encounter: Secondary | ICD-10-CM | POA: Diagnosis not present

## 2015-12-05 DIAGNOSIS — M545 Low back pain: Secondary | ICD-10-CM | POA: Diagnosis not present

## 2015-12-10 DIAGNOSIS — M545 Low back pain: Secondary | ICD-10-CM | POA: Diagnosis not present

## 2015-12-10 DIAGNOSIS — M542 Cervicalgia: Secondary | ICD-10-CM | POA: Diagnosis not present

## 2015-12-10 DIAGNOSIS — S39012D Strain of muscle, fascia and tendon of lower back, subsequent encounter: Secondary | ICD-10-CM | POA: Diagnosis not present

## 2015-12-10 DIAGNOSIS — S161XXD Strain of muscle, fascia and tendon at neck level, subsequent encounter: Secondary | ICD-10-CM | POA: Diagnosis not present

## 2015-12-16 DIAGNOSIS — F331 Major depressive disorder, recurrent, moderate: Secondary | ICD-10-CM | POA: Diagnosis not present

## 2015-12-17 DIAGNOSIS — S161XXD Strain of muscle, fascia and tendon at neck level, subsequent encounter: Secondary | ICD-10-CM | POA: Diagnosis not present

## 2015-12-17 DIAGNOSIS — M545 Low back pain: Secondary | ICD-10-CM | POA: Diagnosis not present

## 2015-12-17 DIAGNOSIS — S39012D Strain of muscle, fascia and tendon of lower back, subsequent encounter: Secondary | ICD-10-CM | POA: Diagnosis not present

## 2015-12-17 DIAGNOSIS — M542 Cervicalgia: Secondary | ICD-10-CM | POA: Diagnosis not present

## 2015-12-19 DIAGNOSIS — M542 Cervicalgia: Secondary | ICD-10-CM | POA: Diagnosis not present

## 2015-12-19 DIAGNOSIS — M545 Low back pain: Secondary | ICD-10-CM | POA: Diagnosis not present

## 2015-12-19 DIAGNOSIS — S39012D Strain of muscle, fascia and tendon of lower back, subsequent encounter: Secondary | ICD-10-CM | POA: Diagnosis not present

## 2015-12-19 DIAGNOSIS — S161XXD Strain of muscle, fascia and tendon at neck level, subsequent encounter: Secondary | ICD-10-CM | POA: Diagnosis not present

## 2015-12-23 DIAGNOSIS — L989 Disorder of the skin and subcutaneous tissue, unspecified: Secondary | ICD-10-CM | POA: Diagnosis not present

## 2015-12-23 DIAGNOSIS — J309 Allergic rhinitis, unspecified: Secondary | ICD-10-CM | POA: Diagnosis not present

## 2015-12-24 DIAGNOSIS — H01004 Unspecified blepharitis left upper eyelid: Secondary | ICD-10-CM | POA: Diagnosis not present

## 2015-12-24 DIAGNOSIS — H01002 Unspecified blepharitis right lower eyelid: Secondary | ICD-10-CM | POA: Diagnosis not present

## 2015-12-24 DIAGNOSIS — H01001 Unspecified blepharitis right upper eyelid: Secondary | ICD-10-CM | POA: Diagnosis not present

## 2015-12-24 DIAGNOSIS — H01005 Unspecified blepharitis left lower eyelid: Secondary | ICD-10-CM | POA: Diagnosis not present

## 2015-12-30 DIAGNOSIS — R700 Elevated erythrocyte sedimentation rate: Secondary | ICD-10-CM | POA: Diagnosis not present

## 2015-12-30 DIAGNOSIS — M7989 Other specified soft tissue disorders: Secondary | ICD-10-CM | POA: Diagnosis not present

## 2015-12-30 DIAGNOSIS — M2550 Pain in unspecified joint: Secondary | ICD-10-CM | POA: Diagnosis not present

## 2015-12-30 DIAGNOSIS — R768 Other specified abnormal immunological findings in serum: Secondary | ICD-10-CM | POA: Diagnosis not present

## 2015-12-30 DIAGNOSIS — Z6827 Body mass index (BMI) 27.0-27.9, adult: Secondary | ICD-10-CM | POA: Diagnosis not present

## 2015-12-30 DIAGNOSIS — E663 Overweight: Secondary | ICD-10-CM | POA: Diagnosis not present

## 2015-12-30 DIAGNOSIS — R5383 Other fatigue: Secondary | ICD-10-CM | POA: Diagnosis not present

## 2015-12-30 DIAGNOSIS — M150 Primary generalized (osteo)arthritis: Secondary | ICD-10-CM | POA: Diagnosis not present

## 2015-12-31 DIAGNOSIS — M545 Low back pain: Secondary | ICD-10-CM | POA: Diagnosis not present

## 2015-12-31 DIAGNOSIS — M542 Cervicalgia: Secondary | ICD-10-CM | POA: Diagnosis not present

## 2015-12-31 DIAGNOSIS — S39012D Strain of muscle, fascia and tendon of lower back, subsequent encounter: Secondary | ICD-10-CM | POA: Diagnosis not present

## 2015-12-31 DIAGNOSIS — S161XXD Strain of muscle, fascia and tendon at neck level, subsequent encounter: Secondary | ICD-10-CM | POA: Diagnosis not present

## 2016-01-03 DIAGNOSIS — M545 Low back pain: Secondary | ICD-10-CM | POA: Diagnosis not present

## 2016-01-03 DIAGNOSIS — M542 Cervicalgia: Secondary | ICD-10-CM | POA: Diagnosis not present

## 2016-01-03 DIAGNOSIS — S39012D Strain of muscle, fascia and tendon of lower back, subsequent encounter: Secondary | ICD-10-CM | POA: Diagnosis not present

## 2016-01-03 DIAGNOSIS — S161XXD Strain of muscle, fascia and tendon at neck level, subsequent encounter: Secondary | ICD-10-CM | POA: Diagnosis not present

## 2016-01-07 DIAGNOSIS — M542 Cervicalgia: Secondary | ICD-10-CM | POA: Diagnosis not present

## 2016-01-07 DIAGNOSIS — M545 Low back pain: Secondary | ICD-10-CM | POA: Diagnosis not present

## 2016-01-07 DIAGNOSIS — S39012D Strain of muscle, fascia and tendon of lower back, subsequent encounter: Secondary | ICD-10-CM | POA: Diagnosis not present

## 2016-01-07 DIAGNOSIS — S161XXD Strain of muscle, fascia and tendon at neck level, subsequent encounter: Secondary | ICD-10-CM | POA: Diagnosis not present

## 2016-01-27 DIAGNOSIS — F331 Major depressive disorder, recurrent, moderate: Secondary | ICD-10-CM | POA: Diagnosis not present

## 2016-02-03 DIAGNOSIS — K0889 Other specified disorders of teeth and supporting structures: Secondary | ICD-10-CM | POA: Diagnosis not present

## 2016-02-11 DIAGNOSIS — E663 Overweight: Secondary | ICD-10-CM | POA: Diagnosis not present

## 2016-02-11 DIAGNOSIS — M2550 Pain in unspecified joint: Secondary | ICD-10-CM | POA: Diagnosis not present

## 2016-02-11 DIAGNOSIS — M150 Primary generalized (osteo)arthritis: Secondary | ICD-10-CM | POA: Diagnosis not present

## 2016-02-11 DIAGNOSIS — Z6827 Body mass index (BMI) 27.0-27.9, adult: Secondary | ICD-10-CM | POA: Diagnosis not present

## 2016-02-11 DIAGNOSIS — M25561 Pain in right knee: Secondary | ICD-10-CM | POA: Diagnosis not present

## 2016-02-21 DIAGNOSIS — M1711 Unilateral primary osteoarthritis, right knee: Secondary | ICD-10-CM | POA: Diagnosis not present

## 2016-02-21 DIAGNOSIS — S335XXA Sprain of ligaments of lumbar spine, initial encounter: Secondary | ICD-10-CM | POA: Diagnosis not present

## 2016-02-21 DIAGNOSIS — S139XXA Sprain of joints and ligaments of unspecified parts of neck, initial encounter: Secondary | ICD-10-CM | POA: Diagnosis not present

## 2016-02-24 DIAGNOSIS — F331 Major depressive disorder, recurrent, moderate: Secondary | ICD-10-CM | POA: Diagnosis not present

## 2016-02-28 DIAGNOSIS — M1711 Unilateral primary osteoarthritis, right knee: Secondary | ICD-10-CM | POA: Diagnosis not present

## 2016-03-06 DIAGNOSIS — M1711 Unilateral primary osteoarthritis, right knee: Secondary | ICD-10-CM | POA: Diagnosis not present

## 2016-03-10 DIAGNOSIS — K047 Periapical abscess without sinus: Secondary | ICD-10-CM | POA: Diagnosis not present

## 2016-03-10 DIAGNOSIS — M179 Osteoarthritis of knee, unspecified: Secondary | ICD-10-CM | POA: Diagnosis not present

## 2016-03-13 DIAGNOSIS — M1711 Unilateral primary osteoarthritis, right knee: Secondary | ICD-10-CM | POA: Diagnosis not present

## 2016-03-18 ENCOUNTER — Ambulatory Visit (INDEPENDENT_AMBULATORY_CARE_PROVIDER_SITE_OTHER): Payer: Medicare Other | Admitting: Podiatry

## 2016-03-18 ENCOUNTER — Ambulatory Visit (INDEPENDENT_AMBULATORY_CARE_PROVIDER_SITE_OTHER): Payer: Medicare Other

## 2016-03-18 DIAGNOSIS — R600 Localized edema: Secondary | ICD-10-CM

## 2016-03-18 DIAGNOSIS — S82891A Other fracture of right lower leg, initial encounter for closed fracture: Secondary | ICD-10-CM

## 2016-03-18 DIAGNOSIS — M19071 Primary osteoarthritis, right ankle and foot: Secondary | ICD-10-CM | POA: Diagnosis not present

## 2016-03-18 DIAGNOSIS — R52 Pain, unspecified: Secondary | ICD-10-CM

## 2016-03-18 MED ORDER — OXYCODONE-ACETAMINOPHEN 10-325 MG PO TABS: 1.0000 | ORAL_TABLET | Freq: Three times a day (TID) | ORAL | 0 refills | Status: DC | PRN

## 2016-03-18 MED ORDER — OXYCODONE-ACETAMINOPHEN 5-325 MG PO TABS: 1.0000 | ORAL_TABLET | Freq: Three times a day (TID) | ORAL | 0 refills | Status: DC | PRN

## 2016-03-18 NOTE — Progress Notes (Signed)
   SUBJECTIVE Patient with a history of diabetes mellitus presents to office today complaining of elongated, thickened nails. Pain while ambulating in shoes. Patient is unable to trim their own nails. Patient also has a new complaint today for pain and swelling to the right rear foot medial aspect 8 days. Patient states that is very tender to palpation. She denies trauma. She also states that is very red and swollen. Patient does have a history of right knee pain which has been treated with anti-inflammatory injections and hyaluronic acid injections. Patient recently had her knee drained of excess fluid. Patient states that she is unable to walk due to the severe right foot pain  OBJECTIVE General Patient is awake, alert, and oriented x 3 and in no acute distress. Derm Skin is dry and supple bilateral. Negative open lesions or macerations. Remaining integument unremarkable. Nails are tender, long, thickened and dystrophic with subungual debris, consistent with onychomycosis, 1-5 bilateral. No signs of infection noted. Vasc  DP and PT pedal pulses palpable bilaterally. Temperature gradient within normal limits.  Neuro Epicritic and protective threshold sensation diminished bilaterally.  Musculoskeletal Exam edema with redness noted to the rear foot of the right lower extremity medial aspect area significant pain on palpation to the medial malleolus as well as posterior tibial tendon.  ASSESSMENT 1. Diabetes Mellitus w/ peripheral neuropathy 2. Onychomycosis of nail due to dermatophyte bilateral 3. Possible medial malleolus fracture 4. Possible posterior tibial tendon rupture 5. Redness and edema right medial ankle  PLAN OF CARE 1. Patient evaluated today. 2. Instructed to maintain good pedal hygiene and foot care. Stressed importance of controlling blood sugar.  3. Mechanical debridement of nails 1-5 bilaterally performed using a nail nipper. Filed with dremel without incident.  4.  Prescription for Percocet 5. Today a short cam boot was dispensed 6. Today orders were placed for an MRI right ankle to rule out possible posterior tibial tendon rupture or medial malleolar fracture. 7. Return to clinic in 3 weeks   Edrick Kins, DPM [facility]  Dr. Edrick Kins, [address]                                        [place], [place] [address]                Office [phone]  Fax [phone]

## 2016-03-19 ENCOUNTER — Telehealth: Payer: Self-pay | Admitting: *Deleted

## 2016-03-19 DIAGNOSIS — S82891A Other fracture of right lower leg, initial encounter for closed fracture: Secondary | ICD-10-CM

## 2016-03-19 DIAGNOSIS — M19071 Primary osteoarthritis, right ankle and foot: Secondary | ICD-10-CM

## 2016-03-19 DIAGNOSIS — R600 Localized edema: Secondary | ICD-10-CM

## 2016-03-19 NOTE — Telephone Encounter (Addendum)
-----   Message from Edrick Kins, DPM sent at 03/18/2016  2:26 PM EST ----- Regarding: MRI right ankle Please order MRI right ankle.  Dx: possible medial malleolus fx, possible PT tendon tear, medial heel pain  Thanks, Dr. Amalia Hailey. 03/19/2016-Faxed orders to [facility].

## 2016-03-23 DIAGNOSIS — F331 Major depressive disorder, recurrent, moderate: Secondary | ICD-10-CM | POA: Diagnosis not present

## 2016-03-26 DIAGNOSIS — M25562 Pain in left knee: Secondary | ICD-10-CM | POA: Diagnosis not present

## 2016-03-26 DIAGNOSIS — M1711 Unilateral primary osteoarthritis, right knee: Secondary | ICD-10-CM | POA: Diagnosis not present

## 2016-03-28 ENCOUNTER — Ambulatory Visit
Admission: RE | Admit: 2016-03-28 | Discharge: 2016-03-28 | Disposition: A | Discharge status: Home / Self Care | Payer: Medicare Other | Source: Ambulatory Visit | Attending: Podiatry | Admitting: Podiatry

## 2016-03-28 DIAGNOSIS — R600 Localized edema: Secondary | ICD-10-CM | POA: Diagnosis not present

## 2016-03-31 DIAGNOSIS — R109 Unspecified abdominal pain: Secondary | ICD-10-CM | POA: Diagnosis not present

## 2016-03-31 DIAGNOSIS — R197 Diarrhea, unspecified: Secondary | ICD-10-CM | POA: Diagnosis not present

## 2016-04-06 ENCOUNTER — Inpatient Hospital Stay (HOSPITAL_COMMUNITY)
Admission: EM | Admit: 2016-04-06 | Discharge: 2016-04-08 | DRG: 392 | Disposition: A | Discharge status: Home / Self Care | Payer: Medicare Other | Attending: Family Medicine | Admitting: Family Medicine

## 2016-04-06 ENCOUNTER — Encounter (HOSPITAL_COMMUNITY): Payer: Self-pay | Admitting: Emergency Medicine

## 2016-04-06 DIAGNOSIS — R109 Unspecified abdominal pain: Secondary | ICD-10-CM

## 2016-04-06 DIAGNOSIS — F419 Anxiety disorder, unspecified: Secondary | ICD-10-CM | POA: Diagnosis present

## 2016-04-06 DIAGNOSIS — J449 Chronic obstructive pulmonary disease, unspecified: Secondary | ICD-10-CM | POA: Diagnosis present

## 2016-04-06 DIAGNOSIS — K760 Fatty (change of) liver, not elsewhere classified: Secondary | ICD-10-CM | POA: Diagnosis not present

## 2016-04-06 DIAGNOSIS — R634 Abnormal weight loss: Secondary | ICD-10-CM | POA: Diagnosis present

## 2016-04-06 DIAGNOSIS — Z8249 Family history of ischemic heart disease and other diseases of the circulatory system: Secondary | ICD-10-CM

## 2016-04-06 DIAGNOSIS — Z91013 Allergy to seafood: Secondary | ICD-10-CM

## 2016-04-06 DIAGNOSIS — K529 Noninfective gastroenteritis and colitis, unspecified: Secondary | ICD-10-CM | POA: Diagnosis not present

## 2016-04-06 DIAGNOSIS — Z888 Allergy status to other drugs, medicaments and biological substances status: Secondary | ICD-10-CM

## 2016-04-06 DIAGNOSIS — I10 Essential (primary) hypertension: Secondary | ICD-10-CM | POA: Diagnosis not present

## 2016-04-06 DIAGNOSIS — E119 Type 2 diabetes mellitus without complications: Secondary | ICD-10-CM

## 2016-04-06 DIAGNOSIS — N898 Other specified noninflammatory disorders of vagina: Secondary | ICD-10-CM

## 2016-04-06 DIAGNOSIS — K5732 Diverticulitis of large intestine without perforation or abscess without bleeding: Principal | ICD-10-CM | POA: Diagnosis present

## 2016-04-06 DIAGNOSIS — F32A Depression, unspecified: Secondary | ICD-10-CM | POA: Diagnosis present

## 2016-04-06 DIAGNOSIS — F329 Major depressive disorder, single episode, unspecified: Secondary | ICD-10-CM | POA: Diagnosis present

## 2016-04-06 DIAGNOSIS — Z880 Allergy status to penicillin: Secondary | ICD-10-CM

## 2016-04-06 DIAGNOSIS — K5792 Diverticulitis of intestine, part unspecified, without perforation or abscess without bleeding: Secondary | ICD-10-CM | POA: Diagnosis present

## 2016-04-06 DIAGNOSIS — M1990 Unspecified osteoarthritis, unspecified site: Secondary | ICD-10-CM | POA: Diagnosis present

## 2016-04-06 DIAGNOSIS — Z833 Family history of diabetes mellitus: Secondary | ICD-10-CM

## 2016-04-06 DIAGNOSIS — E860 Dehydration: Secondary | ICD-10-CM | POA: Diagnosis not present

## 2016-04-06 DIAGNOSIS — Z79899 Other long term (current) drug therapy: Secondary | ICD-10-CM

## 2016-04-06 DIAGNOSIS — Z882 Allergy status to sulfonamides status: Secondary | ICD-10-CM

## 2016-04-06 DIAGNOSIS — R197 Diarrhea, unspecified: Secondary | ICD-10-CM | POA: Diagnosis not present

## 2016-04-06 DIAGNOSIS — R140 Abdominal distension (gaseous): Secondary | ICD-10-CM

## 2016-04-06 DIAGNOSIS — F17200 Nicotine dependence, unspecified, uncomplicated: Secondary | ICD-10-CM | POA: Diagnosis present

## 2016-04-06 DIAGNOSIS — Z9049 Acquired absence of other specified parts of digestive tract: Secondary | ICD-10-CM

## 2016-04-06 DIAGNOSIS — Z7984 Long term (current) use of oral hypoglycemic drugs: Secondary | ICD-10-CM

## 2016-04-06 DIAGNOSIS — Z881 Allergy status to other antibiotic agents status: Secondary | ICD-10-CM

## 2016-04-06 DIAGNOSIS — F17210 Nicotine dependence, cigarettes, uncomplicated: Secondary | ICD-10-CM | POA: Diagnosis present

## 2016-04-06 DIAGNOSIS — Z885 Allergy status to narcotic agent status: Secondary | ICD-10-CM

## 2016-04-06 LAB — COMPREHENSIVE METABOLIC PANEL
ALBUMIN: 3.4 g/dL — AB (ref 3.5–5.0)
ALK PHOS: 66 U/L (ref 38–126)
ALT: 11 U/L — ABNORMAL LOW (ref 14–54)
ANION GAP: 11 (ref 5–15)
AST: 19 U/L (ref 15–41)
BUN: 15 mg/dL (ref 6–20)
CALCIUM: 9.1 mg/dL (ref 8.9–10.3)
CHLORIDE: 104 mmol/L (ref 101–111)
CO2: 25 mmol/L (ref 22–32)
Creatinine, Ser: 0.76 mg/dL (ref 0.44–1.00)
GFR calc Af Amer: >60 mL/min (ref >60)
GFR calc non Af Amer: >60 mL/min (ref >60)
Glucose, Bld: 126 mg/dL — ABNORMAL HIGH (ref 65–99)
Potassium: 3.6 mmol/L (ref 3.5–5.1)
SODIUM: 140 mmol/L (ref 135–145)
Total Bilirubin: 0.3 mg/dL (ref 0.3–1.2)
Total Protein: 7 g/dL (ref 6.5–8.1)

## 2016-04-06 LAB — CBC
HEMATOCRIT: 38.8 % (ref 36.0–46.0)
Hemoglobin: 12.7 g/dL (ref 12.0–15.0)
MCH: 27.9 pg (ref 26.0–34.0)
MCHC: 32.7 g/dL (ref 30.0–36.0)
MCV: 85.1 fL (ref 78.0–100.0)
Platelets: 353 10*3/uL (ref 150–400)
RBC: 4.56 MIL/uL (ref 3.87–5.11)
RDW: 15.8 % — ABNORMAL HIGH (ref 11.5–15.5)
WBC: 10.6 10*3/uL — ABNORMAL HIGH (ref 4.0–10.5)

## 2016-04-06 LAB — CBG MONITORING, ED: Glucose-Capillary: 137 mg/dL — ABNORMAL HIGH (ref 65–99)

## 2016-04-06 LAB — I-STAT CG4 LACTIC ACID, ED: LACTIC ACID, VENOUS: 1.43 mmol/L (ref 0.5–1.9)

## 2016-04-06 LAB — LIPASE, BLOOD: LIPASE: 12 U/L (ref 11–51)

## 2016-04-06 MED ORDER — SODIUM CHLORIDE 0.9 % IV BOLUS (SEPSIS)
1000.0000 mL | Freq: Once | INTRAVENOUS | Status: AC
  Administered 2016-04-06: 1000 mL via INTRAVENOUS

## 2016-04-06 NOTE — ED Triage Notes (Signed)
Pt sts abd pain and diarrhea x 1 month from colitis; pt sts tried flagyl without relief; sent by PCP
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
## 2016-04-07 ENCOUNTER — Encounter (HOSPITAL_COMMUNITY): Payer: Self-pay | Admitting: General Practice

## 2016-04-07 ENCOUNTER — Emergency Department (HOSPITAL_COMMUNITY): Payer: Medicare Other

## 2016-04-07 DIAGNOSIS — Z8249 Family history of ischemic heart disease and other diseases of the circulatory system: Secondary | ICD-10-CM | POA: Diagnosis not present

## 2016-04-07 DIAGNOSIS — F329 Major depressive disorder, single episode, unspecified: Secondary | ICD-10-CM | POA: Diagnosis present

## 2016-04-07 DIAGNOSIS — F17200 Nicotine dependence, unspecified, uncomplicated: Secondary | ICD-10-CM | POA: Diagnosis not present

## 2016-04-07 DIAGNOSIS — F17210 Nicotine dependence, cigarettes, uncomplicated: Secondary | ICD-10-CM | POA: Diagnosis present

## 2016-04-07 DIAGNOSIS — R140 Abdominal distension (gaseous): Secondary | ICD-10-CM | POA: Diagnosis not present

## 2016-04-07 DIAGNOSIS — K760 Fatty (change of) liver, not elsewhere classified: Secondary | ICD-10-CM | POA: Diagnosis not present

## 2016-04-07 DIAGNOSIS — J438 Other emphysema: Secondary | ICD-10-CM

## 2016-04-07 DIAGNOSIS — Z7984 Long term (current) use of oral hypoglycemic drugs: Secondary | ICD-10-CM | POA: Diagnosis not present

## 2016-04-07 DIAGNOSIS — Z79899 Other long term (current) drug therapy: Secondary | ICD-10-CM | POA: Diagnosis not present

## 2016-04-07 DIAGNOSIS — Z91013 Allergy to seafood: Secondary | ICD-10-CM | POA: Diagnosis not present

## 2016-04-07 DIAGNOSIS — R634 Abnormal weight loss: Secondary | ICD-10-CM | POA: Diagnosis present

## 2016-04-07 DIAGNOSIS — M1990 Unspecified osteoarthritis, unspecified site: Secondary | ICD-10-CM | POA: Diagnosis present

## 2016-04-07 DIAGNOSIS — Z888 Allergy status to other drugs, medicaments and biological substances status: Secondary | ICD-10-CM | POA: Diagnosis not present

## 2016-04-07 DIAGNOSIS — R109 Unspecified abdominal pain: Secondary | ICD-10-CM | POA: Diagnosis not present

## 2016-04-07 DIAGNOSIS — Z885 Allergy status to narcotic agent status: Secondary | ICD-10-CM | POA: Diagnosis not present

## 2016-04-07 DIAGNOSIS — Z880 Allergy status to penicillin: Secondary | ICD-10-CM | POA: Diagnosis not present

## 2016-04-07 DIAGNOSIS — K5732 Diverticulitis of large intestine without perforation or abscess without bleeding: Secondary | ICD-10-CM | POA: Diagnosis not present

## 2016-04-07 DIAGNOSIS — J449 Chronic obstructive pulmonary disease, unspecified: Secondary | ICD-10-CM | POA: Diagnosis present

## 2016-04-07 DIAGNOSIS — I10 Essential (primary) hypertension: Secondary | ICD-10-CM | POA: Diagnosis not present

## 2016-04-07 DIAGNOSIS — E1100 Type 2 diabetes mellitus with hyperosmolarity without nonketotic hyperglycemic-hyperosmolar coma (NKHHC): Secondary | ICD-10-CM | POA: Diagnosis not present

## 2016-04-07 DIAGNOSIS — F419 Anxiety disorder, unspecified: Secondary | ICD-10-CM | POA: Diagnosis present

## 2016-04-07 DIAGNOSIS — Z833 Family history of diabetes mellitus: Secondary | ICD-10-CM | POA: Diagnosis not present

## 2016-04-07 DIAGNOSIS — Z882 Allergy status to sulfonamides status: Secondary | ICD-10-CM | POA: Diagnosis not present

## 2016-04-07 DIAGNOSIS — Z881 Allergy status to other antibiotic agents status: Secondary | ICD-10-CM | POA: Diagnosis not present

## 2016-04-07 DIAGNOSIS — Z9049 Acquired absence of other specified parts of digestive tract: Secondary | ICD-10-CM | POA: Diagnosis not present

## 2016-04-07 DIAGNOSIS — E119 Type 2 diabetes mellitus without complications: Secondary | ICD-10-CM | POA: Diagnosis present

## 2016-04-07 LAB — GLUCOSE, CAPILLARY
GLUCOSE-CAPILLARY: 172 mg/dL — AB (ref 65–99)
GLUCOSE-CAPILLARY: 184 mg/dL — AB (ref 65–99)
Glucose-Capillary: 150 mg/dL — ABNORMAL HIGH (ref 65–99)
Glucose-Capillary: 160 mg/dL — ABNORMAL HIGH (ref 65–99)

## 2016-04-07 LAB — CBC
HEMATOCRIT: 33.3 % — AB (ref 36.0–46.0)
HEMOGLOBIN: 11.1 g/dL — AB (ref 12.0–15.0)
MCH: 28.2 pg (ref 26.0–34.0)
MCHC: 33.3 g/dL (ref 30.0–36.0)
MCV: 84.7 fL (ref 78.0–100.0)
Platelets: 306 10*3/uL (ref 150–400)
RBC: 3.93 MIL/uL (ref 3.87–5.11)
RDW: 16.1 % — AB (ref 11.5–15.5)
WBC: 10.1 10*3/uL (ref 4.0–10.5)

## 2016-04-07 LAB — BASIC METABOLIC PANEL
ANION GAP: 11 (ref 5–15)
BUN: 12 mg/dL (ref 6–20)
CALCIUM: 8.3 mg/dL — AB (ref 8.9–10.3)
CHLORIDE: 105 mmol/L (ref 101–111)
CO2: 23 mmol/L (ref 22–32)
Creatinine, Ser: 0.6 mg/dL (ref 0.44–1.00)
GFR calc Af Amer: >60 mL/min (ref >60)
GFR calc non Af Amer: >60 mL/min (ref >60)
GLUCOSE: 199 mg/dL — AB (ref 65–99)
Potassium: 3 mmol/L — ABNORMAL LOW (ref 3.5–5.1)
Sodium: 139 mmol/L (ref 135–145)

## 2016-04-07 LAB — URINALYSIS, ROUTINE W REFLEX MICROSCOPIC
Bacteria, UA: NONE SEEN
Bilirubin Urine: NEGATIVE
GLUCOSE, UA: NEGATIVE mg/dL
HGB URINE DIPSTICK: NEGATIVE
KETONES UR: NEGATIVE mg/dL
NITRITE: NEGATIVE
PH: 5 (ref 5.0–8.0)
PROTEIN: NEGATIVE mg/dL
Specific Gravity, Urine: 1.027 (ref 1.005–1.030)

## 2016-04-07 LAB — C DIFFICILE QUICK SCREEN W PCR REFLEX
C Diff antigen: NEGATIVE
C Diff interpretation: NOT DETECTED
C Diff toxin: NEGATIVE

## 2016-04-07 MED ORDER — INSULIN ASPART 100 UNIT/ML ~~LOC~~ SOLN
0.0000 [IU] | Freq: Three times a day (TID) | SUBCUTANEOUS | Status: DC
  Administered 2016-04-07: 2 [IU] via SUBCUTANEOUS
  Administered 2016-04-07: 1 [IU] via SUBCUTANEOUS
  Administered 2016-04-07: 2 [IU] via SUBCUTANEOUS
  Administered 2016-04-08 (×2): 1 [IU] via SUBCUTANEOUS

## 2016-04-07 MED ORDER — MORPHINE SULFATE (PF) 4 MG/ML IV SOLN: 4.0000 mg | Freq: Once | INTRAVENOUS | Status: DC

## 2016-04-07 MED ORDER — SODIUM CHLORIDE 0.9 % IV SOLN
INTRAVENOUS | Status: DC
  Administered 2016-04-07 (×2): via INTRAVENOUS

## 2016-04-07 MED ORDER — IOPAMIDOL (ISOVUE-300) INJECTION 61%
INTRAVENOUS | Status: AC
  Administered 2016-04-07: 100 mL
  Filled 2016-04-07: qty 100

## 2016-04-07 MED ORDER — METRONIDAZOLE 500 MG PO TABS
500.0000 mg | ORAL_TABLET | Freq: Three times a day (TID) | ORAL | Status: DC
  Administered 2016-04-07 – 2016-04-08 (×4): 500 mg via ORAL
  Filled 2016-04-07 (×4): qty 1

## 2016-04-07 MED ORDER — METRONIDAZOLE IN NACL 5-0.79 MG/ML-% IV SOLN
500.0000 mg | Freq: Once | INTRAVENOUS | Status: AC
  Administered 2016-04-07: 500 mg via INTRAVENOUS
  Filled 2016-04-07: qty 100

## 2016-04-07 MED ORDER — DIPHENHYDRAMINE HCL 25 MG PO CAPS: 25.0000 mg | ORAL_CAPSULE | Freq: Four times a day (QID) | ORAL | Status: DC | PRN

## 2016-04-07 MED ORDER — ENOXAPARIN SODIUM 40 MG/0.4ML ~~LOC~~ SOLN
40.0000 mg | Freq: Every day | SUBCUTANEOUS | Status: DC
  Administered 2016-04-07 – 2016-04-08 (×2): 40 mg via SUBCUTANEOUS
  Filled 2016-04-07 (×2): qty 0.4

## 2016-04-07 MED ORDER — DULOXETINE HCL 60 MG PO CPEP
60.0000 mg | ORAL_CAPSULE | Freq: Two times a day (BID) | ORAL | Status: DC
  Administered 2016-04-07 – 2016-04-08 (×3): 60 mg via ORAL
  Filled 2016-04-07 (×3): qty 1

## 2016-04-07 MED ORDER — ZOLPIDEM TARTRATE 5 MG PO TABS
5.0000 mg | ORAL_TABLET | Freq: Every evening | ORAL | Status: DC | PRN
  Administered 2016-04-07: 5 mg via ORAL
  Filled 2016-04-07: qty 1

## 2016-04-07 MED ORDER — HYDROCHLOROTHIAZIDE 25 MG PO TABS
25.0000 mg | ORAL_TABLET | Freq: Every day | ORAL | Status: DC
  Administered 2016-04-07 – 2016-04-08 (×2): 25 mg via ORAL
  Filled 2016-04-07 (×2): qty 1

## 2016-04-07 MED ORDER — GI COCKTAIL ~~LOC~~
30.0000 mL | Freq: Three times a day (TID) | ORAL | Status: DC | PRN
  Administered 2016-04-07: 30 mL via ORAL
  Filled 2016-04-07 (×2): qty 30

## 2016-04-07 MED ORDER — NICOTINE 21 MG/24HR TD PT24
21.0000 mg | MEDICATED_PATCH | Freq: Every day | TRANSDERMAL | Status: DC
  Administered 2016-04-08: 21 mg via TRANSDERMAL
  Filled 2016-04-07 (×2): qty 1

## 2016-04-07 MED ORDER — AMLODIPINE BESYLATE 10 MG PO TABS
10.0000 mg | ORAL_TABLET | Freq: Every morning | ORAL | Status: DC
  Administered 2016-04-07 – 2016-04-08 (×2): 10 mg via ORAL
  Filled 2016-04-07 (×2): qty 1

## 2016-04-07 MED ORDER — ALPRAZOLAM 0.5 MG PO TABS
0.5000 mg | ORAL_TABLET | Freq: Two times a day (BID) | ORAL | Status: DC | PRN
  Administered 2016-04-07 – 2016-04-08 (×3): 0.5 mg via ORAL
  Filled 2016-04-07 (×3): qty 1

## 2016-04-07 MED ORDER — ACETAMINOPHEN 325 MG PO TABS: 650.0000 mg | ORAL_TABLET | Freq: Four times a day (QID) | ORAL | Status: DC | PRN

## 2016-04-07 MED ORDER — DEXTROSE 5 % IV SOLN: 2.0000 g | Freq: Once | INTRAVENOUS | Status: DC

## 2016-04-07 MED ORDER — ONDANSETRON HCL 4 MG/2ML IJ SOLN: 4.0000 mg | Freq: Three times a day (TID) | INTRAMUSCULAR | Status: DC | PRN

## 2016-04-07 MED ORDER — POTASSIUM CHLORIDE CRYS ER 20 MEQ PO TBCR
40.0000 meq | EXTENDED_RELEASE_TABLET | Freq: Two times a day (BID) | ORAL | Status: AC
  Administered 2016-04-07 (×2): 40 meq via ORAL
  Filled 2016-04-07 (×2): qty 2

## 2016-04-07 MED ORDER — METRONIDAZOLE IN NACL 5-0.79 MG/ML-% IV SOLN
500.0000 mg | Freq: Three times a day (TID) | INTRAVENOUS | Status: DC
  Filled 2016-04-07 (×2): qty 100

## 2016-04-07 MED ORDER — LOSARTAN POTASSIUM-HCTZ 100-25 MG PO TABS: 1.0000 | ORAL_TABLET | Freq: Every day | ORAL | Status: DC

## 2016-04-07 MED ORDER — CEFIXIME 400 MG PO CAPS
400.0000 mg | ORAL_CAPSULE | Freq: Every day | ORAL | Status: DC
  Administered 2016-04-07 – 2016-04-08 (×2): 400 mg via ORAL
  Filled 2016-04-07 (×2): qty 1

## 2016-04-07 MED ORDER — FERROUS SULFATE 325 (65 FE) MG PO TABS
325.0000 mg | ORAL_TABLET | Freq: Every day | ORAL | Status: DC
  Administered 2016-04-07 – 2016-04-08 (×2): 325 mg via ORAL
  Filled 2016-04-07 (×2): qty 1

## 2016-04-07 MED ORDER — AZTREONAM 1 G IJ SOLR: 1.0000 g | Freq: Three times a day (TID) | INTRAMUSCULAR | Status: DC

## 2016-04-07 MED ORDER — DEXTROSE 5 % IV SOLN
1.0000 g | Freq: Three times a day (TID) | INTRAVENOUS | Status: DC
  Administered 2016-04-07: 1 g via INTRAVENOUS
  Filled 2016-04-07 (×3): qty 1

## 2016-04-07 MED ORDER — LOSARTAN POTASSIUM 50 MG PO TABS
100.0000 mg | ORAL_TABLET | Freq: Every day | ORAL | Status: DC
  Administered 2016-04-07 – 2016-04-08 (×2): 100 mg via ORAL
  Filled 2016-04-07 (×2): qty 2

## 2016-04-07 MED ORDER — MOMETASONE FURO-FORMOTEROL FUM 100-5 MCG/ACT IN AERO
2.0000 | INHALATION_SPRAY | Freq: Two times a day (BID) | RESPIRATORY_TRACT | Status: DC
  Administered 2016-04-07 – 2016-04-08 (×3): 2 via RESPIRATORY_TRACT
  Filled 2016-04-07: qty 8.8

## 2016-04-07 MED ORDER — NYSTATIN 100000 UNIT/GM EX CREA
TOPICAL_CREAM | Freq: Two times a day (BID) | CUTANEOUS | Status: DC
Start: 2016-04-07 — End: 2016-04-08
  Filled 2016-04-07: qty 15

## 2016-04-07 MED ORDER — METRONIDAZOLE IN NACL 5-0.79 MG/ML-% IV SOLN: 500.0000 mg | Freq: Once | INTRAVENOUS | Status: DC

## 2016-04-07 MED ORDER — INSULIN ASPART 100 UNIT/ML ~~LOC~~ SOLN: 0.0000 [IU] | Freq: Every day | SUBCUTANEOUS | Status: DC

## 2016-04-07 MED ORDER — OXYCODONE HCL 5 MG PO TABS
10.0000 mg | ORAL_TABLET | Freq: Four times a day (QID) | ORAL | Status: DC | PRN
  Administered 2016-04-07: 10 mg via ORAL
  Filled 2016-04-07: qty 2
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
## 2016-04-07 NOTE — ED Notes (Signed)
Patient transported to CT 

## 2016-04-07 NOTE — Progress Notes (Signed)
PROGRESS NOTE    Leslie Gamble  FUX:[id number] DOB: Dec 28, 1940 DOA: 04/06/2016 PCP: Wenda Low, MD    Brief Narrative:  76 y.o. female with medical history significant of diverticulitis, hypertension, diabetes mellitus, asthma, depression, slightly, tobacco abuse, who presents with diarrhea and abdominal pain.  Patient states that she has been having intermittent abdominal pain and diarrhea in the past 3 weeks. Her abdominal pain is located in the right lower abdomen, intermittent, 5 out of 10 in severity, nonradiating. She had 4 bowel movements with loose stool yesterday. No blood in stool. Pt took 1-week of flagyl without significant improvement. Denies nausea and vomiting. No fever or chills. Patient does not have chest pain, SOB,cough, symptoms of UTI or unilateral weakness. Pt states that he took clindamycin for one month for tooth infection 6 weeks ago. Of note, MiraLAX is on her medication list.  Assessment & Plan:   Principal Problem:   Diverticulitis Active Problems:   Diabetes mellitus, type 2 (HCC)   Depression   COPD GOLD II   Smoker   Essential hypertension  Diverticulitis: CT abdomen/pelvis showed extensive colonic diverticulosis with mild acute diverticulitis of the transverse colon. No abscess. Pt is not septic. Lactate is normal. Hemodynamically stable. - Continue prn Zofran for nausea and prn oxycodone for pain - Cdiff neg - Patient initially started on IV Flagyl plus Aztreonam - Patient is tolerating regular PO. Patient has tolerated flagyl overnight without adverse events - Discused with pharmacy. Plan to transition to PO flagyl and PO 3rd gen cephalosporin and monitor overnight  DM-II: Last A1c 6.9 on 05/07/15, well controled. Patient is taking actos, metformin at home - Will continue SSI coverage as tolerated  Depression and anxiety: Stable, no suicidal or homicidal ideations. -Continue home medications: Xanax, Cymbalta -Currently stable  COPD GOLD  II: stable. -continue Dulera inhaler - Appears stable at present  Tobacco abuse: -Did counseling about importance of quitting smoking -Plan to continue nicotine patch  Essential hypertension: -Will continue hyzarr and amlodipine as tolerated -Stable  DVT prophylaxis: Lovenox subQ Code Status: Full Family Communication: Pt in room, family at bedside Disposition Plan:Possible d/c home in 24hrs if tolerating PO  Consultants:     Procedures:     Antimicrobials: Anti-infectives    Start     Dose/Rate Route Frequency Ordered Stop   04/07/16 1400  metroNIDAZOLE (FLAGYL) tablet 500 mg     500 mg Oral Every 8 hours 04/07/16 1208     04/07/16 1215  Cefixime (SUPRAX) capsule 400 mg     400 mg Oral Daily 04/07/16 1208     04/07/16 1000  metroNIDAZOLE (FLAGYL) IVPB 500 mg  Status:  Discontinued     500 mg 100 mL/hr over 60 Minutes Intravenous Every 8 hours 04/07/16 0255 04/07/16 1208   04/07/16 0130  aztreonam (AZACTAM) injection 1 g  Status:  Discontinued     1 g Intravenous Every 8 hours 04/07/16 0121 04/07/16 0121   04/07/16 0130  metroNIDAZOLE (FLAGYL) IVPB 500 mg     500 mg 100 mL/hr over 60 Minutes Intravenous  Once 04/07/16 0121 04/07/16 0258   04/07/16 0130  aztreonam (AZACTAM) 1 g in dextrose 5 % 50 mL IVPB  Status:  Discontinued     1 g 100 mL/hr over 30 Minutes Intravenous Every 8 hours 04/07/16 0122 04/07/16 1208   04/07/16 0100  cefTRIAXone (ROCEPHIN) 2 g in dextrose 5 % 50 mL IVPB  Status:  Discontinued     2 g 100 mL/hr over 30  Minutes Intravenous  Once 04/07/16 0058 04/07/16 0111   04/07/16 0100  metroNIDAZOLE (FLAGYL) IVPB 500 mg  Status:  Discontinued     500 mg 100 mL/hr over 60 Minutes Intravenous  Once 04/07/16 0058 04/07/16 0111       Subjective: Tolerating diet. Still complaining of abd discomfort  Objective: Vitals:   04/07/16 0813 04/07/16 0917 04/07/16 1012 04/07/16 1425  BP:  (!) 131/56 123/73 (!) 122/52  Pulse:  71  82  Resp:    16  Temp:   97.9 F (36.6 C)  97.9 F (36.6 C)  TempSrc:  Oral  Oral  SpO2: 95% 96%  95%  Weight:      Height:        Intake/Output Summary (Last 24 hours) at 04/07/16 1808 Last data filed at 04/07/16 1353  Gross per 24 hour  Intake          3263.34 ml  Output              150 ml  Net          3113.34 ml   Filed Weights   04/07/16 0305  Weight: 76.8 kg (169 lb 5 oz)    Examination:  General exam: Appears calm and comfortable  Respiratory system: Clear to auscultation. Respiratory effort normal. Cardiovascular system: S1 & S2 heard, RRR Gastrointestinal system: Abdomen is nondistended, soft and nontender. No organomegaly or masses felt. Normal bowel sounds heard. Central nervous system: Alert and oriented. No focal neurological deficits. Extremities: Symmetric 5 x 5 power. Skin: No rashes, lesions Psychiatry: Judgement and insight appear normal. Mood & affect appropriate.   Data Reviewed: I have personally reviewed following labs and imaging studies  CBC:  Recent Labs Lab 04/06/16 1709 04/07/16 0437  WBC 10.6* 10.1  HGB 12.7 11.1*  HCT 38.8 33.3*  MCV 85.1 84.7  PLT 353 884   Basic Metabolic Panel:  Recent Labs Lab 04/06/16 1709 04/07/16 0437  NA 140 139  K 3.6 3.0*  CL 104 105  CO2 25 23  GLUCOSE 126* 199*  BUN 15 12  CREATININE 0.76 0.60  CALCIUM 9.1 8.3*   GFR: Estimated Creatinine Clearance: 64.6 mL/min (by C-G formula based on SCr of 0.6 mg/dL). Liver Function Tests:  Recent Labs Lab 04/06/16 1709  AST 19  ALT 11*  ALKPHOS 66  BILITOT 0.3  PROT 7.0  ALBUMIN 3.4*    Recent Labs Lab 04/06/16 1709  LIPASE 12   No results for input(s): AMMONIA in the last 168 hours. Coagulation Profile: No results for input(s): INR, PROTIME in the last 168 hours. Cardiac Enzymes: No results for input(s): CKTOTAL, CKMB, CKMBINDEX, TROPONINI in the last 168 hours. BNP (last 3 results) No results for input(s): PROBNP in the last 8760 hours. HbA1C: No results  for input(s): HGBA1C in the last 72 hours. CBG:  Recent Labs Lab 04/06/16 2146 04/07/16 0759 04/07/16 1211 04/07/16 1712  GLUCAP 137* 150* 172* 160*   Lipid Profile: No results for input(s): CHOL, HDL, LDLCALC, TRIG, CHOLHDL, LDLDIRECT in the last 72 hours. Thyroid Function Tests: No results for input(s): TSH, T4TOTAL, FREET4, T3FREE, THYROIDAB in the last 72 hours. Anemia Panel: No results for input(s): VITAMINB12, FOLATE, FERRITIN, TIBC, IRON, RETICCTPCT in the last 72 hours. Sepsis Labs:  Recent Labs Lab 04/06/16 2206  LATICACIDVEN 1.43    Recent Results (from the past 240 hour(s))  C difficile quick scan w PCR reflex     Status: None   Collection Time: 04/07/16  3:28 AM  Result Value Ref Range Status   C Diff antigen NEGATIVE NEGATIVE Final   C Diff toxin NEGATIVE NEGATIVE Final   C Diff interpretation No C. difficile detected.  Final     Radiology Studies: Ct Abdomen Pelvis W Contrast  Result Date: 04/07/2016 CLINICAL DATA:  76 year old female with abdominal pain. History of diabetes and diverticulitis. EXAM: CT ABDOMEN AND PELVIS WITH CONTRAST TECHNIQUE: Multidetector CT imaging of the abdomen and pelvis was performed using the standard protocol following bolus administration of intravenous contrast. CONTRAST:  142m ISOVUE-300 IOPAMIDOL (ISOVUE-300) INJECTION 61% COMPARISON:  Abdominal CT dated 05/07/2015 FINDINGS: Lower chest: The visualized lung bases are clear. There is coronary vascular calcification. No intra-abdominal free air or free fluid. Hepatobiliary: There is diffuse fatty infiltration of the liver. 80 1 cm left hepatic hypodense lesion is not well characterized but may represent a cyst or hemangioma. No intrahepatic biliary ductal dilatation. The gallbladder is unremarkable. Pancreas: The pancreas is unremarkable. Minimal hazy appearance of the fat inferior to the pancreas, likely related to diverticulitis. Spleen: Normal in size without focal abnormality.  Adrenals/Urinary Tract: The adrenal glands, kidneys, visualized ureters, and urinary bladder appear unremarkable. There is symmetric uptake and excretion of contrast by kidneys bilaterally. Subcentimeter bilateral renal hypodense lesions are too small to characterize. Stomach/Bowel: This postsurgical changes of partial sigmoid resection with anastomotic suture. There is extensive colonic diverticulosis. There is mild haziness of the pericolonic fat around the mid transverse colon most consistent with mild acute diverticulitis. There is no drainable fluid collection or abscess. No evidence of bowel obstruction. Normal appendix. Vascular/Lymphatic: Advanced aortoiliac atherosclerotic disease. The origins of the celiac axis, SMA, and the renal arteries are patent. The SMV, splenic vein, and main portal vein are patent. No portal venous gas identified. There is no adenopathy. 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 Reproductive: The uterus is atrophic or partially resected. There is a 2.1 x 2.5 cm fat containing lesion in the region of the left adnexa most consistent with a dermoid. The right ovary appears unremarkable. Other: There is a midline vertical anterior pelvic wall incisional scar. Musculoskeletal: Degenerative changes of spine. Disc desiccation with vacuum phenomena primarily at L4-5. No acute fracture. IMPRESSION: 1. Extensive colonic diverticulosis with mild acute diverticulitis of the transverse colon. No abscess. 2. No bowel obstruction.  Normal appendix. 3. Fatty liver. Electronically Signed   By: AAnner CreteM.D.   On: 04/07/2016 00:48    Scheduled Meds: . amLODipine  10 mg Oral q morning - 10a  . Cefixime  400 mg Oral Daily  . DULoxetine  60 mg Oral BID  . enoxaparin (LOVENOX) injection  40 mg Subcutaneous Daily  . ferrous sulfate  325 mg Oral Q breakfast  . losartan  100 mg Oral Daily   And  . hydrochlorothiazide  25 mg Oral Daily  . insulin aspart  0-5 Units  Subcutaneous QHS  . insulin aspart  0-9 Units Subcutaneous TID WC  . metroNIDAZOLE  500 mg Oral Q8H  . mometasone-formoterol  2 puff Inhalation BID  . nicotine  21 mg Transdermal Daily  . nystatin cream   Topical BID  . potassium chloride  40 mEq Oral BID   Continuous Infusions: . sodium chloride 100 mL/hr at 04/07/16 0319     LOS: 0 days   [place], SOrpah Melter MD [facility] Pager [phone] If 7PM-7AM, please contact night-coverage [email or web address] Password [facility]3/20/2018, 6:08 PM

## 2016-04-08 ENCOUNTER — Inpatient Hospital Stay (HOSPITAL_COMMUNITY): Payer: Medicare Other

## 2016-04-08 DIAGNOSIS — K5732 Diverticulitis of large intestine without perforation or abscess without bleeding: Principal | ICD-10-CM

## 2016-04-08 DIAGNOSIS — E1100 Type 2 diabetes mellitus with hyperosmolarity without nonketotic hyperglycemic-hyperosmolar coma (NKHHC): Secondary | ICD-10-CM

## 2016-04-08 DIAGNOSIS — I10 Essential (primary) hypertension: Secondary | ICD-10-CM

## 2016-04-08 DIAGNOSIS — R109 Unspecified abdominal pain: Secondary | ICD-10-CM

## 2016-04-08 DIAGNOSIS — F17200 Nicotine dependence, unspecified, uncomplicated: Secondary | ICD-10-CM

## 2016-04-08 DIAGNOSIS — R140 Abdominal distension (gaseous): Secondary | ICD-10-CM

## 2016-04-08 LAB — BASIC METABOLIC PANEL
Anion gap: 10 (ref 5–15)
BUN: 7 mg/dL (ref 6–20)
CO2: 23 mmol/L (ref 22–32)
Calcium: 8.4 mg/dL — ABNORMAL LOW (ref 8.9–10.3)
Chloride: 107 mmol/L (ref 101–111)
Creatinine, Ser: 0.55 mg/dL (ref 0.44–1.00)
GFR calc Af Amer: >60 mL/min (ref >60)
GFR calc non Af Amer: >60 mL/min (ref >60)
Glucose, Bld: 130 mg/dL — ABNORMAL HIGH (ref 65–99)
Potassium: 3.4 mmol/L — ABNORMAL LOW (ref 3.5–5.1)
Sodium: 140 mmol/L (ref 135–145)

## 2016-04-08 LAB — CBC
HCT: 34.5 % — ABNORMAL LOW (ref 36.0–46.0)
Hemoglobin: 11.6 g/dL — ABNORMAL LOW (ref 12.0–15.0)
MCH: 28.3 pg (ref 26.0–34.0)
MCHC: 33.6 g/dL (ref 30.0–36.0)
MCV: 84.1 fL (ref 78.0–100.0)
Platelets: 329 10*3/uL (ref 150–400)
RBC: 4.1 MIL/uL (ref 3.87–5.11)
RDW: 15.8 % — ABNORMAL HIGH (ref 11.5–15.5)
WBC: 8.4 10*3/uL (ref 4.0–10.5)

## 2016-04-08 LAB — GLUCOSE, CAPILLARY
GLUCOSE-CAPILLARY: 144 mg/dL — AB (ref 65–99)
Glucose-Capillary: 142 mg/dL — ABNORMAL HIGH (ref 65–99)

## 2016-04-08 MED ORDER — CEFIXIME 400 MG PO CAPS: 400.0000 mg | ORAL_CAPSULE | Freq: Every day | ORAL | 0 refills | Status: AC

## 2016-04-08 MED ORDER — METRONIDAZOLE 500 MG PO TABS: 500.0000 mg | ORAL_TABLET | Freq: Three times a day (TID) | ORAL | 0 refills | Status: AC

## 2016-04-08 NOTE — Progress Notes (Signed)
Discharge instructions gone over with the patient. Home medications discussed. Prescriptions given. Follow up appointment to be made. Diet and activity discussed. Completion of antibiotic therapy emphasized. Patient verbalized understanding of instructions.

## 2016-04-08 NOTE — Discharge Instructions (Signed)

## 2016-04-08 NOTE — Discharge Summary (Signed)
Physician Discharge Summary  Leslie Gamble:[id number] DOB: 28-May-1940 DOA: 04/06/2016  PCP: Wenda Low, MD  Admit date: 04/06/2016 Discharge date: 04/08/2016  Admitted From: Home Disposition: Home  Recommendations for Outpatient Follow-up:  1. Follow up with PCP in 1 week  Discharge Condition: Stable CODE STATUS: Full code Diet recommendation: Carb modified   Brief/Interim Summary:  Admission HPI written by Ivor Costa, MD   Chief Complaint: Diarrhea and abdominal pain  HPI: Leslie Gamble is a 76 y.o. female with medical history significant of diverticulitis, hypertension, diabetes mellitus, asthma, depression, slightly, tobacco abuse, who presents with diarrhea and abdominal pain.  Patient states that she has been having intermittent abdominal pain and diarrhea in the past 3 weeks. Her abdominal pain is located in the right lower abdomen, intermittent, 5 out of 10 in severity, nonradiating. She had 4 bowel movements with loose stool yesterday. No blood in stool. Pt took 1-week of flagyl without significant improvement. Denies nausea and vomiting. No fever or chills. Patient does not have chest pain, SOB,cough, symptoms of UTI or unilateral weakness. Pt states that he took clindamycin for one month for tooth infection 6 weeks ago. Of note, MiraLAX is on her medication list.  ED Course: pt was found to have WBC 10.6, lactate of 1.43, lipase 12, urinalysis with small amount of leukocyte, temperature normal, no tachycardia, oxygen saturation 98% on room air. CT abdomen/pelvis showed extensive colonic diverticulosis with mild acute diverticulitis of the transverse colon. No abscess. Pt is placed on med-surg bed for obs.    Hospital course:  Diverticulitis Seen on CT scan. Started on IV flagyl and Aztreonam and was transitioned to oral flagyl and cefixime. Patient tolerated this regimen and was discharged with this regimen.  Diabetes mellitus, type 2 Last A1c of 6.9 in 2017.  Held Actos and metformin on admission. Sliding scale while in the hospital. Home medications restarted on discharge.  Depression Anxiety Continued home Xanax and Cymbalta  COPD GOLD II Stable. Continued Dulera.  Tobacco abuse Counseled. Patient not wanting to quit. Nicotine patch while in the hospital.  Essential hypertension Continued Hyzaar and amlodipine.  Discharge Diagnoses:  Principal Problem:   Diverticulitis Active Problems:   Diabetes mellitus, type 2 ([place])   Depression   COPD GOLD II   Smoker   Essential hypertension    Discharge Instructions  Discharge Instructions    Call MD for:  persistant nausea and vomiting    Complete by:  As directed    Call MD for:  severe uncontrolled pain    Complete by:  As directed    Call MD for:  temperature >100.4    Complete by:  As directed    Diet - low sodium heart healthy    Complete by:  As directed    Increase activity slowly    Complete by:  As directed      Allergies as of 04/08/2016      Reactions   Avelox [moxifloxacin Hcl In Nacl] Itching, Swelling   Cephalexin    Nose swelling   Ciprofloxacin Swelling   Clindamycin Hcl Swelling   Codeine    Lip swelling   Diflucan [fluconazole] Diarrhea   Lidocaine Swelling   Metronidazole Swelling   Nitrofurantoin Monohyd Macro Swelling   Other    Magic mouthwash    Penicillins Swelling   Shrimp [shellfish Allergy] Swelling   In nose and lips   Sulfa Antibiotics Swelling   Tetracyclines & Related Swelling   Tramadol Hcl  REACTION: facial swelling, dyspnea      Medication List    STOP taking these medications   oxyCODONE-acetaminophen 10-325 MG tablet Commonly known as:  PERCOCET     TAKE these medications   acetaminophen 500 MG tablet Commonly known as:  TYLENOL Take 1,000 mg by mouth every 6 (six) hours as needed for headache.   ALPRAZolam 0.5 MG tablet Commonly known as:  XANAX Take 0.5 mg by mouth 2 (two) times daily as needed for anxiety.    amLODipine 10 MG tablet Commonly known as:  NORVASC Take 10 mg by mouth every morning.   Cefixime 400 MG Caps capsule Commonly known as:  SUPRAX Take 1 capsule (400 mg total) by mouth daily. Start taking on:  04/09/2016   cyclobenzaprine 10 MG tablet Commonly known as:  FLEXERIL Take 1 tablet (10 mg total) by mouth 2 (two) times daily as needed for muscle spasms.   diphenhydrAMINE 25 mg capsule Commonly known as:  BENADRYL Take 1 capsule (25 mg total) by mouth every 6 (six) hours as needed. What changed:  reasons to take this   DULoxetine 30 MG capsule Commonly known as:  CYMBALTA Take 30 mg by mouth See admin instructions. Take two tablets in the morning then take one at night until Wednesday take two in the morning and two at night   ferrous sulfate 325 (65 FE) MG EC tablet Take 325 mg by mouth daily with breakfast.   KLOR-CON SPRINKLE 8 MEQ Cpcr capsule CR Generic drug:  Potassium Chloride CR Take 1 capsule by mouth 2 (two) times daily.   liraglutide 18 MG/3ML Sopn Inject 1.8 mg into the skin daily.   losartan-hydrochlorothiazide 100-25 MG tablet Commonly known as:  HYZAAR Take 1 tablet by mouth daily at 12 noon.   metFORMIN 1000 MG tablet Commonly known as:  GLUCOPHAGE Take 1,000 mg by mouth 2 (two) times daily with a meal.   methocarbamol 500 MG tablet Commonly known as:  ROBAXIN Take 1 tablet (500 mg total) by mouth 2 (two) times daily.   metroNIDAZOLE 500 MG tablet Commonly known as:  FLAGYL Take 1 tablet (500 mg total) by mouth every 8 (eight) hours.   mometasone-formoterol 100-5 MCG/ACT Aero Commonly known as:  DULERA Inhale 2 puffs into the lungs 2 (two) times daily.   Oxycodone HCl 10 MG Tabs Take 10 mg by mouth every 6 (six) hours as needed. For knee pain   pioglitazone 45 MG tablet Commonly known as:  ACTOS Take 22.5 mg by mouth daily.   polyethylene glycol packet Commonly known as:  MIRALAX / GLYCOLAX Take 17 g by mouth daily. What  changed:  when to take this  reasons to take this      Follow-up Information    HUSAIN,KARRAR, MD. Schedule an appointment as soon as possible for a visit in 1 week(s).   Specialty:  Internal Medicine Contact information: [address] [place] [address] [phone]          Allergies  Allergen Reactions  . Avelox [Moxifloxacin Hcl In Nacl] Itching and Swelling  . Cephalexin     Nose swelling  . Ciprofloxacin Swelling  . Clindamycin Hcl Swelling  . Codeine     Lip swelling  . Diflucan [Fluconazole] Diarrhea  . Lidocaine Swelling  . Metronidazole Swelling  . Nitrofurantoin Monohyd Macro Swelling  . Other     Magic mouthwash   . Penicillins Swelling  . Shrimp [Shellfish Allergy] Swelling    In  nose and lips  . Sulfa Antibiotics Swelling  . Tetracyclines & Related Swelling  . Tramadol Hcl     REACTION: facial swelling, dyspnea    Consultations:  None   Procedures/Studies: Dg Ankle Complete Right  Result Date: 03/18/2016 Please see detailed radiograph report in office note.  Ct Abdomen Pelvis W Contrast  Result Date: 04/07/2016 CLINICAL DATA:  76 year old female with abdominal pain. History of diabetes and diverticulitis. EXAM: CT ABDOMEN AND PELVIS WITH CONTRAST TECHNIQUE: Multidetector CT imaging of the abdomen and pelvis was performed using the standard protocol following bolus administration of intravenous contrast. CONTRAST:  180m ISOVUE-300 IOPAMIDOL (ISOVUE-300) INJECTION 61% COMPARISON:  Abdominal CT dated 05/07/2015 FINDINGS: Lower chest: The visualized lung bases are clear. There is coronary vascular calcification. No intra-abdominal free air or free fluid. Hepatobiliary: There is diffuse fatty infiltration of the liver. 80 1 cm left hepatic hypodense lesion is not well characterized but may represent a cyst or hemangioma. No intrahepatic biliary ductal dilatation. The gallbladder is unremarkable. Pancreas: The pancreas is  unremarkable. Minimal hazy appearance of the fat inferior to the pancreas, likely related to diverticulitis. Spleen: Normal in size without focal abnormality. Adrenals/Urinary Tract: The adrenal glands, kidneys, visualized ureters, and urinary bladder appear unremarkable. There is symmetric uptake and excretion of contrast by kidneys bilaterally. Subcentimeter bilateral renal hypodense lesions are too small to characterize. Stomach/Bowel: This postsurgical changes of partial sigmoid resection with anastomotic suture. There is extensive colonic diverticulosis. There is mild haziness of the pericolonic fat around the mid transverse colon most consistent with mild acute diverticulitis. There is no drainable fluid collection or abscess. No evidence of bowel obstruction. Normal appendix. Vascular/Lymphatic: Advanced aortoiliac atherosclerotic disease. The origins of the celiac axis, SMA, and the renal arteries are patent. The SMV, splenic vein, and main portal vein are patent. No portal venous gas identified. There is no adenopathy. 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 Reproductive: The uterus is atrophic or partially resected. There is a 2.1 x 2.5 cm fat containing lesion in the region of the left adnexa most consistent with a dermoid. The right ovary appears unremarkable. Other: There is a midline vertical anterior pelvic wall incisional scar. Musculoskeletal: Degenerative changes of spine. Disc desiccation with vacuum phenomena primarily at L4-5. No acute fracture. IMPRESSION: 1. Extensive colonic diverticulosis with mild acute diverticulitis of the transverse colon. No abscess. 2. No bowel obstruction.  Normal appendix. 3. Fatty liver. Electronically Signed   By: AAnner CreteM.D.   On: 04/07/2016 00:48   Mr Ankle Right Wo Contrast  Result Date: 03/28/2016 CLINICAL DATA:  Patient with a possible right medial malleolus fracture, possible posterior tibial tendon tear, and medial heel pain.  Symptoms for 7 days EXAM: MRI OF THE RIGHT ANKLE WITHOUT CONTRAST TECHNIQUE: Multiplanar, multisequence MR imaging of the ankle was performed. No intravenous contrast was administered. COMPARISON:  None. FINDINGS: TENDONS Peroneal: Peroneal longus tendon intact. Peroneal brevis intact. Posteromedial: Posterior tibial tendon intact. Flexor hallucis longus tendon intact. Flexor digitorum longus tendon intact. Anterior: Tibialis anterior tendon intact. Extensor hallucis longus tendon intact Extensor digitorum longus tendon intact. Achilles:  Intact. Plantar Fascia: Intact. LIGAMENTS Lateral: Anterior talofibular ligament intact. Calcaneofibular ligament intact. Posterior talofibular ligament intact. Anterior and posterior tibiofibular ligaments intact. Medial: Deltoid ligament intact. Spring ligament intact. CARTILAGE Ankle Joint: No joint effusion. Normal ankle mortise. No chondral defect. Subtalar Joints/Sinus Tarsi: Normal subtalar joints. No subtalar joint effusion. Normal sinus tarsi. Bones: No marrow signal abnormality. No fracture or dislocation. Mild  osteoarthritis of the talonavicular joint. Soft Tissue: Mild muscle edema in quadratus plantae muscle and abductor hallucis muscle most concerning for muscle strain. IMPRESSION: 1. Mild muscle edema in quadratus plantae muscle and abductor hallucis muscle most concerning for muscle strain. 2. No acute fracture of the right ankle. Electronically Signed   By: Kathreen Devoid   On: 03/28/2016 15:02   Dg Abd Portable 1v  Result Date: 04/08/2016 CLINICAL DATA:  Abdominal distention and diarrhea for 2-3 weeks. EXAM: PORTABLE ABDOMEN - 1 VIEW COMPARISON:  CT scan 04/07/2016 FINDINGS: The bowel gas pattern is unremarkable. No findings for obstruction an or perforation. The soft tissue shadows are maintained. No worrisome calcifications. Surgical changes are noted in the pelvis and left upper quadrant. The bony structures are unremarkable. IMPRESSION: Unremarkable  abdominal radiograph. Electronically Signed   By: Marijo Sanes M.D.   On: 04/08/2016 11:37       Subjective: Some mild abdominal tenderness. No nausea or vomiting.  Discharge Exam: Vitals:   04/07/16 2154 04/08/16 0449  BP: 121/65 133/71  Pulse: 78 75  Resp: 18 19  Temp: 98.4 F (36.9 C) 98.2 F (36.8 C)   Vitals:   04/07/16 1931 04/07/16 2154 04/08/16 0449 04/08/16 0759  BP:  121/65 133/71   Pulse: 73 78 75   Resp: '16 18 19   '$ Temp:  98.4 F (36.9 C) 98.2 F (36.8 C)   TempSrc:  Oral Oral   SpO2: 96% 97% 100% 94%  Weight:      Height:        General: Pt is alert, awake, not in acute distress Cardiovascular: RRR, S1/S2 +, no rubs, no gallops Respiratory: CTA bilaterally, no wheezing, no rhonchi Abdominal: Soft, mild upper quadrant tenderness, ND, bowel sounds + Extremities: no edema, no cyanosis    The results of significant diagnostics from this hospitalization (including imaging, microbiology, ancillary and laboratory) are listed below for reference.     Microbiology: Recent Results (from the past 240 hour(s))  C difficile quick scan w PCR reflex     Status: None   Collection Time: 04/07/16  3:28 AM  Result Value Ref Range Status   C Diff antigen NEGATIVE NEGATIVE Final   C Diff toxin NEGATIVE NEGATIVE Final   C Diff interpretation No C. difficile detected.  Final     Labs: BNP (last 3 results) No results for input(s): BNP in the last 8760 hours. Basic Metabolic Panel:  Recent Labs Lab 04/06/16 1709 04/07/16 0437 04/08/16 0311  NA 140 139 140  K 3.6 3.0* 3.4*  CL 104 105 107  CO2 '25 23 23  '$ GLUCOSE 126* 199* 130*  BUN '15 12 7  '$ CREATININE 0.76 0.60 0.55  CALCIUM 9.1 8.3* 8.4*   Liver Function Tests:  Recent Labs Lab 04/06/16 1709  AST 19  ALT 11*  ALKPHOS 66  BILITOT 0.3  PROT 7.0  ALBUMIN 3.4*    Recent Labs Lab 04/06/16 1709  LIPASE 12   No results for input(s): AMMONIA in the last 168 hours. CBC:  Recent Labs Lab  04/06/16 1709 04/07/16 0437 04/08/16 0311  WBC 10.6* 10.1 8.4  HGB 12.7 11.1* 11.6*  HCT 38.8 33.3* 34.5*  MCV 85.1 84.7 84.1  PLT 353 306 329   Cardiac Enzymes: No results for input(s): CKTOTAL, CKMB, CKMBINDEX, TROPONINI in the last 168 hours. BNP: Invalid input(s): POCBNP CBG:  Recent Labs Lab 04/07/16 1211 04/07/16 1712 04/07/16 2136 04/08/16 0803 04/08/16 1212  GLUCAP 172* 160* 184* 142* 144*  D-Dimer No results for input(s): DDIMER in the last 72 hours. Hgb A1c No results for input(s): HGBA1C in the last 72 hours. Lipid Profile No results for input(s): CHOL, HDL, LDLCALC, TRIG, CHOLHDL, LDLDIRECT in the last 72 hours. Thyroid function studies No results for input(s): TSH, T4TOTAL, T3FREE, THYROIDAB in the last 72 hours.  Invalid input(s): FREET3 Anemia work up No results for input(s): VITAMINB12, FOLATE, FERRITIN, TIBC, IRON, RETICCTPCT in the last 72 hours. Urinalysis    Component Value Date/Time   COLORURINE YELLOW 04/07/2016 0122   APPEARANCEUR CLEAR 04/07/2016 0122   LABSPEC 1.027 04/07/2016 0122   PHURINE 5.0 04/07/2016 0122   GLUCOSEU NEGATIVE 04/07/2016 0122   GLUCOSEU NEGATIVE 08/31/2012 0836   HGBUR NEGATIVE 04/07/2016 0122   BILIRUBINUR NEGATIVE 04/07/2016 0122   KETONESUR NEGATIVE 04/07/2016 0122   PROTEINUR NEGATIVE 04/07/2016 0122   UROBILINOGEN 0.2 05/31/2014 1402   NITRITE NEGATIVE 04/07/2016 0122   LEUKOCYTESUR SMALL (A) 04/07/2016 0122   Sepsis Labs Invalid input(s): PROCALCITONIN,  WBC,  LACTICIDVEN Microbiology Recent Results (from the past 240 hour(s))  C difficile quick scan w PCR reflex     Status: None   Collection Time: 04/07/16  3:28 AM  Result Value Ref Range Status   C Diff antigen NEGATIVE NEGATIVE Final   C Diff toxin NEGATIVE NEGATIVE Final   C Diff interpretation No C. difficile detected.  Final     Time coordinating discharge: Over 30 minutes  SIGNED:   Cordelia Poche, MD [facility] 04/08/2016, 1:05  PM Pager [phone]  If 7PM-7AM, please contact night-coverage [email or web address] Password [email or web address]

## 2016-04-15 DIAGNOSIS — K5792 Diverticulitis of intestine, part unspecified, without perforation or abscess without bleeding: Secondary | ICD-10-CM | POA: Diagnosis not present

## 2016-04-15 DIAGNOSIS — R197 Diarrhea, unspecified: Secondary | ICD-10-CM | POA: Diagnosis not present

## 2016-04-16 ENCOUNTER — Telehealth: Payer: Self-pay | Admitting: *Deleted

## 2016-04-16 MED ORDER — TERCONAZOLE 0.8 % VA CREA: 1.0000 | TOPICAL_CREAM | Freq: Every day | VAGINAL | 0 refills | Status: DC

## 2016-04-16 NOTE — Telephone Encounter (Signed)
Pt called c/o yeast infection itching and white discharge has been in hospital, has been on lots of medication due to lots of antibiotics  just got out last week. Overdue for annual exam, unable to come in for OV at the time. Pt has lots of allergies asked if Terazol 3 day cream can be sent. Please advise

## 2016-04-16 NOTE — Telephone Encounter (Signed)
Yes please call in for her

## 2016-04-16 NOTE — Telephone Encounter (Signed)
Pt informed, Rx sent. 

## 2016-05-04 DIAGNOSIS — F331 Major depressive disorder, recurrent, moderate: Secondary | ICD-10-CM | POA: Diagnosis not present

## 2016-05-14 DIAGNOSIS — Z6825 Body mass index (BMI) 25.0-25.9, adult: Secondary | ICD-10-CM | POA: Diagnosis not present

## 2016-05-14 DIAGNOSIS — M150 Primary generalized (osteo)arthritis: Secondary | ICD-10-CM | POA: Diagnosis not present

## 2016-05-14 DIAGNOSIS — R768 Other specified abnormal immunological findings in serum: Secondary | ICD-10-CM | POA: Diagnosis not present

## 2016-05-14 DIAGNOSIS — E663 Overweight: Secondary | ICD-10-CM | POA: Diagnosis not present

## 2016-05-14 DIAGNOSIS — M2550 Pain in unspecified joint: Secondary | ICD-10-CM | POA: Diagnosis not present

## 2016-06-03 ENCOUNTER — Encounter: Payer: Self-pay | Admitting: Gynecology

## 2016-06-09 DIAGNOSIS — F331 Major depressive disorder, recurrent, moderate: Secondary | ICD-10-CM | POA: Diagnosis not present

## 2016-06-18 DIAGNOSIS — M1711 Unilateral primary osteoarthritis, right knee: Secondary | ICD-10-CM | POA: Diagnosis not present

## 2016-06-18 DIAGNOSIS — M7551 Bursitis of right shoulder: Secondary | ICD-10-CM | POA: Diagnosis not present

## 2016-07-09 DIAGNOSIS — K0510 Chronic gingivitis, plaque induced: Secondary | ICD-10-CM | POA: Diagnosis not present

## 2016-07-15 DIAGNOSIS — E1140 Type 2 diabetes mellitus with diabetic neuropathy, unspecified: Secondary | ICD-10-CM | POA: Diagnosis not present

## 2016-07-15 DIAGNOSIS — J449 Chronic obstructive pulmonary disease, unspecified: Secondary | ICD-10-CM | POA: Diagnosis not present

## 2016-07-15 DIAGNOSIS — Z0000 Encounter for general adult medical examination without abnormal findings: Secondary | ICD-10-CM | POA: Diagnosis not present

## 2016-07-15 DIAGNOSIS — Z1231 Encounter for screening mammogram for malignant neoplasm of breast: Secondary | ICD-10-CM | POA: Diagnosis not present

## 2016-07-15 DIAGNOSIS — I700 Atherosclerosis of aorta: Secondary | ICD-10-CM | POA: Diagnosis not present

## 2016-07-15 DIAGNOSIS — Q6100 Congenital renal cyst, unspecified: Secondary | ICD-10-CM | POA: Diagnosis not present

## 2016-07-15 DIAGNOSIS — Z1389 Encounter for screening for other disorder: Secondary | ICD-10-CM | POA: Diagnosis not present

## 2016-07-15 DIAGNOSIS — I10 Essential (primary) hypertension: Secondary | ICD-10-CM | POA: Diagnosis not present

## 2016-07-15 DIAGNOSIS — J45909 Unspecified asthma, uncomplicated: Secondary | ICD-10-CM | POA: Diagnosis not present

## 2016-07-15 DIAGNOSIS — J309 Allergic rhinitis, unspecified: Secondary | ICD-10-CM | POA: Diagnosis not present

## 2016-07-15 DIAGNOSIS — E119 Type 2 diabetes mellitus without complications: Secondary | ICD-10-CM | POA: Diagnosis not present

## 2016-07-15 DIAGNOSIS — M1990 Unspecified osteoarthritis, unspecified site: Secondary | ICD-10-CM | POA: Diagnosis not present

## 2016-07-15 DIAGNOSIS — M8580 Other specified disorders of bone density and structure, unspecified site: Secondary | ICD-10-CM | POA: Diagnosis not present

## 2016-07-15 DIAGNOSIS — F324 Major depressive disorder, single episode, in partial remission: Secondary | ICD-10-CM | POA: Diagnosis not present

## 2016-07-15 DIAGNOSIS — F5104 Psychophysiologic insomnia: Secondary | ICD-10-CM | POA: Diagnosis not present

## 2016-08-05 ENCOUNTER — Ambulatory Visit (INDEPENDENT_AMBULATORY_CARE_PROVIDER_SITE_OTHER): Payer: Medicare Other | Admitting: Podiatry

## 2016-08-05 DIAGNOSIS — M79676 Pain in unspecified toe(s): Secondary | ICD-10-CM | POA: Diagnosis not present

## 2016-08-05 DIAGNOSIS — E0842 Diabetes mellitus due to underlying condition with diabetic polyneuropathy: Secondary | ICD-10-CM

## 2016-08-05 DIAGNOSIS — B351 Tinea unguium: Secondary | ICD-10-CM | POA: Diagnosis not present

## 2016-08-07 NOTE — Progress Notes (Signed)
   SUBJECTIVE Patient with a history of diabetes mellitus presents to office today complaining of elongated, thickened nails. Pain while ambulating in shoes. Patient is unable to trim their own nails.   OBJECTIVE General Patient is awake, alert, and oriented x 3 and in no acute distress. Derm Skin is dry and supple bilateral. Negative open lesions or macerations. Remaining integument unremarkable. Nails are tender, long, thickened and dystrophic with subungual debris, consistent with onychomycosis, 1-5 bilateral. No signs of infection noted. Vasc  DP and PT pedal pulses palpable bilaterally. Temperature gradient within normal limits.  Neuro Epicritic and protective threshold sensation diminished bilaterally.  Musculoskeletal Exam No symptomatic pedal deformities noted bilateral. Muscular strength within normal limits.  ASSESSMENT 1. Diabetes Mellitus w/ peripheral neuropathy 2. Onychomycosis of nail due to dermatophyte bilateral 3. Pain in foot bilateral  PLAN OF CARE 1. Patient evaluated today. 2. Instructed to maintain good pedal hygiene and foot care. Stressed importance of controlling blood sugar.  3. Mechanical debridement of nails 1-5 bilaterally performed using a nail nipper. Filed with dremel without incident.  4. Return to clinic in 3 mos.     Itali Mckendry M. Tyann Niehaus, DPM [facility]  Dr. Carlosdaniel Grob M. Lavra Imler, DPM    [address]                                        [place], [place] [address]                Office [phone]  Fax [phone]       

## 2016-08-10 DIAGNOSIS — J449 Chronic obstructive pulmonary disease, unspecified: Secondary | ICD-10-CM | POA: Diagnosis not present

## 2016-08-10 DIAGNOSIS — J309 Allergic rhinitis, unspecified: Secondary | ICD-10-CM | POA: Diagnosis not present

## 2016-08-10 DIAGNOSIS — J0101 Acute recurrent maxillary sinusitis: Secondary | ICD-10-CM | POA: Diagnosis not present

## 2016-08-12 ENCOUNTER — Other Ambulatory Visit: Payer: Self-pay | Admitting: Internal Medicine

## 2016-08-12 DIAGNOSIS — N281 Cyst of kidney, acquired: Secondary | ICD-10-CM

## 2016-08-26 DIAGNOSIS — M8588 Other specified disorders of bone density and structure, other site: Secondary | ICD-10-CM | POA: Diagnosis not present

## 2016-08-27 ENCOUNTER — Ambulatory Visit
Admission: RE | Admit: 2016-08-27 | Discharge: 2016-08-27 | Disposition: A | Discharge status: Home / Self Care | Payer: Medicare Other | Source: Ambulatory Visit | Attending: Internal Medicine | Admitting: Internal Medicine

## 2016-08-27 DIAGNOSIS — N289 Disorder of kidney and ureter, unspecified: Secondary | ICD-10-CM | POA: Diagnosis not present

## 2016-08-27 DIAGNOSIS — K5730 Diverticulosis of large intestine without perforation or abscess without bleeding: Secondary | ICD-10-CM | POA: Diagnosis not present

## 2016-08-27 DIAGNOSIS — N281 Cyst of kidney, acquired: Secondary | ICD-10-CM

## 2016-08-27 MED ORDER — GADOBENATE DIMEGLUMINE 529 MG/ML IV SOLN
15.0000 mL | Freq: Once | INTRAVENOUS | Status: AC | PRN
  Administered 2016-08-27: 15 mL via INTRAVENOUS

## 2016-09-01 DIAGNOSIS — F331 Major depressive disorder, recurrent, moderate: Secondary | ICD-10-CM | POA: Diagnosis not present

## 2016-09-03 DIAGNOSIS — M25511 Pain in right shoulder: Secondary | ICD-10-CM | POA: Diagnosis not present

## 2016-09-03 DIAGNOSIS — J309 Allergic rhinitis, unspecified: Secondary | ICD-10-CM | POA: Diagnosis not present

## 2016-09-16 DIAGNOSIS — M25512 Pain in left shoulder: Secondary | ICD-10-CM | POA: Diagnosis not present

## 2016-09-16 DIAGNOSIS — M25552 Pain in left hip: Secondary | ICD-10-CM | POA: Diagnosis not present

## 2016-09-16 DIAGNOSIS — M545 Low back pain: Secondary | ICD-10-CM | POA: Diagnosis not present

## 2016-10-06 DIAGNOSIS — F331 Major depressive disorder, recurrent, moderate: Secondary | ICD-10-CM | POA: Diagnosis not present

## 2016-10-19 ENCOUNTER — Emergency Department (HOSPITAL_COMMUNITY): Payer: Medicare Other

## 2016-10-19 ENCOUNTER — Emergency Department (HOSPITAL_COMMUNITY)
Admission: EM | Admit: 2016-10-19 | Discharge: 2016-10-19 | Disposition: A | Discharge status: Home / Self Care | Payer: Medicare Other | Attending: Emergency Medicine | Admitting: Emergency Medicine

## 2016-10-19 ENCOUNTER — Encounter (HOSPITAL_COMMUNITY): Payer: Self-pay | Admitting: Emergency Medicine

## 2016-10-19 DIAGNOSIS — J449 Chronic obstructive pulmonary disease, unspecified: Secondary | ICD-10-CM | POA: Insufficient documentation

## 2016-10-19 DIAGNOSIS — G8929 Other chronic pain: Secondary | ICD-10-CM | POA: Insufficient documentation

## 2016-10-19 DIAGNOSIS — F17210 Nicotine dependence, cigarettes, uncomplicated: Secondary | ICD-10-CM | POA: Insufficient documentation

## 2016-10-19 DIAGNOSIS — E118 Type 2 diabetes mellitus with unspecified complications: Secondary | ICD-10-CM | POA: Diagnosis not present

## 2016-10-19 DIAGNOSIS — I10 Essential (primary) hypertension: Secondary | ICD-10-CM | POA: Diagnosis not present

## 2016-10-19 DIAGNOSIS — M25511 Pain in right shoulder: Secondary | ICD-10-CM | POA: Diagnosis not present

## 2016-10-19 DIAGNOSIS — Z79899 Other long term (current) drug therapy: Secondary | ICD-10-CM | POA: Insufficient documentation

## 2016-10-19 DIAGNOSIS — Z7984 Long term (current) use of oral hypoglycemic drugs: Secondary | ICD-10-CM | POA: Insufficient documentation

## 2016-10-19 DIAGNOSIS — J45909 Unspecified asthma, uncomplicated: Secondary | ICD-10-CM | POA: Diagnosis not present

## 2016-10-19 MED ORDER — NAPROXEN 500 MG PO TABS: 500.0000 mg | ORAL_TABLET | Freq: Two times a day (BID) | ORAL | 0 refills | Status: DC

## 2016-10-19 MED ORDER — METHOCARBAMOL 500 MG PO TABS: 500.0000 mg | ORAL_TABLET | Freq: Two times a day (BID) | ORAL | 0 refills | Status: DC

## 2016-10-19 MED ORDER — OXYCODONE-ACETAMINOPHEN 5-325 MG PO TABS
1.0000 | ORAL_TABLET | Freq: Once | ORAL | Status: AC
  Administered 2016-10-19: 1 via ORAL
  Filled 2016-10-19: qty 1

## 2016-10-19 NOTE — ED Notes (Signed)
Patient transported to X-ray 

## 2016-10-19 NOTE — Discharge Instructions (Signed)
Follow up with your doctor for a refill of your oxycodone. Return to the ED if you develop new or worsening symptoms.

## 2016-10-19 NOTE — ED Notes (Signed)
PT states understanding of care given, follow up care, and medication prescribed. PT ambulated from ED to car with a steady gait. 
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
## 2016-10-19 NOTE — ED Triage Notes (Signed)
Pt reports arthritis in R shoulder aggrevating her tonight, states she hasn't been able to sleep d/t pain. States hx of cortisone shorts by orthopedic MD - unsure of time of last injection. Denies injury/falls.

## 2016-10-20 DIAGNOSIS — F331 Major depressive disorder, recurrent, moderate: Secondary | ICD-10-CM | POA: Diagnosis not present

## 2016-10-22 DIAGNOSIS — M170 Bilateral primary osteoarthritis of knee: Secondary | ICD-10-CM | POA: Diagnosis not present

## 2016-10-29 DIAGNOSIS — R300 Dysuria: Secondary | ICD-10-CM | POA: Diagnosis not present

## 2016-10-29 DIAGNOSIS — F331 Major depressive disorder, recurrent, moderate: Secondary | ICD-10-CM | POA: Diagnosis not present

## 2016-11-03 DIAGNOSIS — Z23 Encounter for immunization: Secondary | ICD-10-CM | POA: Diagnosis not present

## 2016-11-03 DIAGNOSIS — L298 Other pruritus: Secondary | ICD-10-CM | POA: Diagnosis not present

## 2016-11-03 DIAGNOSIS — R350 Frequency of micturition: Secondary | ICD-10-CM | POA: Diagnosis not present

## 2016-11-04 ENCOUNTER — Ambulatory Visit (INDEPENDENT_AMBULATORY_CARE_PROVIDER_SITE_OTHER): Payer: Medicare Other | Admitting: Podiatry

## 2016-11-04 DIAGNOSIS — M79676 Pain in unspecified toe(s): Secondary | ICD-10-CM

## 2016-11-04 DIAGNOSIS — E0842 Diabetes mellitus due to underlying condition with diabetic polyneuropathy: Secondary | ICD-10-CM

## 2016-11-04 DIAGNOSIS — B351 Tinea unguium: Secondary | ICD-10-CM | POA: Diagnosis not present
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
## 2016-11-10 ENCOUNTER — Other Ambulatory Visit: Payer: Self-pay | Admitting: Internal Medicine

## 2016-11-10 DIAGNOSIS — Z7984 Long term (current) use of oral hypoglycemic drugs: Secondary | ICD-10-CM | POA: Diagnosis not present

## 2016-11-10 DIAGNOSIS — E1140 Type 2 diabetes mellitus with diabetic neuropathy, unspecified: Secondary | ICD-10-CM | POA: Diagnosis not present

## 2016-11-10 DIAGNOSIS — M25512 Pain in left shoulder: Secondary | ICD-10-CM | POA: Diagnosis not present

## 2016-11-10 DIAGNOSIS — K219 Gastro-esophageal reflux disease without esophagitis: Secondary | ICD-10-CM | POA: Diagnosis not present

## 2016-11-10 DIAGNOSIS — M1990 Unspecified osteoarthritis, unspecified site: Secondary | ICD-10-CM | POA: Diagnosis not present

## 2016-11-10 DIAGNOSIS — I10 Essential (primary) hypertension: Secondary | ICD-10-CM | POA: Diagnosis not present

## 2016-11-10 DIAGNOSIS — F324 Major depressive disorder, single episode, in partial remission: Secondary | ICD-10-CM | POA: Diagnosis not present

## 2016-11-10 DIAGNOSIS — M8580 Other specified disorders of bone density and structure, unspecified site: Secondary | ICD-10-CM | POA: Diagnosis not present

## 2016-11-10 DIAGNOSIS — Z1239 Encounter for other screening for malignant neoplasm of breast: Secondary | ICD-10-CM | POA: Diagnosis not present

## 2016-11-10 DIAGNOSIS — I700 Atherosclerosis of aorta: Secondary | ICD-10-CM | POA: Diagnosis not present

## 2016-11-10 DIAGNOSIS — J449 Chronic obstructive pulmonary disease, unspecified: Secondary | ICD-10-CM | POA: Diagnosis not present

## 2016-11-10 DIAGNOSIS — Z1231 Encounter for screening mammogram for malignant neoplasm of breast: Secondary | ICD-10-CM

## 2016-11-10 DIAGNOSIS — F17210 Nicotine dependence, cigarettes, uncomplicated: Secondary | ICD-10-CM | POA: Diagnosis not present

## 2016-11-14 DIAGNOSIS — J309 Allergic rhinitis, unspecified: Secondary | ICD-10-CM | POA: Diagnosis not present

## 2016-11-15 ENCOUNTER — Emergency Department (HOSPITAL_COMMUNITY): Payer: Medicare Other

## 2016-11-15 ENCOUNTER — Emergency Department (HOSPITAL_COMMUNITY)
Admission: EM | Admit: 2016-11-15 | Discharge: 2016-11-15 | Disposition: A | Discharge status: Home / Self Care | Payer: Medicare Other | Attending: Emergency Medicine | Admitting: Emergency Medicine

## 2016-11-15 ENCOUNTER — Encounter (HOSPITAL_COMMUNITY): Payer: Self-pay

## 2016-11-15 DIAGNOSIS — I10 Essential (primary) hypertension: Secondary | ICD-10-CM | POA: Diagnosis not present

## 2016-11-15 DIAGNOSIS — J45909 Unspecified asthma, uncomplicated: Secondary | ICD-10-CM | POA: Insufficient documentation

## 2016-11-15 DIAGNOSIS — E119 Type 2 diabetes mellitus without complications: Secondary | ICD-10-CM | POA: Insufficient documentation

## 2016-11-15 DIAGNOSIS — J029 Acute pharyngitis, unspecified: Secondary | ICD-10-CM | POA: Insufficient documentation

## 2016-11-15 DIAGNOSIS — F329 Major depressive disorder, single episode, unspecified: Secondary | ICD-10-CM | POA: Insufficient documentation

## 2016-11-15 DIAGNOSIS — J069 Acute upper respiratory infection, unspecified: Secondary | ICD-10-CM | POA: Insufficient documentation

## 2016-11-15 DIAGNOSIS — Z7984 Long term (current) use of oral hypoglycemic drugs: Secondary | ICD-10-CM | POA: Insufficient documentation

## 2016-11-15 DIAGNOSIS — R05 Cough: Secondary | ICD-10-CM | POA: Diagnosis not present

## 2016-11-15 DIAGNOSIS — J449 Chronic obstructive pulmonary disease, unspecified: Secondary | ICD-10-CM | POA: Insufficient documentation

## 2016-11-15 DIAGNOSIS — F17210 Nicotine dependence, cigarettes, uncomplicated: Secondary | ICD-10-CM | POA: Insufficient documentation

## 2016-11-15 DIAGNOSIS — Z79899 Other long term (current) drug therapy: Secondary | ICD-10-CM | POA: Diagnosis not present

## 2016-11-15 DIAGNOSIS — R0981 Nasal congestion: Secondary | ICD-10-CM | POA: Diagnosis present

## 2016-11-15 LAB — URINALYSIS, ROUTINE W REFLEX MICROSCOPIC
Bilirubin Urine: NEGATIVE
Glucose, UA: NEGATIVE mg/dL
Hgb urine dipstick: NEGATIVE
Ketones, ur: NEGATIVE mg/dL
Nitrite: NEGATIVE
PROTEIN: 30 mg/dL — AB
Specific Gravity, Urine: 1.019 (ref 1.005–1.030)
pH: 5 (ref 5.0–8.0)

## 2016-11-15 LAB — RAPID STREP SCREEN (MED CTR MEBANE ONLY): Streptococcus, Group A Screen (Direct): NEGATIVE

## 2016-11-15 MED ORDER — ALBUTEROL SULFATE HFA 108 (90 BASE) MCG/ACT IN AERS: 1.0000 | INHALATION_SPRAY | Freq: Four times a day (QID) | RESPIRATORY_TRACT | 0 refills | Status: AC | PRN

## 2016-11-15 MED ORDER — PREDNISONE 20 MG PO TABS
40.0000 mg | ORAL_TABLET | Freq: Once | ORAL | Status: AC
  Administered 2016-11-15: 40 mg via ORAL
  Filled 2016-11-15: qty 2

## 2016-11-15 MED ORDER — PREDNISONE 20 MG PO TABS: 20.0000 mg | ORAL_TABLET | Freq: Every day | ORAL | 0 refills | Status: DC

## 2016-11-15 MED ORDER — IPRATROPIUM-ALBUTEROL 0.5-2.5 (3) MG/3ML IN SOLN
3.0000 mL | Freq: Once | RESPIRATORY_TRACT | Status: AC
  Administered 2016-11-15: 3 mL via RESPIRATORY_TRACT
  Filled 2016-11-15: qty 3

## 2016-11-15 NOTE — ED Notes (Signed)
Pt reports she was seen by [facility] urgent care and given hydrocodone cough syrup, pt reports he nose became swollen after taking medication. She reports that her doctor wanted her to come and get IV antibiotics r/t her allergies of so many medications.
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
## 2016-11-15 NOTE — Discharge Instructions (Addendum)
It was my pleasure taking care of you today!   Take prednisone daily starting tomorrow morning. Please check your sugar closely over the next week as steroids can increase your sugars! Albuterol inhaler every 4-6 hours as needed for cough or wheezing.   Please call your primary care provider tomorrow morning to schedule a follow up appointment.   Return to ER for new or worsening symptoms, any additional concerns.

## 2016-11-15 NOTE — ED Notes (Signed)
Patient unable to give urine sample at this time. Patient given urine cup.

## 2016-11-15 NOTE — ED Notes (Signed)
Pt to bedside toilet.

## 2016-11-15 NOTE — ED Triage Notes (Addendum)
Patient complains of ongoing cough, congestion and sore throat since Thursday. Seen by primary MD and diagnosed with URI and seen at [facility] yesterday and told same. Here for further evaluation. No distress, alert and oriented. No coughing on assessment. Throat red on assessment, reports fever last week. Also complains of urinary frequency

## 2016-11-15 NOTE — ED Provider Notes (Signed)
Care assumed from [facility], PA-C at shift change pending U/A and re-eval after neb. Pt presenting with congestion and productive cough. Pt with wheeze on exam, likely 2/t to COPD. CXR neg for acute infiltrate. Strep neg.  Plan to re-evaluate after neb and treat if UTI, then discharge with prednisone and albuterol.   Physical Exam  BP 109/66   Pulse 72   Temp 98.1 F (36.7 C) (Oral)   Resp 16   Ht 5\' 6"  (1.676 m)   Wt 77.1 kg (170 lb)   SpO2 95%   BMI 27.44 kg/m   Physical Exam  Constitutional: She appears well-developed and well-nourished. No distress.  HENT:  Head: Normocephalic and atraumatic.  Eyes: Conjunctivae are normal.  Cardiovascular: Normal rate.   Pulmonary/Chest: Effort normal and breath sounds normal. No respiratory distress. She has no wheezes. She has no rales.  Abdominal: Soft.  Neurological: She is alert.  Skin: Skin is warm.  Psychiatric: She has a normal mood and affect. Her behavior is normal.  Nursing note and vitals reviewed.   ED Course  Procedures  MDM On re-evaluation, lungs clear to auscultation bilaterally.  No increased work of breathing. UA unremarkable, and not consistent with UTI.  Urine culture sent.  Will discharge with prednisone and albuterol as prescribed by PA Ward.  Discussed discharge paperwork and follow-up with PCP.  Patient is safe for discharge at this time.      Robinson, [place] N, PA-C 11/15/16 2221    Pattricia Boss, MD 11/17/16 1146

## 2016-11-17 LAB — URINE CULTURE: Culture: NO GROWTH

## 2016-11-18 LAB — CULTURE, GROUP A STREP (THRC)

## 2016-11-24 DIAGNOSIS — F331 Major depressive disorder, recurrent, moderate: Secondary | ICD-10-CM | POA: Diagnosis not present

## 2016-11-27 ENCOUNTER — Ambulatory Visit
Admission: RE | Admit: 2016-11-27 | Discharge: 2016-11-27 | Disposition: A | Discharge status: Home / Self Care | Payer: Medicare Other | Source: Ambulatory Visit | Attending: Internal Medicine | Admitting: Internal Medicine

## 2016-11-27 DIAGNOSIS — Z1231 Encounter for screening mammogram for malignant neoplasm of breast: Secondary | ICD-10-CM

## 2016-11-30 ENCOUNTER — Ambulatory Visit (INDEPENDENT_AMBULATORY_CARE_PROVIDER_SITE_OTHER): Payer: Medicare Other | Admitting: Women's Health

## 2016-11-30 ENCOUNTER — Encounter: Payer: Self-pay | Admitting: Women's Health

## 2016-11-30 VITALS — BP 110/80

## 2016-11-30 DIAGNOSIS — R300 Dysuria: Secondary | ICD-10-CM

## 2016-11-30 DIAGNOSIS — N898 Other specified noninflammatory disorders of vagina: Secondary | ICD-10-CM | POA: Diagnosis not present

## 2016-11-30 LAB — WET PREP FOR TRICH, YEAST, CLUE

## 2016-11-30 NOTE — Progress Notes (Signed)
76 year old SBF G5 P3 presents with several complaints. Vaginal irritation without visible discharge, urinary frequency without pain or burning. Denies fever. States has had an upset stomach, 2-3 bouts of diarrhea daily for the last couple of days, some nausea without vomiting. Was seen in the ER for URI 11/15/2016 and was given prednisone. Also has seen primary care. Has several health problems including hypertension, COPD, diabetes. Uses a cane to ambulate. Has numerous allergies. Not sexually active. Postmenopausal on no HRT with no bleeding.  Exam: Appears older than stated years. Abdomen soft without rebound or radiation slight tenderness in epigastric area. External genitalia within normal limits, speculum exam no visible discharge or erythema, no odor noted, wet prep negative. UA: +2 glucose, trace leukocytes, negative nitrites, 6-10 WBCs, few bacteria.  Vaginal irritation. Possible GI virus  Plan: Urine culture pending. Encouraged to apply A and D ointment externally, increase plate fluids, avoid dairy products and spicy foods. Over-the-counter Pepto-Bismol if symptoms persist. Reviewed urine does not appear to be a UTI , no visible yeast or bacteria  on wet prep discussed. Instructed to follow-up with primary care if continued GI symptoms persist.

## 2016-11-30 NOTE — Patient Instructions (Signed)
Gastritis, Adult  Gastritis is swelling (inflammation) of the stomach. When you have this condition, you can have these problems (symptoms):  ? Pain in your stomach.  ? A burning feeling in your stomach.  ? Feeling sick to your stomach (nauseous).  ? Throwing up (vomiting).  ? Feeling too full after you eat.  It is important to get help for this condition. Without help, your stomach can bleed, and you can get sores (ulcers) in your stomach.  Follow these instructions at home:  ? Take over-the-counter and prescription medicines only as told by your doctor.  ? If you were prescribed an antibiotic medicine, take it as told by your doctor. Do not stop taking it even if you start to feel better.  ? Drink enough fluid to keep your pee (urine) clear or pale yellow.  ? Instead of eating big meals, eat small meals often.  Contact a health care provider if:  ? Your problems get worse.  ? Your problems go away and then come back.  Get help right away if:  ? You throw up blood or something that looks like coffee grounds.  ? You have black or dark red poop (stools).  ? You cannot keep fluids down.  ? Your stomach pain gets worse.  ? You have a fever.  ? You do not feel better after 1 week.  This information is not intended to replace advice given to you by your health care provider. Make sure you discuss any questions you have with your health care provider.  Document Released: 06/24/2007 Document Revised: 09/04/2015 Document Reviewed: 09/29/2014  Elsevier Interactive Patient Education ? 2018 [employer]

## 2016-12-02 LAB — URINALYSIS W MICROSCOPIC + REFLEX CULTURE
Bilirubin Urine: NEGATIVE
HGB URINE DIPSTICK: NEGATIVE
Hyaline Cast: NONE SEEN /LPF
Ketones, ur: NEGATIVE
NITRITES URINE, INITIAL: NEGATIVE
PH: 7 (ref 5.0–8.0)
Protein, ur: NEGATIVE
RBC / HPF: NONE SEEN /HPF (ref 0–2)
Specific Gravity, Urine: 1.01 (ref 1.001–1.03)

## 2016-12-02 LAB — URINE CULTURE
MICRO NUMBER: 81277264
SPECIMEN QUALITY: ADEQUATE

## 2016-12-02 LAB — CULTURE INDICATED

## 2016-12-03 DIAGNOSIS — K219 Gastro-esophageal reflux disease without esophagitis: Secondary | ICD-10-CM | POA: Diagnosis not present

## 2016-12-03 DIAGNOSIS — D72829 Elevated white blood cell count, unspecified: Secondary | ICD-10-CM | POA: Diagnosis not present

## 2016-12-03 DIAGNOSIS — R109 Unspecified abdominal pain: Secondary | ICD-10-CM | POA: Diagnosis not present

## 2016-12-08 DIAGNOSIS — M1711 Unilateral primary osteoarthritis, right knee: Secondary | ICD-10-CM | POA: Diagnosis not present

## 2016-12-08 DIAGNOSIS — M7582 Other shoulder lesions, left shoulder: Secondary | ICD-10-CM | POA: Diagnosis not present

## 2016-12-08 DIAGNOSIS — M7581 Other shoulder lesions, right shoulder: Secondary | ICD-10-CM | POA: Diagnosis not present

## 2016-12-18 DIAGNOSIS — R1084 Generalized abdominal pain: Secondary | ICD-10-CM | POA: Diagnosis not present

## 2016-12-18 DIAGNOSIS — K219 Gastro-esophageal reflux disease without esophagitis: Secondary | ICD-10-CM | POA: Diagnosis not present

## 2016-12-18 DIAGNOSIS — R10816 Epigastric abdominal tenderness: Secondary | ICD-10-CM | POA: Diagnosis not present

## 2016-12-19 DIAGNOSIS — N179 Acute kidney failure, unspecified: Secondary | ICD-10-CM

## 2016-12-19 HISTORY — DX: Acute kidney failure, unspecified: N17.9

## 2016-12-24 ENCOUNTER — Telehealth: Payer: Self-pay | Admitting: Hematology and Oncology

## 2016-12-24 ENCOUNTER — Encounter: Payer: Self-pay | Admitting: Hematology and Oncology

## 2016-12-24 DIAGNOSIS — F331 Major depressive disorder, recurrent, moderate: Secondary | ICD-10-CM | POA: Diagnosis not present

## 2016-12-24 NOTE — Telephone Encounter (Signed)
Appt has been scheduled for the pt to see Dr. Lindi Adie on 01/25/17 at 1pm. PT aware to arrive 30 minutes early. Letter and directions mailed to the pt.

## 2016-12-25 ENCOUNTER — Inpatient Hospital Stay (HOSPITAL_COMMUNITY)
Admission: EM | Admit: 2016-12-25 | Discharge: 2016-12-30 | DRG: 682 | Disposition: A | Discharge status: Skilled Nursing Facility | Payer: Medicare Other | Attending: Internal Medicine | Admitting: Internal Medicine

## 2016-12-25 ENCOUNTER — Emergency Department (HOSPITAL_COMMUNITY): Payer: Medicare Other

## 2016-12-25 ENCOUNTER — Other Ambulatory Visit: Payer: Self-pay

## 2016-12-25 ENCOUNTER — Observation Stay (HOSPITAL_COMMUNITY): Payer: Medicare Other

## 2016-12-25 ENCOUNTER — Observation Stay (HOSPITAL_BASED_OUTPATIENT_CLINIC_OR_DEPARTMENT_OTHER): Payer: Medicare Other

## 2016-12-25 ENCOUNTER — Encounter (HOSPITAL_COMMUNITY): Payer: Self-pay

## 2016-12-25 DIAGNOSIS — J449 Chronic obstructive pulmonary disease, unspecified: Secondary | ICD-10-CM | POA: Diagnosis not present

## 2016-12-25 DIAGNOSIS — D509 Iron deficiency anemia, unspecified: Secondary | ICD-10-CM | POA: Diagnosis present

## 2016-12-25 DIAGNOSIS — J189 Pneumonia, unspecified organism: Secondary | ICD-10-CM

## 2016-12-25 DIAGNOSIS — F329 Major depressive disorder, single episode, unspecified: Secondary | ICD-10-CM | POA: Diagnosis present

## 2016-12-25 DIAGNOSIS — Z794 Long term (current) use of insulin: Secondary | ICD-10-CM | POA: Diagnosis not present

## 2016-12-25 DIAGNOSIS — I492 Junctional premature depolarization: Secondary | ICD-10-CM | POA: Diagnosis not present

## 2016-12-25 DIAGNOSIS — K8020 Calculus of gallbladder without cholecystitis without obstruction: Secondary | ICD-10-CM | POA: Diagnosis not present

## 2016-12-25 DIAGNOSIS — Z7984 Long term (current) use of oral hypoglycemic drugs: Secondary | ICD-10-CM

## 2016-12-25 DIAGNOSIS — IMO0002 Reserved for concepts with insufficient information to code with codable children: Secondary | ICD-10-CM | POA: Diagnosis present

## 2016-12-25 DIAGNOSIS — Z79891 Long term (current) use of opiate analgesic: Secondary | ICD-10-CM

## 2016-12-25 DIAGNOSIS — R1011 Right upper quadrant pain: Secondary | ICD-10-CM | POA: Diagnosis present

## 2016-12-25 DIAGNOSIS — E1165 Type 2 diabetes mellitus with hyperglycemia: Secondary | ICD-10-CM | POA: Diagnosis present

## 2016-12-25 DIAGNOSIS — G92 Toxic encephalopathy: Secondary | ICD-10-CM | POA: Diagnosis not present

## 2016-12-25 DIAGNOSIS — R739 Hyperglycemia, unspecified: Secondary | ICD-10-CM

## 2016-12-25 DIAGNOSIS — F419 Anxiety disorder, unspecified: Secondary | ICD-10-CM | POA: Diagnosis present

## 2016-12-25 DIAGNOSIS — I361 Nonrheumatic tricuspid (valve) insufficiency: Secondary | ICD-10-CM | POA: Diagnosis not present

## 2016-12-25 DIAGNOSIS — E876 Hypokalemia: Secondary | ICD-10-CM | POA: Diagnosis not present

## 2016-12-25 DIAGNOSIS — R110 Nausea: Secondary | ICD-10-CM | POA: Diagnosis not present

## 2016-12-25 DIAGNOSIS — N179 Acute kidney failure, unspecified: Secondary | ICD-10-CM | POA: Diagnosis not present

## 2016-12-25 DIAGNOSIS — E1365 Other specified diabetes mellitus with hyperglycemia: Secondary | ICD-10-CM | POA: Diagnosis not present

## 2016-12-25 DIAGNOSIS — I10 Essential (primary) hypertension: Secondary | ICD-10-CM | POA: Diagnosis present

## 2016-12-25 DIAGNOSIS — T56891A Toxic effect of other metals, accidental (unintentional), initial encounter: Secondary | ICD-10-CM | POA: Diagnosis not present

## 2016-12-25 DIAGNOSIS — K8010 Calculus of gallbladder with chronic cholecystitis without obstruction: Secondary | ICD-10-CM | POA: Diagnosis not present

## 2016-12-25 DIAGNOSIS — F322 Major depressive disorder, single episode, severe without psychotic features: Secondary | ICD-10-CM | POA: Diagnosis present

## 2016-12-25 DIAGNOSIS — J45909 Unspecified asthma, uncomplicated: Secondary | ICD-10-CM | POA: Diagnosis present

## 2016-12-25 DIAGNOSIS — I4949 Other premature depolarization: Secondary | ICD-10-CM | POA: Diagnosis present

## 2016-12-25 DIAGNOSIS — G8929 Other chronic pain: Secondary | ICD-10-CM | POA: Diagnosis present

## 2016-12-25 DIAGNOSIS — R270 Ataxia, unspecified: Secondary | ICD-10-CM | POA: Insufficient documentation

## 2016-12-25 DIAGNOSIS — F17210 Nicotine dependence, cigarettes, uncomplicated: Secondary | ICD-10-CM | POA: Diagnosis present

## 2016-12-25 DIAGNOSIS — Z79899 Other long term (current) drug therapy: Secondary | ICD-10-CM

## 2016-12-25 DIAGNOSIS — R05 Cough: Secondary | ICD-10-CM | POA: Diagnosis not present

## 2016-12-25 HISTORY — DX: Acute kidney failure, unspecified: N17.9

## 2016-12-25 LAB — COMPREHENSIVE METABOLIC PANEL
ALBUMIN: 2.5 g/dL — AB (ref 3.5–5.0)
ALT: 20 U/L (ref 14–54)
AST: 18 U/L (ref 15–41)
Alkaline Phosphatase: 90 U/L (ref 38–126)
Anion gap: 16 — ABNORMAL HIGH (ref 5–15)
BUN: 21 mg/dL — AB (ref 6–20)
CALCIUM: 9.1 mg/dL (ref 8.9–10.3)
CO2: 21 mmol/L — AB (ref 22–32)
CREATININE: 1.01 mg/dL — AB (ref 0.44–1.00)
Chloride: 93 mmol/L — ABNORMAL LOW (ref 101–111)
GFR calc Af Amer: >60 mL/min (ref >60)
GFR calc non Af Amer: 53 mL/min — ABNORMAL LOW (ref >60)
GLUCOSE: 386 mg/dL — AB (ref 65–99)
Potassium: 4 mmol/L (ref 3.5–5.1)
SODIUM: 130 mmol/L — AB (ref 135–145)
Total Bilirubin: 0.7 mg/dL (ref 0.3–1.2)
Total Protein: 7.2 g/dL (ref 6.5–8.1)

## 2016-12-25 LAB — CBC
HCT: 32.6 % — ABNORMAL LOW (ref 36.0–46.0)
Hemoglobin: 11 g/dL — ABNORMAL LOW (ref 12.0–15.0)
MCH: 29.2 pg (ref 26.0–34.0)
MCHC: 33.7 g/dL (ref 30.0–36.0)
MCV: 86.5 fL (ref 78.0–100.0)
PLATELETS: 843 10*3/uL — AB (ref 150–400)
RBC: 3.77 MIL/uL — ABNORMAL LOW (ref 3.87–5.11)
RDW: 15.4 % (ref 11.5–15.5)
WBC: 24.6 10*3/uL — ABNORMAL HIGH (ref 4.0–10.5)

## 2016-12-25 LAB — URINALYSIS, ROUTINE W REFLEX MICROSCOPIC
Bilirubin Urine: NEGATIVE
GLUCOSE, UA: NEGATIVE mg/dL
Hgb urine dipstick: NEGATIVE
Ketones, ur: NEGATIVE mg/dL
Leukocytes, UA: NEGATIVE
Nitrite: NEGATIVE
PH: 6 (ref 5.0–8.0)
PROTEIN: NEGATIVE mg/dL
Specific Gravity, Urine: 1.013 (ref 1.005–1.030)

## 2016-12-25 LAB — CK: CK TOTAL: 30 U/L — AB (ref 38–234)

## 2016-12-25 LAB — PHOSPHORUS: PHOSPHORUS: 3.1 mg/dL (ref 2.5–4.6)

## 2016-12-25 LAB — I-STAT CG4 LACTIC ACID, ED: LACTIC ACID, VENOUS: 1.02 mmol/L (ref 0.5–1.9)

## 2016-12-25 LAB — HEMOGLOBIN A1C
HEMOGLOBIN A1C: 7.9 % — AB (ref 4.8–5.6)
MEAN PLASMA GLUCOSE: 180.03 mg/dL

## 2016-12-25 LAB — GLUCOSE, CAPILLARY
GLUCOSE-CAPILLARY: 225 mg/dL — AB (ref 65–99)
Glucose-Capillary: 135 mg/dL — ABNORMAL HIGH (ref 65–99)
Glucose-Capillary: 306 mg/dL — ABNORMAL HIGH (ref 65–99)

## 2016-12-25 LAB — LIPASE, BLOOD: Lipase: 21 U/L (ref 11–51)

## 2016-12-25 LAB — ECHOCARDIOGRAM COMPLETE

## 2016-12-25 LAB — TROPONIN I

## 2016-12-25 LAB — LITHIUM LEVEL: LITHIUM LVL: 0.85 mmol/L (ref 0.60–1.20)

## 2016-12-25 LAB — CBG MONITORING, ED: Glucose-Capillary: 359 mg/dL — ABNORMAL HIGH (ref 65–99)

## 2016-12-25 LAB — MAGNESIUM: MAGNESIUM: 1.4 mg/dL — AB (ref 1.7–2.4)

## 2016-12-25 LAB — TSH: TSH: 0.613 u[IU]/mL (ref 0.350–4.500)

## 2016-12-25 MED ORDER — ACETAMINOPHEN 650 MG RE SUPP: 650.0000 mg | Freq: Four times a day (QID) | RECTAL | Status: DC | PRN

## 2016-12-25 MED ORDER — INSULIN ASPART 100 UNIT/ML ~~LOC~~ SOLN: 0.0000 [IU] | Freq: Every day | SUBCUTANEOUS | Status: DC

## 2016-12-25 MED ORDER — MORPHINE SULFATE (PF) 4 MG/ML IV SOLN
INTRAVENOUS | Status: AC
  Filled 2016-12-25: qty 1

## 2016-12-25 MED ORDER — MORPHINE SULFATE (PF) 4 MG/ML IV SOLN
1.0000 mg | INTRAVENOUS | Status: DC | PRN
  Administered 2016-12-26: 2 mg via INTRAVENOUS
  Administered 2016-12-26: 4 mg via INTRAVENOUS
  Filled 2016-12-25 (×2): qty 1

## 2016-12-25 MED ORDER — TECHNETIUM TC 99M MEBROFENIN IV KIT
5.4500 | PACK | Freq: Once | INTRAVENOUS | Status: AC | PRN
  Administered 2016-12-25: 5.45 via INTRAVENOUS

## 2016-12-25 MED ORDER — PANTOPRAZOLE SODIUM 40 MG IV SOLR
40.0000 mg | Freq: Two times a day (BID) | INTRAVENOUS | Status: DC
  Administered 2016-12-25 – 2016-12-26 (×2): 40 mg via INTRAVENOUS
  Filled 2016-12-25 (×2): qty 40

## 2016-12-25 MED ORDER — SODIUM CHLORIDE 0.9 % IV BOLUS (SEPSIS)
1000.0000 mL | Freq: Once | INTRAVENOUS | Status: AC
  Administered 2016-12-25: 1000 mL via INTRAVENOUS

## 2016-12-25 MED ORDER — MOMETASONE FURO-FORMOTEROL FUM 100-5 MCG/ACT IN AERO
2.0000 | INHALATION_SPRAY | Freq: Two times a day (BID) | RESPIRATORY_TRACT | Status: DC
  Administered 2016-12-26 – 2016-12-30 (×9): 2 via RESPIRATORY_TRACT
  Filled 2016-12-25: qty 8.8

## 2016-12-25 MED ORDER — ONDANSETRON HCL 4 MG/2ML IJ SOLN: 4.0000 mg | Freq: Four times a day (QID) | INTRAMUSCULAR | Status: DC | PRN

## 2016-12-25 MED ORDER — ALPRAZOLAM 0.5 MG PO TABS
0.5000 mg | ORAL_TABLET | Freq: Two times a day (BID) | ORAL | Status: DC | PRN
  Administered 2016-12-25 – 2016-12-30 (×9): 0.5 mg via ORAL
  Filled 2016-12-25 (×10): qty 1

## 2016-12-25 MED ORDER — ENOXAPARIN SODIUM 40 MG/0.4ML ~~LOC~~ SOLN
40.0000 mg | SUBCUTANEOUS | Status: DC
  Administered 2016-12-26 – 2016-12-30 (×5): 40 mg via SUBCUTANEOUS
  Filled 2016-12-25 (×5): qty 0.4

## 2016-12-25 MED ORDER — SODIUM CHLORIDE 0.9% FLUSH
3.0000 mL | Freq: Two times a day (BID) | INTRAVENOUS | Status: DC
  Administered 2016-12-28 – 2016-12-30 (×6): 3 mL via INTRAVENOUS

## 2016-12-25 MED ORDER — INSULIN ASPART 100 UNIT/ML ~~LOC~~ SOLN
0.0000 [IU] | SUBCUTANEOUS | Status: DC
  Administered 2016-12-25: 7 [IU] via SUBCUTANEOUS
  Administered 2016-12-25: 3 [IU] via SUBCUTANEOUS
  Administered 2016-12-26: 2 [IU] via SUBCUTANEOUS
  Administered 2016-12-26 (×3): 5 [IU] via SUBCUTANEOUS
  Administered 2016-12-26: 2 [IU] via SUBCUTANEOUS
  Administered 2016-12-27: 7 [IU] via SUBCUTANEOUS
  Administered 2016-12-27: 2 [IU] via SUBCUTANEOUS
  Administered 2016-12-27: 7 [IU] via SUBCUTANEOUS
  Administered 2016-12-27 (×2): 3 [IU] via SUBCUTANEOUS
  Administered 2016-12-28: 2 [IU] via SUBCUTANEOUS
  Administered 2016-12-28: 1 [IU] via SUBCUTANEOUS
  Administered 2016-12-28: 3 [IU] via SUBCUTANEOUS
  Administered 2016-12-28: 5 [IU] via SUBCUTANEOUS
  Administered 2016-12-28 – 2016-12-29 (×4): 3 [IU] via SUBCUTANEOUS
  Administered 2016-12-29 (×2): 2 [IU] via SUBCUTANEOUS
  Administered 2016-12-29: 5 [IU] via SUBCUTANEOUS
  Administered 2016-12-29: 3 [IU] via SUBCUTANEOUS
  Administered 2016-12-30 (×5): 2 [IU] via SUBCUTANEOUS

## 2016-12-25 MED ORDER — HYDRALAZINE HCL 20 MG/ML IJ SOLN: 10.0000 mg | Freq: Four times a day (QID) | INTRAMUSCULAR | Status: DC | PRN

## 2016-12-25 MED ORDER — PROMETHAZINE HCL 25 MG/ML IJ SOLN: 6.2500 mg | Freq: Four times a day (QID) | INTRAMUSCULAR | Status: DC | PRN

## 2016-12-25 MED ORDER — SODIUM CHLORIDE 0.9 % IV SOLN
INTRAVENOUS | Status: DC
  Administered 2016-12-25 – 2016-12-27 (×3): via INTRAVENOUS

## 2016-12-25 MED ORDER — ACETAMINOPHEN 325 MG PO TABS
650.0000 mg | ORAL_TABLET | Freq: Four times a day (QID) | ORAL | Status: DC | PRN
  Administered 2016-12-26 – 2016-12-30 (×9): 650 mg via ORAL
  Filled 2016-12-25 (×9): qty 2

## 2016-12-25 MED ORDER — MAGNESIUM SULFATE 2 GM/50ML IV SOLN
2.0000 g | Freq: Once | INTRAVENOUS | Status: DC
  Filled 2016-12-25: qty 50

## 2016-12-25 MED ORDER — ONDANSETRON 4 MG PO TBDP
4.0000 mg | ORAL_TABLET | Freq: Once | ORAL | Status: AC | PRN
  Administered 2016-12-25: 4 mg via ORAL
  Filled 2016-12-25: qty 1

## 2016-12-25 MED ORDER — ALBUTEROL SULFATE (2.5 MG/3ML) 0.083% IN NEBU: 2.5000 mg | INHALATION_SOLUTION | Freq: Four times a day (QID) | RESPIRATORY_TRACT | Status: DC | PRN

## 2016-12-25 MED ORDER — MORPHINE SULFATE (PF) 4 MG/ML IV SOLN
3.0000 mg | Freq: Once | INTRAVENOUS | Status: AC
  Administered 2016-12-25: 3 mg via INTRAVENOUS

## 2016-12-25 MED ORDER — BOOST / RESOURCE BREEZE PO LIQD CUSTOM: 1.0000 | Freq: Three times a day (TID) | ORAL | Status: DC

## 2016-12-25 MED ORDER — INSULIN ASPART 100 UNIT/ML ~~LOC~~ SOLN: 0.0000 [IU] | Freq: Three times a day (TID) | SUBCUTANEOUS | Status: DC

## 2016-12-25 MED ORDER — PROMETHAZINE HCL 25 MG/ML IJ SOLN: 25.0000 mg | Freq: Four times a day (QID) | INTRAMUSCULAR | Status: DC | PRN
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
## 2016-12-25 NOTE — ED Triage Notes (Signed)
Pt coming from home BIB PTAR. Pt c/o nasuea x1 day loss of appetite. Pt states feeling a little weak. ptar took CBG it was 575.
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
## 2016-12-25 NOTE — Progress Notes (Signed)
Abdominal ultrasound revealed layering gallstones with biliary sludge with the largest calculus measuring 8 mm.  No sonographic Murphy sign.  Common bile duct 7.3 mm read as normal for age.  Given symptoms will obtain hepatobiliary scan with ejection fraction to determine if has biliary dyskinesia.  Erin Hearing, ANP

## 2016-12-25 NOTE — ED Notes (Signed)
Attempted report 

## 2016-12-25 NOTE — ED Notes (Signed)
CBG: 359

## 2016-12-26 DIAGNOSIS — F419 Anxiety disorder, unspecified: Secondary | ICD-10-CM | POA: Diagnosis present

## 2016-12-26 DIAGNOSIS — D509 Iron deficiency anemia, unspecified: Secondary | ICD-10-CM | POA: Diagnosis present

## 2016-12-26 DIAGNOSIS — E876 Hypokalemia: Secondary | ICD-10-CM | POA: Diagnosis not present

## 2016-12-26 DIAGNOSIS — R4182 Altered mental status, unspecified: Secondary | ICD-10-CM | POA: Diagnosis not present

## 2016-12-26 DIAGNOSIS — F17210 Nicotine dependence, cigarettes, uncomplicated: Secondary | ICD-10-CM | POA: Diagnosis present

## 2016-12-26 DIAGNOSIS — K8010 Calculus of gallbladder with chronic cholecystitis without obstruction: Secondary | ICD-10-CM | POA: Diagnosis present

## 2016-12-26 DIAGNOSIS — N179 Acute kidney failure, unspecified: Secondary | ICD-10-CM | POA: Diagnosis present

## 2016-12-26 DIAGNOSIS — E119 Type 2 diabetes mellitus without complications: Secondary | ICD-10-CM | POA: Diagnosis not present

## 2016-12-26 DIAGNOSIS — Z79899 Other long term (current) drug therapy: Secondary | ICD-10-CM | POA: Diagnosis not present

## 2016-12-26 DIAGNOSIS — Z79891 Long term (current) use of opiate analgesic: Secondary | ICD-10-CM | POA: Diagnosis not present

## 2016-12-26 DIAGNOSIS — G92 Toxic encephalopathy: Secondary | ICD-10-CM | POA: Diagnosis present

## 2016-12-26 DIAGNOSIS — I10 Essential (primary) hypertension: Secondary | ICD-10-CM | POA: Diagnosis present

## 2016-12-26 DIAGNOSIS — J189 Pneumonia, unspecified organism: Secondary | ICD-10-CM | POA: Diagnosis not present

## 2016-12-26 DIAGNOSIS — E1365 Other specified diabetes mellitus with hyperglycemia: Secondary | ICD-10-CM | POA: Diagnosis not present

## 2016-12-26 DIAGNOSIS — J449 Chronic obstructive pulmonary disease, unspecified: Secondary | ICD-10-CM | POA: Diagnosis present

## 2016-12-26 DIAGNOSIS — R1011 Right upper quadrant pain: Secondary | ICD-10-CM | POA: Diagnosis not present

## 2016-12-26 DIAGNOSIS — E1165 Type 2 diabetes mellitus with hyperglycemia: Secondary | ICD-10-CM | POA: Diagnosis not present

## 2016-12-26 DIAGNOSIS — G8929 Other chronic pain: Secondary | ICD-10-CM | POA: Diagnosis not present

## 2016-12-26 DIAGNOSIS — F329 Major depressive disorder, single episode, unspecified: Secondary | ICD-10-CM | POA: Diagnosis present

## 2016-12-26 DIAGNOSIS — I492 Junctional premature depolarization: Secondary | ICD-10-CM | POA: Diagnosis present

## 2016-12-26 DIAGNOSIS — Z7984 Long term (current) use of oral hypoglycemic drugs: Secondary | ICD-10-CM | POA: Diagnosis not present

## 2016-12-26 LAB — CBC WITH DIFFERENTIAL/PLATELET
BASOS ABS: 0 10*3/uL (ref 0.0–0.1)
Basophils Relative: 0 %
EOS PCT: 0 %
Eosinophils Absolute: 0 10*3/uL (ref 0.0–0.7)
HEMATOCRIT: 31.4 % — AB (ref 36.0–46.0)
Hemoglobin: 10.3 g/dL — ABNORMAL LOW (ref 12.0–15.0)
LYMPHS PCT: 8 %
Lymphs Abs: 1.7 10*3/uL (ref 0.7–4.0)
MCH: 28.5 pg (ref 26.0–34.0)
MCHC: 32.8 g/dL (ref 30.0–36.0)
MCV: 87 fL (ref 78.0–100.0)
MONO ABS: 1.1 10*3/uL — AB (ref 0.1–1.0)
MONOS PCT: 5 %
Neutro Abs: 18.3 10*3/uL — ABNORMAL HIGH (ref 1.7–7.7)
Neutrophils Relative %: 87 %
PLATELETS: 622 10*3/uL — AB (ref 150–400)
RBC: 3.61 MIL/uL — ABNORMAL LOW (ref 3.87–5.11)
RDW: 15.2 % (ref 11.5–15.5)
WBC: 21.2 10*3/uL — ABNORMAL HIGH (ref 4.0–10.5)

## 2016-12-26 LAB — COMPREHENSIVE METABOLIC PANEL
ALBUMIN: 2.1 g/dL — AB (ref 3.5–5.0)
ALK PHOS: 84 U/L (ref 38–126)
ALT: 21 U/L (ref 14–54)
AST: 22 U/L (ref 15–41)
Anion gap: 11 (ref 5–15)
BILIRUBIN TOTAL: 0.8 mg/dL (ref 0.3–1.2)
BUN: 8 mg/dL (ref 6–20)
CO2: 21 mmol/L — AB (ref 22–32)
Calcium: 8.3 mg/dL — ABNORMAL LOW (ref 8.9–10.3)
Chloride: 102 mmol/L (ref 101–111)
Creatinine, Ser: 0.82 mg/dL (ref 0.44–1.00)
GFR calc Af Amer: >60 mL/min (ref >60)
GFR calc non Af Amer: >60 mL/min (ref >60)
GLUCOSE: 237 mg/dL — AB (ref 65–99)
POTASSIUM: 3.9 mmol/L (ref 3.5–5.1)
SODIUM: 134 mmol/L — AB (ref 135–145)
TOTAL PROTEIN: 6.2 g/dL — AB (ref 6.5–8.1)

## 2016-12-26 LAB — TROPONIN I

## 2016-12-26 LAB — GLUCOSE, CAPILLARY
GLUCOSE-CAPILLARY: 160 mg/dL — AB (ref 65–99)
GLUCOSE-CAPILLARY: 205 mg/dL — AB (ref 65–99)
Glucose-Capillary: 180 mg/dL — ABNORMAL HIGH (ref 65–99)
Glucose-Capillary: 262 mg/dL — ABNORMAL HIGH (ref 65–99)
Glucose-Capillary: 288 mg/dL — ABNORMAL HIGH (ref 65–99)
Glucose-Capillary: 292 mg/dL — ABNORMAL HIGH (ref 65–99)

## 2016-12-26 MED ORDER — PANTOPRAZOLE SODIUM 40 MG PO TBEC
40.0000 mg | DELAYED_RELEASE_TABLET | Freq: Two times a day (BID) | ORAL | Status: DC
  Administered 2016-12-26 – 2016-12-30 (×8): 40 mg via ORAL
  Filled 2016-12-26 (×8): qty 1

## 2016-12-26 MED ORDER — GLUCERNA SHAKE PO LIQD
237.0000 mL | Freq: Two times a day (BID) | ORAL | Status: DC
  Administered 2016-12-28 – 2016-12-30 (×5): 237 mL via ORAL

## 2016-12-26 MED ORDER — ENSURE ENLIVE PO LIQD: 237.0000 mL | Freq: Two times a day (BID) | ORAL | Status: DC

## 2016-12-26 MED ORDER — MUSCLE RUB 10-15 % EX CREA
TOPICAL_CREAM | CUTANEOUS | Status: DC | PRN
  Administered 2016-12-29 – 2016-12-30 (×2): via TOPICAL
  Filled 2016-12-26: qty 85

## 2016-12-26 NOTE — Progress Notes (Signed)
Initial Nutrition Assessment  DOCUMENTATION CODES:   Not applicable  INTERVENTION:   Ensure Enlive po BID, each supplement provides 350 kcal and 20 grams of protein   NUTRITION DIAGNOSIS:   Inadequate oral intake related to acute illness, poor appetite as evidenced by per patient/family report.  GOAL:   Patient will meet greater than or equal to 90% of their needs  MONITOR:   PO intake, Supplement acceptance, Weight trends, Labs  REASON FOR ASSESSMENT:   Consult, Malnutrition Screening Tool Assessment of nutrition requirement/status  ASSESSMENT:   76 yo female admitted with AKI, abdominal pain with 1 day hx of anorexia and nausea. Pt with hx of HTN, DM, severe depression, COPD (COPD gold II), diverticulitis in 2017   Pt with possible acute on chronic cholecystitis, MD holding on surgical consult at this time  Pt tolerated CL tray this AM without N/V/abdominal pain; diet just advanced to Regular.  Pt reports poor appetite for 1 week PTA; typically pt eats 2 meals per day, supplements with Ensure and typically eats night time snack of peanut butter sandwich with juice.   Pt reports 10 pound wt loss in past 1 week (5.5% weight loss in 1 week. Per weight encouters, pt weighs 169 pounds today, pt weighed 169 pounds in March of this year. Noted weights from 2017 in 190s (11% weight loss).  Pt does not meet clinical characteristics for malnutrition based on information obtained today during assessment. Will continue to assess Labs: sodium 130, Creatinine 1.01, BUN 20, CBGs 135-359, HgbA1c 7.9 Meds: NS at 100 ml/hr   NUTRITION - FOCUSED PHYSICAL EXAM:    Most Recent Value  Orbital Region  No depletion  Upper Arm Region  No depletion  Thoracic and Lumbar Region  No depletion  Buccal Region  No depletion  Temple Region  Mild depletion  Clavicle Bone Region  Mild depletion  Clavicle and Acromion Bone Region  Mild depletion  Scapular Bone Region  Mild depletion  Dorsal Hand   No depletion  Patellar Region  Mild depletion  Anterior Thigh Region  Moderate depletion  Posterior Calf Region  Moderate depletion  Edema (RD Assessment)  None  Hair  Reviewed  Eyes  Reviewed  Mouth  Reviewed  Skin  Reviewed  Nails  Reviewed       Diet Order:  Diet regular Room service appropriate? Yes; Fluid consistency: Thin  EDUCATION NEEDS:   No education needs have been identified at this time  Skin:  Skin Assessment: Reviewed RN Assessment  Last BM:  12/5  Height:   Ht Readings from Last 1 Encounters:  12/25/16 5\' 5"  (1.651 m)    Weight:   Wt Readings from Last 1 Encounters:  12/26/16 169 lb 8.5 oz (76.9 kg)    Ideal Body Weight:     BMI:  Body mass index is 28.21 kg/m.  Estimated Nutritional Needs:   Kcal:  1500-1700 kcals  Protein:  77-85 g  Fluid:  >/= 1.7 L  Kerman Passey MS, RD, LDN, CNSC [phone] Pager  [phone] Weekend/On-Call Pager

## 2016-12-26 NOTE — Evaluation (Signed)
Physical Therapy Evaluation Patient Details Name: Leslie Gamble MRN: [id number] DOB: 1940-10-26 Today's Date: 12/26/2016   History of Present Illness  Pt is a 76 y.o. female with medical history significant for diabetes mellitus 2 on oral agents including metformin, asthma, apparent chronic pain on oxycodone, and severe depression recently started on lithium (in addition to prior Cymbalta) in the past 1 month.  She presented to the ER reporting 24 hours of nausea without vomiting, no diarrhea, anorexia and generalized weakness. Pt admitted with dx of acute kidney injury.  Clinical Impression  Pt admitted with above diagnosis. Pt currently with functional limitations due to the deficits listed below (see PT Problem List). On eval, pt required min assist bed mobility, mod assist sit to stand, and min assist ambulation 10 feet with RW. Gait distance limited by weakness. Pt on 2 L O2 upon arrival with SpO2 91%. On RA, pt desat to 81%. Sats returned to 90% after 1-minute recovery on RA. Pt will benefit from skilled PT to increase their independence and safety with mobility to allow discharge to the venue listed below.  Pt lives alone. PTA she was independent with all functional mobility, including ambulation without AD. She has 4 steps to enter her house. She reports no family available to provide 24-hour assist.      Follow Up Recommendations SNF    Equipment Recommendations  Rolling walker with 5" wheels    Recommendations for Other Services       Precautions / Restrictions Precautions Precautions: Fall      Mobility  Bed Mobility Overal bed mobility: Needs Assistance Bed Mobility: Supine to Sit;Sit to Supine     Supine to sit: Min assist;HOB elevated Sit to supine: Min assist;HOB elevated   General bed mobility comments: +rail, increased time and effort  Transfers Overall transfer level: Needs assistance Equipment used: Rolling walker (2 wheeled) Transfers: Sit to/from  Stand Sit to Stand: Mod assist         General transfer comment: verbal cues for hand placement, assist to power up and stabilize initial standing balance  Ambulation/Gait Ambulation/Gait assistance: Min assist Ambulation Distance (Feet): 10 Feet Assistive device: Rolling walker (2 wheeled) Gait Pattern/deviations: Step-through pattern;Decreased stride length;Trunk flexed Gait velocity: decreased   General Gait Details: verbal cues for posture, unsteady gait, distance limited by weakness  Stairs            Wheelchair Mobility    Modified Rankin (Stroke Patients Only)       Balance Overall balance assessment: Needs assistance Sitting-balance support: No upper extremity supported;Feet supported Sitting balance-Leahy Scale: Good     Standing balance support: Bilateral upper extremity supported;During functional activity Standing balance-Leahy Scale: Poor Standing balance comment: heavy reliance on RW                             Pertinent Vitals/Pain Pain Assessment: Faces Faces Pain Scale: Hurts a little bit Pain Location: abdomen Pain Descriptors / Indicators: Sore Pain Intervention(s): Monitored during session;Limited activity within patient's tolerance    Home Living Family/patient expects to be discharged to:: Private residence Living Arrangements: Alone Available Help at Discharge: Family;Available PRN/intermittently Type of Home: House Home Access: Stairs to enter Entrance Stairs-Rails: Left Entrance Stairs-Number of Steps: 4 Home Layout: One level Home Equipment: None      Prior Function Level of Independence: Independent               Hand Dominance  Extremity/Trunk Assessment   Upper Extremity Assessment Upper Extremity Assessment: Defer to OT evaluation    Lower Extremity Assessment Lower Extremity Assessment: Generalized weakness    Cervical / Trunk Assessment Cervical / Trunk Assessment: Kyphotic   Communication   Communication: No difficulties  Cognition Arousal/Alertness: Awake/alert Behavior During Therapy: Flat affect Overall Cognitive Status: Within Functional Limits for tasks assessed                                        General Comments      Exercises     Assessment/Plan    PT Assessment Patient needs continued PT services  PT Problem List Decreased strength;Decreased mobility;Decreased activity tolerance;Cardiopulmonary status limiting activity;Pain;Decreased balance       PT Treatment Interventions DME instruction;Therapeutic activities;Gait training;Therapeutic exercise;Patient/family education;Balance training;Stair training;Functional mobility training    PT Goals (Current goals can be found in the Care Plan section)  Acute Rehab PT Goals Patient Stated Goal: home PT Goal Formulation: With patient Time For Goal Achievement: 01/09/17 Potential to Achieve Goals: Good    Frequency Min 3X/week   Barriers to discharge Decreased caregiver support      Co-evaluation               AM-PAC PT "6 Clicks" Daily Activity  Outcome Measure Difficulty turning over in bed (including adjusting bedclothes, sheets and blankets)?: A Lot Difficulty moving from lying on back to sitting on the side of the bed? : Unable Difficulty sitting down on and standing up from a chair with arms (e.g., wheelchair, bedside commode, etc,.)?: Unable Help needed moving to and from a bed to chair (including a wheelchair)?: A Little Help needed walking in hospital room?: A Little Help needed climbing 3-5 steps with a railing? : A Lot 6 Click Score: 12    End of Session Equipment Utilized During Treatment: Gait belt;Oxygen Activity Tolerance: Patient limited by fatigue Patient left: in bed;with call bell/phone within reach;with bed alarm set Nurse Communication: Mobility status PT Visit Diagnosis: Muscle weakness (generalized) (M62.81);Difficulty in walking, not  elsewhere classified (R26.2)    Time: 3570-1779 PT Time Calculation (min) (ACUTE ONLY): 32 min   Charges:   PT Evaluation $PT Eval Moderate Complexity: 1 Mod PT Treatments $Gait Training: 8-22 mins   PT G Codes:        Lorrin Goodell, PT  Office # [phone] Pager [phone]   Lorriane Shire 12/26/2016, 5:08 PM

## 2016-12-26 NOTE — Progress Notes (Addendum)
PROGRESS NOTE    Leslie Gamble  PIR:[id number] DOB: 01-Apr-1940 DOA: 12/25/2016 PCP: Wenda Low, MD   Specialists:     Brief Narrative:  82 female Prior diverticulitis and bleed 04/2015 htn Dm ty ii Asthma bipolar tob abuse COPD gold II Lumbar spondylosis s/p surgery  Admit 12/25/16 with  Sodium 130, Mild aki 20/1.01, LFT wnl WBC 24  Assessment & Plan:   Principal Problem:   Acute kidney injury ([place]) Active Problems:   Diabetes mellitus type 2, uncontrolled ([place])   Asthma   Severe depression ([place])   Junctional escape rhythm   RUQ abdominal pain   Chronic pain   Hypomagnesemia   ? Acute / chr cholecysitis NM scan  + for chr cholecystitis Is not tender in RUQ Would hold on Gen surg consult for now as no lft elevation and no other specific findings Start diet and observe if n/v abd pain--if + fopr pain will nee dGen surgery input  Leukocytosis Unclear cause ? GB issues UA neg CXr neg woud observe w/o Abx for now--Low threshold to start if f  DM ty ii Hold liragluide 18 ng and metfromin 100bid-place on SSI-sugars 180-237_0 sugars were 500 pta might need home mediaction management as family states she live alone  COPD gold stg II Cont albuterol 1-2 q 6 prn, dulera 2 puff bid  Htn Holding hyzaar daily as mild aki Cont amlodipine 10 q am  Bipolar/toxic met ecenphalopathy Cont xanax 0.5 bid and cymbalta 30 daily Was on Lithium started recently in nov-some concern from famiyl elevated lithium levels could have caused confusion over past week   Chr pain Cont methocarbamol 500 bid when able--holding Naprocen Cut back oxycodone 10-->5 q8 from 6 prn Was started in October by orthopedics  Fe def anemia Cont ferrous sulp with breakfast   DVT prophylaxis: lovenox Code Status:  full Family Communication:  D/w famiyl +  Disposition Plan: inpatient   Consultants:     Procedures:     Antimicrobials:       Subjective: Awake alert in nad tol  celar without issue Mor ealert oreitned per family in nad  Objective: Vitals:   12/25/16 2054 12/25/16 2059 12/26/16 0446 12/26/16 0842  BP: (!) 135/58  132/60   Pulse: 78  79 80  Resp: _1 Temp: 99 F (37.2 C)  98.3 F (36.8 C)   TempSrc: Oral     SpO2: (!) 86% 96% 95% 97%  Weight:   76.9 kg (169 lb 8.5 oz)   Height:        Intake/Output Summary (Last 24 hours) at 12/26/2016 1158 Last data filed at 12/26/2016 0600 Gross per 24 hour  Intake 1410 ml  Output 600 ml  Net 810 ml   Filed Weights   12/26/16 0446  Weight: 76.9 kg (169 lb 8.5 oz)    Examination:  eomi ncat s1 s2 no m/r/g abd soft nt nd no rebound no guard cta b resp clear throat soft and suple Neuro intact mild tremor   Data Reviewed: I have personally reviewed following labs and imaging studies  CBC: Recent Labs  Lab 12/25/16 0725 12/26/16 0925  WBC 24.6* 21.2*  NEUTROABS  --  18.3*  HGB 11.0* 10.3*  HCT 32.6* 31.4*  MCV 86.5 87.0  PLT 843* 630*   Basic Metabolic Panel: Recent Labs  Lab 12/25/16 0725 12/25/16 1152 12/26/16 0925  NA 130*  --  134*  K 4.0  --  3.9  CL 93*  --  102  CO2 21*  --  21*  GLUCOSE 386*  --  237*  BUN 21*  --  8  CREATININE 1.01*  --  0.82  CALCIUM 9.1  --  8.3*  MG  --  1.4*  --   PHOS  --  3.1  --    GFR: Estimated Creatinine Clearance: 61.8 mL/min (by C-G formula based on SCr of 0.82 mg/dL). Liver Function Tests: Recent Labs  Lab 12/25/16 0725 12/26/16 0925  AST 18 22  ALT 20 21  ALKPHOS 90 84  BILITOT 0.7 0.8  PROT 7.2 6.2*  ALBUMIN 2.5* 2.1*   Recent Labs  Lab 12/25/16 0725  LIPASE 21   No results for input(s): AMMONIA in the last 168 hours. Coagulation Profile: No results for input(s): INR, PROTIME in the last 168 hours. Cardiac Enzymes: Recent Labs  Lab 12/25/16 1152 12/25/16 1922 12/26/16 0052  CKTOTAL 30*  --   --   TROPONINI <0.03 <0.03 <0.03   BNP (last 3 results) No results for input(s): PROBNP in the last 8760  hours. HbA1C: Recent Labs    12/25/16 1152  HGBA1C 7.9*   CBG: Recent Labs  Lab 12/25/16 1706 12/25/16 2053 12/25/16 2358 12/26/16 0442 12/26/16 0744  GLUCAP 306* 225* 135* 160* 180*   Lipid Profile: No results for input(s): CHOL, HDL, LDLCALC, TRIG, CHOLHDL, LDLDIRECT in the last 72 hours. Thyroid Function Tests: Recent Labs    12/25/16 1152  TSH 0.613   Anemia Panel: No results for input(s): VITAMINB12, FOLATE, FERRITIN, TIBC, IRON, RETICCTPCT in the last 72 hours. Urine analysis:    Component Value Date/Time   COLORURINE YELLOW 12/25/2016 0755   APPEARANCEUR CLEAR 12/25/2016 0755   LABSPEC 1.013 12/25/2016 0755   PHURINE 6.0 12/25/2016 0755   GLUCOSEU NEGATIVE 12/25/2016 0755   GLUCOSEU NEGATIVE 08/31/2012 0836   HGBUR NEGATIVE 12/25/2016 0755   BILIRUBINUR NEGATIVE 12/25/2016 0755   KETONESUR NEGATIVE 12/25/2016 0755   PROTEINUR NEGATIVE 12/25/2016 0755   UROBILINOGEN 0.2 05/31/2014 1402   NITRITE NEGATIVE 12/25/2016 0755   LEUKOCYTESUR NEGATIVE 12/25/2016 0755     Radiology Studies: Reviewed images personally in health database    Scheduled Meds: . enoxaparin (LOVENOX) injection  40 mg Subcutaneous Q24H  . feeding supplement  1 Container Oral TID BM  . insulin aspart  0-9 Units Subcutaneous Q4H  . mometasone-formoterol  2 puff Inhalation BID  . pantoprazole (PROTONIX) IV  40 mg Intravenous Q12H  . sodium chloride flush  3 mL Intravenous Q12H   Continuous Infusions: . sodium chloride 100 mL/hr at 12/25/16 1700  . magnesium sulfate 1 - 4 g bolus IVPB Stopped (12/25/16 1300)     LOS: 0 days    Time spent: 35    Jai , MD [facility] ([phone]) [phone]   If 7PM-7AM, please contact night-coverage [email or web address] Password [email or web address] 12/26/2016, 11:58 AM    

## 2016-12-27 ENCOUNTER — Inpatient Hospital Stay (HOSPITAL_COMMUNITY): Payer: Medicare Other

## 2016-12-27 LAB — GLUCOSE, CAPILLARY
GLUCOSE-CAPILLARY: 314 mg/dL — AB (ref 65–99)
Glucose-Capillary: 134 mg/dL — ABNORMAL HIGH (ref 65–99)
Glucose-Capillary: 181 mg/dL — ABNORMAL HIGH (ref 65–99)
Glucose-Capillary: 246 mg/dL — ABNORMAL HIGH (ref 65–99)
Glucose-Capillary: 326 mg/dL — ABNORMAL HIGH (ref 65–99)

## 2016-12-27 LAB — BASIC METABOLIC PANEL
ANION GAP: 9 (ref 5–15)
BUN: 7 mg/dL (ref 6–20)
CO2: 25 mmol/L (ref 22–32)
Calcium: 8.2 mg/dL — ABNORMAL LOW (ref 8.9–10.3)
Chloride: 104 mmol/L (ref 101–111)
Creatinine, Ser: 0.72 mg/dL (ref 0.44–1.00)
GFR calc Af Amer: >60 mL/min (ref >60)
Glucose, Bld: 150 mg/dL — ABNORMAL HIGH (ref 65–99)
POTASSIUM: 3.2 mmol/L — AB (ref 3.5–5.1)
SODIUM: 138 mmol/L (ref 135–145)

## 2016-12-27 LAB — CBC WITH DIFFERENTIAL/PLATELET
BASOS ABS: 0 10*3/uL (ref 0.0–0.1)
Basophils Relative: 0 %
EOS ABS: 0 10*3/uL (ref 0.0–0.7)
EOS PCT: 0 %
HCT: 28.7 % — ABNORMAL LOW (ref 36.0–46.0)
Hemoglobin: 9.2 g/dL — ABNORMAL LOW (ref 12.0–15.0)
LYMPHS PCT: 12 %
Lymphs Abs: 2.2 10*3/uL (ref 0.7–4.0)
MCH: 28.2 pg (ref 26.0–34.0)
MCHC: 32.1 g/dL (ref 30.0–36.0)
MCV: 88 fL (ref 78.0–100.0)
Monocytes Absolute: 1 10*3/uL (ref 0.1–1.0)
Monocytes Relative: 5 %
Neutro Abs: 15.3 10*3/uL — ABNORMAL HIGH (ref 1.7–7.7)
Neutrophils Relative %: 83 %
PLATELETS: 566 10*3/uL — AB (ref 150–400)
RBC: 3.26 MIL/uL — AB (ref 3.87–5.11)
RDW: 15.6 % — ABNORMAL HIGH (ref 11.5–15.5)
WBC: 18.5 10*3/uL — AB (ref 4.0–10.5)

## 2016-12-27 MED ORDER — ZOLPIDEM TARTRATE 5 MG PO TABS
5.0000 mg | ORAL_TABLET | Freq: Every evening | ORAL | Status: DC | PRN
  Administered 2016-12-27 – 2016-12-29 (×3): 5 mg via ORAL
  Filled 2016-12-27 (×3): qty 1

## 2016-12-27 MED ORDER — POLYETHYLENE GLYCOL 3350 17 G PO PACK
17.0000 g | PACK | Freq: Every day | ORAL | Status: DC
  Administered 2016-12-27 – 2016-12-29 (×4): 17 g via ORAL
  Filled 2016-12-27 (×5): qty 1

## 2016-12-27 MED ORDER — ORAL CARE MOUTH RINSE
15.0000 mL | Freq: Two times a day (BID) | OROMUCOSAL | Status: DC
  Administered 2016-12-28 – 2016-12-30 (×4): 15 mL via OROMUCOSAL

## 2016-12-27 NOTE — Progress Notes (Signed)
PROGRESS NOTE    Leslie Gamble  UYQ:[id number] DOB: Nov 05, 1940 DOA: 12/25/2016 PCP: Wenda Low, MD   Specialists:     Brief Narrative:  64 female Prior diverticulitis and bleed 04/2015 htn Dm ty ii Asthma bipolar tob abuse COPD gold II Lumbar spondylosis s/p surgery  Admit 12/25/16 with  Sodium 130, Mild aki 20/1.01, LFT wnl WBC 24  Assessment & Plan:   Principal Problem:   Acute kidney injury ([place]) Active Problems:   Diabetes mellitus type 2, uncontrolled ([place])   Asthma   Severe depression ([place])   Junctional escape rhythm   RUQ abdominal pain   Chronic pain   Hypomagnesemia   AKI (acute kidney injury) ([place])  ? Acute / chr cholecysitis NM scan  + for chr cholecystitis Is not tender in RUQ Would hold on Gen surg consult for now as no lft elevation and no other specific findings Start diet and observe if n/v abd pain--if + for pain will need Gen surgery input  Leukocytosis Unclear cause ? GB issues UA neg CXR ordered for 12/9 as cough and persisting Leuk Start abx if concern  DM ty ii Hold liragluide 18 ng and metfromin 100 bid-place on SSI-sugars  sugars were 500 pta might need home mediaction management as family states she live alone  COPD gold stg II Cont albuterol 1-2 q 6 prn, dulera 2 puff bid  Htn Holding hyzaar daily as mild aki Cont amlodipine 10 q am  Bipolar/toxic met ecenphalopathy Cont xanax 0.5 bid and cymbalta 30 daily Was on Lithium started recently in nov-some concern from famiyl elevated lithium levels could have caused confusion over past week   Chr pain Cont methocarbamol 500 bid when able--holding Naprocen Cut back oxycodone 10-->5 q8 from 6 prn Was started in October by orthopedics  Fe def anemia Cont ferrous sulp with breakfast   DVT prophylaxis: lovenox Code Status:  full Family Communication:  D/w patient alone Disposition Plan: inpatient   Consultants:     Procedures:     Antimicrobials:        Subjective:  Coughing green sputum No fever  More oriented in nad eating and drinking some  Objective: Vitals:   12/26/16 2136 12/27/16 0450 12/27/16 0841 12/27/16 1301  BP: 123/61 137/69  (!) 122/56  Pulse: 77 82  80  Resp: 17 18    Temp: 97.9 F (36.6 C) 99.3 F (37.4 C)  98.4 F (36.9 C)  TempSrc: Oral Oral  Oral  SpO2: 95% 95% 94% 91%  Weight:      Height:        Intake/Output Summary (Last 24 hours) at 12/27/2016 1343 Last data filed at 12/27/2016 0838 Gross per 24 hour  Intake 2916 ml  Output 1450 ml  Net 1466 ml   Filed Weights   12/26/16 0446  Weight: 76.9 kg (169 lb 8.5 oz)    Examination:  eomi ncat s1 s2 no m/r/g abd soft nt nd no rebound no guard cta b resp decreased BS in LLL fields Neuro intact mild tremor  Data Reviewed: I have personally reviewed following labs and imaging studies  CBC: Recent Labs  Lab 12/25/16 0725 12/26/16 0925 12/27/16 0536  WBC 24.6* 21.2* 18.5*  NEUTROABS  --  18.3* 15.3*  HGB 11.0* 10.3* 9.2*  HCT 32.6* 31.4* 28.7*  MCV 86.5 87.0 88.0  PLT 843* 622* 638*   Basic Metabolic Panel: Recent Labs  Lab 12/25/16 0725 12/25/16 1152 12/26/16 0925 12/27/16 0536  NA 130*  --  134* 138  K 4.0  --  3.9 3.2*  CL 93*  --  102 104  CO2 21*  --  21* 25  GLUCOSE 386*  --  237* 150*  BUN 21*  --  8 7  CREATININE 1.01*  --  0.82 0.72  CALCIUM 9.1  --  8.3* 8.2*  MG  --  1.4*  --   --   PHOS  --  3.1  --   --    GFR: Estimated Creatinine Clearance: 63.3 mL/min (by C-G formula based on SCr of 0.72 mg/dL). Liver Function Tests: Recent Labs  Lab 12/25/16 0725 12/26/16 0925  AST 18 22  ALT 20 21  ALKPHOS 90 84  BILITOT 0.7 0.8  PROT 7.2 6.2*  ALBUMIN 2.5* 2.1*   Recent Labs  Lab 12/25/16 0725  LIPASE 21   No results for input(s): AMMONIA in the last 168 hours. Coagulation Profile: No results for input(s): INR, PROTIME in the last 168 hours. Cardiac Enzymes: Recent Labs  Lab 12/25/16 1152  12/25/16 1922 12/26/16 0052  CKTOTAL 30*  --   --   TROPONINI <0.03 <0.03 <0.03   BNP (last 3 results) No results for input(s): PROBNP in the last 8760 hours. HbA1C: Recent Labs    12/25/16 1152  HGBA1C 7.9*   CBG: Recent Labs  Lab 12/26/16 2005 12/26/16 2357 12/27/16 0442 12/27/16 0852 12/27/16 1202  GLUCAP 292* 205* 134* 181* 246*   Lipid Profile: No results for input(s): CHOL, HDL, LDLCALC, TRIG, CHOLHDL, LDLDIRECT in the last 72 hours. Thyroid Function Tests: Recent Labs    12/25/16 1152  TSH 0.613   Anemia Panel: No results for input(s): VITAMINB12, FOLATE, FERRITIN, TIBC, IRON, RETICCTPCT in the last 72 hours. Urine analysis:    Component Value Date/Time   COLORURINE YELLOW 12/25/2016 0755   APPEARANCEUR CLEAR 12/25/2016 0755   LABSPEC 1.013 12/25/2016 0755   PHURINE 6.0 12/25/2016 0755   GLUCOSEU NEGATIVE 12/25/2016 0755   GLUCOSEU NEGATIVE 08/31/2012 0836   HGBUR NEGATIVE 12/25/2016 0755   BILIRUBINUR NEGATIVE 12/25/2016 0755   KETONESUR NEGATIVE 12/25/2016 0755   PROTEINUR NEGATIVE 12/25/2016 0755   UROBILINOGEN 0.2 05/31/2014 1402   NITRITE NEGATIVE 12/25/2016 0755   LEUKOCYTESUR NEGATIVE 12/25/2016 0755     Radiology Studies: Reviewed images personally in health database    Scheduled Meds: . enoxaparin (LOVENOX) injection  40 mg Subcutaneous Q24H  . feeding supplement (GLUCERNA SHAKE)  237 mL Oral BID BM  . insulin aspart  0-9 Units Subcutaneous Q4H  . mouth rinse  15 mL Mouth Rinse BID  . mometasone-formoterol  2 puff Inhalation BID  . pantoprazole  40 mg Oral BID  . polyethylene glycol  17 g Oral Daily  . sodium chloride flush  3 mL Intravenous Q12H   Continuous Infusions: . magnesium sulfate 1 - 4 g bolus IVPB Stopped (12/25/16 1300)     LOS: 1 day    Time spent: [place], MD [facility] ([phone]   If 7PM-7AM, please contact night-coverage [email or web address] Password [email or web address] 12/27/2016, 1:43 PM

## 2016-12-28 LAB — CBC WITH DIFFERENTIAL/PLATELET
BASOS ABS: 0 10*3/uL (ref 0.0–0.1)
Basophils Relative: 0 %
EOS PCT: 0 %
Eosinophils Absolute: 0 10*3/uL (ref 0.0–0.7)
HEMATOCRIT: 27.2 % — AB (ref 36.0–46.0)
Hemoglobin: 9 g/dL — ABNORMAL LOW (ref 12.0–15.0)
LYMPHS PCT: 15 %
Lymphs Abs: 2.9 10*3/uL (ref 0.7–4.0)
MCH: 28.1 pg (ref 26.0–34.0)
MCHC: 33.1 g/dL (ref 30.0–36.0)
MCV: 85 fL (ref 78.0–100.0)
MONO ABS: 1.2 10*3/uL — AB (ref 0.1–1.0)
MONOS PCT: 6 %
Neutro Abs: 15 10*3/uL — ABNORMAL HIGH (ref 1.7–7.7)
Neutrophils Relative %: 79 %
PLATELETS: 553 10*3/uL — AB (ref 150–400)
RBC: 3.2 MIL/uL — ABNORMAL LOW (ref 3.87–5.11)
RDW: 15.2 % (ref 11.5–15.5)
WBC: 19.2 10*3/uL — ABNORMAL HIGH (ref 4.0–10.5)

## 2016-12-28 LAB — GLUCOSE, CAPILLARY
GLUCOSE-CAPILLARY: 149 mg/dL — AB (ref 65–99)
GLUCOSE-CAPILLARY: 262 mg/dL — AB (ref 65–99)
GLUCOSE-CAPILLARY: 211 mg/dL — AB (ref 65–99)
Glucose-Capillary: 171 mg/dL — ABNORMAL HIGH (ref 65–99)
Glucose-Capillary: 215 mg/dL — ABNORMAL HIGH (ref 65–99)
Glucose-Capillary: 237 mg/dL — ABNORMAL HIGH (ref 65–99)
Glucose-Capillary: 239 mg/dL — ABNORMAL HIGH (ref 65–99)

## 2016-12-28 LAB — IRON AND TIBC
IRON: 22 ug/dL — AB (ref 28–170)
SATURATION RATIOS: 11 % (ref 10.4–31.8)
TIBC: 192 ug/dL — AB (ref 250–450)
UIBC: 170 ug/dL

## 2016-12-28 LAB — H PYLORI, IGM, IGG, IGA AB
H Pylori IgG: <0.8 Index Value (ref 0.00–0.79)
H. Pylogi, Iga Abs: <9 units (ref 0.0–8.9)
H. Pylogi, Igm Abs: <9 units (ref 0.0–8.9)

## 2016-12-28 LAB — FERRITIN: Ferritin: 704 ng/mL — ABNORMAL HIGH (ref 11–307)

## 2016-12-28 LAB — RETICULOCYTES
RBC.: 3.28 MIL/uL — ABNORMAL LOW (ref 3.87–5.11)
RETIC CT PCT: 1.6 % (ref 0.4–3.1)
Retic Count, Absolute: 52.5 10*3/uL (ref 19.0–186.0)

## 2016-12-28 LAB — VITAMIN B12: VITAMIN B 12: 643 pg/mL (ref 180–914)

## 2016-12-28 LAB — FOLATE: Folate: 32 ng/mL (ref >5.9)

## 2016-12-28 NOTE — Progress Notes (Signed)
SATURATION QUALIFICATIONS: (This note is used to comply with regulatory documentation for home oxygen)  Patient Saturations on Room Air at Rest = 100%  Patient Saturations on Room Air while Ambulating =97%  Patient Saturations on 0 Liters of oxygen while Ambulating N/A  Please briefly explain why patient needs home oxygen:

## 2016-12-28 NOTE — Progress Notes (Signed)
Physical Therapy Treatment Patient Details Name: Leslie Gamble MRN: [id number] DOB: 30-Aug-1940 Today's Date: 12/28/2016    History of Present Illness Pt is a 76 y.o. female with medical history significant for diabetes mellitus 2 on oral agents including metformin, asthma, apparent chronic pain on oxycodone, and severe depression recently started on lithium (in addition to prior Cymbalta) in the past 1 month.  She presented to the ER reporting 24 hours of nausea without vomiting, no diarrhea, anorexia and generalized weakness. Pt admitted with dx of acute kidney injury.    PT Comments    Patient increased gait distance but she still requires assistance to stand and her gait distance is still very limited. She had some increased pain and swelling in her right knee today. She would benefit from further skilled therapy and rehab at a SNF.    Follow Up Recommendations  SNF     Equipment Recommendations  Rolling walker with 5" wheels    Recommendations for Other Services       Precautions / Restrictions Restrictions Weight Bearing Restrictions: No    Mobility  Bed Mobility Overal bed mobility: Needs Assistance Bed Mobility: Supine to Sit;Sit to Supine     Supine to sit: Min assist;HOB elevated Sit to supine: Min assist;HOB elevated   General bed mobility comments: min a to sit up and min a to assit right lower extrmeity back to bed.   Transfers Overall transfer level: Needs assistance Equipment used: Rolling walker (2 wheeled)   Sit to Stand: Min assist         General transfer comment: Min a for strength and balance   Ambulation/Gait Ambulation/Gait assistance: Min assist Ambulation Distance (Feet): 30 Feet Assistive device: Rolling walker (2 wheeled) Gait Pattern/deviations: Step-to pattern;Decreased stance time - right     General Gait Details: Increased difficulty this sesssion weight bearing on the right side but improved the more she walked./   Stairs            Wheelchair Mobility    Modified Rankin (Stroke Patients Only)       Balance Overall balance assessment: Needs assistance Sitting-balance support: No upper extremity supported;Feet supported Sitting balance-Leahy Scale: Good     Standing balance support: Bilateral upper extremity supported;During functional activity Standing balance-Leahy Scale: Poor Standing balance comment: heavy reliance on RW                            Cognition Arousal/Alertness: Awake/alert Behavior During Therapy: Flat affect Overall Cognitive Status: Within Functional Limits for tasks assessed                                        Exercises      General Comments        Pertinent Vitals/Pain Pain Assessment: Faces Faces Pain Scale: Hurts even more Pain Location: right knee  Pain Descriptors / Indicators: Sore Pain Intervention(s): Limited activity within patient's tolerance;Monitored during session;Patient requesting pain meds-RN notified;Relaxation    Home Living                      Prior Function            PT Goals (current goals can now be found in the care plan section) Acute Rehab PT Goals Patient Stated Goal: home PT Goal Formulation: With patient Time For Goal Achievement:  01/09/17 Potential to Achieve Goals: Good Progress towards PT goals: Progressing toward goals    Frequency    Min 3X/week      PT Plan Current plan remains appropriate    Co-evaluation              AM-PAC PT "6 Clicks" Daily Activity  Outcome Measure  Difficulty turning over in bed (including adjusting bedclothes, sheets and blankets)?: A Lot Difficulty moving from lying on back to sitting on the side of the bed? : Unable Difficulty sitting down on and standing up from a chair with arms (e.g., wheelchair, bedside commode, etc,.)?: Unable Help needed moving to and from a bed to chair (including a wheelchair)?: A Little Help needed walking in  hospital room?: A Little Help needed climbing 3-5 steps with a railing? : A Lot 6 Click Score: 12    End of Session         PT Visit Diagnosis: Muscle weakness (generalized) (M62.81);Difficulty in walking, not elsewhere classified (R26.2)     Time: 0250-0300 PT Time Calculation (min) (ACUTE ONLY): 10 min  Charges:  $Gait Training: 8-22 mins                    G Codes:          Carney Living PT DPT  12/28/2016, 4:16 PM

## 2016-12-28 NOTE — Progress Notes (Signed)
PROGRESS NOTE    Leslie Gamble  FMB:[id number] DOB: July 23, 1940 DOA: 12/25/2016 PCP: Wenda Low, MD   Specialists:     Brief Narrative:  51 female Prior diverticulitis and bleed 04/2015 htn Dm ty ii Asthma bipolar tob abuse COPD gold II Lumbar spondylosis s/p surgery  Admit 12/25/16 with  Sodium 130, Mild aki 20/1.01, LFT wnl WBC 24  Assessment & Plan:   Principal Problem:   Acute kidney injury ([place]) Active Problems:   Diabetes mellitus type 2, uncontrolled ([place])   Asthma   Severe depression ([place])   Junctional escape rhythm   RUQ abdominal pain   Chronic pain   Hypomagnesemia   AKI (acute kidney injury) ([place])  unlikely Acute / chr cholecysitis NM scan  + for chr cholecystitis- not tender in RUQ-eaitng full meals Would hold on Gen surg consult for now as no lft elevation and no other specific findings Start diet and observe if n/v abd pain--if + for pain will need Gen surgery input  Leukocytosis-persistent Unclear cause unlikley? GB issues UA neg CXR ordered 12/9 neg Given persisting leukocytosis discussed with Dr. Jana Hakim who recommends initial workup platelet smear anemia panel BRC ABL and Jak 2 We will follow for the same--should not preclude patient from leaving as long as no blasts in smear and will discuss with family implications of workup  DM ty ii Hold liragluide 18 ng and metfromin 100 bid-place on SSI-sugars were 500 [id number] might need home medication management as family states she live alone  COPD gold stg II Cont albuterol 1-2 q 6 prn, dulera 2 puff bid  Htn Holding hyzaar daily as mild aki Cont amlodipine 10 q am  Bipolar/toxic met ecenphalopathy Cont xanax 0.5 bid and cymbalta 30 daily Was on Lithium started recently in nov-some concern from family elevated lithium levels could have caused confusion over past week   Chr pain Cont methocarbamol 500 bid when able--holding Naprocen Cut back oxycodone 10-->5 q8 from 6 prn Was started  in October by orthopedics  Fe def anemia Cont ferrous sulp with breakfast   DVT prophylaxis: lovenox Code Status:  full Family Communication:  D/w patient alone Disposition Plan: inpatient   Consultants:     Procedures:     Antimicrobials:       Subjective:  Awake alert peasant knee pain and shoulder pain Wants to know "whats going on with my body" No f no chills Asking if can ge tOOB    Objective: Vitals:   12/27/16 2117 12/28/16 0437 12/28/16 0500 12/28/16 0800  BP: 115/72 121/61    Pulse: 75 70    Resp: 18 20    Temp: 98.2 F (36.8 C) 99 F (37.2 C)    TempSrc: Oral Oral    SpO2: 100% 100%  92%  Weight:   77.9 kg (171 lb 11.8 oz)   Height:        Intake/Output Summary (Last 24 hours) at 12/28/2016 1329 Last data filed at 12/28/2016 0600 Gross per 24 hour  Intake -  Output 850 ml  Net -850 ml   Filed Weights   12/26/16 0446 12/28/16 0500  Weight: 76.9 kg (169 lb 8.5 oz) 77.9 kg (171 lb 11.8 oz)    Examination:  eomi ncat s1 s2 no m/r/g abd soft nt nd no rebound no guard cta b Knees slight swelling resp wnl Neuro intact mild tremor  Data Reviewed: I have personally reviewed following labs and imaging studies  CBC: Recent Labs  Lab 12/25/16 0725 12/26/16  1856 12/27/16 0536 12/28/16 0354  WBC 24.6* 21.2* 18.5* 19.2*  NEUTROABS  --  18.3* 15.3* 15.0*  HGB 11.0* 10.3* 9.2* 9.0*  HCT 32.6* 31.4* 28.7* 27.2*  MCV 86.5 87.0 88.0 85.0  PLT 843* 622* 566* 314*   Basic Metabolic Panel: Recent Labs  Lab 12/25/16 0725 12/25/16 1152 12/26/16 0925 12/27/16 0536  NA 130*  --  134* 138  K 4.0  --  3.9 3.2*  CL 93*  --  102 104  CO2 21*  --  21* 25  GLUCOSE 386*  --  237* 150*  BUN 21*  --  8 7  CREATININE 1.01*  --  0.82 0.72  CALCIUM 9.1  --  8.3* 8.2*  MG  --  1.4*  --   --   PHOS  --  3.1  --   --    GFR: Estimated Creatinine Clearance: 63.7 mL/min (by C-G formula based on SCr of 0.72 mg/dL). Liver Function Tests: Recent  Labs  Lab 12/25/16 0725 12/26/16 0925  AST 18 22  ALT 20 21  ALKPHOS 90 84  BILITOT 0.7 0.8  PROT 7.2 6.2*  ALBUMIN 2.5* 2.1*   Recent Labs  Lab 12/25/16 0725  LIPASE 21   No results for input(s): AMMONIA in the last 168 hours. Coagulation Profile: No results for input(s): INR, PROTIME in the last 168 hours. Cardiac Enzymes: Recent Labs  Lab 12/25/16 1152 12/25/16 1922 12/26/16 0052  CKTOTAL 30*  --   --   TROPONINI <0.03 <0.03 <0.03   BNP (last 3 results) No results for input(s): PROBNP in the last 8760 hours. HbA1C: No results for input(s): HGBA1C in the last 72 hours. CBG: Recent Labs  Lab 12/27/16 2031 12/28/16 0034 12/28/16 0426 12/28/16 0747 12/28/16 1151  GLUCAP 314* 215* 171* 149* 239*   Lipid Profile: No results for input(s): CHOL, HDL, LDLCALC, TRIG, CHOLHDL, LDLDIRECT in the last 72 hours. Thyroid Function Tests: No results for input(s): TSH, T4TOTAL, FREET4, T3FREE, THYROIDAB in the last 72 hours. Anemia Panel: No results for input(s): VITAMINB12, FOLATE, FERRITIN, TIBC, IRON, RETICCTPCT in the last 72 hours. Urine analysis:    Component Value Date/Time   COLORURINE YELLOW 12/25/2016 0755   APPEARANCEUR CLEAR 12/25/2016 0755   LABSPEC 1.013 12/25/2016 0755   PHURINE 6.0 12/25/2016 0755   GLUCOSEU NEGATIVE 12/25/2016 0755   GLUCOSEU NEGATIVE 08/31/2012 0836   HGBUR NEGATIVE 12/25/2016 0755   BILIRUBINUR NEGATIVE 12/25/2016 0755   KETONESUR NEGATIVE 12/25/2016 0755   PROTEINUR NEGATIVE 12/25/2016 0755   UROBILINOGEN 0.2 05/31/2014 1402   NITRITE NEGATIVE 12/25/2016 0755   LEUKOCYTESUR NEGATIVE 12/25/2016 0755     Radiology Studies: Reviewed images personally in health database    Scheduled Meds: . enoxaparin (LOVENOX) injection  40 mg Subcutaneous Q24H  . feeding supplement (GLUCERNA SHAKE)  237 mL Oral BID BM  . insulin aspart  0-9 Units Subcutaneous Q4H  . mouth rinse  15 mL Mouth Rinse BID  . mometasone-formoterol  2 puff  Inhalation BID  . pantoprazole  40 mg Oral BID  . polyethylene glycol  17 g Oral Daily  . sodium chloride flush  3 mL Intravenous Q12H   Continuous Infusions: . magnesium sulfate 1 - 4 g bolus IVPB Stopped (12/25/16 1300)     LOS: 2 days    Time spent: [place], MD [facility] (P) [phone]   If 7PM-7AM, please contact night-coverage [email or web address] Password [email or web address] 12/28/2016, 1:29 PM

## 2016-12-28 NOTE — Progress Notes (Signed)
Inpatient Diabetes Program Recommendations  AACE/ADA: New Consensus Statement on Inpatient Glycemic Control (2015)  Target Ranges:  Prepandial:   less than 140 mg/dL      Peak postprandial:   less than 180 mg/dL (1-2 hours)      Critically ill patients:  140 - 180 mg/dL   Lab Results  Component Value Date   GLUCAP 149 (H) 12/28/2016   HGBA1C 7.9 (H) 12/25/2016    Review of Glycemic Control Results for Leslie Gamble, Leslie Gamble (MRN [id number]) as of 12/28/2016 09:55  Ref. Range 12/27/2016 17:15 12/27/2016 20:31 12/28/2016 00:34 12/28/2016 04:26 12/28/2016 07:47  Glucose-Capillary Latest Ref Range: 65 - 99 mg/dL 326 (H) 314 (H) 215 (H) 171 (H) 149 (H)   Diabetes history: DM2 Outpatient Diabetes medications: Metformin 1 gm bid + Liraglutide 1.8 mg qd Current orders for Inpatient glycemic control: Novolog correction sensitive q 4 hrs  Inpatient Diabetes Program Recommendations:   -Lantus 15 units daily while home meds on hold -Change Novolog correction to tid -Add Novolog hs correction 0-5 units  Thank you, Bethena Roys E. Rhonda Vangieson, RN, MSN, CDE  Diabetes Coordinator Inpatient Glycemic Control Team Team Pager [phone] (8am-5pm) 12/28/2016 9:58 AM

## 2016-12-29 LAB — GLUCOSE, CAPILLARY
GLUCOSE-CAPILLARY: 151 mg/dL — AB (ref 65–99)
GLUCOSE-CAPILLARY: 191 mg/dL — AB (ref 65–99)
GLUCOSE-CAPILLARY: 214 mg/dL — AB (ref 65–99)
Glucose-Capillary: 192 mg/dL — ABNORMAL HIGH (ref 65–99)
Glucose-Capillary: 257 mg/dL — ABNORMAL HIGH (ref 65–99)
Glucose-Capillary: 226 mg/dL — ABNORMAL HIGH (ref 65–99)
Glucose-Capillary: 202 mg/dL — ABNORMAL HIGH (ref 65–99)
Glucose-Capillary: 217 mg/dL — ABNORMAL HIGH (ref 65–99)

## 2016-12-29 LAB — PATHOLOGIST SMEAR REVIEW

## 2016-12-29 MED ORDER — FERROUS SULFATE 325 (65 FE) MG PO TBEC: 325.0000 mg | DELAYED_RELEASE_TABLET | Freq: Every day | ORAL | Status: DC

## 2016-12-29 MED ORDER — METHOCARBAMOL 500 MG PO TABS: 500.0000 mg | ORAL_TABLET | Freq: Two times a day (BID) | ORAL | Status: DC

## 2016-12-29 MED ORDER — FERROUS SULFATE 325 (65 FE) MG PO TABS
325.0000 mg | ORAL_TABLET | Freq: Every day | ORAL | Status: DC
  Administered 2016-12-30: 325 mg via ORAL
  Filled 2016-12-29: qty 1

## 2016-12-29 MED ORDER — OXYCODONE HCL 10 MG PO TABS: 5.0000 mg | ORAL_TABLET | Freq: Four times a day (QID) | ORAL | 0 refills | Status: DC | PRN

## 2016-12-29 NOTE — Clinical Social Work Note (Signed)
Clinical Social Work Assessment  Patient Details  Name: Leslie Gamble MRN: [id number] Date of Birth: [birth year]  Date of referral:  12/29/16               Reason for consult:  Facility Placement                Permission sought to share information with:  Facility Sport and exercise psychologist, Family Supports Permission granted to share information::  Yes, Verbal Permission Granted  Name::     [employer]  Agency::     Relationship::  daughter  Contact Information:  [phone]  Housing/Transportation Living arrangements for the past 2 months:  [place] of Information:  Patient Patient Interpreter Needed:  None Criminal Activity/Legal Involvement Pertinent to Current Situation/Hospitalization:  No - Comment as needed Significant Relationships:  Adult Children, Warehouse manager, Other Family Members Lives with:  Self Do you feel safe going back to the place where you live?  Yes Need for family participation in patient care:  Yes (Comment)  Care giving concerns:  Pt lives alone and family is unable to provide support 24/7 in the home, SNF recommended for safety and improving strength.   Social Worker assessment / plan:  CSW spoke with pt at bedside to discuss discharge planning and SNF recommendation. Pt was guarded but pleasant, she lives alone and would like to return home. Pt lives alone with support from daughter in [place], sons and daughter in law in town. CSW explained short term rehab recommendations and pt seemed a little more agreeable. Pt requested that CSW call [name] in [place]. CSW spoke with [place], an attorney, who acknowledged that her mother may not want SNF but that family knows that recommendation is best. Pt daughter wanted to know when discharge would be and requested CSW email her a blank packet of facilities for her to better understand options. CSW will continue to follow and provide support and information to pt and family as  pt prepares for discharge when medically ready.   Employment status:  Retired Forensic scientist:  Medicare PT Recommendations:  [place] / Referral to community resources:  [place]  Patient/Family's Response to care:  Pt hesitant about SNF placement, but encouraged by family that SNF is best for care.   Patient/Family's Understanding of and Emotional Response to Diagnosis, Current Treatment, and Prognosis:  Pt understands current diagnosis, and treatment as evidenced by pt telling CSW what brought her in, and the work being done with PT and care team. CSW believes pt is aware of prognosis but sad about not being able to return home immediately.   Emotional Assessment Appearance:  Appears stated age Attitude/Demeanor/Rapport:  Guarded Affect (typically observed):  Guarded, Pleasant, Appropriate Orientation:  Oriented to Place, Oriented to Self, Oriented to  Time, Oriented to Situation Alcohol / Substance use:  Tobacco Use(12/09/2016 quit date) Psych involvement (Current and /or in the community):  No (Comment)  Discharge Needs  Concerns to be addressed:  Care Coordination, Discharge Planning Concerns Readmission within the last 30 days:  No Current discharge risk:  Lives alone, Dependent with Mobility Barriers to Discharge:  Continued Medical Work up, [employer], [place] 12/29/2016, 11:49 AM

## 2016-12-29 NOTE — Social Work (Signed)
CSW met with pt at bedside to discuss SNF recommendations, pt was alert and oriented but requested CSW to call daughter and speak to her about SNF options as pt expressed she feels like she could go home.  CSW will call daughter and continue to follow to provide support for discharge whenever medically appropriate.   Leslie Gamble, [place] Work [phone]

## 2016-12-29 NOTE — Discharge Summary (Signed)
Physician Discharge Summary  Leslie Gamble STM:[id number] DOB: 02-19-1940 DOA: 12/25/2016  PCP: Wenda Low, MD  Admit date: 12/25/2016 Discharge date: 12/29/2016  Time spent: 35 minutes  Recommendations for Outpatient Follow-up:  1. Need rpt cbc + diff in 2-3 weeks--if normal and work-up [jak-2 and BCR-ABL from 12/10] no need referral to Hem-onc--Forwarded as courtesy to [facility] Dr. Lorenda Hatchet 2. recommend SNF level care given chronic OA and need for mobilization--can d/c 12/11 when bed available  Discharge Diagnoses:  Principal Problem:   Acute kidney injury [facility]) Active Problems:   Diabetes mellitus type 2, uncontrolled ([place])   Asthma   Severe depression ([place])   Junctional escape rhythm   RUQ abdominal pain   Chronic pain   Hypomagnesemia   AKI (acute kidney injury) ([place])   Discharge Condition: good  Diet recommendation:  hh low salt  Filed Weights   12/26/16 0446 12/28/16 0500  Weight: 76.9 kg (169 lb 8.5 oz) 77.9 kg (171 lb 11.8 oz)    History of present illness:   76 female Prior diverticulitis and bleed 04/2015 htn Dm ty ii Asthma bipolar tob abuse COPD gold II Lumbar spondylosis s/p surgery  Admit 12/25/16 with  Sodium 130, Mild aki 20/1.01, LFT wnl WBC 24    unlikely Acute / chr cholecysitis-Leukemoid reaction NM scan  + for chr cholecystitis- not tender in RUQ-eaitng full meals Would hold on Gen surg consult for now as no lft elevation and no other specific findings Start diet and observe if n/v abd pain--if + for pain will need Gen surgery input  Leukocytosis-persistent--see below Unclear cause unlikley? GB issues UA neg CXR ordered 12/9 neg discussed with Dr. Jana Hakim 12/10 who recommends initial workup platelet smear anemia panel BRC ABL and Jak 2--given no blasts or other wrossome features, can have 2 week CBC at PCP and consider referral if any anomalies noted  DM ty ii Hold liragluide 18 ng and metfromin 100 bid-place on SSI-sugars were 500  [id number] Metformin resuemd on 12/11 as sugars slighlty higher  COPD gold stg II Cont albuterol 1-2 q 6 prn, dulera 2 puff bid  Htn Holding hyzaar daily as mild aki--would probably not resume on d/c--Adding back only losartan 12/11 at 52 Cont amlodipine 10 q am  Bipolar/toxic met ecenphalopathy Cont xanax 0.5 bid and cymbalta 30 daily Was on Lithium started recently in nov-some concern from family elevated lithium levels could have caused confusion over past week --NO lithium at this time  Chr pain Cont methocarbamol 500 bid when able--holding Naprocen Cut back oxycodone 10-->5 q8 from 6 prn Was started in October by orthopedics  Fe def anemia Cont ferrous sulp with breakfast   Discharge Exam: Vitals:   12/29/16 0906 12/29/16 1332  BP:  125/71  Pulse:  83  Resp:  18  Temp:  98.7 F (37.1 C)  SpO2: 92% 95%    General: awake alert no ditres Cardiovascular:  s1 s 2no m/r/g Respiratory: clear w/o added sound Knees not swollen Mod ROM  Discharge Instructions   Discharge Instructions    Diet - low sodium heart healthy   Complete by:  As directed    Increase activity slowly   Complete by:  As directed      Allergies as of 12/29/2016      Reactions   Avelox [moxifloxacin Hcl In Nacl] Itching, Swelling   Cephalexin    Nose swelling   Ciprofloxacin Swelling   Clindamycin Hcl Swelling   Codeine    Lip swelling  Diflucan [fluconazole] Diarrhea   Lidocaine Swelling   Metronidazole Swelling   Nitrofurantoin Monohyd Macro Swelling   Other    Magic mouthwash    Penicillins Swelling   Shrimp [shellfish Allergy] Swelling   In nose and lips   Sulfa Antibiotics Swelling   Tetracyclines & Related Swelling   Tramadol Hcl    REACTION: facial swelling, dyspnea      Medication List    STOP taking these medications   KLOR-CON SPRINKLE 8 MEQ Cpcr capsule CR Generic drug:  Potassium Chloride CR     TAKE these medications   acetaminophen 500 MG  tablet Commonly known as:  TYLENOL Take 1,000 mg by mouth every 6 (six) hours as needed for headache.   albuterol 108 (90 Base) MCG/ACT inhaler Commonly known as:  PROVENTIL HFA;VENTOLIN HFA Inhale 1-2 puffs into the lungs every 6 (six) hours as needed for wheezing or shortness of breath.   ALIGN EXTRA STRENGTH Caps Take 1 capsule by mouth daily.   ALPRAZolam 0.5 MG tablet Commonly known as:  XANAX Take 0.5 mg by mouth 2 (two) times daily as needed for anxiety.   amLODipine 10 MG tablet Commonly known as:  NORVASC Take 10 mg by mouth every morning.   cyclobenzaprine 10 MG tablet Commonly known as:  FLEXERIL Take 1 tablet (10 mg total) by mouth 2 (two) times daily as needed for muscle spasms.   diphenhydrAMINE 25 mg capsule Commonly known as:  BENADRYL Take 1 capsule (25 mg total) by mouth every 6 (six) hours as needed.   DULoxetine 30 MG capsule Commonly known as:  CYMBALTA Take 30 mg by mouth See admin instructions. Take two tablets in the morning then take one at night until Wednesday take two in the morning and two at night   ferrous sulfate 325 (65 FE) MG EC tablet Take 325 mg by mouth daily with breakfast.   liraglutide 18 MG/3ML Sopn Inject 1.8 mg into the skin daily.   losartan-hydrochlorothiazide 100-25 MG tablet Commonly known as:  HYZAAR Take 1 tablet by mouth daily at 12 noon.   metFORMIN 1000 MG tablet Commonly known as:  GLUCOPHAGE Take 1,000 mg by mouth 2 (two) times daily with a meal.   methocarbamol 500 MG tablet Commonly known as:  ROBAXIN Take 1 tablet (500 mg total) by mouth 2 (two) times daily.   mometasone-formoterol 100-5 MCG/ACT Aero Commonly known as:  DULERA Inhale 2 puffs into the lungs 2 (two) times daily.   naproxen 500 MG tablet Commonly known as:  NAPROSYN Take 1 tablet (500 mg total) by mouth 2 (two) times daily.   NICODERM CQ 14 mg/24hr patch Generic drug:  nicotine Place 14 mg onto the skin daily.   Oxycodone HCl 10 MG  Tabs Take 0.5 tablets (5 mg total) by mouth every 6 (six) hours as needed. For knee pain What changed:  how much to take   polyethylene glycol packet Commonly known as:  MIRALAX / GLYCOLAX Take 17 g by mouth daily. What changed:    when to take this  reasons to take this   terconazole 0.8 % vaginal cream Commonly known as:  TERAZOL 3 Place 1 applicator vaginally at bedtime.      Allergies  Allergen Reactions  . Avelox [Moxifloxacin Hcl In Nacl] Itching and Swelling  . Cephalexin     Nose swelling  . Ciprofloxacin Swelling  . Clindamycin Hcl Swelling  . Codeine     Lip swelling  . Diflucan [Fluconazole] Diarrhea  .  Lidocaine Swelling  . Metronidazole Swelling  . Nitrofurantoin Monohyd Macro Swelling  . Other     Magic mouthwash   . Penicillins Swelling  . Shrimp [Shellfish Allergy] Swelling    In nose and lips  . Sulfa Antibiotics Swelling  . Tetracyclines & Related Swelling  . Tramadol Hcl     REACTION: facial swelling, dyspnea      The results of significant diagnostics from this hospitalization (including imaging, microbiology, ancillary and laboratory) are listed below for reference.    Significant Diagnostic Studies: Dg Chest 2 View  Result Date: 12/27/2016 CLINICAL DATA:  76 year old female with pneumonia EXAM: CHEST  2 VIEW COMPARISON:  Prior chest x-ray 12/25/2016 FINDINGS: Stable mild cardiomegaly. Atherosclerotic calcifications again noted in the transverse aorta. The lungs remain clear. No acute osseous abnormality. IMPRESSION: 1. Stable appearance of the chest compared to 12/25/2016. No acute cardiopulmonary process. 2. Stable cardiomegaly and aortic atherosclerosis. Electronically Signed   By: Jacqulynn Cadet M.D.   On: 12/27/2016 14:34   Dg Chest 2 View  Result Date: 12/25/2016 CLINICAL DATA:  Weakness, cough. EXAM: CHEST  2 VIEW COMPARISON:  Chest x-ray dated November 15, 2016. FINDINGS: The cardiomediastinal silhouette is normal in size. Normal  pulmonary vascularity. No focal consolidation, pleural effusion, or pneumothorax. No acute osseous abnormality. IMPRESSION: No active cardiopulmonary disease. Electronically Signed   By: Titus Dubin M.D.   On: 12/25/2016 09:42   Nm Hepatobiliary Liver Func  Result Date: 12/25/2016 CLINICAL DATA:  Nausea with abnormal gallbladder ultrasound EXAM: NUCLEAR MEDICINE HEPATOBILIARY IMAGING TECHNIQUE: Sequential images of the abdomen were obtained out to 105 minutes following intravenous administration of radiopharmaceutical. At that point, the patient received 3 mg of morphine intravenously with imaging for additional 30 minutes. Frontal and right lateral projections obtained. RADIOPHARMACEUTICALS:  5.45 mCi Tc-56m Choletec IV COMPARISON:  None. FINDINGS: Liver uptake of radiotracer is normal. There is prompt visualization of small bowel indicating patency of the common bile duct. Gallbladder did not visualize until after intravenous morphine administration. Ejection fraction was not calculated due to loss of validity for ejection fraction analysis after the intravenous morphine administration. IMPRESSION: Delayed visualization of gallbladder, indicative of chronic cholecystitis. Cystic duct is patent. Ejection fraction not calculated due to loss of the validity for ejection fraction analysis after intravenous morphine administration. Common bile duct patent with evidence of prompt visualization of small bowel. Liver uptake normal. Electronically Signed   By: WLowella GripIII M.D.   On: 12/25/2016 17:11   UKoreaAbdomen Limited Ruq  Result Date: 12/25/2016 CLINICAL DATA:  Right upper quadrant pain EXAM: ULTRASOUND ABDOMEN LIMITED RIGHT UPPER QUADRANT COMPARISON:  None. FINDINGS: Gallbladder: Layering gallstones are noted with biliary sludge the largest calculus measuring approximately 8 mm. No sonographic Murphy sign noted by sonographer. Common bile duct: Diameter: 7.3 mm, normal for age. Liver: No focal  lesion identified. Mild increased echogenicity of the liver parenchyma limited in assessment by patient body habitus. Portal vein is patent on color Doppler imaging with normal direction of blood flow towards the liver. IMPRESSION: Uncomplicated cholelithiasis. Electronically Signed   By: DAshley RoyaltyM.D.   On: 12/25/2016 13:23    Microbiology: No results found for this or any previous visit (from the past 240 hour(s)).   Labs: Basic Metabolic Panel: Recent Labs  Lab 12/25/16 0725 12/25/16 1152 12/26/16 0925 12/27/16 0536  NA 130*  --  134* 138  K 4.0  --  3.9 3.2*  CL 93*  --  102 104  CO2 21*  --  21* 25  GLUCOSE 386*  --  237* 150*  BUN 21*  --  8 7  CREATININE 1.01*  --  0.82 0.72  CALCIUM 9.1  --  8.3* 8.2*  MG  --  1.4*  --   --   PHOS  --  3.1  --   --    Liver Function Tests: Recent Labs  Lab 12/25/16 0725 12/26/16 0925  AST 18 22  ALT 20 21  ALKPHOS 90 84  BILITOT 0.7 0.8  PROT 7.2 6.2*  ALBUMIN 2.5* 2.1*   Recent Labs  Lab 12/25/16 0725  LIPASE 21   No results for input(s): AMMONIA in the last 168 hours. CBC: Recent Labs  Lab 12/25/16 0725 12/26/16 0925 12/27/16 0536 12/28/16 0354  WBC 24.6* 21.2* 18.5* 19.2*  NEUTROABS  --  18.3* 15.3* 15.0*  HGB 11.0* 10.3* 9.2* 9.0*  HCT 32.6* 31.4* 28.7* 27.2*  MCV 86.5 87.0 88.0 85.0  PLT 843* 622* 566* 553*   Cardiac Enzymes: Recent Labs  Lab 12/25/16 1152 12/25/16 1922 12/26/16 0052  CKTOTAL 30*  --   --   TROPONINI <0.03 <0.03 <0.03   BNP: BNP (last 3 results) No results for input(s): BNP in the last 8760 hours.  ProBNP (last 3 results) No results for input(s): PROBNP in the last 8760 hours.  CBG: Recent Labs  Lab 12/28/16 2002 12/29/16 0014 12/29/16 0457 12/29/16 0751 12/29/16 1128  GLUCAP 211* 191* 214* 192* 257*       Signed:  Nita Sells MD   [facility] 12/29/2016, 2:16 PM

## 2016-12-29 NOTE — Social Work (Addendum)
CSW spoke with pt daughter on phone and emailed her a blank SNF packet for her information since she is not in town.   3:41pm- CSW spoke with MD and let him know due to [facility] system delays pt would discharge tomorrow, CSW also called pt daughter and provided her with current offers and let her know that CSW would continue to follow up with any additional offers.  4:02pm - CSW spoke with pt at bedside to discuss SNF placement and let her know that MD is okay with her discharging tomorrow. CSW also told pt that CSW will continue to follow to support her discharge when she has selected a provider.  CSW will continue to follow and provide support and information as we receive it regarding discharge and placement.   [place], [place] Work [phone]

## 2016-12-29 NOTE — [place] FL2 (Addendum)
[place] MEDICAID FL2 LEVEL OF CARE SCREENING TOOL     IDENTIFICATION  Patient Name: Leslie Gamble Birthdate: [birth year] Sex: female Admission Date (Current Location): 12/25/2016  [facility] and [place] Number:  Herbalist and Address:  [facility] [place], [place] [address] [place], [place] [address]      Provider Number: [id number]  Attending Physician Name and Address:  Nita Sells, MD  Relative Name and Phone Number:       Current Level of Care: Hospital Recommended Level of Care: [place] Prior Approval Number:    Date Approved/Denied:   PASRR Number:  [id number]  Discharge Plan: SNF    Current Diagnoses: Patient Active Problem List   Diagnosis Date Noted  . AKI (acute kidney injury) ([place]) 12/26/2016  . Acute kidney injury ([place]) 12/25/2016  . Diabetes mellitus type 2, uncontrolled ([place]) 12/25/2016  . Asthma 12/25/2016  . Severe depression ([place]) 12/25/2016  . Junctional escape rhythm 12/25/2016  . RUQ abdominal pain 12/25/2016  . Chronic pain 12/25/2016  . Hypomagnesemia 12/25/2016  . Acute blood loss anemia 05/08/2015  . Hematochezia 05/07/2015  . Bleeding gastrointestinal   . Diabetes mellitus with complication ([place])   . Essential hypertension   . Fibroid uterus 05/17/2013  . Obesity 12/30/2011  . Smoker 12/30/2011  . Diabetes mellitus, type 2 ([place])   . Depression   . COPD GOLD II   . Arthritis   . Diverticulitis     Orientation RESPIRATION BLADDER Height & Weight     Self, Time, Situation, Place  Normal Incontinent, External catheter Weight: 171 lb 11.8 oz (77.9 kg) Height:  5\' 5"  (165.1 cm)  BEHAVIORAL SYMPTOMS/MOOD NEUROLOGICAL BOWEL NUTRITION STATUS      Continent Diet(see DC summary)  AMBULATORY STATUS COMMUNICATION OF NEEDS Skin   Limited Assist Verbally Normal                       Personal Care Assistance Level of Assistance  Bathing, Dressing Bathing Assistance: Limited  assistance   Dressing Assistance: Limited assistance     Functional Limitations Info             SPECIAL CARE FACTORS FREQUENCY  PT (By licensed PT), OT (By licensed OT)     PT Frequency: 5/wk OT Frequency: 5/wk            Contractures      Additional Factors Info  Code Status, Allergies, Insulin Sliding Scale Code Status Info: FULL Allergies Info: Avelox Moxifloxacin Hcl In Nacl, Cephalexin, Ciprofloxacin, Clindamycin Hcl, Codeine, Diflucan Fluconazole, Lidocaine, Metronidazole, Nitrofurantoin Monohyd Macro, Other, Penicillins, Shrimp Shellfish Allergy, Sulfa Antibiotics, Tetracyclines & Related, Tramadol Hcl   Insulin Sliding Scale Info: 6/day       Current Medications (12/29/2016):  This is the current hospital active medication list Current Facility-Administered Medications  Medication Dose Route Frequency Provider Last Rate Last Dose  . acetaminophen (TYLENOL) tablet 650 mg  650 mg Oral Q6H PRN Samella Parr, NP   650 mg at 12/29/16 1114   Or  . acetaminophen (TYLENOL) suppository 650 mg  650 mg Rectal Q6H PRN Samella Parr, NP      . albuterol (PROVENTIL) (2.5 MG/3ML) 0.083% nebulizer solution 2.5 mg  2.5 mg Inhalation Q6H PRN Samella Parr, NP      . ALPRAZolam Duanne Moron) tablet 0.5 mg  0.5 mg Oral BID PRN Samella Parr, NP   0.5 mg at 12/29/16 0513  . enoxaparin (  LOVENOX) injection 40 mg  40 mg Subcutaneous Q24H Samella Parr, NP   40 mg at 12/29/16 1114  . feeding supplement (GLUCERNA SHAKE) (GLUCERNA SHAKE) liquid 237 mL  237 mL Oral BID BM Samtani, Jai-Gurmukh, MD   237 mL at 12/29/16 1053  . hydrALAZINE (APRESOLINE) injection 10 mg  10 mg Intravenous Q6H PRN Samella Parr, NP      . insulin aspart (novoLOG) injection 0-9 Units  0-9 Units Subcutaneous Q4H Samella Parr, NP   5 Units at 12/29/16 1242  . magnesium sulfate IVPB 2 g 50 mL  2 g Intravenous Once Samella Parr, NP   Stopped at 12/25/16 1300  . MEDLINE mouth rinse  15 mL Mouth  Rinse BID Nita Sells, MD   15 mL at 12/28/16 1009  . mometasone-formoterol (DULERA) 100-5 MCG/ACT inhaler 2 puff  2 puff Inhalation BID Samella Parr, NP   2 puff at 12/29/16 0906  . MUSCLE RUB CREA   Topical PRN Blount, Scarlette Shorts T, NP      . promethazine (PHENERGAN) injection 6.25 mg  6.25 mg Intravenous Q6H PRN Samella Parr, NP       Or  . ondansetron [facility]) injection 4 mg  4 mg Intravenous Q6H PRN Samella Parr, NP      . pantoprazole (PROTONIX) EC tablet 40 mg  40 mg Oral BID Alvira Philips, RPH   40 mg at 12/29/16 1048  . polyethylene glycol (MIRALAX / GLYCOLAX) packet 17 g  17 g Oral Daily Nita Sells, MD   17 g at 12/29/16 1048  . sodium chloride flush (NS) 0.9 % injection 3 mL  3 mL Intravenous Q12H Erin Hearing L, NP   3 mL at 12/29/16 1049  . zolpidem (AMBIEN) tablet 5 mg  5 mg Oral QHS PRN Nita Sells, MD   5 mg at 12/28/16 2231     Discharge Medications: Please see discharge summary for a list of discharge medications.  Relevant Imaging Results:  Relevant Lab Results:   Additional Information SS#: [id number]  Jorge Ny, LCSW

## 2016-12-29 NOTE — Progress Notes (Signed)
COURTESY NOTE: Reviewed blood film on this patient at the request of Dr Verlon Au. The red cell series is unremarkable. Platelets are elevated. The white cells show no significant immaturity-- see only polys and a few bands.   Anticipate bcr.abl will be negative.   Given the bland review of the blood film and the fact that the WBC and platelets are drifting down, we are most likely dealing with a reactive process not a primary bone marrow disorder (MPS). Will follow peripherally and proceed to formal consult if requested.

## 2016-12-30 DIAGNOSIS — N179 Acute kidney failure, unspecified: Secondary | ICD-10-CM | POA: Diagnosis not present

## 2016-12-30 DIAGNOSIS — E1165 Type 2 diabetes mellitus with hyperglycemia: Secondary | ICD-10-CM

## 2016-12-30 DIAGNOSIS — G8929 Other chronic pain: Secondary | ICD-10-CM

## 2016-12-30 DIAGNOSIS — E1365 Other specified diabetes mellitus with hyperglycemia: Secondary | ICD-10-CM | POA: Diagnosis not present

## 2016-12-30 DIAGNOSIS — R4182 Altered mental status, unspecified: Secondary | ICD-10-CM | POA: Diagnosis not present

## 2016-12-30 DIAGNOSIS — E119 Type 2 diabetes mellitus without complications: Secondary | ICD-10-CM | POA: Diagnosis not present

## 2016-12-30 LAB — GLUCOSE, CAPILLARY
GLUCOSE-CAPILLARY: 199 mg/dL — AB (ref 65–99)
GLUCOSE-CAPILLARY: 193 mg/dL — AB (ref 65–99)
Glucose-Capillary: 198 mg/dL — ABNORMAL HIGH (ref 65–99)
Glucose-Capillary: 169 mg/dL — ABNORMAL HIGH (ref 65–99)

## 2016-12-30 MED ORDER — INSULIN ASPART 100 UNIT/ML ~~LOC~~ SOLN: 0.0000 [IU] | Freq: Three times a day (TID) | SUBCUTANEOUS | Status: AC

## 2016-12-30 MED ORDER — LOSARTAN POTASSIUM 100 MG PO TABS: 100.0000 mg | ORAL_TABLET | Freq: Every day | ORAL | Status: AC

## 2016-12-30 MED ORDER — POTASSIUM CHLORIDE CRYS ER 20 MEQ PO TBCR
40.0000 meq | EXTENDED_RELEASE_TABLET | Freq: Once | ORAL | Status: AC
Start: 2016-12-30 — End: 2016-12-30
  Administered 2016-12-30: 40 meq via ORAL
  Filled 2016-12-30: qty 2

## 2016-12-30 MED ORDER — MAGNESIUM SULFATE 2 GM/50ML IV SOLN
2.0000 g | Freq: Once | INTRAVENOUS | Status: AC
  Administered 2016-12-30: 2 g via INTRAVENOUS
  Filled 2016-12-30 (×2): qty 50

## 2016-12-30 MED ORDER — ALPRAZOLAM 0.5 MG PO TABS: 0.5000 mg | ORAL_TABLET | Freq: Two times a day (BID) | ORAL | 0 refills | Status: DC | PRN

## 2016-12-30 NOTE — Consult Note (Signed)
[facility] CM Primary Care Navigator  12/30/2016  Leslie Gamble [birth year] [id number]   Met with patient, son Leslie Gamble) and daughter Leslie Gamble- from [place]) at the bedside to identify possible discharge needs.  Patient reportsthat she was having nausea, loss of appetite, weakness and abdominal discomfort that had resulted to this admission.  Patient endorses Dr. Wenda Low with [facility] as herprimary care provider.   [facility] [address] [facility] [employer] [facility] to obtain medications without anyproblem.   Patient reportsmanaging her own medicationsat home straight out of the containers.  Patient verbalized that sons (Leslie Gamble or Leslie Gamble) and friend Leslie Gamble) provide transportation to her doctors'appointments.  Patient lives alone and independent with self care prior to admission. Her sons and friend (who live nearby) can provide some assistance with care if needed per patient.  Per Inpatient social worker note, family is unable to provide 24/7 support for her. Anticipated discharge plan is skilled nursing facility (SNF- in process)for rehabilitation per therapy recommendation in order to increase her independence and safety with mobility.  Patient voiced understanding to callprimary care provider's officewhen shegets backhometo schedulea post discharge follow-upwithin 1-2 weeks or sooner if needed. Patient letter (with PCP's contact number) was provided as areminder.  Explained to patient and children about [facility] CM services available for health management at home and patient expressed interest for it.  COPD Action plan discussed with patient. Diabetic diet, medication, exercise and ways to manage diabetes were explained to her. Patient reports that her blood sugar was "running high" upon admission. Her primary care provider closely monitors and manages her DM as stated.  Patient voiced  understandingfor need to seek referral from primary care provider to [facility] care management asdeemed necessary and appropriatefor services in the future- on her next follow-up visit or when shereturns back home.  [facility] care management information provided for future needs that may arise.   For additional questions please contact:  Leslie Gamble, BSN, RN-BC [facility] PRIMARY CARE Navigator Cell: [phone]

## 2016-12-30 NOTE — Clinical Social Work Placement (Signed)
   CLINICAL SOCIAL WORK PLACEMENT  NOTE [place]  Date:  12/30/2016  Patient Details  Name: Leslie Gamble MRN: [id number] Date of Birth: Mar 20, 1940  Clinical Social Work is seeking post-discharge placement for this patient at the [place] level of care (*CSW will initial, date and re-position this form in  chart as items are completed):  Yes   Patient/family provided with [place] Work Department's list of facilities offering this level of care within the geographic area requested by the patient (or if unable, by the patient's family).  Yes   Patient/family informed of their freedom to choose among providers that offer the needed level of care, that participate in Medicare, Medicaid or managed care program needed by the patient, have an available bed and are willing to accept the patient.  Yes   Patient/family informed of [facility]'s ownership interest in [facility] and [facility], as well as of the fact that they are under no obligation to receive care at these facilities.  PASRR submitted to EDS on       PASRR number received on 12/30/16     Existing PASRR number confirmed on       FL2 transmitted to all facilities in geographic area requested by pt/family on 12/29/16     FL2 transmitted to all facilities within larger geographic area on       Patient informed that his/her managed care company has contracts with or will negotiate with certain facilities, including the following:        Yes   Patient/family informed of bed offers received.  Patient chooses bed at [facility]     Physician recommends and patient chooses bed at      Patient to be transferred to [facility] on  .  Patient to be transferred to facility by [facility]     Patient family notified on 12/30/16 of transfer.  Name of family member notified:  [name], daughter     PHYSICIAN       Additional Comment:     _______________________________________________ Alexander Mt, [place] 12/30/2016, 3:44 PM

## 2016-12-30 NOTE — Progress Notes (Signed)
Nutrition Follow-up  DOCUMENTATION CODES:   Not applicable  INTERVENTION:   -Continue Glucerna Shake po BID, each supplement provides 220 kcal and 10 grams of protein  NUTRITION DIAGNOSIS:   Inadequate oral intake related to acute illness, poor appetite as evidenced by per patient/family report.  Progressing  GOAL:   Patient will meet greater than or equal to 90% of their needs  Progressing   MONITOR:   PO intake, Supplement acceptance, Weight trends, Labs  REASON FOR ASSESSMENT:   Consult, Malnutrition Screening Tool Assessment of nutrition requirement/status  ASSESSMENT:   76 yo female admitted with AKI, abdominal pain with 1 day hx of anorexia and nausea. Pt with hx of HTN, DM, severe depression, COPD (COPD gold II), diverticulitis in 2017  Pt working with therapies at time of visit.   Pt's intake has improved since last visit. Noted meal completion 25-100% (averaging 60% meal completion). Supplement order was modified to Glucerna on 12/26/16 by RN due to high CBGS.   Pt with no BM since 12/23/16; miralax ordered on 12/27/16- pt refused today's dose.   Pt is medically stable for discharge. Per CSW note, pt to discharge to SNF, however, awaiting PASSAR number.   Labs reviewed: CBGS: 151-217 (inpatient orders for glycemic control are 0-9 units insulin aspart every 4 hours).  Diet Order:  Diet Carb Modified Fluid consistency: Thin; Room service appropriate? Yes Diet - low sodium heart healthy  EDUCATION NEEDS:   No education needs have been identified at this time  Skin:  Skin Assessment: Reviewed RN Assessment  Last BM:  12/23/16  Height:   Ht Readings from Last 1 Encounters:  12/25/16 5\' 5"  (1.651 m)    Weight:   Wt Readings from Last 1 Encounters:  12/30/16 171 lb 15.7 oz (78 kg)    Ideal Body Weight:  56.8 kg  BMI:  Body mass index is 28.62 kg/m.  Estimated Nutritional Needs:   Kcal:  1500-1700 kcals  Protein:  77-85 g  Fluid:  >/= 1.7  L    Kevontay Burks A. Jimmye Norman, RD, LDN, CDE Pager: [phone] After hours Pager: [phone]

## 2016-12-30 NOTE — Care Management Important Message (Signed)
Important Message  Patient Details  Name: Leslie Gamble MRN: [id number] Date of Birth: 1940/03/28   Medicare Important Message Given:  Yes    Orbie Pyo 12/30/2016, 2:34 PM

## 2016-12-30 NOTE — Progress Notes (Addendum)
PROGRESS NOTE   Leslie Gamble  ELF:[id number]    DOB: Jun 16, 1940    DOA: 12/25/2016  PCP: Wenda Low, MD   I have briefly reviewed patients previous medical records in [facility].  Brief Narrative:  76 year old female with PMH of DM 2, HTN, apparent chronic pain on oxycodone, severe depression recently started on lithium (in addition to prior Cymbalta) in the past 1 month presented to ED with nausea without vomiting. EMS noted CBG of 575. In ED, noted mild acute kidney injury, leukocytosis of 24.6K and thrombocytosis of 843K. Lithium level was normal. She was admitted for evaluation and management of acute kidney injury and some concern regarding RUQ tenderness.   Assessment & Plan:   Principal Problem:   Acute kidney injury ([place]) Active Problems:   Diabetes mellitus type 2, uncontrolled ([place])   Asthma   Severe depression ([place])   Junctional escape rhythm   RUQ abdominal pain   Chronic pain   Hypomagnesemia   AKI (acute kidney injury) ([place])   1. Acute kidney injury: Secondary to poor oral intake related to nausea and anorexia complicated by polypharmacy home medications including metformin, ARB, thiazide, lithium and periodic NSAID use. Culprit medications were temporarily held. Resolved after IV fluid hydration. 2. Type II DM, uncontrolled: Initial reported CBG was 575. DKA not suspected on admission. Metformin was temporarily held. She apparently has not been on Liraglutide. Placed on SSI. A1c 7.9. CBG's mildly controlled. Would continue metformin and NovoLog SSI at discharge. 3. RUQ tenderness: Patient actually presented with nausea and anorexia without abdominal pain or vomiting. She has chronic pain for which she takes oxycodone. NM scan suggestive of chronic cholecystitis however patient not tender on follow-up in RUQ. Tolerating meals without any pain. LFTs normal. If has recurrence of GI symptoms and/or abdominal pain as outpatient, may consider further evaluation  including general surgery consultation. 4. Leukocytosis: Unclear etiology. UA and chest x-ray negative.Dr. Verlon Au discussed with Dr. Jana Hakim, Hematology on 12/10 and as per Dr. Virgie Dad note: Peripheral smear shows unremarkable RBCs, platelets are elevated, white cells show no significant immaturity-see only polys and a few bands. He suspects BCR-ABL 1 will be negative but needs to be followed up outpatient. As per his recommendations, given the bland review of the blood film and the fact that the WBC and platelets are drifting down, suspects likely dealing with a reactive process and not a primary bone marrow disorder. Follow CBCs in a couple days as outpatient. 5. Essential hypertension: Controlled. ARB/HCTZ were temporarily held. Resumed ARB alone at DC. Continue prior home amlodipine. 6. Anxiety and depression: Followed by Psychiatrist Dr. Clovis Pu who prescribed lithium as a new medication about a month ago. Due to acute kidney injury and GI symptoms, lithium was discontinued and due to advanced age and need for multiple medications to treat for other chronic health problems, lithium may not be the best drug of choice for this patient. Prior home Cymbalta was continued. Recommend close outpatient follow-up with her psychiatrist. 7. Asthma/COPD: Stable without clinical bronchospasm. 8. Chronic pain: Judicious use of opioids and outpatient follow-up with primary prescribing physician.  9. Mouth pain/? Right TMJ pain: Mild tenderness at right TMJ joint made worse with activity. Otherwise no acute findings. Recommend outpatient dental follow-up. 10. Iron deficiency anemia: Continue ferrous sulfate supplements.  11. Tobacco abuse: Cessation counseled. Continue nicotine patch. 12. Hypomagnesemia/hypokalemia: Replaced prior to DC. Follow in a day or 2 at SNF. Magnesium was 1.4 on 12/7. Potassium was 3.2. IV magnesium had  been ordered on 12/7 but it appears that she never got it.   DVT prophylaxis:  Lovenox Code Status: Full Family Communication: Discussed in detail with patient's son at bedside.  Disposition: DC to SNF today.    Consultants:  Discussed with Hematology   Procedures:  None  Antimicrobials:  None    Subjective: Report some pain in the entire upper and lower gums and some of her teeth. Also reports reproducible pain and tenderness at right TMJ joint. No nausea, vomiting, abdominal pain reported. Tolerating diet. Had BM last night.    ROS:As above. Objective:  Vitals:   12/30/16 0457 12/30/16 0500 12/30/16 0850 12/30/16 1418  BP: 138/66   135/68  Pulse: 81   82  Resp: 18   17  Temp: 98.5 F (36.9 C)   98.4 F (36.9 C)  TempSrc: Oral   Oral  SpO2: 90%  92% 97%  Weight:  78 kg (171 lb 15.7 oz)    Height:        Examination:  General exam: pleasant elderly female, moderately built and nourished, sitting up comfortably in chair. Oral cavity: Multiple missing teeth. No acute findings noted. Mild tenderness at right TMJ joint. Respiratory system: Clear to auscultation. Respiratory effort normal. Cardiovascular system: S1 & S2 heard, RRR. No JVD, murmurs, rubs, gallops or clicks. No pedal edema. Gastrointestinal system: Abdomen is nondistended, soft and nontender. No organomegaly or masses felt. Normal bowel sounds heard. Central nervous system: Alert and oriented. No focal neurological deficits. Extremities: Symmetric 5 x 5 power. Skin: No rashes, lesions or ulcers Psychiatry: Judgement and insight appear normal. Mood & affect appropriate.     Data Reviewed: I have personally reviewed following labs and imaging studies  CBC: Recent Labs  Lab 12/25/16 0725 12/26/16 0925 12/27/16 0536 12/28/16 0354  WBC 24.6* 21.2* 18.5* 19.2*  NEUTROABS  --  18.3* 15.3* 15.0*  HGB 11.0* 10.3* 9.2* 9.0*  HCT 32.6* 31.4* 28.7* 27.2*  MCV 86.5 87.0 88.0 85.0  PLT 843* 622* 566* 485*   Basic Metabolic Panel: Recent Labs  Lab 12/25/16 0725 12/25/16 1152  12/26/16 0925 12/27/16 0536  NA 130*  --  134* 138  K 4.0  --  3.9 3.2*  CL 93*  --  102 104  CO2 21*  --  21* 25  GLUCOSE 386*  --  237* 150*  BUN 21*  --  8 7  CREATININE 1.01*  --  0.82 0.72  CALCIUM 9.1  --  8.3* 8.2*  MG  --  1.4*  --   --   PHOS  --  3.1  --   --    Liver Function Tests: Recent Labs  Lab 12/25/16 0725 12/26/16 0925  AST 18 22  ALT 20 21  ALKPHOS 90 84  BILITOT 0.7 0.8  PROT 7.2 6.2*  ALBUMIN 2.5* 2.1*   Coagulation Profile: No results for input(s): INR, PROTIME in the last 168 hours. Cardiac Enzymes: Recent Labs  Lab 12/25/16 1152 12/25/16 1922 12/26/16 0052  CKTOTAL 30*  --   --   TROPONINI <0.03 <0.03 <0.03   HbA1C: No results for input(s): HGBA1C in the last 72 hours. CBG: Recent Labs  Lab 12/29/16 2017 12/29/16 2359 12/30/16 0401 12/30/16 0744 12/30/16 1219  GLUCAP 217* 151* 198* 169* 199*    No results found for this or any previous visit (from the past 240 hour(s)).       Radiology Studies: No results found.      Scheduled  Meds: . enoxaparin (LOVENOX) injection  40 mg Subcutaneous Q24H  . feeding supplement (GLUCERNA SHAKE)  237 mL Oral BID BM  . ferrous sulfate  325 mg Oral Q breakfast  . insulin aspart  0-9 Units Subcutaneous Q4H  . mouth rinse  15 mL Mouth Rinse BID  . mometasone-formoterol  2 puff Inhalation BID  . pantoprazole  40 mg Oral BID  . polyethylene glycol  17 g Oral Daily  . sodium chloride flush  3 mL Intravenous Q12H   Continuous Infusions: . magnesium sulfate 1 - 4 g bolus IVPB Stopped (12/25/16 1300)     LOS: 4 days     Vernell Leep, MD, FACP, [facility]. [facility] Pager [phone]  If 7PM-7AM, please contact night-coverage [email or web address] Password [email or web address] 12/30/2016, 2:21 PM

## 2016-12-30 NOTE — Progress Notes (Signed)
Physical Therapy Treatment Patient Details Name: Leslie Gamble MRN: [id number] DOB: 1940/09/05 Today's Date: 12/30/2016    History of Present Illness Pt is a 76 y.o. female with medical history significant for diabetes mellitus 2 on oral agents including metformin, asthma, apparent chronic pain on oxycodone, and severe depression recently started on lithium (in addition to prior Cymbalta) in the past 1 month.  She presented to the ER reporting 24 hours of nausea without vomiting, no diarrhea, anorexia and generalized weakness. Pt admitted with dx of acute kidney injury.    PT Comments    Continuing work on functional mobility and activity tolerance;  Able to walk in hallway further, good use of RW to [facility] painful R knee; Overall progressing well; Anticipate continuing good progress at post-acute rehabilitation.    Follow Up Recommendations  SNF     Equipment Recommendations  Rolling walker with 5" wheels    Recommendations for Other Services       Precautions / Restrictions Precautions Precautions: Fall    Mobility  Bed Mobility Overal bed mobility: Needs Assistance Bed Mobility: Supine to Sit;Sit to Supine     Supine to sit: Supervision Sit to supine: Min assist   General bed mobility comments: Min assist to help RLE into bed  Transfers Overall transfer level: Needs assistance Equipment used: Rolling walker (2 wheeled) Transfers: Sit to/from Stand Sit to Stand: Min assist         General transfer comment: Cues for hand placement and safety, as well as to control descent to sit; Heavily dependent on UE support on RW  Ambulation/Gait Ambulation/Gait assistance: Min guard Ambulation Distance (Feet): 75 Feet Assistive device: Rolling walker (2 wheeled) Gait Pattern/deviations: Step-to pattern;Decreased stance time - right     General Gait Details: Cues for gait sequence and to use RW to [employer] painful R knee   Stairs            Wheelchair Mobility    Modified Rankin (Stroke Patients Only)       Balance     Sitting balance-Leahy Scale: Good       Standing balance-Leahy Scale: Poor(approaching Fair)                              Cognition Arousal/Alertness: Awake/alert Behavior During Therapy: Flat affect Overall Cognitive Status: Within Functional Limits for tasks assessed                                        Exercises      General Comments        Pertinent Vitals/Pain Pain Assessment: 0-10 Pain Score: 9  Faces Pain Scale: Hurts little more Pain Location: right knee  Pain Descriptors / Indicators: Sore Pain Intervention(s): Monitored during session    Home Living                      Prior Function            PT Goals (current goals can now be found in the care plan section) Acute Rehab PT Goals Patient Stated Goal: home PT Goal Formulation: With patient Time For Goal Achievement: 01/09/17 Potential to Achieve Goals: Good Progress towards PT goals: Progressing toward goals    Frequency    Min 3X/week      PT Plan Current plan remains appropriate  Co-evaluation              AM-PAC PT "6 Clicks" Daily Activity  Outcome Measure  Difficulty turning over in bed (including adjusting bedclothes, sheets and blankets)?: A Little Difficulty moving from lying on back to sitting on the side of the bed? : A Little Difficulty sitting down on and standing up from a chair with arms (e.g., wheelchair, bedside commode, etc,.)?: A Lot Help needed moving to and from a bed to chair (including a wheelchair)?: A Little Help needed walking in hospital room?: A Little Help needed climbing 3-5 steps with a railing? : A Lot 6 Click Score: 16    End of Session Equipment Utilized During Treatment: Gait belt Activity Tolerance: Patient tolerated treatment well Patient left: in bed;with call bell/phone within reach;with bed alarm set Nurse Communication: Mobility  status PT Visit Diagnosis: Muscle weakness (generalized) (M62.81);Difficulty in walking, not elsewhere classified (R26.2)     Time: 2725-3664 PT Time Calculation (min) (ACUTE ONLY): 20 min  Charges:  $Gait Training: 8-22 mins                    G Codes:       Roney Marion, PT  Acute Rehabilitation Services Pager [phone] Office [place] 12/30/2016, 4:28 PM

## 2016-12-30 NOTE — Social Work (Addendum)
CSW spoke with pt daughter, they have selected a bed placement at [facility].   CSW will follow up with [facility] regarding placement and continue to follow up regarding pt PASSAR number.  [facility] MUST system continues to experience delays.   CSW able to get PASSAR: [phone] E CSW continuing to follow.   Alexander Mt, [place] Work [phone]

## 2016-12-30 NOTE — Progress Notes (Signed)
[facility] arrived.  Pt's vital signs within normal limits. Pt denies pain.  Pt per stretcher scorted by staff.

## 2016-12-30 NOTE — Social Work (Signed)
Clinical Social Worker facilitated patient discharge including contacting patient family and facility to confirm patient discharge plans.  Clinical information faxed to facility and family agreeable with plan.  CSW arranged ambulance transport via [facility] to [facility].  RN to call [phone] with report prior to discharge.  Clinical Social Worker will sign off for now as social work intervention is no longer needed. Please consult [place] again if new need arises.  Alexander Mt, [place] Social Worker

## 2016-12-30 NOTE — Discharge Summary (Signed)
Physician Discharge Summary  Leslie Gamble SVX:[id number] DOB: 1940-05-01 DOA: 12/25/2016  PCP: Wenda Low, MD  Admit date: 12/25/2016 Discharge date: 12/30/2016  Time spent: 35 minutes  Recommendations for Outpatient Follow-up:  1. M.D. at SNF in 2 days with repeat labs (CBC with differential, CMP & magnesium). 2. Dr. Wenda Low, PCP on discharge from SNF. 3. Dr. Trula Slade, Psychiatry 4. Dr.Vinay Sonny Dandy, Hematology: Patient has appointment on 01/25/2017 at 1 PM.  Discharge Diagnoses:  Principal Problem:   Acute kidney injury [facility]) Active Problems:   Diabetes mellitus type 2, uncontrolled ([place])   Asthma   Severe depression ([place])   Junctional escape rhythm   RUQ abdominal pain   Chronic pain   Hypomagnesemia   AKI (acute kidney injury) ([place])   Discharge Condition: Improved and stable  Diet recommendation:  Heart healthy and diabetic diet.  Filed Weights   12/26/16 0446 12/28/16 0500 12/30/16 0500  Weight: 76.9 kg (169 lb 8.5 oz) 77.9 kg (171 lb 11.8 oz) 78 kg (171 lb 15.7 oz)    History of present illness:   76 year old female with PMH of DM 2, HTN, apparent chronic pain on oxycodone, severe depression recently started on lithium (in addition to prior Cymbalta) in the past 1 month presented to ED with nausea without vomiting. EMS noted CBG of 575. In ED, noted mild acute kidney injury, leukocytosis of 24.6K and thrombocytosis of 843K. Lithium level was normal. She was admitted for evaluation and management of acute kidney injury and some concern regarding RUQ tenderness.   Assessment & Plan:   1. Acute kidney injury: Secondary to poor oral intake related to nausea and anorexia complicated by polypharmacy home medications including metformin, ARB, thiazide, lithium and periodic NSAID use. Culprit medications were temporarily held. Resolved after IV fluid hydration. 2. Type II DM, uncontrolled: Initial reported CBG was 575. DKA not suspected on admission.  Metformin was temporarily held. She apparently has not been on Liraglutide. Placed on SSI. A1c 7.9. CBG's mildly controlled. Would continue metformin and NovoLog SSI at discharge. 3. RUQ tenderness: Patient actually presented with nausea and anorexia without abdominal pain or vomiting. She has chronic pain for which she takes oxycodone. RUQ ultrasound showed uncomplicated cholelithiasis. NM scan suggestive of chronic cholecystitis however patient not tender on follow-up in RUQ. Tolerating meals without any pain. LFTs normal. If has recurrence of GI symptoms and/or abdominal pain as outpatient, may consider further evaluation including general surgery consultation. 4. Leukocytosis: Unclear etiology. UA and chest x-ray negative.Dr. Verlon Au discussed with Dr. Jana Hakim, Hematology on 12/10 and as per Dr. Virgie Dad note: Peripheral smear shows unremarkable RBCs, platelets are elevated, white cells show no significant immaturity-see only polys and a few bands. He suspects BCR-ABL 1 will be negative but needs to be followed up outpatient. As per his recommendations, given the bland review of the blood film and the fact that the WBC and platelets are drifting down, suspects likely dealing with a reactive process and not a primary bone marrow disorder. Follow CBCs in a couple days as outpatient. 5. Essential hypertension: Controlled. ARB/HCTZ were temporarily held. Resumed ARB alone at DC. Continue prior home amlodipine. 6. Anxiety and depression: Followed by Psychiatrist Dr. Clovis Pu who prescribed lithium as a new medication about a month ago. Due to acute kidney injury and GI symptoms, lithium was discontinued and due to advanced age and need for multiple medications to treat for other chronic health problems, lithium may not be the best drug of choice for this patient.  Prior home Cymbalta and when necessary Xanax were continued. Recommend close outpatient follow-up with her psychiatrist. Stable without audiovisual  hallucinations, suicidal or homicidal ideations. 7. Asthma/COPD: Stable without clinical bronchospasm. 8. Chronic pain: Judicious use of opioids and outpatient follow-up with primary prescribing physician.  9. Mouth pain/? Right TMJ pain: Mild tenderness at right TMJ joint made worse with activity. Otherwise no acute findings. Recommend outpatient dental follow-up. 10. Iron deficiency anemia: Continue ferrous sulfate supplements.  11. Tobacco abuse: Cessation counseled. Continue nicotine patch. 12. Hypomagnesemia/hypokalemia: Replaced prior to DC. Follow in a day or 2 at SNF. Magnesium was 1.4 on 12/7. Potassium was 3.2. IV magnesium had been ordered on 12/7 but it appears that she never got it.   Consultants:  Discussed with Hematology   Procedures:  None  Subjective: Report some pain in the entire upper and lower gums and some of her teeth. Also reports reproducible pain and tenderness at right TMJ joint. No nausea, vomiting, abdominal pain reported. Tolerating diet. Had BM last night.     Discharge Exam: Vitals:   12/30/16 0850 12/30/16 1418  BP:  135/68  Pulse:  82  Resp:  17  Temp:  98.4 F (36.9 C)  SpO2: 92% 97%    General exam: pleasant elderly female, moderately built and nourished, sitting up comfortably in chair. Oral cavity: Multiple missing teeth. No acute findings noted. Mild tenderness at right TMJ joint. Respiratory system: Clear to auscultation. Respiratory effort normal. Cardiovascular system: S1 & S2 heard, RRR. No JVD, murmurs, rubs, gallops or clicks. No pedal edema. Gastrointestinal system: Abdomen is nondistended, soft and nontender. No organomegaly or masses felt. Normal bowel sounds heard. Central nervous system: Alert and oriented. No focal neurological deficits. Extremities: Symmetric 5 x 5 power. Skin: No rashes, lesions or ulcers Psychiatry: Judgement and insight appear normal. Mood & affect appropriate.    Discharge  Instructions   Discharge Instructions    Call MD for:  difficulty breathing, headache or visual disturbances   Complete by:  As directed    Call MD for:  extreme fatigue   Complete by:  As directed    Call MD for:  persistant dizziness or light-headedness   Complete by:  As directed    Call MD for:  persistant nausea and vomiting   Complete by:  As directed    Call MD for:  severe uncontrolled pain   Complete by:  As directed    Diet - low sodium heart healthy   Complete by:  As directed    Diet Carb Modified   Complete by:  As directed    Increase activity slowly   Complete by:  As directed      Allergies as of 12/30/2016      Reactions   Avelox [moxifloxacin Hcl In Nacl] Itching, Swelling   Cephalexin    Nose swelling   Ciprofloxacin Swelling   Clindamycin Hcl Swelling   Codeine    Lip swelling   Diflucan [fluconazole] Diarrhea   Lidocaine Swelling   Metronidazole Swelling   Nitrofurantoin Monohyd Macro Swelling   Other    Magic mouthwash    Penicillins Swelling   Shrimp [shellfish Allergy] Swelling   In nose and lips   Sulfa Antibiotics Swelling   Tetracyclines & Related Swelling   Tramadol Hcl    REACTION: facial swelling, dyspnea      Medication List    STOP taking these medications   cyclobenzaprine 10 MG tablet Commonly known as:  FLEXERIL  diphenhydrAMINE 25 mg capsule Commonly known as:  BENADRYL   KLOR-CON SPRINKLE 8 MEQ Cpcr capsule CR Generic drug:  Potassium Chloride CR   liraglutide 18 MG/3ML Sopn   losartan-hydrochlorothiazide 100-25 MG tablet Commonly known as:  HYZAAR   methocarbamol 500 MG tablet Commonly known as:  ROBAXIN   naproxen 500 MG tablet Commonly known as:  NAPROSYN     TAKE these medications   acetaminophen 500 MG tablet Commonly known as:  TYLENOL Take 1,000 mg by mouth every 6 (six) hours as needed for headache.   albuterol 108 (90 Base) MCG/ACT inhaler Commonly known as:  PROVENTIL HFA;VENTOLIN HFA Inhale  1-2 puffs into the lungs every 6 (six) hours as needed for wheezing or shortness of breath.   ALIGN EXTRA STRENGTH Caps Take 1 capsule by mouth daily.   ALPRAZolam 0.5 MG tablet Commonly known as:  XANAX Take 1 tablet (0.5 mg total) by mouth 2 (two) times daily as needed for anxiety.   amLODipine 10 MG tablet Commonly known as:  NORVASC Take 10 mg by mouth every morning.   DULoxetine 30 MG capsule Commonly known as:  CYMBALTA Take 30 mg by mouth See admin instructions. Take two tablets in the morning then take one at night until Wednesday take two in the morning and two at night   ferrous sulfate 325 (65 FE) MG EC tablet Take 325 mg by mouth daily with breakfast.   insulin aspart 100 UNIT/ML injection Commonly known as:  novoLOG Inject 0-9 Units into the skin 3 (three) times daily with meals. CBG < 70: implement hypoglycemia protocol CBG 70 - 120: 0 units CBG 121 - 150: 1 unit CBG 151 - 200: 2 units CBG 201 - 250: 3 units CBG 251 - 300: 5 units CBG 301 - 350: 7 units CBG 351 - 400: 9 units CBG > 400: call MD.   losartan 100 MG tablet Commonly known as:  COZAAR Take 1 tablet (100 mg total) by mouth daily.   metFORMIN 1000 MG tablet Commonly known as:  GLUCOPHAGE Take 1,000 mg by mouth 2 (two) times daily with a meal.   mometasone-formoterol 100-5 MCG/ACT Aero Commonly known as:  DULERA Inhale 2 puffs into the lungs 2 (two) times daily.   NICODERM CQ 14 mg/24hr patch Generic drug:  nicotine Place 14 mg onto the skin daily.   Oxycodone HCl 10 MG Tabs Take 0.5 tablets (5 mg total) by mouth every 6 (six) hours as needed. For knee pain What changed:  how much to take   polyethylene glycol packet Commonly known as:  MIRALAX / GLYCOLAX Take 17 g by mouth daily. What changed:    when to take this  reasons to take this   terconazole 0.8 % vaginal cream Commonly known as:  TERAZOL 3 Place 1 applicator vaginally at bedtime.      Allergies  Allergen Reactions   . Avelox [Moxifloxacin Hcl In Nacl] Itching and Swelling  . Cephalexin     Nose swelling  . Ciprofloxacin Swelling  . Clindamycin Hcl Swelling  . Codeine     Lip swelling  . Diflucan [Fluconazole] Diarrhea  . Lidocaine Swelling  . Metronidazole Swelling  . Nitrofurantoin Monohyd Macro Swelling  . Other     Magic mouthwash   . Penicillins Swelling  . Shrimp [Shellfish Allergy] Swelling    In nose and lips  . Sulfa Antibiotics Swelling  . Tetracyclines & Related Swelling  . Tramadol Hcl     REACTION: facial swelling,  dyspnea    Contact information for follow-up providers    Wenda Low, MD. Schedule an appointment as soon as possible for a visit.   Specialty:  Internal Medicine Why:  Upon discharge from SNF. Contact information: [address], [address] [place] [address] [phone]        M.D. at SNF. Schedule an appointment as soon as possible for a visit in 2 day(s).   Why:  To be seen with repeat labs (CBC with differential,CMP & Mg).       Cottle, Billey Co., MD. Schedule an appointment as soon as possible for a visit.   Specialty:  Psychiatry Contact information: [place] [address] [phone]            Contact information for after-discharge care    Destination    [facility] Follow up.   Service:  Skilled Nursing Contact information: [place] [phone]                   The results of significant diagnostics from this hospitalization (including imaging, microbiology, ancillary and laboratory) are listed below for reference.    Significant Diagnostic Studies: Dg Chest 2 View  Result Date: 12/27/2016 CLINICAL DATA:  76 year old female with pneumonia EXAM: CHEST  2 VIEW COMPARISON:  Prior chest x-ray 12/25/2016 FINDINGS: Stable mild cardiomegaly. Atherosclerotic calcifications again noted in the transverse aorta. The lungs remain clear. No  acute osseous abnormality. IMPRESSION: 1. Stable appearance of the chest compared to 12/25/2016. No acute cardiopulmonary process. 2. Stable cardiomegaly and aortic atherosclerosis. Electronically Signed   By: Jacqulynn Cadet M.D.   On: 12/27/2016 14:34   Dg Chest 2 View  Result Date: 12/25/2016 CLINICAL DATA:  Weakness, cough. EXAM: CHEST  2 VIEW COMPARISON:  Chest x-ray dated November 15, 2016. FINDINGS: The cardiomediastinal silhouette is normal in size. Normal pulmonary vascularity. No focal consolidation, pleural effusion, or pneumothorax. No acute osseous abnormality. IMPRESSION: No active cardiopulmonary disease. Electronically Signed   By: Titus Dubin M.D.   On: 12/25/2016 09:42   Nm Hepatobiliary Liver Func  Result Date: 12/25/2016 CLINICAL DATA:  Nausea with abnormal gallbladder ultrasound EXAM: NUCLEAR MEDICINE HEPATOBILIARY IMAGING TECHNIQUE: Sequential images of the abdomen were obtained out to 105 minutes following intravenous administration of radiopharmaceutical. At that point, the patient received 3 mg of morphine intravenously with imaging for additional 30 minutes. Frontal and right lateral projections obtained. RADIOPHARMACEUTICALS:  5.45 mCi Tc-34m  Choletec IV COMPARISON:  None. FINDINGS: Liver uptake of radiotracer is normal. There is prompt visualization of small bowel indicating patency of the common bile duct. Gallbladder did not visualize until after intravenous morphine administration. Ejection fraction was not calculated due to loss of validity for ejection fraction analysis after the intravenous morphine administration. IMPRESSION: Delayed visualization of gallbladder, indicative of chronic cholecystitis. Cystic duct is patent. Ejection fraction not calculated due to loss of the validity for ejection fraction analysis after intravenous morphine administration. Common bile duct patent with evidence of prompt visualization of small bowel. Liver uptake normal. Electronically  Signed   By: Lowella Grip III M.D.   On: 12/25/2016 17:11   US Abdomen Limited Ruq  Result Date: 12/25/2016 CLINICAL DATA:  Right upper quadrant pain EXAM: ULTRASOUND ABDOMEN LIMITED RIGHT UPPER QUADRANT COMPARISON:  None. FINDINGS: Gallbladder: Layering gallstones are noted with biliary sludge the largest calculus measuring approximately 8 mm. No sonographic Murphy sign noted by sonographer. Common bile duct:  Diameter: 7.3 mm, normal for age. Liver: No focal lesion identified. Mild increased echogenicity of the liver parenchyma limited in assessment by patient body habitus. Portal vein is patent on color Doppler imaging with normal direction of blood flow towards the liver. IMPRESSION: Uncomplicated cholelithiasis. Electronically Signed   By: Ashley Royalty M.D.   On: 12/25/2016 13:23    Microbiology: No results found for this or any previous visit (from the past 240 hour(s)).   Labs: Basic Metabolic Panel: Recent Labs  Lab 12/25/16 0725 12/25/16 1152 12/26/16 0925 12/27/16 0536  NA 130*  --  134* 138  K 4.0  --  3.9 3.2*  CL 93*  --  102 104  CO2 21*  --  21* 25  GLUCOSE 386*  --  237* 150*  BUN 21*  --  8 7  CREATININE 1.01*  --  0.82 0.72  CALCIUM 9.1  --  8.3* 8.2*  MG  --  1.4*  --   --   PHOS  --  3.1  --   --    Liver Function Tests: Recent Labs  Lab 12/25/16 0725 12/26/16 0925  AST 18 22  ALT 20 21  ALKPHOS 90 84  BILITOT 0.7 0.8  PROT 7.2 6.2*  ALBUMIN 2.5* 2.1*   Recent Labs  Lab 12/25/16 0725  LIPASE 21   CBC: Recent Labs  Lab 12/25/16 0725 12/26/16 0925 12/27/16 0536 12/28/16 0354  WBC 24.6* 21.2* 18.5* 19.2*  NEUTROABS  --  18.3* 15.3* 15.0*  HGB 11.0* 10.3* 9.2* 9.0*  HCT 32.6* 31.4* 28.7* 27.2*  MCV 86.5 87.0 88.0 85.0  PLT 843* 622* 566* 553*   Cardiac Enzymes: Recent Labs  Lab 12/25/16 1152 12/25/16 1922 12/26/16 0052  CKTOTAL 30*  --   --   TROPONINI <0.03 <0.03 <0.03     CBG: Recent Labs  Lab 12/29/16 2017 12/29/16 2359  12/30/16 0401 12/30/16 0744 12/30/16 1219  GLUCAP 217* 151* Elizabeth, MD, FACP, [facility]. [facility] Pager [phone]  If 7PM-7AM, please contact night-coverage [email or web address] Password [facility] 12/30/2016, 3:28 PM

## 2016-12-30 NOTE — Discharge Instructions (Signed)

## 2016-12-31 DIAGNOSIS — R269 Unspecified abnormalities of gait and mobility: Secondary | ICD-10-CM | POA: Diagnosis not present

## 2016-12-31 DIAGNOSIS — R739 Hyperglycemia, unspecified: Secondary | ICD-10-CM | POA: Diagnosis not present

## 2016-12-31 DIAGNOSIS — F411 Generalized anxiety disorder: Secondary | ICD-10-CM | POA: Diagnosis not present

## 2016-12-31 DIAGNOSIS — R600 Localized edema: Secondary | ICD-10-CM | POA: Diagnosis not present

## 2016-12-31 DIAGNOSIS — M25561 Pain in right knee: Secondary | ICD-10-CM | POA: Diagnosis not present

## 2016-12-31 DIAGNOSIS — R262 Difficulty in walking, not elsewhere classified: Secondary | ICD-10-CM | POA: Diagnosis not present

## 2016-12-31 DIAGNOSIS — M629 Disorder of muscle, unspecified: Secondary | ICD-10-CM | POA: Diagnosis not present

## 2016-12-31 DIAGNOSIS — R0600 Dyspnea, unspecified: Secondary | ICD-10-CM | POA: Diagnosis not present

## 2016-12-31 DIAGNOSIS — G8929 Other chronic pain: Secondary | ICD-10-CM | POA: Diagnosis not present

## 2016-12-31 DIAGNOSIS — N179 Acute kidney failure, unspecified: Secondary | ICD-10-CM | POA: Diagnosis not present

## 2016-12-31 DIAGNOSIS — M1990 Unspecified osteoarthritis, unspecified site: Secondary | ICD-10-CM | POA: Diagnosis not present

## 2016-12-31 DIAGNOSIS — E1165 Type 2 diabetes mellitus with hyperglycemia: Secondary | ICD-10-CM | POA: Diagnosis not present

## 2016-12-31 DIAGNOSIS — M25511 Pain in right shoulder: Secondary | ICD-10-CM | POA: Diagnosis not present

## 2016-12-31 DIAGNOSIS — I498 Other specified cardiac arrhythmias: Secondary | ICD-10-CM | POA: Diagnosis not present

## 2016-12-31 DIAGNOSIS — I10 Essential (primary) hypertension: Secondary | ICD-10-CM | POA: Diagnosis not present

## 2016-12-31 DIAGNOSIS — T56891S Toxic effect of other metals, accidental (unintentional), sequela: Secondary | ICD-10-CM | POA: Diagnosis not present

## 2016-12-31 DIAGNOSIS — R1011 Right upper quadrant pain: Secondary | ICD-10-CM | POA: Diagnosis not present

## 2016-12-31 DIAGNOSIS — J45998 Other asthma: Secondary | ICD-10-CM | POA: Diagnosis not present

## 2016-12-31 DIAGNOSIS — D551 Anemia due to other disorders of glutathione metabolism: Secondary | ICD-10-CM | POA: Diagnosis not present

## 2016-12-31 DIAGNOSIS — F332 Major depressive disorder, recurrent severe without psychotic features: Secondary | ICD-10-CM | POA: Diagnosis not present

## 2016-12-31 DIAGNOSIS — M6281 Muscle weakness (generalized): Secondary | ICD-10-CM | POA: Diagnosis not present

## 2016-12-31 DIAGNOSIS — F419 Anxiety disorder, unspecified: Secondary | ICD-10-CM | POA: Diagnosis not present

## 2016-12-31 DIAGNOSIS — J189 Pneumonia, unspecified organism: Secondary | ICD-10-CM | POA: Diagnosis not present

## 2017-01-01 DIAGNOSIS — M629 Disorder of muscle, unspecified: Secondary | ICD-10-CM | POA: Diagnosis not present

## 2017-01-01 DIAGNOSIS — R269 Unspecified abnormalities of gait and mobility: Secondary | ICD-10-CM | POA: Diagnosis not present

## 2017-01-01 DIAGNOSIS — F419 Anxiety disorder, unspecified: Secondary | ICD-10-CM | POA: Diagnosis not present

## 2017-01-04 DIAGNOSIS — D551 Anemia due to other disorders of glutathione metabolism: Secondary | ICD-10-CM | POA: Diagnosis not present

## 2017-01-04 DIAGNOSIS — M1990 Unspecified osteoarthritis, unspecified site: Secondary | ICD-10-CM | POA: Diagnosis not present

## 2017-01-04 DIAGNOSIS — M6281 Muscle weakness (generalized): Secondary | ICD-10-CM | POA: Diagnosis not present

## 2017-01-04 LAB — BCR-ABL1, CML/ALL, PCR, QUANT

## 2017-01-05 DIAGNOSIS — M6281 Muscle weakness (generalized): Secondary | ICD-10-CM | POA: Diagnosis not present

## 2017-01-05 DIAGNOSIS — M25561 Pain in right knee: Secondary | ICD-10-CM | POA: Diagnosis not present

## 2017-01-05 DIAGNOSIS — R262 Difficulty in walking, not elsewhere classified: Secondary | ICD-10-CM | POA: Diagnosis not present

## 2017-01-05 DIAGNOSIS — M25511 Pain in right shoulder: Secondary | ICD-10-CM | POA: Diagnosis not present

## 2017-01-11 DIAGNOSIS — M6281 Muscle weakness (generalized): Secondary | ICD-10-CM | POA: Diagnosis not present

## 2017-01-11 DIAGNOSIS — R0600 Dyspnea, unspecified: Secondary | ICD-10-CM | POA: Diagnosis not present

## 2017-01-11 DIAGNOSIS — J45998 Other asthma: Secondary | ICD-10-CM | POA: Diagnosis not present

## 2017-01-11 DIAGNOSIS — F411 Generalized anxiety disorder: Secondary | ICD-10-CM | POA: Diagnosis not present

## 2017-01-14 DIAGNOSIS — G8929 Other chronic pain: Secondary | ICD-10-CM | POA: Diagnosis not present

## 2017-01-14 DIAGNOSIS — R269 Unspecified abnormalities of gait and mobility: Secondary | ICD-10-CM | POA: Diagnosis not present

## 2017-01-14 DIAGNOSIS — M6281 Muscle weakness (generalized): Secondary | ICD-10-CM | POA: Diagnosis not present

## 2017-01-15 DIAGNOSIS — G8929 Other chronic pain: Secondary | ICD-10-CM | POA: Diagnosis not present

## 2017-01-15 DIAGNOSIS — F411 Generalized anxiety disorder: Secondary | ICD-10-CM | POA: Diagnosis not present

## 2017-01-15 DIAGNOSIS — M6281 Muscle weakness (generalized): Secondary | ICD-10-CM | POA: Diagnosis not present

## 2017-01-20 ENCOUNTER — Other Ambulatory Visit: Payer: Self-pay | Admitting: *Deleted

## 2017-01-20 NOTE — Patient Outreach (Signed)
[facility]) Care Management  01/20/2017  POPPI SCANTLING 11-May-1940 [id number]   Met with Marita [place], SW at facility. seh reorts patient is doing good. She lives alone, they have a careplan meeting tomorrow.   Met with patient at bedside. She reports that her daughter lives in [place] but is coming in for a careplan meeting tomorrow.  RNCM gave [facility] packet for her and her daughter to review.   Plan to follow up for any [facility] care management needs closer to discharge.  Royetta Crochet. Laymond Purser, RN, BSN, [place] [phone]) Business Cell  [phone]) Toll Free Office

## 2017-01-21 ENCOUNTER — Other Ambulatory Visit: Payer: Self-pay | Admitting: Oncology

## 2017-01-21 ENCOUNTER — Encounter: Payer: Self-pay | Admitting: Oncology

## 2017-01-21 ENCOUNTER — Encounter: Payer: Self-pay | Admitting: Podiatry

## 2017-01-21 DIAGNOSIS — M1990 Unspecified osteoarthritis, unspecified site: Secondary | ICD-10-CM | POA: Diagnosis not present

## 2017-01-21 DIAGNOSIS — M6281 Muscle weakness (generalized): Secondary | ICD-10-CM | POA: Diagnosis not present

## 2017-01-21 DIAGNOSIS — R600 Localized edema: Secondary | ICD-10-CM | POA: Diagnosis not present

## 2017-01-25 ENCOUNTER — Encounter: Payer: Medicare Other | Admitting: Hematology and Oncology

## 2017-01-26 DIAGNOSIS — E1165 Type 2 diabetes mellitus with hyperglycemia: Secondary | ICD-10-CM | POA: Diagnosis not present

## 2017-01-26 DIAGNOSIS — F332 Major depressive disorder, recurrent severe without psychotic features: Secondary | ICD-10-CM | POA: Diagnosis not present

## 2017-01-26 DIAGNOSIS — J45998 Other asthma: Secondary | ICD-10-CM | POA: Diagnosis not present

## 2017-01-26 DIAGNOSIS — G8929 Other chronic pain: Secondary | ICD-10-CM | POA: Diagnosis not present

## 2017-01-26 DIAGNOSIS — M6281 Muscle weakness (generalized): Secondary | ICD-10-CM | POA: Diagnosis not present

## 2017-01-26 DIAGNOSIS — F411 Generalized anxiety disorder: Secondary | ICD-10-CM | POA: Diagnosis not present

## 2017-01-27 DIAGNOSIS — J45998 Other asthma: Secondary | ICD-10-CM | POA: Diagnosis not present

## 2017-01-27 DIAGNOSIS — Z794 Long term (current) use of insulin: Secondary | ICD-10-CM | POA: Diagnosis not present

## 2017-01-27 DIAGNOSIS — T56891S Toxic effect of other metals, accidental (unintentional), sequela: Secondary | ICD-10-CM | POA: Diagnosis not present

## 2017-01-27 DIAGNOSIS — M549 Dorsalgia, unspecified: Secondary | ICD-10-CM | POA: Diagnosis not present

## 2017-01-27 DIAGNOSIS — Z87440 Personal history of urinary (tract) infections: Secondary | ICD-10-CM | POA: Diagnosis not present

## 2017-01-27 DIAGNOSIS — I10 Essential (primary) hypertension: Secondary | ICD-10-CM | POA: Diagnosis not present

## 2017-01-27 DIAGNOSIS — G8929 Other chronic pain: Secondary | ICD-10-CM | POA: Diagnosis not present

## 2017-01-27 DIAGNOSIS — F411 Generalized anxiety disorder: Secondary | ICD-10-CM | POA: Diagnosis not present

## 2017-01-27 DIAGNOSIS — Z7951 Long term (current) use of inhaled steroids: Secondary | ICD-10-CM | POA: Diagnosis not present

## 2017-01-27 DIAGNOSIS — E1165 Type 2 diabetes mellitus with hyperglycemia: Secondary | ICD-10-CM | POA: Diagnosis not present

## 2017-01-27 DIAGNOSIS — M1990 Unspecified osteoarthritis, unspecified site: Secondary | ICD-10-CM | POA: Diagnosis not present

## 2017-01-27 DIAGNOSIS — Z79891 Long term (current) use of opiate analgesic: Secondary | ICD-10-CM | POA: Diagnosis not present

## 2017-01-27 DIAGNOSIS — F332 Major depressive disorder, recurrent severe without psychotic features: Secondary | ICD-10-CM | POA: Diagnosis not present

## 2017-01-28 DIAGNOSIS — I10 Essential (primary) hypertension: Secondary | ICD-10-CM | POA: Diagnosis not present

## 2017-01-28 DIAGNOSIS — M1990 Unspecified osteoarthritis, unspecified site: Secondary | ICD-10-CM | POA: Diagnosis not present

## 2017-01-28 DIAGNOSIS — M549 Dorsalgia, unspecified: Secondary | ICD-10-CM | POA: Diagnosis not present

## 2017-01-28 DIAGNOSIS — T56891S Toxic effect of other metals, accidental (unintentional), sequela: Secondary | ICD-10-CM | POA: Diagnosis not present

## 2017-01-28 DIAGNOSIS — E1165 Type 2 diabetes mellitus with hyperglycemia: Secondary | ICD-10-CM | POA: Diagnosis not present

## 2017-01-28 DIAGNOSIS — G8929 Other chronic pain: Secondary | ICD-10-CM | POA: Diagnosis not present

## 2017-01-29 DIAGNOSIS — G8929 Other chronic pain: Secondary | ICD-10-CM | POA: Diagnosis not present

## 2017-01-29 DIAGNOSIS — J449 Chronic obstructive pulmonary disease, unspecified: Secondary | ICD-10-CM | POA: Diagnosis not present

## 2017-01-29 DIAGNOSIS — E1165 Type 2 diabetes mellitus with hyperglycemia: Secondary | ICD-10-CM | POA: Diagnosis not present

## 2017-01-29 DIAGNOSIS — I10 Essential (primary) hypertension: Secondary | ICD-10-CM | POA: Diagnosis not present

## 2017-01-29 DIAGNOSIS — K811 Chronic cholecystitis: Secondary | ICD-10-CM | POA: Diagnosis not present

## 2017-01-29 DIAGNOSIS — Z794 Long term (current) use of insulin: Secondary | ICD-10-CM | POA: Diagnosis not present

## 2017-01-29 DIAGNOSIS — M549 Dorsalgia, unspecified: Secondary | ICD-10-CM | POA: Diagnosis not present

## 2017-01-29 DIAGNOSIS — N179 Acute kidney failure, unspecified: Secondary | ICD-10-CM | POA: Diagnosis not present

## 2017-01-29 DIAGNOSIS — T56891S Toxic effect of other metals, accidental (unintentional), sequela: Secondary | ICD-10-CM | POA: Diagnosis not present

## 2017-01-29 DIAGNOSIS — M1990 Unspecified osteoarthritis, unspecified site: Secondary | ICD-10-CM | POA: Diagnosis not present

## 2017-02-02 DIAGNOSIS — M549 Dorsalgia, unspecified: Secondary | ICD-10-CM | POA: Diagnosis not present

## 2017-02-02 DIAGNOSIS — E1165 Type 2 diabetes mellitus with hyperglycemia: Secondary | ICD-10-CM | POA: Diagnosis not present

## 2017-02-02 DIAGNOSIS — I10 Essential (primary) hypertension: Secondary | ICD-10-CM | POA: Diagnosis not present

## 2017-02-02 DIAGNOSIS — T56891S Toxic effect of other metals, accidental (unintentional), sequela: Secondary | ICD-10-CM | POA: Diagnosis not present

## 2017-02-02 DIAGNOSIS — G8929 Other chronic pain: Secondary | ICD-10-CM | POA: Diagnosis not present

## 2017-02-02 DIAGNOSIS — M1990 Unspecified osteoarthritis, unspecified site: Secondary | ICD-10-CM | POA: Diagnosis not present

## 2017-02-03 DIAGNOSIS — T56891S Toxic effect of other metals, accidental (unintentional), sequela: Secondary | ICD-10-CM | POA: Diagnosis not present

## 2017-02-03 DIAGNOSIS — E1165 Type 2 diabetes mellitus with hyperglycemia: Secondary | ICD-10-CM | POA: Diagnosis not present

## 2017-02-03 DIAGNOSIS — M549 Dorsalgia, unspecified: Secondary | ICD-10-CM | POA: Diagnosis not present

## 2017-02-03 DIAGNOSIS — I10 Essential (primary) hypertension: Secondary | ICD-10-CM | POA: Diagnosis not present

## 2017-02-03 DIAGNOSIS — M1990 Unspecified osteoarthritis, unspecified site: Secondary | ICD-10-CM | POA: Diagnosis not present

## 2017-02-03 DIAGNOSIS — G8929 Other chronic pain: Secondary | ICD-10-CM | POA: Diagnosis not present

## 2017-02-04 ENCOUNTER — Telehealth: Payer: Self-pay | Admitting: Hematology and Oncology

## 2017-02-04 DIAGNOSIS — G8929 Other chronic pain: Secondary | ICD-10-CM | POA: Diagnosis not present

## 2017-02-04 DIAGNOSIS — T56891S Toxic effect of other metals, accidental (unintentional), sequela: Secondary | ICD-10-CM | POA: Diagnosis not present

## 2017-02-04 DIAGNOSIS — M549 Dorsalgia, unspecified: Secondary | ICD-10-CM | POA: Diagnosis not present

## 2017-02-04 DIAGNOSIS — E1165 Type 2 diabetes mellitus with hyperglycemia: Secondary | ICD-10-CM | POA: Diagnosis not present

## 2017-02-04 DIAGNOSIS — I10 Essential (primary) hypertension: Secondary | ICD-10-CM | POA: Diagnosis not present

## 2017-02-04 DIAGNOSIS — M1990 Unspecified osteoarthritis, unspecified site: Secondary | ICD-10-CM | POA: Diagnosis not present

## 2017-02-04 NOTE — Telephone Encounter (Signed)
Spoke with patient daughter regarding appointment Date/Time/Location/Ph#

## 2017-02-05 DIAGNOSIS — E1165 Type 2 diabetes mellitus with hyperglycemia: Secondary | ICD-10-CM | POA: Diagnosis not present

## 2017-02-05 DIAGNOSIS — G8929 Other chronic pain: Secondary | ICD-10-CM | POA: Diagnosis not present

## 2017-02-05 DIAGNOSIS — M1990 Unspecified osteoarthritis, unspecified site: Secondary | ICD-10-CM | POA: Diagnosis not present

## 2017-02-05 DIAGNOSIS — M549 Dorsalgia, unspecified: Secondary | ICD-10-CM | POA: Diagnosis not present

## 2017-02-05 DIAGNOSIS — I10 Essential (primary) hypertension: Secondary | ICD-10-CM | POA: Diagnosis not present

## 2017-02-05 DIAGNOSIS — T56891S Toxic effect of other metals, accidental (unintentional), sequela: Secondary | ICD-10-CM | POA: Diagnosis not present

## 2017-02-08 DIAGNOSIS — M1990 Unspecified osteoarthritis, unspecified site: Secondary | ICD-10-CM | POA: Diagnosis not present

## 2017-02-08 DIAGNOSIS — T56891S Toxic effect of other metals, accidental (unintentional), sequela: Secondary | ICD-10-CM | POA: Diagnosis not present

## 2017-02-08 DIAGNOSIS — M549 Dorsalgia, unspecified: Secondary | ICD-10-CM | POA: Diagnosis not present

## 2017-02-08 DIAGNOSIS — G8929 Other chronic pain: Secondary | ICD-10-CM | POA: Diagnosis not present

## 2017-02-08 DIAGNOSIS — E1165 Type 2 diabetes mellitus with hyperglycemia: Secondary | ICD-10-CM | POA: Diagnosis not present

## 2017-02-08 DIAGNOSIS — I10 Essential (primary) hypertension: Secondary | ICD-10-CM | POA: Diagnosis not present

## 2017-02-09 DIAGNOSIS — M069 Rheumatoid arthritis, unspecified: Secondary | ICD-10-CM | POA: Diagnosis not present

## 2017-02-09 DIAGNOSIS — M150 Primary generalized (osteo)arthritis: Secondary | ICD-10-CM | POA: Diagnosis not present

## 2017-02-09 DIAGNOSIS — Z6823 Body mass index (BMI) 23.0-23.9, adult: Secondary | ICD-10-CM | POA: Diagnosis not present

## 2017-02-09 DIAGNOSIS — K219 Gastro-esophageal reflux disease without esophagitis: Secondary | ICD-10-CM | POA: Diagnosis not present

## 2017-02-09 DIAGNOSIS — E1165 Type 2 diabetes mellitus with hyperglycemia: Secondary | ICD-10-CM | POA: Diagnosis not present

## 2017-02-09 DIAGNOSIS — M2550 Pain in unspecified joint: Secondary | ICD-10-CM | POA: Diagnosis not present

## 2017-02-09 DIAGNOSIS — D473 Essential (hemorrhagic) thrombocythemia: Secondary | ICD-10-CM | POA: Diagnosis not present

## 2017-02-09 DIAGNOSIS — Z794 Long term (current) use of insulin: Secondary | ICD-10-CM | POA: Diagnosis not present

## 2017-02-09 DIAGNOSIS — R5383 Other fatigue: Secondary | ICD-10-CM | POA: Diagnosis not present

## 2017-02-09 DIAGNOSIS — M0579 Rheumatoid arthritis with rheumatoid factor of multiple sites without organ or systems involvement: Secondary | ICD-10-CM | POA: Diagnosis not present

## 2017-02-10 ENCOUNTER — Ambulatory Visit: Payer: Medicare Other | Admitting: Podiatry

## 2017-02-10 DIAGNOSIS — M549 Dorsalgia, unspecified: Secondary | ICD-10-CM | POA: Diagnosis not present

## 2017-02-10 DIAGNOSIS — G8929 Other chronic pain: Secondary | ICD-10-CM | POA: Diagnosis not present

## 2017-02-10 DIAGNOSIS — I10 Essential (primary) hypertension: Secondary | ICD-10-CM | POA: Diagnosis not present

## 2017-02-10 DIAGNOSIS — T56891S Toxic effect of other metals, accidental (unintentional), sequela: Secondary | ICD-10-CM | POA: Diagnosis not present

## 2017-02-10 DIAGNOSIS — E1165 Type 2 diabetes mellitus with hyperglycemia: Secondary | ICD-10-CM | POA: Diagnosis not present

## 2017-02-10 DIAGNOSIS — M1990 Unspecified osteoarthritis, unspecified site: Secondary | ICD-10-CM | POA: Diagnosis not present

## 2017-02-11 DIAGNOSIS — T56891S Toxic effect of other metals, accidental (unintentional), sequela: Secondary | ICD-10-CM | POA: Diagnosis not present

## 2017-02-11 DIAGNOSIS — E1165 Type 2 diabetes mellitus with hyperglycemia: Secondary | ICD-10-CM | POA: Diagnosis not present

## 2017-02-11 DIAGNOSIS — G8929 Other chronic pain: Secondary | ICD-10-CM | POA: Diagnosis not present

## 2017-02-11 DIAGNOSIS — I10 Essential (primary) hypertension: Secondary | ICD-10-CM | POA: Diagnosis not present

## 2017-02-11 DIAGNOSIS — M549 Dorsalgia, unspecified: Secondary | ICD-10-CM | POA: Diagnosis not present

## 2017-02-11 DIAGNOSIS — M1990 Unspecified osteoarthritis, unspecified site: Secondary | ICD-10-CM | POA: Diagnosis not present

## 2017-02-12 DIAGNOSIS — T56891S Toxic effect of other metals, accidental (unintentional), sequela: Secondary | ICD-10-CM | POA: Diagnosis not present

## 2017-02-12 DIAGNOSIS — M1990 Unspecified osteoarthritis, unspecified site: Secondary | ICD-10-CM | POA: Diagnosis not present

## 2017-02-12 DIAGNOSIS — E1165 Type 2 diabetes mellitus with hyperglycemia: Secondary | ICD-10-CM | POA: Diagnosis not present

## 2017-02-12 DIAGNOSIS — I10 Essential (primary) hypertension: Secondary | ICD-10-CM | POA: Diagnosis not present

## 2017-02-12 DIAGNOSIS — G8929 Other chronic pain: Secondary | ICD-10-CM | POA: Diagnosis not present

## 2017-02-12 DIAGNOSIS — M549 Dorsalgia, unspecified: Secondary | ICD-10-CM | POA: Diagnosis not present

## 2017-02-15 ENCOUNTER — Encounter: Payer: Medicare Other | Admitting: Podiatry

## 2017-02-16 DIAGNOSIS — M1990 Unspecified osteoarthritis, unspecified site: Secondary | ICD-10-CM | POA: Diagnosis not present

## 2017-02-16 DIAGNOSIS — M549 Dorsalgia, unspecified: Secondary | ICD-10-CM | POA: Diagnosis not present

## 2017-02-16 DIAGNOSIS — T56891S Toxic effect of other metals, accidental (unintentional), sequela: Secondary | ICD-10-CM | POA: Diagnosis not present

## 2017-02-16 DIAGNOSIS — I10 Essential (primary) hypertension: Secondary | ICD-10-CM | POA: Diagnosis not present

## 2017-02-16 DIAGNOSIS — G8929 Other chronic pain: Secondary | ICD-10-CM | POA: Diagnosis not present

## 2017-02-16 DIAGNOSIS — E1165 Type 2 diabetes mellitus with hyperglycemia: Secondary | ICD-10-CM | POA: Diagnosis not present

## 2017-02-17 DIAGNOSIS — M1990 Unspecified osteoarthritis, unspecified site: Secondary | ICD-10-CM | POA: Diagnosis not present

## 2017-02-17 DIAGNOSIS — T56891S Toxic effect of other metals, accidental (unintentional), sequela: Secondary | ICD-10-CM | POA: Diagnosis not present

## 2017-02-17 DIAGNOSIS — I10 Essential (primary) hypertension: Secondary | ICD-10-CM | POA: Diagnosis not present

## 2017-02-17 DIAGNOSIS — E1165 Type 2 diabetes mellitus with hyperglycemia: Secondary | ICD-10-CM | POA: Diagnosis not present

## 2017-02-17 DIAGNOSIS — M549 Dorsalgia, unspecified: Secondary | ICD-10-CM | POA: Diagnosis not present

## 2017-02-17 DIAGNOSIS — G8929 Other chronic pain: Secondary | ICD-10-CM | POA: Diagnosis not present

## 2017-02-18 DIAGNOSIS — I10 Essential (primary) hypertension: Secondary | ICD-10-CM | POA: Diagnosis not present

## 2017-02-18 DIAGNOSIS — E1165 Type 2 diabetes mellitus with hyperglycemia: Secondary | ICD-10-CM | POA: Diagnosis not present

## 2017-02-18 DIAGNOSIS — M549 Dorsalgia, unspecified: Secondary | ICD-10-CM | POA: Diagnosis not present

## 2017-02-18 DIAGNOSIS — M1990 Unspecified osteoarthritis, unspecified site: Secondary | ICD-10-CM | POA: Diagnosis not present

## 2017-02-18 DIAGNOSIS — T56891S Toxic effect of other metals, accidental (unintentional), sequela: Secondary | ICD-10-CM | POA: Diagnosis not present

## 2017-02-18 DIAGNOSIS — G8929 Other chronic pain: Secondary | ICD-10-CM | POA: Diagnosis not present

## 2017-02-19 DIAGNOSIS — N390 Urinary tract infection, site not specified: Secondary | ICD-10-CM | POA: Diagnosis not present

## 2017-02-22 ENCOUNTER — Inpatient Hospital Stay: Payer: Medicare Other | Attending: Hematology and Oncology | Admitting: Hematology and Oncology

## 2017-02-22 VITALS — BP 135/76 | HR 85 | Temp 98.2°F | Resp 17 | Ht 65.0 in | Wt 148.8 lb

## 2017-02-22 DIAGNOSIS — D473 Essential (hemorrhagic) thrombocythemia: Secondary | ICD-10-CM | POA: Diagnosis not present

## 2017-02-22 DIAGNOSIS — M069 Rheumatoid arthritis, unspecified: Secondary | ICD-10-CM | POA: Diagnosis not present

## 2017-02-22 DIAGNOSIS — D75839 Thrombocytosis, unspecified: Secondary | ICD-10-CM | POA: Insufficient documentation

## 2017-02-22 NOTE — Assessment & Plan Note (Signed)
Platelet count of 1061 on 02/09/2017 Platelet count 553 on 12/28/2017 Labs revealed elevated ferritin, elevated WBC count suggestive of an acute phase reactant physiology.  I discussed with the patient and her daughter that a bone marrow biopsy and Jak 2 mutation testing may help to rule out essential thrombocytosis.  However given the recent diagnosis of rheumatoid arthritis and having acute on chronic joint discomfort and arthritis, we decided to hold off on any additional testing until she returns back to see [place] in 3 months.  If by 3 months her platelet counts improve then we do not need to work it up any further.  Height however they remain elevated then we may have to do a bone marrow biopsy.

## 2017-02-22 NOTE — Progress Notes (Signed)
This encounter was created in error - please disregard.
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
## 2017-02-23 DIAGNOSIS — M1990 Unspecified osteoarthritis, unspecified site: Secondary | ICD-10-CM | POA: Diagnosis not present

## 2017-02-23 DIAGNOSIS — M549 Dorsalgia, unspecified: Secondary | ICD-10-CM | POA: Diagnosis not present

## 2017-02-23 DIAGNOSIS — E1165 Type 2 diabetes mellitus with hyperglycemia: Secondary | ICD-10-CM | POA: Diagnosis not present

## 2017-02-23 DIAGNOSIS — T56891S Toxic effect of other metals, accidental (unintentional), sequela: Secondary | ICD-10-CM | POA: Diagnosis not present

## 2017-02-23 DIAGNOSIS — G8929 Other chronic pain: Secondary | ICD-10-CM | POA: Diagnosis not present

## 2017-02-23 DIAGNOSIS — I10 Essential (primary) hypertension: Secondary | ICD-10-CM | POA: Diagnosis not present

## 2017-02-24 ENCOUNTER — Telehealth: Payer: Self-pay | Admitting: Hematology and Oncology

## 2017-02-24 DIAGNOSIS — M1990 Unspecified osteoarthritis, unspecified site: Secondary | ICD-10-CM | POA: Diagnosis not present

## 2017-02-24 DIAGNOSIS — I10 Essential (primary) hypertension: Secondary | ICD-10-CM | POA: Diagnosis not present

## 2017-02-24 DIAGNOSIS — T56891S Toxic effect of other metals, accidental (unintentional), sequela: Secondary | ICD-10-CM | POA: Diagnosis not present

## 2017-02-24 DIAGNOSIS — M549 Dorsalgia, unspecified: Secondary | ICD-10-CM | POA: Diagnosis not present

## 2017-02-24 DIAGNOSIS — G8929 Other chronic pain: Secondary | ICD-10-CM | POA: Diagnosis not present

## 2017-02-24 DIAGNOSIS — E1165 Type 2 diabetes mellitus with hyperglycemia: Secondary | ICD-10-CM | POA: Diagnosis not present

## 2017-02-24 NOTE — Telephone Encounter (Signed)
Mailed patient calendar of upcoming May appointments.

## 2017-02-25 DIAGNOSIS — G8929 Other chronic pain: Secondary | ICD-10-CM | POA: Diagnosis not present

## 2017-02-25 DIAGNOSIS — I10 Essential (primary) hypertension: Secondary | ICD-10-CM | POA: Diagnosis not present

## 2017-02-25 DIAGNOSIS — T56891S Toxic effect of other metals, accidental (unintentional), sequela: Secondary | ICD-10-CM | POA: Diagnosis not present

## 2017-02-25 DIAGNOSIS — E1165 Type 2 diabetes mellitus with hyperglycemia: Secondary | ICD-10-CM | POA: Diagnosis not present

## 2017-02-25 DIAGNOSIS — M549 Dorsalgia, unspecified: Secondary | ICD-10-CM | POA: Diagnosis not present

## 2017-02-25 DIAGNOSIS — M1990 Unspecified osteoarthritis, unspecified site: Secondary | ICD-10-CM | POA: Diagnosis not present

## 2017-02-26 DIAGNOSIS — M549 Dorsalgia, unspecified: Secondary | ICD-10-CM | POA: Diagnosis not present

## 2017-02-26 DIAGNOSIS — G8929 Other chronic pain: Secondary | ICD-10-CM | POA: Diagnosis not present

## 2017-02-26 DIAGNOSIS — I10 Essential (primary) hypertension: Secondary | ICD-10-CM | POA: Diagnosis not present

## 2017-02-26 DIAGNOSIS — T56891S Toxic effect of other metals, accidental (unintentional), sequela: Secondary | ICD-10-CM | POA: Diagnosis not present

## 2017-02-26 DIAGNOSIS — M1990 Unspecified osteoarthritis, unspecified site: Secondary | ICD-10-CM | POA: Diagnosis not present

## 2017-02-26 DIAGNOSIS — E1165 Type 2 diabetes mellitus with hyperglycemia: Secondary | ICD-10-CM | POA: Diagnosis not present

## 2017-03-01 DIAGNOSIS — E1165 Type 2 diabetes mellitus with hyperglycemia: Secondary | ICD-10-CM | POA: Diagnosis not present

## 2017-03-01 DIAGNOSIS — M549 Dorsalgia, unspecified: Secondary | ICD-10-CM | POA: Diagnosis not present

## 2017-03-01 DIAGNOSIS — I10 Essential (primary) hypertension: Secondary | ICD-10-CM | POA: Diagnosis not present

## 2017-03-01 DIAGNOSIS — T56891S Toxic effect of other metals, accidental (unintentional), sequela: Secondary | ICD-10-CM | POA: Diagnosis not present

## 2017-03-01 DIAGNOSIS — G8929 Other chronic pain: Secondary | ICD-10-CM | POA: Diagnosis not present

## 2017-03-01 DIAGNOSIS — M1990 Unspecified osteoarthritis, unspecified site: Secondary | ICD-10-CM | POA: Diagnosis not present

## 2017-03-02 DIAGNOSIS — D473 Essential (hemorrhagic) thrombocythemia: Secondary | ICD-10-CM | POA: Diagnosis not present

## 2017-03-02 DIAGNOSIS — M150 Primary generalized (osteo)arthritis: Secondary | ICD-10-CM | POA: Diagnosis not present

## 2017-03-02 DIAGNOSIS — M0579 Rheumatoid arthritis with rheumatoid factor of multiple sites without organ or systems involvement: Secondary | ICD-10-CM | POA: Diagnosis not present

## 2017-03-02 DIAGNOSIS — R700 Elevated erythrocyte sedimentation rate: Secondary | ICD-10-CM | POA: Diagnosis not present

## 2017-03-02 DIAGNOSIS — M2550 Pain in unspecified joint: Secondary | ICD-10-CM | POA: Diagnosis not present

## 2017-03-02 DIAGNOSIS — Z6822 Body mass index (BMI) 22.0-22.9, adult: Secondary | ICD-10-CM | POA: Diagnosis not present

## 2017-03-03 DIAGNOSIS — G8929 Other chronic pain: Secondary | ICD-10-CM | POA: Diagnosis not present

## 2017-03-03 DIAGNOSIS — E1165 Type 2 diabetes mellitus with hyperglycemia: Secondary | ICD-10-CM | POA: Diagnosis not present

## 2017-03-03 DIAGNOSIS — T56891S Toxic effect of other metals, accidental (unintentional), sequela: Secondary | ICD-10-CM | POA: Diagnosis not present

## 2017-03-03 DIAGNOSIS — M1990 Unspecified osteoarthritis, unspecified site: Secondary | ICD-10-CM | POA: Diagnosis not present

## 2017-03-03 DIAGNOSIS — M549 Dorsalgia, unspecified: Secondary | ICD-10-CM | POA: Diagnosis not present

## 2017-03-03 DIAGNOSIS — I10 Essential (primary) hypertension: Secondary | ICD-10-CM | POA: Diagnosis not present

## 2017-03-04 DIAGNOSIS — M549 Dorsalgia, unspecified: Secondary | ICD-10-CM | POA: Diagnosis not present

## 2017-03-04 DIAGNOSIS — T56891S Toxic effect of other metals, accidental (unintentional), sequela: Secondary | ICD-10-CM | POA: Diagnosis not present

## 2017-03-04 DIAGNOSIS — E1165 Type 2 diabetes mellitus with hyperglycemia: Secondary | ICD-10-CM | POA: Diagnosis not present

## 2017-03-04 DIAGNOSIS — M1990 Unspecified osteoarthritis, unspecified site: Secondary | ICD-10-CM | POA: Diagnosis not present

## 2017-03-04 DIAGNOSIS — G8929 Other chronic pain: Secondary | ICD-10-CM | POA: Diagnosis not present

## 2017-03-04 DIAGNOSIS — I10 Essential (primary) hypertension: Secondary | ICD-10-CM | POA: Diagnosis not present

## 2017-03-11 DIAGNOSIS — I10 Essential (primary) hypertension: Secondary | ICD-10-CM | POA: Diagnosis not present

## 2017-03-11 DIAGNOSIS — E1165 Type 2 diabetes mellitus with hyperglycemia: Secondary | ICD-10-CM | POA: Diagnosis not present

## 2017-03-11 DIAGNOSIS — T56891S Toxic effect of other metals, accidental (unintentional), sequela: Secondary | ICD-10-CM | POA: Diagnosis not present

## 2017-03-11 DIAGNOSIS — M549 Dorsalgia, unspecified: Secondary | ICD-10-CM | POA: Diagnosis not present

## 2017-03-11 DIAGNOSIS — M1990 Unspecified osteoarthritis, unspecified site: Secondary | ICD-10-CM | POA: Diagnosis not present

## 2017-03-11 DIAGNOSIS — G8929 Other chronic pain: Secondary | ICD-10-CM | POA: Diagnosis not present

## 2017-03-23 DIAGNOSIS — M0579 Rheumatoid arthritis with rheumatoid factor of multiple sites without organ or systems involvement: Secondary | ICD-10-CM | POA: Diagnosis not present

## 2017-03-30 DIAGNOSIS — K219 Gastro-esophageal reflux disease without esophagitis: Secondary | ICD-10-CM | POA: Diagnosis not present

## 2017-03-30 DIAGNOSIS — I700 Atherosclerosis of aorta: Secondary | ICD-10-CM | POA: Diagnosis not present

## 2017-03-30 DIAGNOSIS — D473 Essential (hemorrhagic) thrombocythemia: Secondary | ICD-10-CM | POA: Diagnosis not present

## 2017-03-30 DIAGNOSIS — F5104 Psychophysiologic insomnia: Secondary | ICD-10-CM | POA: Diagnosis not present

## 2017-03-30 DIAGNOSIS — Z1389 Encounter for screening for other disorder: Secondary | ICD-10-CM | POA: Diagnosis not present

## 2017-03-30 DIAGNOSIS — M069 Rheumatoid arthritis, unspecified: Secondary | ICD-10-CM | POA: Diagnosis not present

## 2017-03-30 DIAGNOSIS — F324 Major depressive disorder, single episode, in partial remission: Secondary | ICD-10-CM | POA: Diagnosis not present

## 2017-03-30 DIAGNOSIS — E1140 Type 2 diabetes mellitus with diabetic neuropathy, unspecified: Secondary | ICD-10-CM | POA: Diagnosis not present

## 2017-03-30 DIAGNOSIS — I10 Essential (primary) hypertension: Secondary | ICD-10-CM | POA: Diagnosis not present

## 2017-03-30 DIAGNOSIS — J449 Chronic obstructive pulmonary disease, unspecified: Secondary | ICD-10-CM | POA: Diagnosis not present

## 2017-04-01 DIAGNOSIS — E119 Type 2 diabetes mellitus without complications: Secondary | ICD-10-CM | POA: Diagnosis not present

## 2017-04-01 DIAGNOSIS — H524 Presbyopia: Secondary | ICD-10-CM | POA: Diagnosis not present

## 2017-04-01 DIAGNOSIS — H2513 Age-related nuclear cataract, bilateral: Secondary | ICD-10-CM | POA: Diagnosis not present

## 2017-04-20 DIAGNOSIS — M0579 Rheumatoid arthritis with rheumatoid factor of multiple sites without organ or systems involvement: Secondary | ICD-10-CM | POA: Diagnosis not present

## 2017-05-19 DIAGNOSIS — Z6822 Body mass index (BMI) 22.0-22.9, adult: Secondary | ICD-10-CM | POA: Diagnosis not present

## 2017-05-19 DIAGNOSIS — M0579 Rheumatoid arthritis with rheumatoid factor of multiple sites without organ or systems involvement: Secondary | ICD-10-CM | POA: Diagnosis not present

## 2017-05-19 DIAGNOSIS — M2550 Pain in unspecified joint: Secondary | ICD-10-CM | POA: Diagnosis not present

## 2017-05-19 DIAGNOSIS — Z79899 Other long term (current) drug therapy: Secondary | ICD-10-CM | POA: Diagnosis not present

## 2017-05-19 DIAGNOSIS — M150 Primary generalized (osteo)arthritis: Secondary | ICD-10-CM | POA: Diagnosis not present

## 2017-05-24 ENCOUNTER — Inpatient Hospital Stay: Payer: Medicare Other | Attending: Hematology and Oncology

## 2017-05-24 ENCOUNTER — Telehealth: Payer: Self-pay | Admitting: Hematology and Oncology

## 2017-05-24 ENCOUNTER — Inpatient Hospital Stay (HOSPITAL_BASED_OUTPATIENT_CLINIC_OR_DEPARTMENT_OTHER): Payer: Medicare Other | Admitting: Hematology and Oncology

## 2017-05-24 VITALS — BP 140/84 | HR 80 | Temp 97.6°F | Resp 17 | Ht 65.0 in | Wt 149.2 lb

## 2017-05-24 DIAGNOSIS — D473 Essential (hemorrhagic) thrombocythemia: Secondary | ICD-10-CM

## 2017-05-24 DIAGNOSIS — Z7984 Long term (current) use of oral hypoglycemic drugs: Secondary | ICD-10-CM

## 2017-05-24 DIAGNOSIS — D75839 Thrombocytosis, unspecified: Secondary | ICD-10-CM

## 2017-05-24 DIAGNOSIS — Z79899 Other long term (current) drug therapy: Secondary | ICD-10-CM

## 2017-05-24 DIAGNOSIS — M069 Rheumatoid arthritis, unspecified: Secondary | ICD-10-CM | POA: Insufficient documentation

## 2017-05-24 LAB — CBC WITH DIFFERENTIAL (CANCER CENTER ONLY)
Basophils Absolute: 0.1 10*3/uL (ref 0.0–0.1)
Basophils Relative: 1 %
EOS ABS: 0 10*3/uL (ref 0.0–0.5)
EOS PCT: 0 %
HCT: 32.3 % — ABNORMAL LOW (ref 34.8–46.6)
Hemoglobin: 10.3 g/dL — ABNORMAL LOW (ref 11.6–15.9)
LYMPHS ABS: 1.4 10*3/uL (ref 0.9–3.3)
Lymphocytes Relative: 14 %
MCH: 25.5 pg (ref 25.1–34.0)
MCHC: 31.7 g/dL (ref 31.5–36.0)
MCV: 80.3 fL (ref 79.5–101.0)
Monocytes Absolute: 0.6 10*3/uL (ref 0.1–0.9)
Monocytes Relative: 6 %
Neutro Abs: 8.1 10*3/uL — ABNORMAL HIGH (ref 1.5–6.5)
Neutrophils Relative %: 79 %
Platelet Count: 666 10*3/uL — ABNORMAL HIGH (ref 145–400)
RBC: 4.03 MIL/uL (ref 3.70–5.45)
RDW: 19.1 % — AB (ref 11.2–14.5)
WBC Count: 10.2 10*3/uL (ref 3.9–10.3)

## 2017-05-24 NOTE — Progress Notes (Signed)
Patient Care Team: Wenda Low, MD as PCP - General (Internal Medicine)  DIAGNOSIS:  Encounter Diagnosis  Name Primary?  . Thrombocytosis ([place]) Yes    CHIEF COMPLIANT: Follow-up of thrombocytosis  INTERVAL HISTORY: Leslie Gamble is a 77 year old with above-mentioned history of thrombocytosis was here in a wheelchair.  She has rheumatoid arthritis for which she is been on 2 medications lately.  Her rheumatoid arthritis has not yet been fully controlled.  Denies any nausea vomiting.  Denies any fevers or chills.  REVIEW OF SYSTEMS:   Constitutional: Denies fevers, chills or abnormal weight loss Eyes: Denies blurriness of vision Ears, nose, mouth, throat, and face: Denies mucositis or sore throat Respiratory: Denies cough, dyspnea or wheezes Cardiovascular: Denies palpitation, chest discomfort Gastrointestinal:  Denies nausea, heartburn or change in bowel habits Skin: Denies abnormal skin rashes Lymphatics: Denies new lymphadenopathy or easy bruising Neurological:Denies numbness, tingling or new weaknesses Behavioral/Psych: Mood is stable, no new changes  Extremities: No lower extremity edema Breast:  denies any pain or lumps or nodules in either breasts All other systems were reviewed with the patient and are negative.  I have reviewed the past medical history, past surgical history, social history and family history with the patient and they are unchanged from previous note.  ALLERGIES:  is allergic to avelox [moxifloxacin hcl in nacl]; cephalexin; ciprofloxacin; clindamycin hcl; codeine; diflucan [fluconazole]; lidocaine; metronidazole; nitrofurantoin monohyd macro; other; penicillins; shrimp [shellfish allergy]; sulfa antibiotics; tetracyclines & related; and tramadol hcl.  MEDICATIONS:  Current Outpatient Medications  Medication Sig Dispense Refill  . acetaminophen (TYLENOL) 500 MG tablet Take 1,000 mg by mouth every 6 (six) hours as needed for headache.    . albuterol  (PROVENTIL HFA;VENTOLIN HFA) 108 (90 Base) MCG/ACT inhaler Inhale 1-2 puffs into the lungs every 6 (six) hours as needed for wheezing or shortness of breath. 1 Inhaler 0  . ALPRAZolam (XANAX) 0.5 MG tablet Take 1 tablet (0.5 mg total) by mouth 2 (two) times daily as needed for anxiety. 10 tablet 0  . amLODipine (NORVASC) 10 MG tablet Take 10 mg by mouth every morning.     . DULoxetine (CYMBALTA) 30 MG capsule Take 30 mg by mouth See admin instructions. Take two tablets in the morning then take one at night until Wednesday take two in the morning and two at night    . ferrous sulfate 325 (65 FE) MG EC tablet Take 325 mg by mouth daily with breakfast.    . insulin aspart (NOVOLOG) 100 UNIT/ML injection Inject 0-9 Units into the skin 3 (three) times daily with meals. CBG < 70: implement hypoglycemia protocol CBG 70 - 120: 0 units CBG 121 - 150: 1 unit CBG 151 - 200: 2 units CBG 201 - 250: 3 units CBG 251 - 300: 5 units CBG 301 - 350: 7 units CBG 351 - 400: 9 units CBG > 400: call MD.    . losartan (COZAAR) 100 MG tablet Take 1 tablet (100 mg total) by mouth daily.    . metFORMIN (GLUCOPHAGE) 1000 MG tablet Take 1,000 mg by mouth 2 (two) times daily with a meal.    . mometasone-formoterol (DULERA) 100-5 MCG/ACT AERO Inhale 2 puffs into the lungs 2 (two) times daily. 1 Inhaler 5  . nicotine (NICODERM CQ) 14 mg/24hr patch Place 14 mg onto the skin daily.    . Oxycodone HCl 10 MG TABS Take 0.5 tablets (5 mg total) by mouth every 6 (six) hours as needed. For knee pain 6 tablet  0  . polyethylene glycol (MIRALAX / GLYCOLAX) packet Take 17 g by mouth daily. (Patient taking differently: Take 17 g by mouth daily as needed for mild constipation. ) 30 each 2  . Probiotic Product (ALIGN EXTRA STRENGTH) CAPS Take 1 capsule by mouth daily.    Marland Kitchen terconazole (TERAZOL 3) 0.8 % vaginal cream Place 1 applicator vaginally at bedtime. 20 g 0   Current Facility-Administered Medications  Medication Dose Route Frequency  Provider Last Rate Last Dose  . nystatin cream (MYCOSTATIN)   Topical BID Huel Cote, NP        PHYSICAL EXAMINATION: ECOG PERFORMANCE STATUS: 1 - Symptomatic but completely ambulatory  Vitals:   05/24/17 1510  BP: 140/84  Pulse: 80  Resp: 17  Temp: 97.6 F (36.4 C)  SpO2: 100%   Filed Weights   05/24/17 1510  Weight: 149 lb 3.2 oz (67.7 kg)    GENERAL:alert, no distress and comfortable SKIN: skin color, texture, turgor are normal, no rashes or significant lesions EYES: normal, Conjunctiva are pink and non-injected, sclera clear OROPHARYNX:no exudate, no erythema and lips, buccal mucosa, and tongue normal  NECK: supple, thyroid normal size, non-tender, without nodularity LYMPH:  no palpable lymphadenopathy in the cervical, axillary or inguinal LUNGS: clear to auscultation and percussion with normal breathing effort HEART: regular rate & rhythm and no murmurs and no lower extremity edema ABDOMEN:abdomen soft, non-tender and normal bowel sounds MUSCULOSKELETAL:no cyanosis of digits and no clubbing  NEURO: alert & oriented x 3 with fluent speech, no focal motor/sensory deficits EXTREMITIES: No lower extremity edema  LABORATORY DATA:  I have reviewed the data as listed CMP Latest Ref Rng & Units 12/27/2016 12/26/2016 12/25/2016  Glucose 65 - 99 mg/dL 150(H) 237(H) 386(H)  BUN 6 - 20 mg/dL 7 8 21(H)  Creatinine 0.44 - 1.00 mg/dL 0.72 0.82 1.01(H)  Sodium 135 - 145 mmol/L 138 134(L) 130(L)  Potassium 3.5 - 5.1 mmol/L 3.2(L) 3.9 4.0  Chloride 101 - 111 mmol/L 104 102 93(L)  CO2 22 - 32 mmol/L 25 21(L) 21(L)  Calcium 8.9 - 10.3 mg/dL 8.2(L) 8.3(L) 9.1  Total Protein 6.5 - 8.1 g/dL - 6.2(L) 7.2  Total Bilirubin 0.3 - 1.2 mg/dL - 0.8 0.7  Alkaline Phos 38 - 126 U/L - 84 90  AST 15 - 41 U/L - 22 18  ALT 14 - 54 U/L - 21 20    Lab Results  Component Value Date   WBC 10.2 05/24/2017   HGB 10.3 (L) 05/24/2017   HCT 32.3 (L) 05/24/2017   MCV 80.3 05/24/2017   PLT 666 (H)  05/24/2017   NEUTROABS 8.1 (H) 05/24/2017    ASSESSMENT & PLAN:  Thrombocytosis ([facility]) 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  Currently on anti-rheumatoid arthritis medications.  Blood work review: Platelet count 666 which is increased from 576.  I discussed with the patient that I still strongly believe that it is related to inflammation. At these levels, patient is not attending enormous risk of thrombosis.  I recommended watchful monitoring with recheck in 6 months.    Orders Placed This Encounter  Procedures  . CBC with Differential (Cancer Center Only)    Standing Status:   Future    Standing Expiration Date:   05/25/2018   The patient has a good understanding of the overall plan. she agrees with it. she will call with any problems that may develop before the next visit here.   Harriette Ohara, MD 05/24/17

## 2017-05-24 NOTE — Assessment & Plan Note (Signed)
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  Currently on anti-rheumatoid arthritis medications.  Blood work review: Platelet count 666 which is increased from 576.  I discussed with the patient that I still strongly believe that it is related to inflammation. At these levels, patient is not attending enormous risk of thrombosis.  I recommended watchful monitoring with recheck in 6 months.

## 2017-05-24 NOTE — Telephone Encounter (Signed)
Gave patient AVs and calendar of upcoming November appointments.  °

## 2017-06-15 DIAGNOSIS — Z79899 Other long term (current) drug therapy: Secondary | ICD-10-CM | POA: Diagnosis not present

## 2017-06-15 DIAGNOSIS — M0579 Rheumatoid arthritis with rheumatoid factor of multiple sites without organ or systems involvement: Secondary | ICD-10-CM | POA: Diagnosis not present

## 2017-06-23 DIAGNOSIS — Z79899 Other long term (current) drug therapy: Secondary | ICD-10-CM | POA: Diagnosis not present

## 2017-06-23 DIAGNOSIS — M2550 Pain in unspecified joint: Secondary | ICD-10-CM | POA: Diagnosis not present

## 2017-06-23 DIAGNOSIS — M0579 Rheumatoid arthritis with rheumatoid factor of multiple sites without organ or systems involvement: Secondary | ICD-10-CM | POA: Diagnosis not present

## 2017-06-23 DIAGNOSIS — Z6822 Body mass index (BMI) 22.0-22.9, adult: Secondary | ICD-10-CM | POA: Diagnosis not present

## 2017-06-23 DIAGNOSIS — M150 Primary generalized (osteo)arthritis: Secondary | ICD-10-CM | POA: Diagnosis not present

## 2017-07-09 ENCOUNTER — Encounter

## 2017-07-09 ENCOUNTER — Ambulatory Visit (HOSPITAL_COMMUNITY): Payer: No Typology Code available for payment source | Admitting: Psychiatry

## 2017-07-12 DIAGNOSIS — K219 Gastro-esophageal reflux disease without esophagitis: Secondary | ICD-10-CM | POA: Diagnosis not present

## 2017-07-12 DIAGNOSIS — F5104 Psychophysiologic insomnia: Secondary | ICD-10-CM | POA: Diagnosis not present

## 2017-07-12 DIAGNOSIS — F324 Major depressive disorder, single episode, in partial remission: Secondary | ICD-10-CM | POA: Diagnosis not present

## 2017-07-12 DIAGNOSIS — E1140 Type 2 diabetes mellitus with diabetic neuropathy, unspecified: Secondary | ICD-10-CM | POA: Diagnosis not present

## 2017-07-12 DIAGNOSIS — M069 Rheumatoid arthritis, unspecified: Secondary | ICD-10-CM | POA: Diagnosis not present

## 2017-07-12 DIAGNOSIS — D473 Essential (hemorrhagic) thrombocythemia: Secondary | ICD-10-CM | POA: Diagnosis not present

## 2017-07-12 DIAGNOSIS — J449 Chronic obstructive pulmonary disease, unspecified: Secondary | ICD-10-CM | POA: Diagnosis not present

## 2017-07-12 DIAGNOSIS — I10 Essential (primary) hypertension: Secondary | ICD-10-CM | POA: Diagnosis not present

## 2017-07-12 DIAGNOSIS — Z794 Long term (current) use of insulin: Secondary | ICD-10-CM | POA: Diagnosis not present

## 2017-07-16 ENCOUNTER — Ambulatory Visit (INDEPENDENT_AMBULATORY_CARE_PROVIDER_SITE_OTHER): Payer: Medicare Other | Admitting: Podiatry

## 2017-07-16 ENCOUNTER — Encounter: Payer: Self-pay | Admitting: Podiatry

## 2017-07-16 DIAGNOSIS — M79674 Pain in right toe(s): Secondary | ICD-10-CM | POA: Diagnosis not present

## 2017-07-16 DIAGNOSIS — E1142 Type 2 diabetes mellitus with diabetic polyneuropathy: Secondary | ICD-10-CM | POA: Diagnosis not present

## 2017-07-16 DIAGNOSIS — B351 Tinea unguium: Secondary | ICD-10-CM | POA: Diagnosis not present

## 2017-07-16 DIAGNOSIS — M79675 Pain in left toe(s): Secondary | ICD-10-CM

## 2017-07-16 NOTE — Progress Notes (Signed)
Subjective: [name] is a 77 y.o. AAF who presents today with diabetes, diabetic neuropathy and routine follow up of painful, discolored, thick toenails which interfere with activities of daily living Pain is aggravated when wearing enclosed shoe gear. Pain is getting progressively worse and relieved with periodic professional debridement.  She states she has been hospitalized recently and now has been diagnosed with rheumatoid arthritis.  Objective: There were no vitals filed for this visit. Pt is AAO x 3. 77 y.o. AAF, WD, WN in NAD.  Vascular Examination: Capillary refill time <3 seconds x 10 digits Dorsalis pedis and Posterior tibial pulses present b/l No digital hair x 10 digits Skin temperature warm b/l and symmetrically.  Dermatological Examination: Skin with normal turgor, texture and tone b/l Toenails 1-5 b/l discolored, thick, dystrophic with subungual debris and pain with palpation to nailbeds due to thickness of nails. No signs of infection noted.  Musculoskeletal: Muscle strength 5/5 to all LE muscle groups  Neurological: Sensation diminished with 10 gram monofilament.  Assessment: 1. Painful onychomycosis toenails 1-5 b/l 2. NIDDM with Diabetic neuropathy  Plan: 1. Continue diabetic foot care principles.  2. Toenails 1-5 b/l were debrided in length and girth without iatrogenic bleeding. 3. Patient to continue soft, supportive shoe gear 4. Patient to report any pedal injuries to medical professional  5. Follow up 3 months.  6. Patient to call should there be a concern in the interim.

## 2017-07-31 ENCOUNTER — Ambulatory Visit (INDEPENDENT_AMBULATORY_CARE_PROVIDER_SITE_OTHER): Payer: Medicare Other | Admitting: Psychiatry

## 2017-07-31 ENCOUNTER — Ambulatory Visit (HOSPITAL_COMMUNITY): Payer: No Typology Code available for payment source | Admitting: Psychiatry

## 2017-07-31 ENCOUNTER — Encounter (HOSPITAL_COMMUNITY): Payer: Self-pay | Admitting: Psychiatry

## 2017-07-31 ENCOUNTER — Other Ambulatory Visit: Payer: Self-pay

## 2017-07-31 VITALS — Ht 65.0 in | Wt 151.0 lb

## 2017-07-31 DIAGNOSIS — F331 Major depressive disorder, recurrent, moderate: Secondary | ICD-10-CM | POA: Diagnosis not present

## 2017-07-31 DIAGNOSIS — F322 Major depressive disorder, single episode, severe without psychotic features: Secondary | ICD-10-CM

## 2017-07-31 DIAGNOSIS — F411 Generalized anxiety disorder: Secondary | ICD-10-CM | POA: Diagnosis not present
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
## 2017-07-31 NOTE — Patient Instructions (Addendum)
Continue cymbalta.  Slowly getting better in regard to depression Family and herself wants to hold off adding any more anti depressant for now Can consider remeron or wellbutrin by next visit Keep caution of using xanax unless its prn

## 2017-08-11 DIAGNOSIS — M069 Rheumatoid arthritis, unspecified: Secondary | ICD-10-CM | POA: Diagnosis not present

## 2017-08-11 DIAGNOSIS — I10 Essential (primary) hypertension: Secondary | ICD-10-CM | POA: Diagnosis not present

## 2017-08-11 DIAGNOSIS — E1165 Type 2 diabetes mellitus with hyperglycemia: Secondary | ICD-10-CM | POA: Diagnosis not present

## 2017-08-11 DIAGNOSIS — J45909 Unspecified asthma, uncomplicated: Secondary | ICD-10-CM | POA: Diagnosis not present

## 2017-08-11 DIAGNOSIS — M1990 Unspecified osteoarthritis, unspecified site: Secondary | ICD-10-CM | POA: Diagnosis not present

## 2017-08-11 DIAGNOSIS — Z7984 Long term (current) use of oral hypoglycemic drugs: Secondary | ICD-10-CM | POA: Diagnosis not present

## 2017-08-11 DIAGNOSIS — F324 Major depressive disorder, single episode, in partial remission: Secondary | ICD-10-CM | POA: Diagnosis not present

## 2017-08-11 DIAGNOSIS — J449 Chronic obstructive pulmonary disease, unspecified: Secondary | ICD-10-CM | POA: Diagnosis not present

## 2017-08-11 DIAGNOSIS — M179 Osteoarthritis of knee, unspecified: Secondary | ICD-10-CM | POA: Diagnosis not present

## 2017-08-18 DIAGNOSIS — M0579 Rheumatoid arthritis with rheumatoid factor of multiple sites without organ or systems involvement: Secondary | ICD-10-CM | POA: Diagnosis not present

## 2017-08-26 DIAGNOSIS — Z79899 Other long term (current) drug therapy: Secondary | ICD-10-CM | POA: Diagnosis not present

## 2017-08-26 DIAGNOSIS — M0579 Rheumatoid arthritis with rheumatoid factor of multiple sites without organ or systems involvement: Secondary | ICD-10-CM | POA: Diagnosis not present

## 2017-08-26 DIAGNOSIS — Z6822 Body mass index (BMI) 22.0-22.9, adult: Secondary | ICD-10-CM | POA: Diagnosis not present

## 2017-08-26 DIAGNOSIS — M2550 Pain in unspecified joint: Secondary | ICD-10-CM | POA: Diagnosis not present

## 2017-08-26 DIAGNOSIS — M150 Primary generalized (osteo)arthritis: Secondary | ICD-10-CM | POA: Diagnosis not present

## 2017-09-03 DIAGNOSIS — R1033 Periumbilical pain: Secondary | ICD-10-CM | POA: Diagnosis not present

## 2017-09-03 DIAGNOSIS — R350 Frequency of micturition: Secondary | ICD-10-CM | POA: Diagnosis not present

## 2017-09-03 DIAGNOSIS — R05 Cough: Secondary | ICD-10-CM | POA: Diagnosis not present

## 2017-09-06 DIAGNOSIS — I10 Essential (primary) hypertension: Secondary | ICD-10-CM | POA: Diagnosis not present

## 2017-09-06 DIAGNOSIS — E1165 Type 2 diabetes mellitus with hyperglycemia: Secondary | ICD-10-CM | POA: Diagnosis not present

## 2017-09-06 DIAGNOSIS — M069 Rheumatoid arthritis, unspecified: Secondary | ICD-10-CM | POA: Diagnosis not present

## 2017-09-06 DIAGNOSIS — Z794 Long term (current) use of insulin: Secondary | ICD-10-CM | POA: Diagnosis not present

## 2017-09-06 DIAGNOSIS — E119 Type 2 diabetes mellitus without complications: Secondary | ICD-10-CM | POA: Diagnosis not present

## 2017-09-06 DIAGNOSIS — J45909 Unspecified asthma, uncomplicated: Secondary | ICD-10-CM | POA: Diagnosis not present

## 2017-09-06 DIAGNOSIS — E1140 Type 2 diabetes mellitus with diabetic neuropathy, unspecified: Secondary | ICD-10-CM | POA: Diagnosis not present

## 2017-09-06 DIAGNOSIS — J449 Chronic obstructive pulmonary disease, unspecified: Secondary | ICD-10-CM | POA: Diagnosis not present

## 2017-09-06 DIAGNOSIS — M1990 Unspecified osteoarthritis, unspecified site: Secondary | ICD-10-CM | POA: Diagnosis not present

## 2017-09-06 DIAGNOSIS — M179 Osteoarthritis of knee, unspecified: Secondary | ICD-10-CM | POA: Diagnosis not present

## 2017-09-06 DIAGNOSIS — F324 Major depressive disorder, single episode, in partial remission: Secondary | ICD-10-CM | POA: Diagnosis not present

## 2017-09-08 ENCOUNTER — Other Ambulatory Visit: Payer: Self-pay | Admitting: Internal Medicine

## 2017-09-08 ENCOUNTER — Ambulatory Visit
Admission: RE | Admit: 2017-09-08 | Discharge: 2017-09-08 | Disposition: A | Discharge status: Home / Self Care | Payer: Medicare Other | Source: Ambulatory Visit | Attending: Internal Medicine | Admitting: Internal Medicine

## 2017-09-08 DIAGNOSIS — R1084 Generalized abdominal pain: Secondary | ICD-10-CM

## 2017-09-08 DIAGNOSIS — R109 Unspecified abdominal pain: Secondary | ICD-10-CM | POA: Diagnosis not present

## 2017-09-08 DIAGNOSIS — R1030 Lower abdominal pain, unspecified: Secondary | ICD-10-CM | POA: Diagnosis not present

## 2017-09-21 ENCOUNTER — Ambulatory Visit (HOSPITAL_COMMUNITY): Payer: Self-pay | Admitting: Psychiatry

## 2017-09-24 ENCOUNTER — Ambulatory Visit (HOSPITAL_COMMUNITY): Payer: Self-pay | Admitting: Psychiatry

## 2017-09-28 ENCOUNTER — Ambulatory Visit (HOSPITAL_COMMUNITY): Payer: Self-pay | Admitting: Psychiatry

## 2017-09-28 ENCOUNTER — Ambulatory Visit (INDEPENDENT_AMBULATORY_CARE_PROVIDER_SITE_OTHER): Payer: Medicare Other | Admitting: Psychiatry

## 2017-09-28 DIAGNOSIS — F322 Major depressive disorder, single episode, severe without psychotic features: Secondary | ICD-10-CM

## 2017-09-28 NOTE — Progress Notes (Signed)
Comprehensive Clinical Assessment (CCA) Note  09/28/2017 AUNE ADAMI [id number]  Visit Diagnosis:      ICD-10-CM   1. Severe depression ([place]) F32.2       CCA Part One  Part One has been completed on paper by the patient.  (See scanned document in Chart Review)  CCA Part Two A  Intake/Chief Complaint:  CCA Intake With Chief Complaint CCA Part Two Date: 09/28/17 Chief Complaint/Presenting Problem: depression and anxiety Collateral Involvement: daughter, Lester [place] Individual's Strengths: open to therapy Type of Services Patient Feels Are Needed: medication management, therapy, provided information about the PACE program Initial Clinical Notes/Concerns: loneliness  Mental Health Symptoms Depression:  Depression: Fatigue, Change in energy/activity, Difficulty Concentrating, Increase/decrease in appetite, Irritability, Tearfulness  Mania:     Anxiety:   Anxiety: Fatigue, Difficulty concentrating, Tension, Worrying  Psychosis:     Trauma:  Trauma: N/A  Obsessions:  Obsessions: N/A  Compulsions:  Compulsions: N/A  Inattention:  Inattention: N/A  Hyperactivity/Impulsivity:  Hyperactivity/Impulsivity: N/A  Oppositional/Defiant Behaviors:  Oppositional/Defiant Behaviors: N/A  Borderline Personality:  Emotional Irregularity: Chronic feelings of emptiness  Other Mood/Personality Symptoms:      Mental Status Exam Appearance and self-care  Stature:  Stature: Average  Weight:  Weight: Average weight  Clothing:  Clothing: Casual  Grooming:  Grooming: Normal  Cosmetic use:  Cosmetic Use: Age appropriate  Posture/gait:  Posture/Gait: Normal  Motor activity:  Motor Activity: Slowed  Sensorium  Attention:  Attention: Normal  Concentration:  Concentration: Normal  Orientation:  Orientation: X5  Recall/memory:  Recall/Memory: Defective in short-term  Affect and Mood  Affect:  Affect: Appropriate, Depressed  Mood:  Mood: Depressed  Relating  Eye contact:  Eye Contact: Normal  Facial  expression:  Facial Expression: Depressed  Attitude toward examiner:  Attitude Toward Examiner: Cooperative  Thought and Language  Speech flow: Speech Flow: Normal  Thought content:  Thought Content: Appropriate to mood and circumstances  Preoccupation:     Hallucinations:     Organization:     Transport planner of Knowledge:  Fund of Knowledge: Average  Intelligence:  Intelligence: Average  Abstraction:  Abstraction: Normal  Judgement:  Judgement: Normal  Reality Testing:  Reality Testing: Realistic  Insight:  Insight: Good  Decision Making:  Decision Making: Normal  Social Functioning  Social Maturity:  Social Maturity: Responsible  Social Judgement:  Social Judgement: Normal  Stress  Stressors:  Stressors: Transitions, Illness, Grief/losses  Coping Ability:  Coping Ability: English as a second language teacher Deficits:     Supports:      Family and Psychosocial History: Family history Marital status: Widowed Widowed, when?: 24 years ago Are you sexually active?: No Does patient have children?: Yes How many children?: 3 How is patient's relationship with their children?: daughter lives in [place], son lives with her since getting sick in February  Childhood History:  Childhood History By whom was/is the patient raised?: Other (Comment) Additional childhood history information: Pt. was raised by great grandparents until 74 years old Does patient have siblings?: Yes Description of patient's current relationship with siblings: deceased Did patient suffer any verbal/emotional/physical/sexual abuse as a child?: No Did patient suffer from severe childhood neglect?: No Has patient ever been sexually abused/assaulted/raped as an adolescent or adult?: No Was the patient ever a victim of a crime or a disaster?: No Witnessed domestic violence?: No Has patient been effected by domestic violence as an adult?: No  CCA Part Two B  Employment/Work Situation: Employment / Work  Copywriter, advertising Employment situation: Retired  Patient's job has been impacted by current illness: No What is the longest time patient has a held a job?: had a Brewing technologist for many years, 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 Did You Receive Any Psychiatric Treatment/Services While in the [employer]?: No Are There Guns or Other Weapons in [place]?: No Are These Psychologist, educational?: Yes  Education: Museum/gallery curator Currently Attending: n/a Did You Have An Individualized Education Program (IIEP): No Did You Have Any Difficulty At [employer]?: No  Religion: Religion/Spirituality Are You A Religious Person?: Yes What is Your Religious Affiliation?: International aid/development worker: Leisure / Recreation Leisure and Hobbies: none at the moment  Exercise/Diet: Exercise/Diet Do You Exercise?: No Have You Gained or Lost A Significant Amount of Weight in the Past Six Months?: Yes-Lost Number of Pounds Lost?: 40 Do You Follow a Special Diet?: No Do You Have Any Trouble Sleeping?: No  CCA Part Two C  Alcohol/Drug Use: Alcohol / Drug Use Pain Medications: oxycontin Prescriptions: injection for rheumatoid arthritis Over the Counter: tylenol History of alcohol / drug use?: No history of alcohol / drug abuse                      CCA Part Three  ASAM's:  Six Dimensions of Multidimensional Assessment  Dimension 1:  Acute Intoxication and/or Withdrawal Potential:     Dimension 2:  Biomedical Conditions and Complications:     Dimension 3:  Emotional, Behavioral, or Cognitive Conditions and Complications:     Dimension 4:  Readiness to Change:     Dimension 5:  Relapse, Continued use, or Continued Problem Potential:     Dimension 6:  Recovery/Living Environment:      Substance use Disorder (SUD)    Social Function:  Social Functioning Social Maturity: Responsible Social Judgement: Normal  Stress:  Stress Stressors: Transitions, Illness, Grief/losses Coping Ability:  Overwhelmed Patient Takes Medications The Way The Doctor Instructed?: Yes Priority Risk: Moderate Risk  Risk Assessment- Self-Harm Potential: Risk Assessment For Self-Harm Potential Thoughts of Self-Harm: No current thoughts Method: No plan Availability of Means: No access/NA  Risk Assessment -Dangerous to Others Potential: Risk Assessment For Dangerous to Others Potential Method: No Plan Availability of Means: No access or NA Intent: Vague intent or NA Notification Required: No need or identified person  DSM5 Diagnoses: Patient Active Problem List   Diagnosis Date Noted  . Thrombocytosis ([place]) 02/22/2017  . AKI (acute kidney injury) ([place]) 12/26/2016  . Acute kidney injury ([place]) 12/25/2016  . Diabetes mellitus type 2, uncontrolled ([place]) 12/25/2016  . Asthma 12/25/2016  . Severe depression ([place]) 12/25/2016  . Junctional escape rhythm 12/25/2016  . RUQ abdominal pain 12/25/2016  . Chronic pain 12/25/2016  . Hypomagnesemia 12/25/2016  . Acute blood loss anemia 05/08/2015  . Hematochezia 05/07/2015  . Bleeding gastrointestinal   . Diabetes mellitus with complication ([place])   . Essential hypertension   . Fibroid uterus 05/17/2013  . Obesity 12/30/2011  . Smoker 12/30/2011  . Diabetes mellitus, type 2 ([place])   . Depression   . COPD GOLD II   . Arthritis   . Diverticulitis     Patient Centered Plan: Patient is on the following Treatment Plan(s): Pt. Referred to PACE program; pt. Scheduled for next available with Dr. Nicole Cella and with Anselmo Rod, LCSW  Recommendations for Services/Supports/Treatments: Recommendations for Services/Supports/Treatments Recommendations For Services/Supports/Treatments: Individual Therapy, Medication Management  Treatment Plan Summary: Pt. Was long time patient of Dr. Clovis Pu; Pt. Experienced adverse reaction to  lithium and was hospitalized, as a result insulin dependent diabetic and complications from rheumatoid arthritis. Pt. Reports  loneliness and ongoing depression. Pt. Has good family support of daughter Lester [place]) who lives in [place] and son who lives with her.    Referrals to Alternative Service(s): Referred to Alternative Service(s):   Place:   Date:   Time:    Referred to Alternative Service(s):   Place:   Date:   Time:    Referred to Alternative Service(s):   Place:   Date:   Time:    Referred to Alternative Service(s):   Place:   Date:   Time:     Nancie Neas

## 2017-10-22 ENCOUNTER — Encounter: Payer: Self-pay | Admitting: Podiatry

## 2017-10-22 ENCOUNTER — Ambulatory Visit (INDEPENDENT_AMBULATORY_CARE_PROVIDER_SITE_OTHER): Payer: Medicare Other | Admitting: Podiatry

## 2017-10-22 DIAGNOSIS — M79674 Pain in right toe(s): Secondary | ICD-10-CM

## 2017-10-22 DIAGNOSIS — B351 Tinea unguium: Secondary | ICD-10-CM

## 2017-10-22 DIAGNOSIS — M79675 Pain in left toe(s): Secondary | ICD-10-CM

## 2017-10-22 DIAGNOSIS — E1142 Type 2 diabetes mellitus with diabetic polyneuropathy: Secondary | ICD-10-CM

## 2017-10-24 NOTE — Progress Notes (Signed)
Subjective: Leslie Gamble presents today for follow up preventative foot care.  She is seen for painful, mycotic toenails which interfere with daily activities and routine tasks.  Pain is aggravated when wearing enclosed shoe gear and is relieved with periodic professional debridement.    Objective: Vascular Examination: Capillary refill time <3 seconds x 10 digits Dorsalis pedis pulses and Posterior tibial pulses present b/l No digital hair x 10 digits Skin temperature warm b/l and symmetrically  Dermatological Examination: Skin with normal turgor texture and tone b/l Toenails 1-5 b/l discolored, thick, dystrophic with subungual debris and pain with palpation to nailbeds due to thickness of nails.  Musculoskeletal: Muscle strength 5/5 to all LE muscle groups  Neurological: Sensation diminished with 10 gram monofilament. Vibratory sensation diminished  Assessment: 1. Painful onychomycosis toenails 1-5 b/l 2. Diabetic neuropathy  Plan: 1. Toenails 1-5 b/l were debrided in length and girth without iatrogenic bleeding. 2. Patient to continue soft, supportive shoe gear 3. Patient to report any pedal injuries to medical professional  4. Follow up 3 months. Patient/POA to call should there be a concern in the interim.

## 2017-10-26 DIAGNOSIS — J449 Chronic obstructive pulmonary disease, unspecified: Secondary | ICD-10-CM | POA: Diagnosis not present

## 2017-10-26 DIAGNOSIS — Z7189 Other specified counseling: Secondary | ICD-10-CM | POA: Diagnosis not present

## 2017-10-26 DIAGNOSIS — E1140 Type 2 diabetes mellitus with diabetic neuropathy, unspecified: Secondary | ICD-10-CM | POA: Diagnosis not present

## 2017-10-26 DIAGNOSIS — E119 Type 2 diabetes mellitus without complications: Secondary | ICD-10-CM | POA: Diagnosis not present

## 2017-10-26 DIAGNOSIS — Z0000 Encounter for general adult medical examination without abnormal findings: Secondary | ICD-10-CM | POA: Diagnosis not present

## 2017-10-26 DIAGNOSIS — F5104 Psychophysiologic insomnia: Secondary | ICD-10-CM | POA: Diagnosis not present

## 2017-10-26 DIAGNOSIS — I700 Atherosclerosis of aorta: Secondary | ICD-10-CM | POA: Diagnosis not present

## 2017-10-26 DIAGNOSIS — I10 Essential (primary) hypertension: Secondary | ICD-10-CM | POA: Diagnosis not present

## 2017-10-26 DIAGNOSIS — F324 Major depressive disorder, single episode, in partial remission: Secondary | ICD-10-CM | POA: Diagnosis not present

## 2017-10-26 DIAGNOSIS — M069 Rheumatoid arthritis, unspecified: Secondary | ICD-10-CM | POA: Diagnosis not present

## 2017-10-26 DIAGNOSIS — Z23 Encounter for immunization: Secondary | ICD-10-CM | POA: Diagnosis not present

## 2017-10-26 DIAGNOSIS — Z1389 Encounter for screening for other disorder: Secondary | ICD-10-CM | POA: Diagnosis not present

## 2017-10-26 DIAGNOSIS — K219 Gastro-esophageal reflux disease without esophagitis: Secondary | ICD-10-CM | POA: Diagnosis not present

## 2017-10-28 ENCOUNTER — Encounter (HOSPITAL_COMMUNITY): Payer: Self-pay | Admitting: Licensed Clinical Social Worker

## 2017-10-28 ENCOUNTER — Ambulatory Visit (INDEPENDENT_AMBULATORY_CARE_PROVIDER_SITE_OTHER): Payer: Medicare Other | Admitting: Licensed Clinical Social Worker

## 2017-10-28 DIAGNOSIS — F322 Major depressive disorder, single episode, severe without psychotic features: Secondary | ICD-10-CM

## 2017-10-28 NOTE — Progress Notes (Signed)
   THERAPIST PROGRESS NOTE  Session Time: 2:30pm-3:30pm  Participation Level: Active  Behavioral Response: Well GroomedAlertDepressed  Type of Therapy: Family Therapy  Treatment Goals addressed: "help with my depression, get me more motivated to get out of the house"  Interventions: Motivational Interviewing, CBT, psycho-education, Mindfulness and Grounding techniques  Summary: Mikaya Bunner is a 77 y.o. female who presents with Major Depressive Disorder, severe, without psychotic features    Suicidal/Homicidal: No without intent/plan  Therapist Response: Xolani and daughter met with clinician for a family session. Jakalyn discussed her psychiatric symptoms, her current life events and her homework. Kaylamarie identified increased depression following her hospitalization and subsequent stay in a nursing facility early this year. She reports following this experience, she has experienced a lack of motivation, increased isolation, and increased thoughts about death. Clinician assessed for SI, but none was present. Clinician utilized reality testing about age and noted many changes in physical health and mobility. Clinician normalized these feelings and expressed importance of completing "living will" and advanced directives. Levette, Neva's daughter identified interest in seeking out in-home care. Clinician encouraged daughter to connect with case worker through [facility] and [facility] to see if there is support available. Clinician also encouraged Camauri to find activities out of the home, such as the [facility] or [facility]   Plan: Return again in 2-3 weeks.  Diagnosis:     Axis I: Major Depressive Disorder, severe, without psychotic features     Mindi Curling, LCSW 10/28/2017

## 2017-11-01 ENCOUNTER — Ambulatory Visit (INDEPENDENT_AMBULATORY_CARE_PROVIDER_SITE_OTHER): Payer: Medicare Other | Admitting: Psychiatry

## 2017-11-01 ENCOUNTER — Encounter (HOSPITAL_COMMUNITY): Payer: Self-pay | Admitting: Psychiatry

## 2017-11-01 VITALS — BP 136/78 | HR 70 | Ht 65.0 in | Wt 149.0 lb

## 2017-11-01 DIAGNOSIS — F322 Major depressive disorder, single episode, severe without psychotic features: Secondary | ICD-10-CM

## 2017-11-01 DIAGNOSIS — F411 Generalized anxiety disorder: Secondary | ICD-10-CM | POA: Diagnosis not present

## 2017-11-01 DIAGNOSIS — F331 Major depressive disorder, recurrent, moderate: Secondary | ICD-10-CM

## 2017-11-01 MED ORDER — MIRTAZAPINE 15 MG PO TABS: 15.0000 mg | ORAL_TABLET | Freq: Every day | ORAL | 1 refills | Status: DC

## 2017-11-01 MED ORDER — DULOXETINE HCL 60 MG PO CPEP: 60.0000 mg | ORAL_CAPSULE | Freq: Two times a day (BID) | ORAL | 1 refills | Status: DC
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
## 2017-11-03 ENCOUNTER — Emergency Department (HOSPITAL_COMMUNITY)
Admission: EM | Admit: 2017-11-03 | Discharge: 2017-11-03 | Disposition: A | Discharge status: Home / Self Care | Payer: Medicare Other | Attending: Emergency Medicine | Admitting: Emergency Medicine

## 2017-11-03 ENCOUNTER — Other Ambulatory Visit: Payer: Self-pay

## 2017-11-03 ENCOUNTER — Encounter (HOSPITAL_COMMUNITY): Payer: Self-pay | Admitting: Registered Nurse

## 2017-11-03 DIAGNOSIS — Z87891 Personal history of nicotine dependence: Secondary | ICD-10-CM | POA: Diagnosis not present

## 2017-11-03 DIAGNOSIS — R51 Headache: Secondary | ICD-10-CM | POA: Insufficient documentation

## 2017-11-03 DIAGNOSIS — Z794 Long term (current) use of insulin: Secondary | ICD-10-CM | POA: Insufficient documentation

## 2017-11-03 DIAGNOSIS — Z7982 Long term (current) use of aspirin: Secondary | ICD-10-CM | POA: Diagnosis not present

## 2017-11-03 DIAGNOSIS — E119 Type 2 diabetes mellitus without complications: Secondary | ICD-10-CM | POA: Diagnosis not present

## 2017-11-03 DIAGNOSIS — D473 Essential (hemorrhagic) thrombocythemia: Secondary | ICD-10-CM | POA: Insufficient documentation

## 2017-11-03 DIAGNOSIS — M545 Low back pain, unspecified: Secondary | ICD-10-CM

## 2017-11-03 DIAGNOSIS — F332 Major depressive disorder, recurrent severe without psychotic features: Secondary | ICD-10-CM | POA: Diagnosis not present

## 2017-11-03 DIAGNOSIS — I10 Essential (primary) hypertension: Secondary | ICD-10-CM | POA: Insufficient documentation

## 2017-11-03 DIAGNOSIS — R519 Headache, unspecified: Secondary | ICD-10-CM

## 2017-11-03 DIAGNOSIS — D649 Anemia, unspecified: Secondary | ICD-10-CM | POA: Diagnosis not present

## 2017-11-03 DIAGNOSIS — D75839 Thrombocytosis, unspecified: Secondary | ICD-10-CM

## 2017-11-03 DIAGNOSIS — R45850 Homicidal ideations: Secondary | ICD-10-CM | POA: Insufficient documentation

## 2017-11-03 DIAGNOSIS — J449 Chronic obstructive pulmonary disease, unspecified: Secondary | ICD-10-CM | POA: Insufficient documentation

## 2017-11-03 DIAGNOSIS — Z79899 Other long term (current) drug therapy: Secondary | ICD-10-CM | POA: Insufficient documentation

## 2017-11-03 LAB — COMPREHENSIVE METABOLIC PANEL
ALBUMIN: 3.1 g/dL — AB (ref 3.5–5.0)
ALT: 6 U/L (ref 0–44)
ANION GAP: 13 (ref 5–15)
AST: 17 U/L (ref 15–41)
Alkaline Phosphatase: 77 U/L (ref 38–126)
BILIRUBIN TOTAL: 0.4 mg/dL (ref 0.3–1.2)
BUN: 15 mg/dL (ref 8–23)
CO2: 24 mmol/L (ref 22–32)
Calcium: 9 mg/dL (ref 8.9–10.3)
Chloride: 104 mmol/L (ref 98–111)
Creatinine, Ser: 0.66 mg/dL (ref 0.44–1.00)
GFR calc Af Amer: >60 mL/min (ref >60)
GFR calc non Af Amer: >60 mL/min (ref >60)
GLUCOSE: 97 mg/dL (ref 70–99)
POTASSIUM: 3.3 mmol/L — AB (ref 3.5–5.1)
Sodium: 141 mmol/L (ref 135–145)
TOTAL PROTEIN: 7.1 g/dL (ref 6.5–8.1)

## 2017-11-03 LAB — URINALYSIS, ROUTINE W REFLEX MICROSCOPIC
BILIRUBIN URINE: NEGATIVE
Bacteria, UA: NONE SEEN
Glucose, UA: NEGATIVE mg/dL
HGB URINE DIPSTICK: NEGATIVE
KETONES UR: NEGATIVE mg/dL
NITRITE: NEGATIVE
Protein, ur: 30 mg/dL — AB
Specific Gravity, Urine: 1.014 (ref 1.005–1.030)
pH: 6 (ref 5.0–8.0)

## 2017-11-03 LAB — CBC
HCT: 35.3 % — ABNORMAL LOW (ref 36.0–46.0)
HEMOGLOBIN: 10.5 g/dL — AB (ref 12.0–15.0)
MCH: 25.2 pg — AB (ref 26.0–34.0)
MCHC: 29.7 g/dL — ABNORMAL LOW (ref 30.0–36.0)
MCV: 84.9 fL (ref 80.0–100.0)
Platelets: 498 10*3/uL — ABNORMAL HIGH (ref 150–400)
RBC: 4.16 MIL/uL (ref 3.87–5.11)
RDW: 16.7 % — ABNORMAL HIGH (ref 11.5–15.5)
WBC: 11 10*3/uL — AB (ref 4.0–10.5)
nRBC: 0 % (ref 0.0–0.2)

## 2017-11-03 LAB — RAPID URINE DRUG SCREEN, HOSP PERFORMED
Amphetamines: NOT DETECTED
Barbiturates: NOT DETECTED
Benzodiazepines: NOT DETECTED
Cocaine: NOT DETECTED
OPIATES: NOT DETECTED
Tetrahydrocannabinol: NOT DETECTED

## 2017-11-03 LAB — CBG MONITORING, ED: Glucose-Capillary: 174 mg/dL — ABNORMAL HIGH (ref 70–99)

## 2017-11-03 MED ORDER — ASPIRIN 81 MG PO CHEW
81.0000 mg | CHEWABLE_TABLET | Freq: Every day | ORAL | Status: DC
  Administered 2017-11-03: 81 mg via ORAL
  Filled 2017-11-03: qty 1

## 2017-11-03 MED ORDER — FLUTICASONE FUROATE-VILANTEROL 100-25 MCG/INH IN AEPB
1.0000 | INHALATION_SPRAY | Freq: Every day | RESPIRATORY_TRACT | Status: DC
  Administered 2017-11-03: 1 via RESPIRATORY_TRACT
  Filled 2017-11-03: qty 28

## 2017-11-03 MED ORDER — ALUM & MAG HYDROXIDE-SIMETH 200-200-20 MG/5ML PO SUSP: 30.0000 mL | Freq: Four times a day (QID) | ORAL | Status: DC | PRN

## 2017-11-03 MED ORDER — POTASSIUM CHLORIDE CRYS ER 20 MEQ PO TBCR
40.0000 meq | EXTENDED_RELEASE_TABLET | Freq: Once | ORAL | Status: AC
  Administered 2017-11-03: 40 meq via ORAL
  Filled 2017-11-03: qty 2

## 2017-11-03 MED ORDER — MIRTAZAPINE 15 MG PO TABS: 15.0000 mg | ORAL_TABLET | Freq: Every day | ORAL | Status: DC

## 2017-11-03 MED ORDER — ONDANSETRON HCL 4 MG PO TABS: 4.0000 mg | ORAL_TABLET | Freq: Three times a day (TID) | ORAL | Status: DC | PRN

## 2017-11-03 MED ORDER — KETOROLAC TROMETHAMINE 30 MG/ML IJ SOLN
15.0000 mg | Freq: Once | INTRAMUSCULAR | Status: AC
  Administered 2017-11-03: 15 mg via INTRAVENOUS
  Filled 2017-11-03: qty 1

## 2017-11-03 MED ORDER — INSULIN ASPART 100 UNIT/ML ~~LOC~~ SOLN
0.0000 [IU] | Freq: Three times a day (TID) | SUBCUTANEOUS | Status: DC
  Administered 2017-11-03: 3 [IU] via SUBCUTANEOUS
  Filled 2017-11-03: qty 1

## 2017-11-03 MED ORDER — INSULIN ASPART 100 UNIT/ML ~~LOC~~ SOLN: 0.0000 [IU] | Freq: Three times a day (TID) | SUBCUTANEOUS | Status: DC

## 2017-11-03 MED ORDER — LEFLUNOMIDE 20 MG PO TABS
20.0000 mg | ORAL_TABLET | Freq: Every day | ORAL | Status: DC
  Administered 2017-11-03: 20 mg via ORAL
  Filled 2017-11-03: qty 1

## 2017-11-03 MED ORDER — DULOXETINE HCL 60 MG PO CPEP
60.0000 mg | ORAL_CAPSULE | Freq: Two times a day (BID) | ORAL | Status: DC
  Administered 2017-11-03: 60 mg via ORAL
  Filled 2017-11-03: qty 1

## 2017-11-03 MED ORDER — SODIUM CHLORIDE 0.9 % IV BOLUS
1000.0000 mL | Freq: Once | INTRAVENOUS | Status: AC
  Administered 2017-11-03: 1000 mL via INTRAVENOUS

## 2017-11-03 MED ORDER — AMLODIPINE BESYLATE 5 MG PO TABS
10.0000 mg | ORAL_TABLET | Freq: Every day | ORAL | Status: DC
  Administered 2017-11-03: 10 mg via ORAL
  Filled 2017-11-03: qty 2

## 2017-11-03 MED ORDER — MOMETASONE FURO-FORMOTEROL FUM 100-5 MCG/ACT IN AERO: 2.0000 | INHALATION_SPRAY | Freq: Two times a day (BID) | RESPIRATORY_TRACT | Status: DC

## 2017-11-03 MED ORDER — METOCLOPRAMIDE HCL 5 MG/ML IJ SOLN
10.0000 mg | Freq: Once | INTRAMUSCULAR | Status: AC
  Administered 2017-11-03: 10 mg via INTRAVENOUS
  Filled 2017-11-03: qty 2

## 2017-11-03 MED ORDER — ACETAMINOPHEN 325 MG PO TABS: 650.0000 mg | ORAL_TABLET | ORAL | Status: DC | PRN

## 2017-11-03 MED ORDER — METFORMIN HCL 500 MG PO TABS
1000.0000 mg | ORAL_TABLET | Freq: Two times a day (BID) | ORAL | Status: DC
  Administered 2017-11-03: 1000 mg via ORAL
  Filled 2017-11-03: qty 2

## 2017-11-03 MED ORDER — OXYCODONE HCL 5 MG PO TABS: 5.0000 mg | ORAL_TABLET | Freq: Four times a day (QID) | ORAL | Status: DC | PRN

## 2017-11-03 MED ORDER — FERROUS SULFATE 325 (65 FE) MG PO TABS
325.0000 mg | ORAL_TABLET | Freq: Every day | ORAL | Status: DC
  Administered 2017-11-03: 325 mg via ORAL
  Filled 2017-11-03 (×2): qty 1

## 2017-11-03 MED ORDER — DIPHENHYDRAMINE HCL 50 MG/ML IJ SOLN
25.0000 mg | Freq: Once | INTRAMUSCULAR | Status: AC
  Administered 2017-11-03: 25 mg via INTRAVENOUS
  Filled 2017-11-03: qty 1

## 2017-11-03 MED ORDER — PANTOPRAZOLE SODIUM 40 MG PO TBEC
40.0000 mg | DELAYED_RELEASE_TABLET | Freq: Every day | ORAL | Status: DC
  Administered 2017-11-03: 40 mg via ORAL
  Filled 2017-11-03: qty 1

## 2017-11-03 MED ORDER — ALBUTEROL SULFATE HFA 108 (90 BASE) MCG/ACT IN AERS: 1.0000 | INHALATION_SPRAY | Freq: Four times a day (QID) | RESPIRATORY_TRACT | Status: DC | PRN

## 2017-11-03 MED ORDER — LOSARTAN POTASSIUM 50 MG PO TABS
100.0000 mg | ORAL_TABLET | Freq: Every day | ORAL | Status: DC
  Administered 2017-11-03: 100 mg via ORAL
  Filled 2017-11-03: qty 2

## 2017-11-03 NOTE — ED Notes (Signed)
Pt given sandwhich

## 2017-11-03 NOTE — ED Notes (Signed)
Given coffee.. Waiting for meal tray to arrive.

## 2017-11-03 NOTE — ED Notes (Signed)
Pt up with walker to BR then c/o pain in back like she had when she first got here

## 2017-11-03 NOTE — ED Notes (Signed)
CBG 174  

## 2017-11-03 NOTE — ED Notes (Signed)
Called lab they will add UDS on

## 2017-11-03 NOTE — ED Notes (Signed)
Patient's personal belongings stored at [address] at [facility]. Security notified to wand pt.

## 2017-11-03 NOTE — ED Notes (Addendum)
Patient stated that she wants to hurt her son during a conversation with EDP , denies suicidal ideation or hallucinations. Patient placed on a maroon scrubs , security notified to wand pt. personal belongings inventoried by Therapist, sports .

## 2017-11-03 NOTE — ED Notes (Signed)
Pt set up  For repeat TTS in trauma C

## 2017-11-03 NOTE — ED Notes (Signed)
Pt  Given referrals per social worker
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
## 2017-11-03 NOTE — ED Notes (Signed)
TTS video interview in progress .

## 2017-11-03 NOTE — ED Triage Notes (Signed)
Patient c/o HA and back pain that started yesterday (intermittent).

## 2017-11-03 NOTE — ED Notes (Signed)
TTS interview ended .

## 2017-11-10 DIAGNOSIS — Z79899 Other long term (current) drug therapy: Secondary | ICD-10-CM | POA: Diagnosis not present

## 2017-11-10 DIAGNOSIS — M0579 Rheumatoid arthritis with rheumatoid factor of multiple sites without organ or systems involvement: Secondary | ICD-10-CM | POA: Diagnosis not present

## 2017-11-22 ENCOUNTER — Ambulatory Visit (INDEPENDENT_AMBULATORY_CARE_PROVIDER_SITE_OTHER): Payer: Medicare Other | Admitting: Licensed Clinical Social Worker

## 2017-11-22 ENCOUNTER — Inpatient Hospital Stay: Payer: Medicare Other | Attending: Hematology and Oncology

## 2017-11-22 ENCOUNTER — Encounter (HOSPITAL_COMMUNITY): Payer: Self-pay | Admitting: Licensed Clinical Social Worker

## 2017-11-22 ENCOUNTER — Inpatient Hospital Stay (HOSPITAL_BASED_OUTPATIENT_CLINIC_OR_DEPARTMENT_OTHER): Payer: Medicare Other | Admitting: Hematology and Oncology

## 2017-11-22 DIAGNOSIS — D473 Essential (hemorrhagic) thrombocythemia: Secondary | ICD-10-CM

## 2017-11-22 DIAGNOSIS — R7989 Other specified abnormal findings of blood chemistry: Secondary | ICD-10-CM

## 2017-11-22 DIAGNOSIS — M069 Rheumatoid arthritis, unspecified: Secondary | ICD-10-CM | POA: Insufficient documentation

## 2017-11-22 DIAGNOSIS — D75839 Thrombocytosis, unspecified: Secondary | ICD-10-CM

## 2017-11-22 DIAGNOSIS — Z79899 Other long term (current) drug therapy: Secondary | ICD-10-CM | POA: Diagnosis not present

## 2017-11-22 DIAGNOSIS — F322 Major depressive disorder, single episode, severe without psychotic features: Secondary | ICD-10-CM

## 2017-11-22 LAB — CBC WITH DIFFERENTIAL (CANCER CENTER ONLY)
Abs Immature Granulocytes: 0.02 10*3/uL (ref 0.00–0.07)
Basophils Absolute: 0.1 10*3/uL (ref 0.0–0.1)
Basophils Relative: 1 %
Eosinophils Absolute: 0 10*3/uL (ref 0.0–0.5)
Eosinophils Relative: 0 %
HCT: 35.7 % — ABNORMAL LOW (ref 36.0–46.0)
Hemoglobin: 11 g/dL — ABNORMAL LOW (ref 12.0–15.0)
Immature Granulocytes: 0 %
Lymphocytes Relative: 21 %
Lymphs Abs: 2 10*3/uL (ref 0.7–4.0)
MCH: 26.7 pg (ref 26.0–34.0)
MCHC: 30.8 g/dL (ref 30.0–36.0)
MCV: 86.7 fL (ref 80.0–100.0)
Monocytes Absolute: 0.7 10*3/uL (ref 0.1–1.0)
Monocytes Relative: 7 %
Neutro Abs: 7.1 10*3/uL (ref 1.7–7.7)
Neutrophils Relative %: 71 %
Platelet Count: 399 10*3/uL (ref 150–400)
RBC: 4.12 MIL/uL (ref 3.87–5.11)
RDW: 16.4 % — ABNORMAL HIGH (ref 11.5–15.5)
WBC Count: 9.9 10*3/uL (ref 4.0–10.5)
nRBC: 0 % (ref 0.0–0.2)

## 2017-11-22 NOTE — Assessment & Plan Note (Signed)
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  Currently on anti-rheumatoid arthritis medications.  Blood work review: Platelet count 399 Since the thrombocytosis has resolved, we can see her on an as-needed basis.
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
## 2017-11-22 NOTE — Progress Notes (Signed)
Patient Care Team: Wenda Low, MD as PCP - General (Internal Medicine)  DIAGNOSIS:  Encounter Diagnosis  Name Primary?  . Thrombocytosis ([place])     CHIEF COMPLIANT: Follow-up of thrombocytosis  INTERVAL HISTORY: Leslie Gamble is a 77 year old with above-mentioned history of thrombocytosis who had initial work-up and I felt that this was reactive to underlying inflammation.  She is here for six-month follow-up with repeat of her lab work that showed an improvement in the platelet count of 399.     REVIEW OF SYSTEMS:   Constitutional: Denies fevers, chills or abnormal weight loss Eyes: Denies blurriness of vision Ears, nose, mouth, throat, and face: Denies mucositis or sore throat Respiratory: Denies cough, dyspnea or wheezes Cardiovascular: Denies palpitation, chest discomfort Gastrointestinal:  Denies nausea, heartburn or change in bowel habits Skin: Denies abnormal skin rashes Lymphatics: Denies new lymphadenopathy or easy bruising Neurological:Denies numbness, tingling or new weaknesses Behavioral/Psych: Mood is stable, no new changes  Extremities: No lower extremity edema   All other systems were reviewed with the patient and are negative.  I have reviewed the past medical history, past surgical history, social history and family history with the patient and they are unchanged from previous note.  ALLERGIES:  is allergic to avelox [moxifloxacin hcl in nacl]; cephalexin; ciprofloxacin; clarithromycin; clindamycin hcl; codeine; diflucan [fluconazole]; lidocaine; metronidazole; nitrofurantoin monohyd macro; other; penicillins; shrimp [shellfish allergy]; sitagliptin; sulfa antibiotics; tetracyclines & related; and tramadol hcl.  MEDICATIONS:  Current Outpatient Medications  Medication Sig Dispense Refill  . acetaminophen (TYLENOL) 500 MG tablet Take 1,000 mg by mouth every 6 (six) hours as needed for headache.    . albuterol (PROVENTIL HFA;VENTOLIN HFA) 108 (90 Base)  MCG/ACT inhaler Inhale 1-2 puffs into the lungs every 6 (six) hours as needed for wheezing or shortness of breath. 1 Inhaler 0  . amLODipine (NORVASC) 10 MG tablet Take 10 mg by mouth daily.     Marland Kitchen aspirin 81 MG chewable tablet Chew 81 mg by mouth daily.    . BD PEN NEEDLE NANO U/F 32G X 4 MM MISC AS DIRECTED THREE TIMES A DAY SQ 30 DAYS  12  . DULoxetine (CYMBALTA) 60 MG capsule Take 1 capsule (60 mg total) by mouth 2 (two) times daily. 60 capsule 1  . ferrous sulfate 325 (65 FE) MG EC tablet Take 325 mg by mouth daily with breakfast.    . fluticasone furoate-vilanterol (BREO ELLIPTA) 100-25 MCG/INH AEPB Inhale 1 puff into the lungs daily.    . insulin aspart (NOVOLOG) 100 UNIT/ML injection Inject 0-9 Units into the skin 3 (three) times daily with meals. CBG < 70: implement hypoglycemia protocol CBG 70 - 120: 0 units CBG 121 - 150: 1 unit CBG 151 - 200: 2 units CBG 201 - 250: 3 units CBG 251 - 300: 5 units CBG 301 - 350: 7 units CBG 351 - 400: 9 units CBG > 400: call MD.    . leflunomide (ARAVA) 20 MG tablet Take 20 mg by mouth daily.  3  . losartan (COZAAR) 100 MG tablet Take 1 tablet (100 mg total) by mouth daily.    . metFORMIN (GLUCOPHAGE) 1000 MG tablet Take 1,000 mg by mouth 2 (two) times daily with a meal.    . mirtazapine (REMERON) 15 MG tablet Take 1 tablet (15 mg total) by mouth at bedtime. 30 tablet 1  . mometasone-formoterol (DULERA) 100-5 MCG/ACT AERO Inhale 2 puffs into the lungs 2 (two) times daily. 1 Inhaler 5  . ONETOUCH VERIO  test strip USE TO TEST BLOOD GLUCOSE 3 TIMES A DAY THREE TIMES A DAY & AS DIRECTED DX E11.9 90 DAYS  3  . Oxycodone HCl 10 MG TABS Take 0.5 tablets (5 mg total) by mouth every 6 (six) hours as needed. For knee pain 6 tablet 0  . pantoprazole (PROTONIX) 40 MG tablet Take 40 mg by mouth daily.   3  . polyethylene glycol (MIRALAX / GLYCOLAX) packet Take 17 g by mouth daily. (Patient taking differently: Take 17 g by mouth daily as needed for mild  constipation. ) 30 each 2   Current Facility-Administered Medications  Medication Dose Route Frequency Provider Last Rate Last Dose  . nystatin cream (MYCOSTATIN)   Topical BID Huel Cote, NP        PHYSICAL EXAMINATION: ECOG PERFORMANCE STATUS: 1 - Symptomatic but completely ambulatory  Vitals:   11/22/17 1546  BP: (!) 162/80  Pulse: 77  Resp: 16  Temp: 97.9 F (36.6 C)  SpO2: 95%   Filed Weights   11/22/17 1546  Weight: 151 lb 14.4 oz (68.9 kg)    GENERAL:alert, no distress and comfortable  SKIN: skin color, texture, turgor are normal, no rashes or significant lesions EYES: normal, Conjunctiva are pink and non-injected, sclera clear OROPHARYNX:no exudate, no erythema and lips, buccal mucosa, and tongue normal  NECK: supple, thyroid normal size, non-tender, without nodularity LYMPH:  no palpable lymphadenopathy in the cervical, axillary or inguinal LUNGS: clear to auscultation and percussion with normal breathing effort HEART: regular rate & rhythm and no murmurs and no lower extremity edema ABDOMEN:abdomen soft, non-tender and normal bowel sounds MUSCULOSKELETAL:no cyanosis of digits and no clubbing  NEURO: alert & oriented x 3 with fluent speech, no focal motor/sensory deficits EXTREMITIES: No lower extremity edema    LABORATORY DATA:  I have reviewed the data as listed CMP Latest Ref Rng & Units 11/03/2017 12/27/2016 12/26/2016  Glucose 70 - 99 mg/dL 97 150(H) 237(H)  BUN 8 - 23 mg/dL 15 7 8   Creatinine 0.44 - 1.00 mg/dL 0.66 0.72 0.82  Sodium 135 - 145 mmol/L 141 138 134(L)  Potassium 3.5 - 5.1 mmol/L 3.3(L) 3.2(L) 3.9  Chloride 98 - 111 mmol/L 104 104 102  CO2 22 - 32 mmol/L 24 25 21(L)  Calcium 8.9 - 10.3 mg/dL 9.0 8.2(L) 8.3(L)  Total Protein 6.5 - 8.1 g/dL 7.1 - 6.2(L)  Total Bilirubin 0.3 - 1.2 mg/dL 0.4 - 0.8  Alkaline Phos 38 - 126 U/L 77 - 84  AST 15 - 41 U/L 17 - 22  ALT 0 - 44 U/L 6 - 21    Lab Results  Component Value Date   WBC 9.9  11/22/2017   HGB 11.0 (L) 11/22/2017   HCT 35.7 (L) 11/22/2017   MCV 86.7 11/22/2017   PLT 399 11/22/2017   NEUTROABS 7.1 11/22/2017    ASSESSMENT & PLAN:  Thrombocytosis (H[facility]) Severe thrombocytosis: Reactionary to underlying rheumatoid arthritis.  Currently on anti-rheumatoid arthritis medications.  Blood work review: Platelet count 399 Since the thrombocytosis has resolved, we can see her on an as-needed basis. Hemoglobin has also improved up to 11 g. No orders of the defined types were placed in this encounter.  The patient has a good understanding of the overall plan. she agrees with it. she will call with any problems that may develop before the next visit here.   Harriette Ohara, MD 11/22/17

## 2017-11-23 ENCOUNTER — Ambulatory Visit (HOSPITAL_COMMUNITY): Payer: No Typology Code available for payment source | Admitting: Licensed Clinical Social Worker

## 2017-11-29 ENCOUNTER — Ambulatory Visit (HOSPITAL_COMMUNITY): Payer: No Typology Code available for payment source | Admitting: Psychiatry

## 2017-12-02 ENCOUNTER — Other Ambulatory Visit: Payer: Self-pay

## 2017-12-02 ENCOUNTER — Encounter (HOSPITAL_COMMUNITY): Payer: Self-pay | Admitting: Psychiatry

## 2017-12-02 ENCOUNTER — Ambulatory Visit (INDEPENDENT_AMBULATORY_CARE_PROVIDER_SITE_OTHER): Payer: Medicare Other | Admitting: Psychiatry

## 2017-12-02 VITALS — BP 144/86 | HR 81 | Ht 65.0 in | Wt 151.0 lb

## 2017-12-02 DIAGNOSIS — F411 Generalized anxiety disorder: Secondary | ICD-10-CM

## 2017-12-02 DIAGNOSIS — F322 Major depressive disorder, single episode, severe without psychotic features: Secondary | ICD-10-CM | POA: Diagnosis not present

## 2017-12-02 DIAGNOSIS — F5102 Adjustment insomnia: Secondary | ICD-10-CM

## 2017-12-02 MED ORDER — MIRTAZAPINE 15 MG PO TABS: 22.5000 mg | ORAL_TABLET | Freq: Every day | ORAL | 3 refills | Status: DC
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
## 2017-12-07 ENCOUNTER — Encounter (HOSPITAL_COMMUNITY): Payer: Self-pay | Admitting: Licensed Clinical Social Worker

## 2017-12-07 ENCOUNTER — Ambulatory Visit (INDEPENDENT_AMBULATORY_CARE_PROVIDER_SITE_OTHER): Payer: Medicare Other | Admitting: Licensed Clinical Social Worker

## 2017-12-07 DIAGNOSIS — F322 Major depressive disorder, single episode, severe without psychotic features: Secondary | ICD-10-CM | POA: Diagnosis not present

## 2017-12-07 NOTE — Progress Notes (Signed)
   THERAPIST PROGRESS NOTE  Session Time: 2:30pm-3:20pm  Participation Level: Active  Behavioral Response: NeatAlertAnxious   Type of Therapy: Family Therapy  Treatment Goals addressed: "help with my depression, get me more motivated to get out of the house"  Interventions: Motivational Interviewing, CBT, psycho-education, Mindfulness and Grounding techniques  Summary: Nakia Remmers is a 77 y.o. female who presents with Major Depressive Disorder, severe, without psychotic features    Suicidal/Homicidal: No without intent/plan  Therapist Response: Adriyana and her youngest son met with clinician for a family session. Palin discussed her psychiatric symptoms, her current life events and her homework. Clinician explored interactions in the home and noted that there continues to be some communication issues between Aerial and her son. Clinician utilized family therapy methods to elicit information about the history between Codi and her son. 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 Deneane reports she asked her son to take over the business when her husband died in order to provide him stability and income. Son reports he took over the business to help his mom because she "doesn't understand business". Clinician explored the differences of opinion and collaborated with the family to emphasize that both were caring for each other.  Demica reports she has not been going out, but she was able to get Meals on Wheels in place, as her son is not home in the evenings. She reports wanting to go to the [facility] once she can get her health things under control.   Plan: Return again in 2-3 weeks.  Diagnosis:     Axis I: Major Depressive Disorder, severe, without psychotic features    Mindi Curling, LCSW 12/07/2017

## 2017-12-08 DIAGNOSIS — J449 Chronic obstructive pulmonary disease, unspecified: Secondary | ICD-10-CM | POA: Diagnosis not present

## 2017-12-08 DIAGNOSIS — Z7984 Long term (current) use of oral hypoglycemic drugs: Secondary | ICD-10-CM | POA: Diagnosis not present

## 2017-12-08 DIAGNOSIS — E1140 Type 2 diabetes mellitus with diabetic neuropathy, unspecified: Secondary | ICD-10-CM | POA: Diagnosis not present

## 2017-12-08 DIAGNOSIS — M179 Osteoarthritis of knee, unspecified: Secondary | ICD-10-CM | POA: Diagnosis not present

## 2017-12-08 DIAGNOSIS — E1165 Type 2 diabetes mellitus with hyperglycemia: Secondary | ICD-10-CM | POA: Diagnosis not present

## 2017-12-08 DIAGNOSIS — I10 Essential (primary) hypertension: Secondary | ICD-10-CM | POA: Diagnosis not present

## 2017-12-08 DIAGNOSIS — M069 Rheumatoid arthritis, unspecified: Secondary | ICD-10-CM | POA: Diagnosis not present

## 2017-12-08 DIAGNOSIS — F324 Major depressive disorder, single episode, in partial remission: Secondary | ICD-10-CM | POA: Diagnosis not present

## 2017-12-08 DIAGNOSIS — J45909 Unspecified asthma, uncomplicated: Secondary | ICD-10-CM | POA: Diagnosis not present

## 2017-12-09 DIAGNOSIS — Z6823 Body mass index (BMI) 23.0-23.9, adult: Secondary | ICD-10-CM | POA: Diagnosis not present

## 2017-12-09 DIAGNOSIS — M0579 Rheumatoid arthritis with rheumatoid factor of multiple sites without organ or systems involvement: Secondary | ICD-10-CM | POA: Diagnosis not present

## 2017-12-09 DIAGNOSIS — M2550 Pain in unspecified joint: Secondary | ICD-10-CM | POA: Diagnosis not present

## 2017-12-09 DIAGNOSIS — Z79899 Other long term (current) drug therapy: Secondary | ICD-10-CM | POA: Diagnosis not present

## 2017-12-09 DIAGNOSIS — M150 Primary generalized (osteo)arthritis: Secondary | ICD-10-CM | POA: Diagnosis not present

## 2017-12-21 ENCOUNTER — Ambulatory Visit (INDEPENDENT_AMBULATORY_CARE_PROVIDER_SITE_OTHER): Payer: Medicare Other | Admitting: Licensed Clinical Social Worker

## 2017-12-21 ENCOUNTER — Encounter (HOSPITAL_COMMUNITY): Payer: Self-pay | Admitting: Licensed Clinical Social Worker

## 2017-12-21 DIAGNOSIS — F322 Major depressive disorder, single episode, severe without psychotic features: Secondary | ICD-10-CM | POA: Diagnosis not present

## 2017-12-21 NOTE — Progress Notes (Signed)
   THERAPIST PROGRESS NOTE  Session Time: 2:30pm-3:15pm  Participation Level: Active  Behavioral Response: Well GroomedAlertEuthymic  Type of Therapy: Individual Therapy  Treatment Goals addressed: "help with my depression, get me more motivated to get out of the house"  Interventions: Motivational Interviewing, CBT, psycho-education, Mindfulness and Grounding techniques  Summary: Jamiyah Dingley is a 77 y.o. female who presents with Major Depressive Disorder, severe, without psychotic features    Suicidal/Homicidal: No without intent/plan  Therapist Response: Floye met with clinician for an individual session. Torianna discussed her psychiatric symptoms, her current life events and her homework. Clinician explored interactions with son following last session. Clinician provided some feedback about the differing opinions between Natina and her son Paramedic. Taleigha reports they both want the best for each other, but they have differing opinions of what "the best" entails. Irys discussed her relationships with all of her children and was able to process some things that she would change or would have wanted to go better over the years. Ashlye also noted that she feels she has been a good mother and she knows her children and grandchildren are good people.   Plan: Return again in 1-2 weeks.  Diagnosis:     Axis I: Major Depressive Disorder, severe, without psychotic features     Mindi Curling, LCSW 12/21/2017

## 2018-01-05 DIAGNOSIS — M0579 Rheumatoid arthritis with rheumatoid factor of multiple sites without organ or systems involvement: Secondary | ICD-10-CM | POA: Diagnosis not present

## 2018-01-21 ENCOUNTER — Encounter: Payer: Self-pay | Admitting: Podiatry

## 2018-01-21 ENCOUNTER — Ambulatory Visit (INDEPENDENT_AMBULATORY_CARE_PROVIDER_SITE_OTHER): Payer: Medicare Other | Admitting: Podiatry

## 2018-01-21 DIAGNOSIS — M79675 Pain in left toe(s): Secondary | ICD-10-CM | POA: Diagnosis not present

## 2018-01-21 DIAGNOSIS — M79674 Pain in right toe(s): Secondary | ICD-10-CM

## 2018-01-21 DIAGNOSIS — B351 Tinea unguium: Secondary | ICD-10-CM

## 2018-01-21 NOTE — Patient Instructions (Signed)
Diabetes Mellitus and Foot Care Foot care is an important part of your health, especially when you have diabetes. Diabetes may cause you to have problems because of poor blood flow (circulation) to your feet and legs, which can cause your skin to:  Become thinner and drier.  Break more easily.  Heal more slowly.  Peel and crack. You may also have nerve damage (neuropathy) in your legs and feet, causing decreased feeling in them. This means that you may not notice minor injuries to your feet that could lead to more serious problems. Noticing and addressing any potential problems early is the best way to prevent future foot problems. How to care for your feet Foot hygiene  Wash your feet daily with warm water and mild soap. Do not use hot water. Then, pat your feet and the areas between your toes until they are completely dry. Do not soak your feet as this can dry your skin.  Trim your toenails straight across. Do not dig under them or around the cuticle. File the edges of your nails with an emery board or nail file.  Apply a moisturizing lotion or petroleum jelly to the skin on your feet and to dry, brittle toenails. Use lotion that does not contain alcohol and is unscented. Do not apply lotion between your toes. Shoes and socks  Wear clean socks or stockings every day. Make sure they are not too tight. Do not wear knee-high stockings since they may decrease blood flow to your legs.  Wear shoes that fit properly and have enough cushioning. Always look in your shoes before you put them on to be sure there are no objects inside.  To break in new shoes, wear them for just a few hours a day. This prevents injuries on your feet. Wounds, scrapes, corns, and calluses  Check your feet daily for blisters, cuts, bruises, sores, and redness. If you cannot see the bottom of your feet, use a mirror or ask someone for help.  Do not cut corns or calluses or try to remove them with medicine.  If you  find a minor scrape, cut, or break in the skin on your feet, keep it and the skin around it clean and dry. You may clean these areas with mild soap and water. Do not clean the area with peroxide, alcohol, or iodine.  If you have a wound, scrape, corn, or callus on your foot, look at it several times a day to make sure it is healing and not infected. Check for: ? Redness, swelling, or pain. ? Fluid or blood. ? Warmth. ? Pus or a bad smell. General instructions  Do not cross your legs. This may decrease blood flow to your feet.  Do not use heating pads or hot water bottles on your feet. They may burn your skin. If you have lost feeling in your feet or legs, you may not know this is happening until it is too late.  Protect your feet from hot and cold by wearing shoes, such as at the beach or on hot pavement.  Schedule a complete foot exam at least once a year (annually) or more often if you have foot problems. If you have foot problems, report any cuts, sores, or bruises to your health care provider immediately. Contact a health care provider if:  You have a medical condition that increases your risk of infection and you have any cuts, sores, or bruises on your feet.  You have an injury that is not   healing.  You have redness on your legs or feet.  You feel burning or tingling in your legs or feet.  You have pain or cramps in your legs and feet.  Your legs or feet are numb.  Your feet always feel cold.  You have pain around a toenail. Get help right away if:  You have a wound, scrape, corn, or callus on your foot and: ? You have pain, swelling, or redness that gets worse. ? You have fluid or blood coming from the wound, scrape, corn, or callus. ? Your wound, scrape, corn, or callus feels warm to the touch. ? You have pus or a bad smell coming from the wound, scrape, corn, or callus. ? You have a fever. ? You have a red line going up your leg. Summary  Check your feet every day  for cuts, sores, red spots, swelling, and blisters.  Moisturize feet and legs daily.  Wear shoes that fit properly and have enough cushioning.  If you have foot problems, report any cuts, sores, or bruises to your health care provider immediately.  Schedule a complete foot exam at least once a year (annually) or more often if you have foot problems. This information is not intended to replace advice given to you by your health care provider. Make sure you discuss any questions you have with your health care provider. Document Released: 01/03/2000 Document Revised: 02/17/2017 Document Reviewed: 02/07/2016 Elsevier Interactive Patient Education  2019 [employer]  Onychomycosis/Fungal Toenails  WHAT IS IT? An infection that lies within the keratin of your nail plate that is caused by a fungus.  WHY ME? Fungal infections affect all ages, sexes, races, and creeds.  There may be many factors that predispose you to a fungal infection such as age, coexisting medical conditions such as diabetes, or an autoimmune disease; stress, medications, fatigue, genetics, etc.  Bottom line: fungus thrives in a warm, moist environment and your shoes offer such a location.  IS IT CONTAGIOUS? Theoretically, yes.  You do not want to share shoes, nail clippers or files with someone who has fungal toenails.  Walking around barefoot in the same room or sleeping in the same bed is unlikely to transfer the organism.  It is important to realize, however, that fungus can spread easily from one nail to the next on the same foot.  HOW DO WE TREAT THIS?  There are several ways to treat this condition.  Treatment may depend on many factors such as age, medications, pregnancy, liver and kidney conditions, etc.  It is best to ask your doctor which options are available to you.  1. No treatment.   Unlike many other medical concerns, you can live with this condition.  However for many people this can be a painful condition and  may lead to ingrown toenails or a bacterial infection.  It is recommended that you keep the nails cut short to help reduce the amount of fungal nail. 2. Topical treatment.  These range from herbal remedies to prescription strength nail lacquers.  About 40-50% effective, topicals require twice daily application for approximately 9 to 12 months or until an entirely new nail has grown out.  The most effective topicals are medical grade medications available through physicians offices. 3. Oral antifungal medications.  With an 80-90% cure rate, the most common oral medication requires 3 to 4 months of therapy and stays in your system for a year as the new nail grows out.  Oral antifungal medications do require   blood work to make sure it is a safe drug for you.  A liver function panel will be performed prior to starting the medication and after the first month of treatment.  It is important to have the blood work performed to avoid any harmful side effects.  In general, this medication safe but blood work is required. 4. Laser Therapy.  This treatment is performed by applying a specialized laser to the affected nail plate.  This therapy is noninvasive, fast, and non-painful.  It is not covered by insurance and is therefore, out of pocket.  The results have been very good with a 80-95% cure rate.  The [facility] is the only practice in the area to offer this therapy. 5. Permanent Nail Avulsion.  Removing the entire nail so that a new nail will not grow back. 

## 2018-01-25 ENCOUNTER — Ambulatory Visit (HOSPITAL_COMMUNITY): Payer: No Typology Code available for payment source | Admitting: Licensed Clinical Social Worker

## 2018-02-03 ENCOUNTER — Ambulatory Visit (INDEPENDENT_AMBULATORY_CARE_PROVIDER_SITE_OTHER): Payer: Medicare Other | Admitting: Psychiatry

## 2018-02-03 ENCOUNTER — Encounter (HOSPITAL_COMMUNITY): Payer: Self-pay | Admitting: Psychiatry

## 2018-02-03 VITALS — BP 135/67 | HR 64 | Ht 65.0 in | Wt 153.8 lb

## 2018-02-03 DIAGNOSIS — F331 Major depressive disorder, recurrent, moderate: Secondary | ICD-10-CM

## 2018-02-03 DIAGNOSIS — F411 Generalized anxiety disorder: Secondary | ICD-10-CM | POA: Diagnosis not present

## 2018-02-03 MED ORDER — MIRTAZAPINE 30 MG PO TABS: 30.0000 mg | ORAL_TABLET | Freq: Every day | ORAL | 1 refills | Status: DC

## 2018-02-03 MED ORDER — DULOXETINE HCL 60 MG PO CPEP: 60.0000 mg | ORAL_CAPSULE | Freq: Two times a day (BID) | ORAL | 1 refills | Status: DC
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
## 2018-02-14 ENCOUNTER — Encounter: Payer: Self-pay | Admitting: Podiatry

## 2018-02-14 NOTE — Progress Notes (Signed)
Subjective: Leslie Gamble presents today with painful, thick toenails 1-5 b/l that she cannot cut and which interfere with daily activities.  Pain is aggravated when wearing enclosed shoe gear and relieved with perioidic debridement.   She states she is starting Pednisone for RA.  Wenda Low, MD is her PCP   Current Outpatient Medications:  .  acetaminophen (TYLENOL) 500 MG tablet, Take 1,000 mg by mouth every 6 (six) hours as needed for headache., Disp: , Rfl:  .  albuterol (PROVENTIL HFA;VENTOLIN HFA) 108 (90 Base) MCG/ACT inhaler, Inhale 1-2 puffs into the lungs every 6 (six) hours as needed for wheezing or shortness of breath., Disp: 1 Inhaler, Rfl: 0 .  amLODipine (NORVASC) 10 MG tablet, Take 10 mg by mouth daily. , Disp: , Rfl:  .  aspirin 81 MG chewable tablet, Chew 81 mg by mouth daily., Disp: , Rfl:  .  BD PEN NEEDLE NANO U/F 32G X 4 MM MISC, AS DIRECTED THREE TIMES A DAY SQ 30 DAYS, Disp: , Rfl: 12 .  ferrous sulfate 325 (65 FE) MG EC tablet, Take 325 mg by mouth daily with breakfast., Disp: , Rfl:  .  fluticasone furoate-vilanterol (BREO ELLIPTA) 100-25 MCG/INH AEPB, Inhale 1 puff into the lungs daily., Disp: , Rfl:  .  insulin aspart (NOVOLOG) 100 UNIT/ML injection, Inject 0-9 Units into the skin 3 (three) times daily with meals. CBG < 70: implement hypoglycemia protocol CBG 70 - 120: 0 units CBG 121 - 150: 1 unit CBG 151 - 200: 2 units CBG 201 - 250: 3 units CBG 251 - 300: 5 units CBG 301 - 350: 7 units CBG 351 - 400: 9 units CBG > 400: call MD., Disp: , Rfl:  .  leflunomide (ARAVA) 20 MG tablet, Take 20 mg by mouth daily., Disp: , Rfl: 3 .  metFORMIN (GLUCOPHAGE) 1000 MG tablet, Take 1,000 mg by mouth 2 (two) times daily with a meal., Disp: , Rfl:  .  mometasone-formoterol (DULERA) 100-5 MCG/ACT AERO, Inhale 2 puffs into the lungs 2 (two) times daily., Disp: 1 Inhaler, Rfl: 5 .  ONETOUCH VERIO test strip, USE TO TEST BLOOD GLUCOSE 3 TIMES A DAY THREE TIMES A DAY & AS DIRECTED  DX E11.9 90 DAYS, Disp: , Rfl: 3 .  Oxycodone HCl 10 MG TABS, Take 0.5 tablets (5 mg total) by mouth every 6 (six) hours as needed. For knee pain, Disp: 6 tablet, Rfl: 0 .  pantoprazole (PROTONIX) 40 MG tablet, Take 40 mg by mouth daily. , Disp: , Rfl: 3 .  polyethylene glycol (MIRALAX / GLYCOLAX) packet, Take 17 g by mouth daily. (Patient taking differently: Take 17 g by mouth daily as needed for mild constipation. ), Disp: 30 each, Rfl: 2 .  pravastatin (PRAVACHOL) 10 MG tablet, Take 10 mg by mouth daily., Disp: , Rfl: 5 .  predniSONE (DELTASONE) 5 MG tablet, Take 5 mg by mouth daily as needed., Disp: , Rfl:  .  TRESIBA FLEXTOUCH 100 UNIT/ML SOPN FlexTouch Pen, , Disp: , Rfl:  .  DULoxetine (CYMBALTA) 60 MG capsule, Take 1 capsule (60 mg total) by mouth 2 (two) times daily., Disp: 60 capsule, Rfl: 1 .  losartan (COZAAR) 100 MG tablet, Take 1 tablet (100 mg total) by mouth daily., Disp: , Rfl:  .  mirtazapine (REMERON) 30 MG tablet, Take 1 tablet (30 mg total) by mouth at bedtime., Disp: 30 tablet, Rfl: 1  Current Facility-Administered Medications:  .  nystatin cream (MYCOSTATIN), , Topical, BID, Young,  Candiss Norse, NP  Allergies  Allergen Reactions  . Avelox [Moxifloxacin Hcl In Nacl] Itching and Swelling  . Cephalexin     Nose swelling  . Ciprofloxacin Swelling  . Clarithromycin Swelling  . Clindamycin Hcl Swelling  . Codeine     Lip swelling  . Diflucan [Fluconazole] Diarrhea  . Lidocaine Swelling  . Metronidazole Swelling  . Nitrofurantoin Monohyd Macro Swelling  . Other     Magic mouthwash   . Penicillins Swelling  . Shrimp [Shellfish Allergy] Swelling    In nose and lips  . Sitagliptin Swelling  . Sulfa Antibiotics Swelling  . Tetracyclines & Related Swelling  . Tramadol Hcl     REACTION: facial swelling, dyspnea    Objective:  Vascular Examination: Capillary refill time < 3 seconds x 10 digits  Dorsalis pedis and Posterior tibial pulses palpable b/l  Digital hair  absent x 10 digits  Skin temperature gradient WNL b/l  Dermatological Examination: Skin with normal turgor, texture and tone b/l  Toenails 1-5 b/l discolored, thick, dystrophic with subungual debris and pain with palpation to nailbeds due to thickness of nails.  Musculoskeletal: Muscle strength 5/5 to all LE muscle groups  No pain, crepitus or joint limitation noted with ROM.   Neurological: Sensation diminished with 10 gram monofilament b/l  Assessment: Painful onychomycosis toenails 1-5 b/l   Plan: 1. Toenails 1-5 b/l were debrided in length and girth without iatrogenic bleeding. 2. Patient to continue soft, supportive shoe gear 3. Patient to report any pedal injuries to medical professional immediately. 4. Follow up 3 months. Patient/POA to call should there be a concern in the interim.

## 2018-03-02 DIAGNOSIS — M0579 Rheumatoid arthritis with rheumatoid factor of multiple sites without organ or systems involvement: Secondary | ICD-10-CM | POA: Diagnosis not present

## 2018-03-02 DIAGNOSIS — T887XXD Unspecified adverse effect of drug or medicament, subsequent encounter: Secondary | ICD-10-CM | POA: Diagnosis not present

## 2018-03-02 DIAGNOSIS — T50905D Adverse effect of unspecified drugs, medicaments and biological substances, subsequent encounter: Secondary | ICD-10-CM | POA: Diagnosis not present

## 2018-03-02 DIAGNOSIS — Z79899 Other long term (current) drug therapy: Secondary | ICD-10-CM | POA: Diagnosis not present

## 2018-03-08 ENCOUNTER — Encounter (HOSPITAL_COMMUNITY): Payer: Self-pay | Admitting: Licensed Clinical Social Worker

## 2018-03-08 ENCOUNTER — Ambulatory Visit (INDEPENDENT_AMBULATORY_CARE_PROVIDER_SITE_OTHER): Payer: Medicare Other | Admitting: Licensed Clinical Social Worker

## 2018-03-08 DIAGNOSIS — F330 Major depressive disorder, recurrent, mild: Secondary | ICD-10-CM | POA: Diagnosis not present
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
## 2018-03-09 DIAGNOSIS — I10 Essential (primary) hypertension: Secondary | ICD-10-CM | POA: Diagnosis not present

## 2018-03-09 DIAGNOSIS — E1165 Type 2 diabetes mellitus with hyperglycemia: Secondary | ICD-10-CM | POA: Diagnosis not present

## 2018-03-09 DIAGNOSIS — I700 Atherosclerosis of aorta: Secondary | ICD-10-CM | POA: Diagnosis not present

## 2018-03-09 DIAGNOSIS — M069 Rheumatoid arthritis, unspecified: Secondary | ICD-10-CM | POA: Diagnosis not present

## 2018-03-09 DIAGNOSIS — K219 Gastro-esophageal reflux disease without esophagitis: Secondary | ICD-10-CM | POA: Diagnosis not present

## 2018-03-09 DIAGNOSIS — E1140 Type 2 diabetes mellitus with diabetic neuropathy, unspecified: Secondary | ICD-10-CM | POA: Diagnosis not present

## 2018-03-09 DIAGNOSIS — F324 Major depressive disorder, single episode, in partial remission: Secondary | ICD-10-CM | POA: Diagnosis not present

## 2018-03-09 DIAGNOSIS — Z7984 Long term (current) use of oral hypoglycemic drugs: Secondary | ICD-10-CM | POA: Diagnosis not present

## 2018-03-09 DIAGNOSIS — J449 Chronic obstructive pulmonary disease, unspecified: Secondary | ICD-10-CM | POA: Diagnosis not present

## 2018-03-17 DIAGNOSIS — M2550 Pain in unspecified joint: Secondary | ICD-10-CM | POA: Diagnosis not present

## 2018-03-17 DIAGNOSIS — M150 Primary generalized (osteo)arthritis: Secondary | ICD-10-CM | POA: Diagnosis not present

## 2018-03-17 DIAGNOSIS — Z79899 Other long term (current) drug therapy: Secondary | ICD-10-CM | POA: Diagnosis not present

## 2018-03-17 DIAGNOSIS — M0579 Rheumatoid arthritis with rheumatoid factor of multiple sites without organ or systems involvement: Secondary | ICD-10-CM | POA: Diagnosis not present

## 2018-03-17 DIAGNOSIS — Z6824 Body mass index (BMI) 24.0-24.9, adult: Secondary | ICD-10-CM | POA: Diagnosis not present

## 2018-03-29 ENCOUNTER — Encounter (HOSPITAL_COMMUNITY): Payer: Self-pay | Admitting: Psychiatry

## 2018-03-29 ENCOUNTER — Ambulatory Visit (INDEPENDENT_AMBULATORY_CARE_PROVIDER_SITE_OTHER): Payer: Medicare Other | Admitting: Psychiatry

## 2018-03-29 DIAGNOSIS — F411 Generalized anxiety disorder: Secondary | ICD-10-CM

## 2018-03-29 DIAGNOSIS — F331 Major depressive disorder, recurrent, moderate: Secondary | ICD-10-CM

## 2018-03-29 MED ORDER — MIRTAZAPINE 30 MG PO TABS: 30.0000 mg | ORAL_TABLET | Freq: Every day | ORAL | 2 refills | Status: DC

## 2018-03-29 MED ORDER — DULOXETINE HCL 60 MG PO CPEP: 60.0000 mg | ORAL_CAPSULE | Freq: Two times a day (BID) | ORAL | 2 refills | Status: DC
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
## 2018-04-04 ENCOUNTER — Ambulatory Visit (HOSPITAL_COMMUNITY): Payer: No Typology Code available for payment source | Admitting: Licensed Clinical Social Worker

## 2018-04-05 ENCOUNTER — Other Ambulatory Visit: Payer: Self-pay

## 2018-04-05 ENCOUNTER — Encounter: Payer: Self-pay | Admitting: Women's Health

## 2018-04-05 ENCOUNTER — Ambulatory Visit (INDEPENDENT_AMBULATORY_CARE_PROVIDER_SITE_OTHER): Payer: Medicare Other | Admitting: Women's Health

## 2018-04-05 ENCOUNTER — Ambulatory Visit: Payer: Medicare Other | Admitting: Women's Health

## 2018-04-05 VITALS — BP 132/80

## 2018-04-05 DIAGNOSIS — N898 Other specified noninflammatory disorders of vagina: Secondary | ICD-10-CM | POA: Diagnosis not present

## 2018-04-05 DIAGNOSIS — R300 Dysuria: Secondary | ICD-10-CM

## 2018-04-05 LAB — WET PREP FOR TRICH, YEAST, CLUE

## 2018-04-05 NOTE — Progress Notes (Signed)
78 year old WBF G5, P3 presents with complaint of vaginal irritation with some itching.  Was treated with Diflucan 150 mg 1 dose by primary care 4 days ago minimal relief.  Denies vaginal discharge, pain or burning with urination or pain at end of stream, abdominal pain, back pain or fever.  Denies leakage of urinary incontinence.  Numerous medical problems that include diabetes on insulin, hypertension, COPD, anxiety/depression.  States continues to struggle with depression.  Does live independently but does not drive anymore.  Walks with a cane.  Postmenopausal on no HRT with no bleeding.  Not sexually active in years.  Exam: Appears comfortable.  External genitalia atrophic, no visible lesions, blisters, discharge, wet prep done with a Q-tip negative. UA: +3 protein, trace ketones, +1 leukocytes, 10-20 WBCs, no RBCs, 6-10 squamous epithelials, few bacteria  Vaginal irritation  Plan: Encouraged loose clothing open to air as able, A and D ointment externally twice daily, after bathing, avoid underwear at bedtime.  Urine culture pending.

## 2018-04-05 NOTE — Patient Instructions (Signed)
Over the counter A&D ointment to external genitalia for several days and then daily after washing.     Vaginitis Vaginitis is a condition in which the vaginal tissue swells and becomes red (inflamed). This condition is most often caused by a change in the normal balance of bacteria and yeast that live in the vagina. This change causes an overgrowth of certain bacteria or yeast, which causes the inflammation. There are different types of vaginitis, but the most common types are:  Bacterial vaginosis.  Yeast infection (candidiasis).  Trichomoniasis vaginitis. This is a sexually transmitted disease (STD).  Viral vaginitis.  Atrophic vaginitis.  Allergic vaginitis. What are the causes? The cause of this condition depends on the type of vaginitis. It can be caused by:  Bacteria (bacterial vaginosis).  Yeast, which is a fungus (yeast infection).  A parasite (trichomoniasis vaginitis).  A virus (viral vaginitis).  Low hormone levels (atrophic vaginitis). Low hormone levels can occur during pregnancy, breastfeeding, or after menopause.  Irritants, such as bubble baths, scented tampons, and feminine sprays (allergic vaginitis). Other factors can change the normal balance of the yeast and bacteria that live in the vagina. These include:  Antibiotic medicines.  Poor hygiene.  Diaphragms, vaginal sponges, spermicides, birth control pills, and intrauterine devices (IUD).  Sex.  Infection.  Uncontrolled diabetes.  A weakened defense (immune) system. What increases the risk? This condition is more likely to develop in women who:  Smoke.  Use vaginal douches, scented tampons, or scented sanitary pads.  Wear tight-fitting pants.  Wear thong underwear.  Use oral birth control pills or an IUD.  Have sex without a condom.  Have multiple sex partners.  Have an STD.  Frequently use the spermicide nonoxynol-9.  Eat lots of foods high in sugar.  Have uncontrolled  diabetes.  Have low estrogen levels.  Have a weakened immune system from an immune disorder or medical treatment.  Are pregnant or breastfeeding. What are the signs or symptoms? Symptoms vary depending on the cause of the vaginitis. Common symptoms include:  Abnormal vaginal discharge. ? The discharge is white, gray, or yellow with bacterial vaginosis. ? The discharge is thick, white, and cheesy with a yeast infection. ? The discharge is frothy and yellow or greenish with trichomoniasis.  A bad vaginal smell. The smell is fishy with bacterial vaginosis.  Vaginal itching, pain, or swelling.  Sex that is painful.  Pain or burning when urinating. Sometimes there are no symptoms. How is this diagnosed? This condition is diagnosed based on your symptoms and medical history. A physical exam, including a pelvic exam, will also be done. You may also have other tests, including:  Tests to determine the pH level (acidity or alkalinity) of your vagina.  A whiff test, to assess the odor that results when a sample of your vaginal discharge is mixed with a potassium hydroxide solution.  Tests of vaginal fluid. A sample will be examined under a microscope. How is this treated? Treatment varies depending on the type of vaginitis you have. Your treatment may include:  Antibiotic creams or pills to treat bacterial vaginosis and trichomoniasis.  Antifungal medicines, such as vaginal creams or suppositories, to treat a yeast infection.  Medicine to ease discomfort if you have viral vaginitis. Your sexual partner should also be treated.  Estrogen delivered in a cream, pill, suppository, or vaginal ring to treat atrophic vaginitis. If vaginal dryness occurs, lubricants and moisturizing creams may help. You may need to avoid scented soaps, sprays, or douches.  Stopping use of a product that is causing allergic vaginitis. Then using a vaginal cream to treat the symptoms. Follow these instructions  at home: Lifestyle  Keep your genital area clean and dry. Avoid soap, and only rinse the area with water.  Do not douche or use tampons until your health care provider says it is okay to do so. Use sanitary pads, if needed.  Do not have sex until your health care provider approves. When you can return to sex, practice safe sex and use condoms.  Wipe from front to back. This avoids the spread of bacteria from the rectum to the vagina. General instructions  Take over-the-counter and prescription medicines only as told by your health care provider.  If you were prescribed an antibiotic medicine, take or use it as told by your health care provider. Do not stop taking or using the antibiotic even if you start to feel better.  Keep all follow-up visits as told by your health care provider. This is important. How is this prevented?  Use mild, non-scented products. Do not use things that can irritate the vagina, such as fabric softeners. Avoid the following products if they are scented: ? Feminine sprays. ? Detergents. ? Tampons. ? Feminine hygiene products. ? Soaps or bubble baths.  Let air reach your genital area. ? Wear cotton underwear to reduce moisture buildup. ? Avoid wearing underwear while you sleep. ? Avoid wearing tight pants and underwear or nylons without a cotton panel. ? Avoid wearing thong underwear.  Take off any wet clothing, such as bathing suits, as soon as possible.  Practice safe sex and use condoms. Contact a health care provider if:  You have abdominal pain.  You have a fever.  You have symptoms that last for more than 2-3 days. Get help right away if:  You have a fever and your symptoms suddenly get worse. Summary  Vaginitis is a condition in which the vaginal tissue becomes inflamed.This condition is most often caused by a change in the normal balance of bacteria and yeast that live in the vagina.  Treatment varies depending on the type of vaginitis  you have.  Do not douche, use tampons , or have sex until your health care provider approves. When you can return to sex, practice safe sex and use condoms. This information is not intended to replace advice given to you by your health care provider. Make sure you discuss any questions you have with your health care provider. Document Released: 11/02/2006 Document Revised: 02/11/2016 Document Reviewed: 02/11/2016 Elsevier Interactive Patient Education  2019 [employer].

## 2018-04-07 LAB — URINALYSIS, COMPLETE W/RFL CULTURE
BILIRUBIN URINE: NEGATIVE
Glucose, UA: NEGATIVE
HYALINE CAST: NONE SEEN /LPF
Hgb urine dipstick: NEGATIVE
Nitrites, Initial: NEGATIVE
RBC / HPF: NONE SEEN /HPF (ref 0–2)
SPECIFIC GRAVITY, URINE: 1.021 (ref 1.001–1.03)
pH: 7 (ref 5.0–8.0)

## 2018-04-07 LAB — URINE CULTURE
MICRO NUMBER: 332425
SPECIMEN QUALITY: ADEQUATE

## 2018-04-07 LAB — CULTURE INDICATED

## 2018-04-11 ENCOUNTER — Ambulatory Visit (HOSPITAL_COMMUNITY): Payer: No Typology Code available for payment source | Admitting: Psychiatry

## 2018-04-12 ENCOUNTER — Telehealth (HOSPITAL_COMMUNITY): Payer: Self-pay

## 2018-04-12 NOTE — Telephone Encounter (Signed)
Patient is calling, she states that her anxiety and depression has increased and she would like a medication to help. Patient is taking Cymbalta during the day and Remeron at night. Please review and advise, thank you

## 2018-04-12 NOTE — Telephone Encounter (Signed)
She is taking moderate dose of Cymbalta and Remeron.  We have recommended to see a therapist to help her anxiety.  She also need to do neuroimaging studies to rule out cognitive impairment.  Please contact Dr. Sherilyn Cooter office for these tests.

## 2018-04-18 ENCOUNTER — Telehealth: Payer: Self-pay | Admitting: *Deleted

## 2018-04-18 NOTE — Telephone Encounter (Signed)
Please call, have her try some nystatin cream, she can apply externally, may help.  she can get at the pharmacy.  They may be able to deliver, she does not drive.

## 2018-04-18 NOTE — Telephone Encounter (Signed)
Message left to call

## 2018-04-18 NOTE — Telephone Encounter (Signed)
Patient called to follow up from [place] on 04/05/18 used A&D ointment and still c/o itching internal/external, no discharge. Please advise

## 2018-04-18 NOTE — Telephone Encounter (Signed)
Patient said she can't use nystatin it made her itch very bad.

## 2018-04-19 ENCOUNTER — Other Ambulatory Visit: Payer: Self-pay | Admitting: Women's Health

## 2018-04-19 MED ORDER — TERCONAZOLE 0.4 % VA CREA: 1.0000 | TOPICAL_CREAM | Freq: Every day | VAGINAL | 0 refills | Status: DC

## 2018-04-19 NOTE — Telephone Encounter (Signed)
Izora Gala patient daughter called back and said call patient on home # [phone]

## 2018-04-19 NOTE — Progress Notes (Signed)
tera

## 2018-04-19 NOTE — Telephone Encounter (Signed)
Telephone call, states continues with the external itching with burning sensation at times, minimal help with the A&D ointment states has used a cream in the past with Dr. Cherylann Banas, with review of records has used Terazol in the past will try Terazol 7 externally to see if that helps if no relief instructed to call back.  Diabetic but states her blood sugar numbers have been good.

## 2018-04-25 ENCOUNTER — Telehealth (HOSPITAL_COMMUNITY): Payer: Self-pay

## 2018-04-25 ENCOUNTER — Ambulatory Visit: Payer: Medicare Other | Admitting: Podiatry

## 2018-04-25 NOTE — Telephone Encounter (Signed)
Medication refill request - Fax received requesting a 90 day order of patient's Mirtazapine.

## 2018-04-26 ENCOUNTER — Telehealth (HOSPITAL_COMMUNITY): Payer: Self-pay

## 2018-04-26 DIAGNOSIS — F331 Major depressive disorder, recurrent, moderate: Secondary | ICD-10-CM

## 2018-04-26 DIAGNOSIS — F411 Generalized anxiety disorder: Secondary | ICD-10-CM

## 2018-04-26 MED ORDER — MIRTAZAPINE 30 MG PO TABS: 30.0000 mg | ORAL_TABLET | Freq: Every day | ORAL | 0 refills | Status: DC

## 2018-04-26 NOTE — Telephone Encounter (Signed)
Done 90 days at [facility].

## 2018-04-26 NOTE — Telephone Encounter (Signed)
Medication refill - Fax received for a 90 day supply of patient's prescribed Mirtazapine.

## 2018-05-09 DIAGNOSIS — J029 Acute pharyngitis, unspecified: Secondary | ICD-10-CM | POA: Diagnosis not present

## 2018-05-09 DIAGNOSIS — R0982 Postnasal drip: Secondary | ICD-10-CM | POA: Diagnosis not present

## 2018-05-09 DIAGNOSIS — J309 Allergic rhinitis, unspecified: Secondary | ICD-10-CM | POA: Diagnosis not present

## 2018-05-27 ENCOUNTER — Other Ambulatory Visit: Payer: Self-pay

## 2018-05-27 ENCOUNTER — Encounter: Payer: Self-pay | Admitting: Podiatry

## 2018-05-27 ENCOUNTER — Ambulatory Visit (INDEPENDENT_AMBULATORY_CARE_PROVIDER_SITE_OTHER): Payer: Medicare Other | Admitting: Podiatry

## 2018-05-27 VITALS — Temp 97.5°F

## 2018-05-27 DIAGNOSIS — M79675 Pain in left toe(s): Secondary | ICD-10-CM | POA: Diagnosis not present

## 2018-05-27 DIAGNOSIS — B351 Tinea unguium: Secondary | ICD-10-CM

## 2018-05-27 DIAGNOSIS — M79674 Pain in right toe(s): Secondary | ICD-10-CM

## 2018-06-01 DIAGNOSIS — M0579 Rheumatoid arthritis with rheumatoid factor of multiple sites without organ or systems involvement: Secondary | ICD-10-CM | POA: Diagnosis not present

## 2018-06-01 DIAGNOSIS — Z79899 Other long term (current) drug therapy: Secondary | ICD-10-CM | POA: Diagnosis not present

## 2018-06-02 ENCOUNTER — Encounter: Payer: Self-pay | Admitting: Podiatry

## 2018-06-02 NOTE — Progress Notes (Signed)
Subjective: Leslie Gamble presents today for preventative diabetic foot care with painful, thick toenails 1-5 b/l that she cannot cut and which interfere with daily activities.  She voices no new pedal concerns on today's visit   Wenda Low, MD is her PCP.   Current Outpatient Medications:  .  acetaminophen (TYLENOL) 500 MG tablet, Take 1,000 mg by mouth every 6 (six) hours as needed for headache., Disp: , Rfl:  .  albuterol (PROVENTIL HFA;VENTOLIN HFA) 108 (90 Base) MCG/ACT inhaler, Inhale 1-2 puffs into the lungs every 6 (six) hours as needed for wheezing or shortness of breath., Disp: 1 Inhaler, Rfl: 0 .  amLODipine (NORVASC) 10 MG tablet, Take 10 mg by mouth daily. , Disp: , Rfl:  .  aspirin 81 MG chewable tablet, Chew 81 mg by mouth daily., Disp: , Rfl:  .  BD PEN NEEDLE NANO U/F 32G X 4 MM MISC, AS DIRECTED THREE TIMES A DAY SQ 30 DAYS, Disp: , Rfl: 12 .  DULoxetine (CYMBALTA) 60 MG capsule, Take 1 capsule (60 mg total) by mouth 2 (two) times daily., Disp: 60 capsule, Rfl: 2 .  ferrous sulfate 325 (65 FE) MG EC tablet, Take 325 mg by mouth daily with breakfast., Disp: , Rfl:  .  fluticasone furoate-vilanterol (BREO ELLIPTA) 100-25 MCG/INH AEPB, Inhale 1 puff into the lungs daily., Disp: , Rfl:  .  insulin aspart (NOVOLOG) 100 UNIT/ML injection, Inject 0-9 Units into the skin 3 (three) times daily with meals. CBG < 70: implement hypoglycemia protocol CBG 70 - 120: 0 units CBG 121 - 150: 1 unit CBG 151 - 200: 2 units CBG 201 - 250: 3 units CBG 251 - 300: 5 units CBG 301 - 350: 7 units CBG 351 - 400: 9 units CBG > 400: call MD., Disp: , Rfl:  .  leflunomide (ARAVA) 20 MG tablet, Take 20 mg by mouth daily., Disp: , Rfl: 3 .  losartan (COZAAR) 100 MG tablet, Take 1 tablet (100 mg total) by mouth daily., Disp: , Rfl:  .  metFORMIN (GLUCOPHAGE) 1000 MG tablet, Take 1,000 mg by mouth 2 (two) times daily with a meal., Disp: , Rfl:  .  mirtazapine (REMERON) 30 MG tablet, Take 1 tablet (30 mg  total) by mouth at bedtime., Disp: 90 tablet, Rfl: 0 .  mometasone-formoterol (DULERA) 100-5 MCG/ACT AERO, Inhale 2 puffs into the lungs 2 (two) times daily., Disp: 1 Inhaler, Rfl: 5 .  ONETOUCH VERIO test strip, USE TO TEST BLOOD GLUCOSE 3 TIMES A DAY THREE TIMES A DAY & AS DIRECTED DX E11.9 90 DAYS, Disp: , Rfl: 3 .  Oxycodone HCl 10 MG TABS, Take 0.5 tablets (5 mg total) by mouth every 6 (six) hours as needed. For knee pain, Disp: 6 tablet, Rfl: 0 .  pantoprazole (PROTONIX) 40 MG tablet, Take 40 mg by mouth daily. , Disp: , Rfl: 3 .  polyethylene glycol (MIRALAX / GLYCOLAX) packet, Take 17 g by mouth daily. (Patient taking differently: Take 17 g by mouth daily as needed for mild constipation. ), Disp: 30 each, Rfl: 2 .  pravastatin (PRAVACHOL) 10 MG tablet, Take 10 mg by mouth daily., Disp: , Rfl: 5 .  predniSONE (DELTASONE) 5 MG tablet, Take 5 mg by mouth daily as needed., Disp: , Rfl:  .  terconazole (TERAZOL 7) 0.4 % vaginal cream, Place 1 applicator vaginally at bedtime., Disp: 45 g, Rfl: 0 .  TRESIBA FLEXTOUCH 100 UNIT/ML SOPN FlexTouch Pen, , Disp: , Rfl:   Current  Facility-Administered Medications:  .  nystatin cream (MYCOSTATIN), , Topical, BID, Young, Candiss Norse, NP  Allergies  Allergen Reactions  . Avelox [Moxifloxacin Hcl In Nacl] Itching and Swelling  . Cephalexin     Nose swelling  . Ciprofloxacin Swelling  . Clarithromycin Swelling  . Clindamycin Hcl Swelling  . Codeine     Lip swelling  . Diflucan [Fluconazole] Diarrhea  . Lidocaine Swelling  . Metronidazole Swelling  . Nitrofurantoin Monohyd Macro Swelling  . Other     Magic mouthwash   . Penicillins Swelling  . Shrimp [Shellfish Allergy] Swelling    In nose and lips  . Sitagliptin Swelling  . Sulfa Antibiotics Swelling  . Tetracyclines & Related Swelling  . Tramadol Hcl     REACTION: facial swelling, dyspnea    Objective:  Vascular Examination: Capillary refill time <3 seconds x 10 digits.  Dorsalis pedis  and Posterior tibial pulses palpable b/l.  Digital hair absent x 10 digits.  Skin temperature gradient WNL b/l.  Dermatological Examination: Skin with normal turgor, texture and tone b/l.  Toenails 1-5 b/l discolored, thick, dystrophic with subungual debris and pain with palpation to nailbeds due to thickness of nails.  Musculoskeletal: Muscle strength 5/5 to all LE muscle groups  No gross bony deformities b/l.  No pain, crepitus or joint limitation noted with ROM.   Neurological: Sensation diminished with 10 gram monofilament.  Assessment: Painful onychomycosis toenails 1-5 b/l   Plan: 1. Toenails 1-5 b/l were debrided in length and girth without iatrogenic bleeding. 2. Patient to continue soft, supportive shoe gear daily. 3. Patient to report any pedal injuries to medical professional immediately. 4. Follow up 3 months.  5. Patient/POA to call should there be a concern in the interim.

## 2018-06-08 DIAGNOSIS — M0579 Rheumatoid arthritis with rheumatoid factor of multiple sites without organ or systems involvement: Secondary | ICD-10-CM | POA: Diagnosis not present

## 2018-06-08 DIAGNOSIS — M2550 Pain in unspecified joint: Secondary | ICD-10-CM | POA: Diagnosis not present

## 2018-06-08 DIAGNOSIS — G5601 Carpal tunnel syndrome, right upper limb: Secondary | ICD-10-CM | POA: Diagnosis not present

## 2018-06-08 DIAGNOSIS — M150 Primary generalized (osteo)arthritis: Secondary | ICD-10-CM | POA: Diagnosis not present

## 2018-06-08 DIAGNOSIS — Z79899 Other long term (current) drug therapy: Secondary | ICD-10-CM | POA: Diagnosis not present

## 2018-06-15 DIAGNOSIS — M0579 Rheumatoid arthritis with rheumatoid factor of multiple sites without organ or systems involvement: Secondary | ICD-10-CM | POA: Diagnosis not present

## 2018-06-21 ENCOUNTER — Encounter (HOSPITAL_COMMUNITY): Payer: Self-pay | Admitting: Psychiatry

## 2018-06-21 ENCOUNTER — Ambulatory Visit (INDEPENDENT_AMBULATORY_CARE_PROVIDER_SITE_OTHER): Payer: Medicare Other | Admitting: Psychiatry

## 2018-06-21 ENCOUNTER — Other Ambulatory Visit: Payer: Self-pay

## 2018-06-21 DIAGNOSIS — F411 Generalized anxiety disorder: Secondary | ICD-10-CM | POA: Diagnosis not present

## 2018-06-21 DIAGNOSIS — F331 Major depressive disorder, recurrent, moderate: Secondary | ICD-10-CM

## 2018-06-21 MED ORDER — DULOXETINE HCL 60 MG PO CPEP: 60.0000 mg | ORAL_CAPSULE | Freq: Two times a day (BID) | ORAL | 1 refills | Status: DC

## 2018-06-21 MED ORDER — MIRTAZAPINE 30 MG PO TABS: 30.0000 mg | ORAL_TABLET | Freq: Every day | ORAL | 0 refills | Status: DC

## 2018-06-21 NOTE — Progress Notes (Signed)
Virtual Visit via Telephone Note  I connected with Leslie Gamble on 06/21/18 at  1:00 PM EDT by telephone and verified that I am speaking with the correct person using two identifiers.   I discussed the limitations, risks, security and privacy concerns of performing an evaluation and management service by telephone and the availability of in person appointments. I also discussed with the patient that there may be a patient responsible charge related to this service. The patient expressed understanding and agreed to proceed.   History of Present Illness: Patient was evaluated through phone session.  She is sleeping better with the Remeron however she still feels anxious during the day.  She admitted forgetful and has not seen Dr. Deforest Hoyles since the last visit.  We have recommended neuroimaging studies but due to COVID-19 she could not go to see physician.  She feels very nervous in the morning and admitted that she is not taking the Cymbalta as prescribed.  Patient told she forgets sometimes and those days she feels very nervous.  She denies any crying spells or any feeling of hopelessness or worthlessness.  She denies any agitation, irritability or any paranoia.  She denies any panic attack.  Reggie level is fair.  She has multiple health issues.  She has not had blood work for hemoglobin A1c since not able to keep appointment due to COVID-19.  She reported no tremors, shakes or any EPS.  She also not compliant with therapy as she forgot to keep appointment with Janett Billow.  Patient denies drinking or using any illegal substances.  Her son lives with the patient and patient told some time he get into her nerves.    Past Psychiatric History: Reviewed. H/O inpatient treatment in 1993/04/23 after husband died. No h/o suicidal attempt and psychosis. Had tried Prozac, Paxil, Zoloft, trazodone, Xanax but do not recall details. Tried lithium but developed toxicity and required hospitalization on the medical  floor.     Psychiatric Specialty Exam: Physical Exam  ROS  There were no vitals taken for this visit.There is no height or weight on file to calculate BMI.  General Appearance: NA  Eye Contact:  NA  Speech:  Slow  Volume:  Decreased  Mood:  Euthymic  Affect:  NA  Thought Process:  Goal Directed  Orientation:  Full (Time, Place, and Person)  Thought Content:  Logical  Suicidal Thoughts:  No  Homicidal Thoughts:  No  Memory:  Immediate;   Fair Recent;   Fair Remote;   Fair  Judgement:  Good  Insight:  Good  Psychomotor Activity:  NA  Concentration:  Concentration: Fair and Attention Span: Fair  Recall:  [employer] of Knowledge:  Fair  Language:  Good  Akathisia:  NA  Handed:  Right  AIMS (if indicated):     Assets:  Communication Skills Desire for Improvement Housing Resilience Social Support  ADL's:  Intact  Cognition:  Impaired,  Mild  Sleep:         Assessment and Plan: Major depressive disorder, recurrent.  Generalized anxiety disorder.  Mild cognitive impairment.  Discussed compliance issue.  Recommend if she does not take the Cymbalta on time she may get withdrawals and her anxiety may get worse.  Patient promised she will keep a reminder to take the Cymbalta on time.  We will defer adding any more medication at this time since patient compliance is questionable.  I also recommend that she should call our office to schedule appointment with the therapist  which she agree.  She is sleeping better since taking the Remeron 30 mg at bedtime.  I encouraged to contact primary care physician if they are open to see the patient and to do blood work and neuroimaging studies to rule out dementia.  Discussed medication side effects and benefits.  Recommended to call us back if she has any question or any concern.  Follow-up in 3 months.  Follow Up Instructions:    I discussed the assessment and treatment plan with the patient. The patient was provided an opportunity to ask  questions and all were answered. The patient agreed with the plan and demonstrated an understanding of the instructions.   The patient was advised to call back or seek an in-person evaluation if the symptoms worsen or if the condition fails to improve as anticipated.  I provided 15 minutes of non-face-to-face time during this encounter.   Kathlee Nations, MD

## 2018-06-30 ENCOUNTER — Ambulatory Visit (HOSPITAL_COMMUNITY): Payer: Medicare Other | Admitting: Psychiatry

## 2018-06-30 DIAGNOSIS — H524 Presbyopia: Secondary | ICD-10-CM | POA: Diagnosis not present

## 2018-06-30 DIAGNOSIS — E119 Type 2 diabetes mellitus without complications: Secondary | ICD-10-CM | POA: Diagnosis not present

## 2018-06-30 DIAGNOSIS — H04123 Dry eye syndrome of bilateral lacrimal glands: Secondary | ICD-10-CM | POA: Diagnosis not present

## 2018-06-30 DIAGNOSIS — H25013 Cortical age-related cataract, bilateral: Secondary | ICD-10-CM | POA: Diagnosis not present

## 2018-07-14 DIAGNOSIS — F324 Major depressive disorder, single episode, in partial remission: Secondary | ICD-10-CM | POA: Diagnosis not present

## 2018-07-14 DIAGNOSIS — J309 Allergic rhinitis, unspecified: Secondary | ICD-10-CM | POA: Diagnosis not present

## 2018-07-14 DIAGNOSIS — I10 Essential (primary) hypertension: Secondary | ICD-10-CM | POA: Diagnosis not present

## 2018-07-14 DIAGNOSIS — E1140 Type 2 diabetes mellitus with diabetic neuropathy, unspecified: Secondary | ICD-10-CM | POA: Diagnosis not present

## 2018-07-14 DIAGNOSIS — Z7984 Long term (current) use of oral hypoglycemic drugs: Secondary | ICD-10-CM | POA: Diagnosis not present

## 2018-07-14 DIAGNOSIS — J449 Chronic obstructive pulmonary disease, unspecified: Secondary | ICD-10-CM | POA: Diagnosis not present

## 2018-07-14 DIAGNOSIS — M069 Rheumatoid arthritis, unspecified: Secondary | ICD-10-CM | POA: Diagnosis not present

## 2018-07-14 DIAGNOSIS — I700 Atherosclerosis of aorta: Secondary | ICD-10-CM | POA: Diagnosis not present

## 2018-07-14 DIAGNOSIS — K219 Gastro-esophageal reflux disease without esophagitis: Secondary | ICD-10-CM | POA: Diagnosis not present

## 2018-07-27 DIAGNOSIS — M0579 Rheumatoid arthritis with rheumatoid factor of multiple sites without organ or systems involvement: Secondary | ICD-10-CM | POA: Diagnosis not present

## 2018-07-29 DIAGNOSIS — R0981 Nasal congestion: Secondary | ICD-10-CM | POA: Diagnosis not present

## 2018-08-18 DIAGNOSIS — Z7984 Long term (current) use of oral hypoglycemic drugs: Secondary | ICD-10-CM | POA: Diagnosis not present

## 2018-08-18 DIAGNOSIS — J45909 Unspecified asthma, uncomplicated: Secondary | ICD-10-CM | POA: Diagnosis not present

## 2018-08-18 DIAGNOSIS — F324 Major depressive disorder, single episode, in partial remission: Secondary | ICD-10-CM | POA: Diagnosis not present

## 2018-08-18 DIAGNOSIS — E1165 Type 2 diabetes mellitus with hyperglycemia: Secondary | ICD-10-CM | POA: Diagnosis not present

## 2018-08-18 DIAGNOSIS — M8580 Other specified disorders of bone density and structure, unspecified site: Secondary | ICD-10-CM | POA: Diagnosis not present

## 2018-08-18 DIAGNOSIS — E1140 Type 2 diabetes mellitus with diabetic neuropathy, unspecified: Secondary | ICD-10-CM | POA: Diagnosis not present

## 2018-08-18 DIAGNOSIS — E119 Type 2 diabetes mellitus without complications: Secondary | ICD-10-CM | POA: Diagnosis not present

## 2018-08-18 DIAGNOSIS — I10 Essential (primary) hypertension: Secondary | ICD-10-CM | POA: Diagnosis not present

## 2018-08-18 DIAGNOSIS — M179 Osteoarthritis of knee, unspecified: Secondary | ICD-10-CM | POA: Diagnosis not present

## 2018-08-18 DIAGNOSIS — J449 Chronic obstructive pulmonary disease, unspecified: Secondary | ICD-10-CM | POA: Diagnosis not present

## 2018-08-18 DIAGNOSIS — M069 Rheumatoid arthritis, unspecified: Secondary | ICD-10-CM | POA: Diagnosis not present

## 2018-08-26 ENCOUNTER — Other Ambulatory Visit: Payer: Self-pay

## 2018-08-26 ENCOUNTER — Encounter: Payer: Self-pay | Admitting: Podiatry

## 2018-08-26 ENCOUNTER — Ambulatory Visit (INDEPENDENT_AMBULATORY_CARE_PROVIDER_SITE_OTHER): Payer: Medicare Other | Admitting: Podiatry

## 2018-08-26 DIAGNOSIS — B351 Tinea unguium: Secondary | ICD-10-CM | POA: Diagnosis not present

## 2018-08-26 DIAGNOSIS — M79674 Pain in right toe(s): Secondary | ICD-10-CM | POA: Diagnosis not present

## 2018-08-26 DIAGNOSIS — M79675 Pain in left toe(s): Secondary | ICD-10-CM | POA: Diagnosis not present

## 2018-08-26 NOTE — Patient Instructions (Signed)
Diabetes Mellitus and Foot Care Foot care is an important part of your health, especially when you have diabetes. Diabetes may cause you to have problems because of poor blood flow (circulation) to your feet and legs, which can cause your skin to:  Become thinner and drier.  Break more easily.  Heal more slowly.  Peel and crack. You may also have nerve damage (neuropathy) in your legs and feet, causing decreased feeling in them. This means that you may not notice minor injuries to your feet that could lead to more serious problems. Noticing and addressing any potential problems early is the best way to prevent future foot problems. How to care for your feet Foot hygiene  Wash your feet daily with warm water and mild soap. Do not use hot water. Then, pat your feet and the areas between your toes until they are completely dry. Do not soak your feet as this can dry your skin.  Trim your toenails straight across. Do not dig under them or around the cuticle. File the edges of your nails with an emery board or nail file.  Apply a moisturizing lotion or petroleum jelly to the skin on your feet and to dry, brittle toenails. Use lotion that does not contain alcohol and is unscented. Do not apply lotion between your toes. Shoes and socks  Wear clean socks or stockings every day. Make sure they are not too tight. Do not wear knee-high stockings since they may decrease blood flow to your legs.  Wear shoes that fit properly and have enough cushioning. Always look in your shoes before you put them on to be sure there are no objects inside.  To break in new shoes, wear them for just a few hours a day. This prevents injuries on your feet. Wounds, scrapes, corns, and calluses  Check your feet daily for blisters, cuts, bruises, sores, and redness. If you cannot see the bottom of your feet, use a mirror or ask someone for help.  Do not cut corns or calluses or try to remove them with medicine.  If you  find a minor scrape, cut, or break in the skin on your feet, keep it and the skin around it clean and dry. You may clean these areas with mild soap and water. Do not clean the area with peroxide, alcohol, or iodine.  If you have a wound, scrape, corn, or callus on your foot, look at it several times a day to make sure it is healing and not infected. Check for: ? Redness, swelling, or pain. ? Fluid or blood. ? Warmth. ? Pus or a bad smell. General instructions  Do not cross your legs. This may decrease blood flow to your feet.  Do not use heating pads or hot water bottles on your feet. They may burn your skin. If you have lost feeling in your feet or legs, you may not know this is happening until it is too late.  Protect your feet from hot and cold by wearing shoes, such as at the beach or on hot pavement.  Schedule a complete foot exam at least once a year (annually) or more often if you have foot problems. If you have foot problems, report any cuts, sores, or bruises to your health care provider immediately. Contact a health care provider if:  You have a medical condition that increases your risk of infection and you have any cuts, sores, or bruises on your feet.  You have an injury that is not   healing.  You have redness on your legs or feet.  You feel burning or tingling in your legs or feet.  You have pain or cramps in your legs and feet.  Your legs or feet are numb.  Your feet always feel cold.  You have pain around a toenail. Get help right away if:  You have a wound, scrape, corn, or callus on your foot and: ? You have pain, swelling, or redness that gets worse. ? You have fluid or blood coming from the wound, scrape, corn, or callus. ? Your wound, scrape, corn, or callus feels warm to the touch. ? You have pus or a bad smell coming from the wound, scrape, corn, or callus. ? You have a fever. ? You have a red line going up your leg. Summary  Check your feet every day  for cuts, sores, red spots, swelling, and blisters.  Moisturize feet and legs daily.  Wear shoes that fit properly and have enough cushioning.  If you have foot problems, report any cuts, sores, or bruises to your health care provider immediately.  Schedule a complete foot exam at least once a year (annually) or more often if you have foot problems. This information is not intended to replace advice given to you by your health care provider. Make sure you discuss any questions you have with your health care provider. Document Released: 01/03/2000 Document Revised: 02/17/2017 Document Reviewed: 02/07/2016 Elsevier Patient Education  2020 [employer]   Onychomycosis/Fungal Toenails  WHAT IS IT? An infection that lies within the keratin of your nail plate that is caused by a fungus.  WHY ME? Fungal infections affect all ages, sexes, races, and creeds.  There may be many factors that predispose you to a fungal infection such as age, coexisting medical conditions such as diabetes, or an autoimmune disease; stress, medications, fatigue, genetics, etc.  Bottom line: fungus thrives in a warm, moist environment and your shoes offer such a location.  IS IT CONTAGIOUS? Theoretically, yes.  You do not want to share shoes, nail clippers or files with someone who has fungal toenails.  Walking around barefoot in the same room or sleeping in the same bed is unlikely to transfer the organism.  It is important to realize, however, that fungus can spread easily from one nail to the next on the same foot.  HOW DO WE TREAT THIS?  There are several ways to treat this condition.  Treatment may depend on many factors such as age, medications, pregnancy, liver and kidney conditions, etc.  It is best to ask your doctor which options are available to you.  1. No treatment.   Unlike many other medical concerns, you can live with this condition.  However for many people this can be a painful condition and may lead to  ingrown toenails or a bacterial infection.  It is recommended that you keep the nails cut short to help reduce the amount of fungal nail. 2. Topical treatment.  These range from herbal remedies to prescription strength nail lacquers.  About 40-50% effective, topicals require twice daily application for approximately 9 to 12 months or until an entirely new nail has grown out.  The most effective topicals are medical grade medications available through physicians offices. 3. Oral antifungal medications.  With an 80-90% cure rate, the most common oral medication requires 3 to 4 months of therapy and stays in your system for a year as the new nail grows out.  Oral antifungal medications do require   blood work to make sure it is a safe drug for you.  A liver function panel will be performed prior to starting the medication and after the first month of treatment.  It is important to have the blood work performed to avoid any harmful side effects.  In general, this medication safe but blood work is required. 4. Laser Therapy.  This treatment is performed by applying a specialized laser to the affected nail plate.  This therapy is noninvasive, fast, and non-painful.  It is not covered by insurance and is therefore, out of pocket.  The results have been very good with a 80-95% cure rate.  [facility] is the only practice in the area to offer this therapy. 5. Permanent Nail Avulsion.  Removing the entire nail so that a new nail will not grow back. 

## 2018-08-29 ENCOUNTER — Other Ambulatory Visit: Payer: Self-pay

## 2018-08-29 ENCOUNTER — Inpatient Hospital Stay (HOSPITAL_COMMUNITY)
Admission: EM | Admit: 2018-08-29 | Discharge: 2018-09-02 | DRG: 641 | Disposition: A | Discharge status: Home Health | Payer: Medicare Other | Attending: Internal Medicine | Admitting: Internal Medicine

## 2018-08-29 ENCOUNTER — Emergency Department (HOSPITAL_COMMUNITY): Payer: Medicare Other

## 2018-08-29 DIAGNOSIS — Z882 Allergy status to sulfonamides status: Secondary | ICD-10-CM

## 2018-08-29 DIAGNOSIS — W19XXXA Unspecified fall, initial encounter: Secondary | ICD-10-CM | POA: Diagnosis present

## 2018-08-29 DIAGNOSIS — M25462 Effusion, left knee: Secondary | ICD-10-CM

## 2018-08-29 DIAGNOSIS — Z885 Allergy status to narcotic agent status: Secondary | ICD-10-CM

## 2018-08-29 DIAGNOSIS — E872 Acidosis: Secondary | ICD-10-CM | POA: Diagnosis present

## 2018-08-29 DIAGNOSIS — Z833 Family history of diabetes mellitus: Secondary | ICD-10-CM

## 2018-08-29 DIAGNOSIS — Z7951 Long term (current) use of inhaled steroids: Secondary | ICD-10-CM

## 2018-08-29 DIAGNOSIS — I4581 Long QT syndrome: Secondary | ICD-10-CM | POA: Diagnosis not present

## 2018-08-29 DIAGNOSIS — I10 Essential (primary) hypertension: Secondary | ICD-10-CM | POA: Diagnosis not present

## 2018-08-29 DIAGNOSIS — E860 Dehydration: Principal | ICD-10-CM | POA: Diagnosis present

## 2018-08-29 DIAGNOSIS — E785 Hyperlipidemia, unspecified: Secondary | ICD-10-CM | POA: Diagnosis present

## 2018-08-29 DIAGNOSIS — F32A Depression, unspecified: Secondary | ICD-10-CM | POA: Diagnosis present

## 2018-08-29 DIAGNOSIS — F411 Generalized anxiety disorder: Secondary | ICD-10-CM

## 2018-08-29 DIAGNOSIS — R4781 Slurred speech: Secondary | ICD-10-CM | POA: Diagnosis not present

## 2018-08-29 DIAGNOSIS — Z8249 Family history of ischemic heart disease and other diseases of the circulatory system: Secondary | ICD-10-CM

## 2018-08-29 DIAGNOSIS — Z79899 Other long term (current) drug therapy: Secondary | ICD-10-CM

## 2018-08-29 DIAGNOSIS — S82112A Displaced fracture of left tibial spine, initial encounter for closed fracture: Secondary | ICD-10-CM | POA: Diagnosis not present

## 2018-08-29 DIAGNOSIS — Z881 Allergy status to other antibiotic agents status: Secondary | ICD-10-CM

## 2018-08-29 DIAGNOSIS — Z880 Allergy status to penicillin: Secondary | ICD-10-CM

## 2018-08-29 DIAGNOSIS — Z794 Long term (current) use of insulin: Secondary | ICD-10-CM

## 2018-08-29 DIAGNOSIS — E876 Hypokalemia: Secondary | ICD-10-CM

## 2018-08-29 DIAGNOSIS — E119 Type 2 diabetes mellitus without complications: Secondary | ICD-10-CM

## 2018-08-29 DIAGNOSIS — Z888 Allergy status to other drugs, medicaments and biological substances status: Secondary | ICD-10-CM

## 2018-08-29 DIAGNOSIS — R531 Weakness: Secondary | ICD-10-CM | POA: Diagnosis not present

## 2018-08-29 DIAGNOSIS — Z20828 Contact with and (suspected) exposure to other viral communicable diseases: Secondary | ICD-10-CM | POA: Diagnosis not present

## 2018-08-29 DIAGNOSIS — J45909 Unspecified asthma, uncomplicated: Secondary | ICD-10-CM | POA: Diagnosis not present

## 2018-08-29 DIAGNOSIS — M069 Rheumatoid arthritis, unspecified: Secondary | ICD-10-CM | POA: Diagnosis present

## 2018-08-29 DIAGNOSIS — M47816 Spondylosis without myelopathy or radiculopathy, lumbar region: Secondary | ICD-10-CM | POA: Diagnosis present

## 2018-08-29 DIAGNOSIS — F329 Major depressive disorder, single episode, unspecified: Secondary | ICD-10-CM | POA: Diagnosis present

## 2018-08-29 DIAGNOSIS — M25562 Pain in left knee: Secondary | ICD-10-CM

## 2018-08-29 DIAGNOSIS — M160 Bilateral primary osteoarthritis of hip: Secondary | ICD-10-CM | POA: Diagnosis present

## 2018-08-29 DIAGNOSIS — R9431 Abnormal electrocardiogram [ECG] [EKG]: Secondary | ICD-10-CM

## 2018-08-29 DIAGNOSIS — Z87891 Personal history of nicotine dependence: Secondary | ICD-10-CM

## 2018-08-29 DIAGNOSIS — Z91013 Allergy to seafood: Secondary | ICD-10-CM

## 2018-08-29 DIAGNOSIS — Z7982 Long term (current) use of aspirin: Secondary | ICD-10-CM

## 2018-08-29 LAB — CBC WITH DIFFERENTIAL/PLATELET
Abs Immature Granulocytes: 0.02 10*3/uL (ref 0.00–0.07)
Basophils Absolute: 0.1 10*3/uL (ref 0.0–0.1)
Basophils Relative: 1 %
Eosinophils Absolute: 0 10*3/uL (ref 0.0–0.5)
Eosinophils Relative: 0 %
HCT: 41.5 % (ref 36.0–46.0)
Hemoglobin: 13.2 g/dL (ref 12.0–15.0)
Immature Granulocytes: 0 %
Lymphocytes Relative: 23 %
Lymphs Abs: 2.3 10*3/uL (ref 0.7–4.0)
MCH: 28.1 pg (ref 26.0–34.0)
MCHC: 31.8 g/dL (ref 30.0–36.0)
MCV: 88.3 fL (ref 80.0–100.0)
Monocytes Absolute: 0.7 10*3/uL (ref 0.1–1.0)
Monocytes Relative: 7 %
Neutro Abs: 6.9 10*3/uL (ref 1.7–7.7)
Neutrophils Relative %: 69 %
Platelets: 305 10*3/uL (ref 150–400)
RBC: 4.7 MIL/uL (ref 3.87–5.11)
RDW: 14.4 % (ref 11.5–15.5)
WBC: 9.9 10*3/uL (ref 4.0–10.5)
nRBC: 0 % (ref 0.0–0.2)

## 2018-08-29 LAB — URINALYSIS, ROUTINE W REFLEX MICROSCOPIC
Bacteria, UA: NONE SEEN
Bilirubin Urine: NEGATIVE
Glucose, UA: NEGATIVE mg/dL
Ketones, ur: NEGATIVE mg/dL
Nitrite: NEGATIVE
Protein, ur: 100 mg/dL — AB
Specific Gravity, Urine: 1.008 (ref 1.005–1.030)
pH: 6 (ref 5.0–8.0)

## 2018-08-29 LAB — COMPREHENSIVE METABOLIC PANEL
ALT: 15 U/L (ref 0–44)
AST: 31 U/L (ref 15–41)
Albumin: 3.4 g/dL — ABNORMAL LOW (ref 3.5–5.0)
Alkaline Phosphatase: 82 U/L (ref 38–126)
Anion gap: 12 (ref 5–15)
BUN: 11 mg/dL (ref 8–23)
CO2: 25 mmol/L (ref 22–32)
Calcium: 8.8 mg/dL — ABNORMAL LOW (ref 8.9–10.3)
Chloride: 107 mmol/L (ref 98–111)
Creatinine, Ser: 0.69 mg/dL (ref 0.44–1.00)
GFR calc Af Amer: >60 mL/min (ref >60)
GFR calc non Af Amer: >60 mL/min (ref >60)
Glucose, Bld: 95 mg/dL (ref 70–99)
Potassium: 4 mmol/L (ref 3.5–5.1)
Sodium: 144 mmol/L (ref 135–145)
Total Bilirubin: 1.2 mg/dL (ref 0.3–1.2)
Total Protein: 7.1 g/dL (ref 6.5–8.1)

## 2018-08-29 LAB — CBG MONITORING, ED: Glucose-Capillary: 177 mg/dL — ABNORMAL HIGH (ref 70–99)

## 2018-08-29 LAB — RAPID URINE DRUG SCREEN, HOSP PERFORMED
Amphetamines: NOT DETECTED
Barbiturates: NOT DETECTED
Benzodiazepines: POSITIVE — AB
Cocaine: NOT DETECTED
Opiates: NOT DETECTED
Tetrahydrocannabinol: NOT DETECTED

## 2018-08-29 LAB — PHOSPHORUS: Phosphorus: 2.6 mg/dL (ref 2.5–4.6)

## 2018-08-29 LAB — CK: Total CK: 401 U/L — ABNORMAL HIGH (ref 38–234)

## 2018-08-29 LAB — GLUCOSE, CAPILLARY: Glucose-Capillary: 226 mg/dL — ABNORMAL HIGH (ref 70–99)

## 2018-08-29 LAB — HEMOGLOBIN A1C
Hgb A1c MFr Bld: 6.3 % — ABNORMAL HIGH (ref 4.8–5.6)
Mean Plasma Glucose: 134.11 mg/dL

## 2018-08-29 LAB — TSH: TSH: 0.457 u[IU]/mL (ref 0.350–4.500)

## 2018-08-29 LAB — MRSA PCR SCREENING: MRSA by PCR: NEGATIVE

## 2018-08-29 LAB — TROPONIN I (HIGH SENSITIVITY)
Troponin I (High Sensitivity): 13 ng/L (ref <18)
Troponin I (High Sensitivity): 15 ng/L (ref <18)

## 2018-08-29 LAB — POTASSIUM: Potassium: 2.8 mmol/L — ABNORMAL LOW (ref 3.5–5.1)

## 2018-08-29 LAB — MAGNESIUM: Magnesium: 1.5 mg/dL — ABNORMAL LOW (ref 1.7–2.4)

## 2018-08-29 LAB — LACTIC ACID, PLASMA
Lactic Acid, Venous: 2.4 mmol/L (ref 0.5–1.9)
Lactic Acid, Venous: 2 mmol/L (ref 0.5–1.9)

## 2018-08-29 LAB — SARS CORONAVIRUS 2 BY RT PCR (HOSPITAL ORDER, PERFORMED IN ~~LOC~~ HOSPITAL LAB): SARS Coronavirus 2: NEGATIVE

## 2018-08-29 MED ORDER — ACETAMINOPHEN 650 MG RE SUPP: 650.0000 mg | Freq: Four times a day (QID) | RECTAL | Status: DC | PRN

## 2018-08-29 MED ORDER — LEFLUNOMIDE 20 MG PO TABS
20.0000 mg | ORAL_TABLET | Freq: Every day | ORAL | Status: DC
  Administered 2018-08-30 – 2018-09-02 (×4): 20 mg via ORAL
  Filled 2018-08-29 (×4): qty 1

## 2018-08-29 MED ORDER — LOSARTAN POTASSIUM 50 MG PO TABS
100.0000 mg | ORAL_TABLET | Freq: Every day | ORAL | Status: DC
  Administered 2018-08-29 – 2018-09-02 (×5): 100 mg via ORAL
  Filled 2018-08-29 (×5): qty 2

## 2018-08-29 MED ORDER — POTASSIUM CHLORIDE 20 MEQ/15ML (10%) PO SOLN
40.0000 meq | Freq: Once | ORAL | Status: AC
  Administered 2018-08-29: 40 meq via ORAL
  Filled 2018-08-29: qty 30

## 2018-08-29 MED ORDER — MAGNESIUM SULFATE 2 GM/50ML IV SOLN
2.0000 g | INTRAVENOUS | Status: AC
  Administered 2018-08-29: 2 g via INTRAVENOUS
  Filled 2018-08-29: qty 50

## 2018-08-29 MED ORDER — ASPIRIN EC 81 MG PO TBEC
81.0000 mg | DELAYED_RELEASE_TABLET | Freq: Every day | ORAL | Status: DC
  Administered 2018-08-29 – 2018-09-02 (×5): 81 mg via ORAL
  Filled 2018-08-29 (×8): qty 1

## 2018-08-29 MED ORDER — OXYCODONE HCL 5 MG PO TABS
5.0000 mg | ORAL_TABLET | Freq: Four times a day (QID) | ORAL | Status: DC | PRN
  Administered 2018-08-30 – 2018-09-02 (×4): 5 mg via ORAL
  Filled 2018-08-29 (×6): qty 1

## 2018-08-29 MED ORDER — PRAVASTATIN SODIUM 10 MG PO TABS
10.0000 mg | ORAL_TABLET | Freq: Every day | ORAL | Status: DC
  Administered 2018-08-29 – 2018-09-02 (×5): 10 mg via ORAL
  Filled 2018-08-29 (×5): qty 1

## 2018-08-29 MED ORDER — INSULIN ASPART 100 UNIT/ML ~~LOC~~ SOLN
0.0000 [IU] | Freq: Three times a day (TID) | SUBCUTANEOUS | Status: DC
  Administered 2018-08-30 (×2): 3 [IU] via SUBCUTANEOUS
  Administered 2018-08-31: 2 [IU] via SUBCUTANEOUS
  Administered 2018-08-31: 5 [IU] via SUBCUTANEOUS
  Administered 2018-08-31: 3 [IU] via SUBCUTANEOUS
  Administered 2018-09-01: 2 [IU] via SUBCUTANEOUS
  Administered 2018-09-01: 5 [IU] via SUBCUTANEOUS
  Administered 2018-09-01 – 2018-09-02 (×2): 2 [IU] via SUBCUTANEOUS
  Administered 2018-09-02: 7 [IU] via SUBCUTANEOUS

## 2018-08-29 MED ORDER — ALBUTEROL SULFATE HFA 108 (90 BASE) MCG/ACT IN AERS
1.0000 | INHALATION_SPRAY | Freq: Four times a day (QID) | RESPIRATORY_TRACT | Status: DC | PRN
  Administered 2018-08-31: 2 via RESPIRATORY_TRACT
  Filled 2018-08-29: qty 6.7

## 2018-08-29 MED ORDER — AMLODIPINE BESYLATE 5 MG PO TABS
10.0000 mg | ORAL_TABLET | Freq: Every day | ORAL | Status: DC
  Administered 2018-08-29 – 2018-09-02 (×5): 10 mg via ORAL
  Filled 2018-08-29 (×5): qty 2

## 2018-08-29 MED ORDER — ACETAMINOPHEN 325 MG PO TABS
650.0000 mg | ORAL_TABLET | Freq: Four times a day (QID) | ORAL | Status: DC | PRN
  Administered 2018-08-29 – 2018-09-01 (×3): 650 mg via ORAL
  Filled 2018-08-29 (×3): qty 2

## 2018-08-29 MED ORDER — POTASSIUM CHLORIDE 10 MEQ/100ML IV SOLN
10.0000 meq | INTRAVENOUS | Status: AC
  Administered 2018-08-29 (×2): 10 meq via INTRAVENOUS
  Filled 2018-08-29 (×2): qty 100

## 2018-08-29 MED ORDER — SODIUM CHLORIDE 0.9 % IV BOLUS
1000.0000 mL | Freq: Once | INTRAVENOUS | Status: AC
  Administered 2018-08-29: 1000 mL via INTRAVENOUS

## 2018-08-29 MED ORDER — SODIUM CHLORIDE 0.9% FLUSH
3.0000 mL | Freq: Two times a day (BID) | INTRAVENOUS | Status: DC
  Administered 2018-08-30 – 2018-09-02 (×7): 3 mL via INTRAVENOUS

## 2018-08-29 MED ORDER — HEPARIN SODIUM (PORCINE) 5000 UNIT/ML IJ SOLN
5000.0000 [IU] | Freq: Three times a day (TID) | INTRAMUSCULAR | Status: DC
  Administered 2018-08-29 – 2018-09-02 (×12): 5000 [IU] via SUBCUTANEOUS
  Filled 2018-08-29 (×12): qty 1

## 2018-08-29 MED ORDER — ALPRAZOLAM 0.25 MG PO TABS
0.2500 mg | ORAL_TABLET | Freq: Two times a day (BID) | ORAL | Status: DC | PRN
  Administered 2018-08-29 – 2018-09-02 (×4): 0.25 mg via ORAL
  Filled 2018-08-29 (×4): qty 1

## 2018-08-29 MED ORDER — FLUTICASONE FUROATE-VILANTEROL 100-25 MCG/INH IN AEPB
1.0000 | INHALATION_SPRAY | Freq: Every day | RESPIRATORY_TRACT | Status: DC
  Administered 2018-08-30 – 2018-09-02 (×5): 1 via RESPIRATORY_TRACT
  Filled 2018-08-29: qty 28

## 2018-08-29 MED ORDER — DULOXETINE HCL 60 MG PO CPEP
60.0000 mg | ORAL_CAPSULE | Freq: Two times a day (BID) | ORAL | Status: DC
  Administered 2018-08-29 – 2018-09-02 (×8): 60 mg via ORAL
  Filled 2018-08-29 (×8): qty 1
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
## 2018-08-29 NOTE — ED Notes (Signed)
Some asymettry noted to L side face, drooping.  Patient able to follow commands but quickly returns to sleep.

## 2018-08-29 NOTE — ED Notes (Signed)
SonEvangeline Gula [place]  [phone]
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
## 2018-08-29 NOTE — ED Notes (Signed)
Returns from xray.  Sleeping and easily roused.  Denies any needs at this time.
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
## 2018-08-29 NOTE — ED Notes (Signed)
Pt offered lunch and denies any other needs at this time.

## 2018-08-30 ENCOUNTER — Encounter (HOSPITAL_COMMUNITY): Payer: Self-pay | Admitting: Emergency Medicine

## 2018-08-30 DIAGNOSIS — Z885 Allergy status to narcotic agent status: Secondary | ICD-10-CM | POA: Diagnosis not present

## 2018-08-30 DIAGNOSIS — E876 Hypokalemia: Secondary | ICD-10-CM

## 2018-08-30 DIAGNOSIS — Z7982 Long term (current) use of aspirin: Secondary | ICD-10-CM | POA: Diagnosis not present

## 2018-08-30 DIAGNOSIS — M25562 Pain in left knee: Secondary | ICD-10-CM

## 2018-08-30 DIAGNOSIS — F329 Major depressive disorder, single episode, unspecified: Secondary | ICD-10-CM | POA: Diagnosis not present

## 2018-08-30 DIAGNOSIS — Z7951 Long term (current) use of inhaled steroids: Secondary | ICD-10-CM | POA: Diagnosis not present

## 2018-08-30 DIAGNOSIS — M069 Rheumatoid arthritis, unspecified: Secondary | ICD-10-CM | POA: Diagnosis present

## 2018-08-30 DIAGNOSIS — J45909 Unspecified asthma, uncomplicated: Secondary | ICD-10-CM | POA: Diagnosis present

## 2018-08-30 DIAGNOSIS — I10 Essential (primary) hypertension: Secondary | ICD-10-CM | POA: Diagnosis present

## 2018-08-30 DIAGNOSIS — Z8249 Family history of ischemic heart disease and other diseases of the circulatory system: Secondary | ICD-10-CM | POA: Diagnosis not present

## 2018-08-30 DIAGNOSIS — Z833 Family history of diabetes mellitus: Secondary | ICD-10-CM | POA: Diagnosis not present

## 2018-08-30 DIAGNOSIS — M25462 Effusion, left knee: Secondary | ICD-10-CM

## 2018-08-30 DIAGNOSIS — F411 Generalized anxiety disorder: Secondary | ICD-10-CM

## 2018-08-30 DIAGNOSIS — E785 Hyperlipidemia, unspecified: Secondary | ICD-10-CM | POA: Diagnosis present

## 2018-08-30 DIAGNOSIS — Z794 Long term (current) use of insulin: Secondary | ICD-10-CM | POA: Diagnosis not present

## 2018-08-30 DIAGNOSIS — E872 Acidosis: Secondary | ICD-10-CM | POA: Diagnosis present

## 2018-08-30 DIAGNOSIS — Z91013 Allergy to seafood: Secondary | ICD-10-CM | POA: Diagnosis not present

## 2018-08-30 DIAGNOSIS — S82112A Displaced fracture of left tibial spine, initial encounter for closed fracture: Secondary | ICD-10-CM | POA: Diagnosis present

## 2018-08-30 DIAGNOSIS — M47816 Spondylosis without myelopathy or radiculopathy, lumbar region: Secondary | ICD-10-CM | POA: Diagnosis present

## 2018-08-30 DIAGNOSIS — R531 Weakness: Secondary | ICD-10-CM | POA: Diagnosis not present

## 2018-08-30 DIAGNOSIS — Z20828 Contact with and (suspected) exposure to other viral communicable diseases: Secondary | ICD-10-CM | POA: Diagnosis present

## 2018-08-30 DIAGNOSIS — M160 Bilateral primary osteoarthritis of hip: Secondary | ICD-10-CM | POA: Diagnosis present

## 2018-08-30 DIAGNOSIS — E860 Dehydration: Secondary | ICD-10-CM | POA: Diagnosis present

## 2018-08-30 DIAGNOSIS — Z888 Allergy status to other drugs, medicaments and biological substances status: Secondary | ICD-10-CM | POA: Diagnosis not present

## 2018-08-30 DIAGNOSIS — Z882 Allergy status to sulfonamides status: Secondary | ICD-10-CM | POA: Diagnosis not present

## 2018-08-30 DIAGNOSIS — Z881 Allergy status to other antibiotic agents status: Secondary | ICD-10-CM | POA: Diagnosis not present

## 2018-08-30 DIAGNOSIS — E119 Type 2 diabetes mellitus without complications: Secondary | ICD-10-CM | POA: Diagnosis present

## 2018-08-30 DIAGNOSIS — Z79899 Other long term (current) drug therapy: Secondary | ICD-10-CM | POA: Diagnosis not present

## 2018-08-30 DIAGNOSIS — W19XXXA Unspecified fall, initial encounter: Secondary | ICD-10-CM | POA: Diagnosis present

## 2018-08-30 DIAGNOSIS — Z87891 Personal history of nicotine dependence: Secondary | ICD-10-CM | POA: Diagnosis not present

## 2018-08-30 DIAGNOSIS — Z880 Allergy status to penicillin: Secondary | ICD-10-CM | POA: Diagnosis not present

## 2018-08-30 LAB — CBC
HCT: 38.2 % (ref 36.0–46.0)
Hemoglobin: 12.6 g/dL (ref 12.0–15.0)
MCH: 28.4 pg (ref 26.0–34.0)
MCHC: 33 g/dL (ref 30.0–36.0)
MCV: 86.2 fL (ref 80.0–100.0)
Platelets: 300 10*3/uL (ref 150–400)
RBC: 4.43 MIL/uL (ref 3.87–5.11)
RDW: 14.2 % (ref 11.5–15.5)
WBC: 9.5 10*3/uL (ref 4.0–10.5)
nRBC: 0 % (ref 0.0–0.2)

## 2018-08-30 LAB — URINE CULTURE: Culture: NO GROWTH

## 2018-08-30 LAB — MAGNESIUM: Magnesium: 1.7 mg/dL (ref 1.7–2.4)

## 2018-08-30 LAB — BASIC METABOLIC PANEL
Anion gap: 11 (ref 5–15)
BUN: 9 mg/dL (ref 8–23)
CO2: 25 mmol/L (ref 22–32)
Calcium: 8.5 mg/dL — ABNORMAL LOW (ref 8.9–10.3)
Chloride: 108 mmol/L (ref 98–111)
Creatinine, Ser: 0.67 mg/dL (ref 0.44–1.00)
GFR calc Af Amer: >60 mL/min (ref >60)
GFR calc non Af Amer: >60 mL/min (ref >60)
Glucose, Bld: 159 mg/dL — ABNORMAL HIGH (ref 70–99)
Potassium: 3.3 mmol/L — ABNORMAL LOW (ref 3.5–5.1)
Sodium: 144 mmol/L (ref 135–145)

## 2018-08-30 LAB — GLUCOSE, CAPILLARY
Glucose-Capillary: 112 mg/dL — ABNORMAL HIGH (ref 70–99)
Glucose-Capillary: 231 mg/dL — ABNORMAL HIGH (ref 70–99)
Glucose-Capillary: 218 mg/dL — ABNORMAL HIGH (ref 70–99)
Glucose-Capillary: 183 mg/dL — ABNORMAL HIGH (ref 70–99)

## 2018-08-30 MED ORDER — MAGNESIUM SULFATE 4 GM/100ML IV SOLN
4.0000 g | Freq: Once | INTRAVENOUS | Status: AC
  Administered 2018-08-30: 4 g via INTRAVENOUS
  Filled 2018-08-30: qty 100

## 2018-08-30 MED ORDER — POTASSIUM CHLORIDE CRYS ER 20 MEQ PO TBCR
40.0000 meq | EXTENDED_RELEASE_TABLET | Freq: Once | ORAL | Status: AC
  Administered 2018-08-30: 40 meq via ORAL
  Filled 2018-08-30 (×2): qty 2

## 2018-08-30 NOTE — Evaluation (Signed)
Physical Therapy Evaluation Patient Details Name: Leslie Gamble MRN: [id number] DOB: [birth year] Today's Date: 08/30/2018   History of Present Illness  78 yo female with onset of sliding off bed was brought to hosp, noted R knee effusion and mildly displaced avulsion fractures to R L knee on medial tibial spine, intercondylar notch.  Pt complains of lumbar spine pain but has spondylosis.  PMHx:  AKI, OA, asthma, depression, DM, anxiety, HTN, aortic atherosclerosis, R frontal infarct, DDD cervical spine, anterolisthesis L4 on L5, with lumbar spondylosis, RA  Clinical Impression  Pt was up to side of bed with PT and note her control of sitting and standing balance are less than normal.  Pt is home with son who was not present for PT eval, but is involved with her care.  Pt was able to get to chair and used RW for control of standing, gait and transfers with assistance for all.  Given the depth of help needed, recommend her to CIR for strengthening and then can return home with son to continue with home therapy as she wishes.  Focus acutely on increasing standing control and increasing her ability to control transitions with RW.    Follow Up Recommendations CIR    Equipment Recommendations  None recommended by PT    Recommendations for Other Services Rehab consult     Precautions / Restrictions Precautions Precautions: Fall Restrictions Weight Bearing Restrictions: No      Mobility  Bed Mobility Overal bed mobility: Needs Assistance Bed Mobility: Supine to Sit     Supine to sit: Mod assist     General bed mobility comments: pt in recliner upon arrival  Transfers Overall transfer level: Needs assistance Equipment used: Rolling walker (2 wheeled);1 person hand held assist Transfers: Sit to/from Stand Sit to Stand: Mod assist         General transfer comment: Mod to power up and mod to assist with controlling initial balance  Ambulation/Gait Ambulation/Gait assistance: Min  assist;Mod assist Gait Distance (Feet): 4 Feet Assistive device: Rolling walker (2 wheeled);1 person hand held assist Gait Pattern/deviations: Step-to pattern;Step-through pattern;Decreased stride length;Wide base of support;Decreased weight shift to right Gait velocity: redduced Gait velocity interpretation: <1.8 ft/sec, indicate of risk for recurrent falls General Gait Details: pt is minimizing use of R knee and is in flexed R knee posture  Stairs            Wheelchair Mobility    Modified Rankin (Stroke Patients Only)       Balance Overall balance assessment: Needs assistance;History of Falls Sitting-balance support: Feet supported Sitting balance-Leahy Scale: Fair     Standing balance support: Bilateral upper extremity supported;During functional activity Standing balance-Leahy Scale: Poor                               Pertinent Vitals/Pain Pain Assessment: No/denies pain    Home Living Family/patient expects to be discharged to:: Private residence Living Arrangements: Children Available Help at Discharge: Family;Available PRN/intermittently Type of Home: House Home Access: Stairs to enter Entrance Stairs-Rails: Right;Left;Can reach both Entrance Stairs-Number of Steps: 4 Home Layout: One level Home Equipment: Cane - single point;Shower seat;Grab bars - tub/shower      Prior Function Level of Independence: Independent with assistive device(s);Needs assistance   Gait / Transfers Assistance Needed: pt reporst that she uses a RW, but was unable to specify which type. 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  ADL's /  Homemaking Assistance Needed: Pt reports that she was independent with ADLs/selfcare and light meal prep  Comments: per son pt is not doing a lot of housework     Hand Dominance   Dominant Hand: Right    Extremity/Trunk Assessment   Upper Extremity Assessment Upper Extremity Assessment: Generalized weakness    Lower Extremity  Assessment Lower Extremity Assessment: Defer to PT evaluation RLE Deficits / Details: 4- R knee strength to extend, 3+ to flex RLE Coordination: decreased gross motor    Cervical / Trunk Assessment Cervical / Trunk Assessment: Kyphotic  Communication   Communication: No difficulties  Cognition Arousal/Alertness: Awake/alert Behavior During Therapy: WFL for tasks assessed/performed Overall Cognitive Status: Impaired/Different from baseline Area of Impairment: Memory;Safety/judgement;Awareness;Problem solving                 Orientation Level: Situation   Memory: Decreased short-term memory   Safety/Judgement: Decreased awareness of safety;Decreased awareness of deficits Awareness: Intellectual Problem Solving: Requires verbal cues;Requires tactile cues        General Comments General comments (skin integrity, edema, etc.): pt is up to walk with help but is not comfortable with the activity, and is not realistic with this for home as she quickly forgets her performance    Exercises     Assessment/Plan    PT Assessment Patient needs continued PT services  PT Problem List Decreased strength;Decreased range of motion;Decreased activity tolerance;Decreased balance;Decreased mobility;Decreased coordination;Decreased cognition;Decreased knowledge of use of DME;Decreased safety awareness       PT Treatment Interventions DME instruction;Gait training;Stair training;Functional mobility training;Therapeutic activities;Therapeutic exercise;Balance training;Neuromuscular re-education;Patient/family education    PT Goals (Current goals can be found in the Care Plan section)  Acute Rehab PT Goals Patient Stated Goal: to go directly home PT Goal Formulation: With patient Time For Goal Achievement: 09/13/18 Potential to Achieve Goals: Good    Frequency Min 2X/week   Barriers to discharge Inaccessible home environment;Decreased caregiver support home with son at work for 10  hours a day    Co-evaluation               AM-PAC PT "6 Clicks" Mobility  Outcome Measure Help needed turning from your back to your side while in a flat bed without using bedrails?: A Little Help needed moving from lying on your back to sitting on the side of a flat bed without using bedrails?: A Lot Help needed moving to and from a bed to a chair (including a wheelchair)?: A Lot Help needed standing up from a chair using your arms (e.g., wheelchair or bedside chair)?: A Lot Help needed to walk in hospital room?: A Lot Help needed climbing 3-5 steps with a railing? : Total 6 Click Score: 12    End of Session Equipment Utilized During Treatment: Gait belt Activity Tolerance: Patient limited by fatigue;Other (comment)(limited by control of R knee in standing) Patient left: in chair;with call bell/phone within reach;with chair alarm set Nurse Communication: Mobility status PT Visit Diagnosis: Unsteadiness on feet (R26.81);Muscle weakness (generalized) (M62.81);Difficulty in walking, not elsewhere classified (R26.2);History of falling (Z91.81);Other abnormalities of gait and mobility (R26.89)    Time: 1025-1056 PT Time Calculation (min) (ACUTE ONLY): 31 min   Charges:   PT Evaluation $PT Eval Moderate Complexity: 1 Mod PT Treatments $Therapeutic Activity: 8-22 mins       Ramond Dial 08/30/2018, 2:49 PM   Mee Hives, PT MS Acute Rehab Dept. Number: [place]

## 2018-08-30 NOTE — Progress Notes (Signed)
PROGRESS NOTE    Leslie Gamble  JYN:[id number] DOB: October 04, 1940 DOA: 08/29/2018 PCP: Wenda Low, MD    Brief Narrative: HPI per Dr. Sula Rumple is a 78 y.o. female with medical history significant for type 2 diabetes, hypertension, hyperlipidemia, asthma, rheumatoid arthritis, depression, generalized anxiety disorder who presented to the ED for evaluation of generalized weakness.  Patient states she was in her usual state of health until early this morning around 1 AM when she attempted to get out of bed to go to the bathroom but felt weak in both of her legs.  She had difficulty standing on her own power and states she "slid" gently off her bed onto the floor without fall or injury.  She had no loss of consciousness, numbness/tingling, loss of bowel/bladder control, chest pain, palpitations, dyspnea, cough, diarrhea, or constipation.  She reports nausea without emesis.  She denies any back pain.  Patient states she normally ambulates with the use of a cane when outside the house but does not use assistive devices while in the home.  She lives with her son who called EMS services as she had continued weakness in her legs.  She reports a history of rheumatoid arthritis for which she takes leflunomide.  Her RA normally affects her joints of her hands and previously the knees however improved after beginning antirheumatic therapy.  ED Course:  Initial vitals showed BP 152/81, pulse 82, RR 11, temp 98.2 Fahrenheit, SPO2 96% on room air.  Labs notable for potassium 2.8, magnesium 1.5, sodium 144, bicarb 25, BUN 11, creatinine 0.69, CK 401, WBC 9.9, hemoglobin 13.2, platelets 305,000, UDS positive for benzodiazepines (expected as she takes alprazolam at home).  UA was negative for UTI.  High-sensitivity troponin I was 13 and 15.  Portable chest x-ray showed minimal left midlung scarring without focal consolidation or edema.  X-ray lumbar spine showed mild lumbar spondylosis without  acute findings.  Bilateral hip x-ray showed mild bilateral hip OA without evidence of hip fracture or dislocation.  Left knee x-ray showed findings suggestive of a lipohemarthrosis without discrete fracture line.  CT left knee showed small osseous fragments adjacent to the medial tibial spine and intercondylar notch felt to represent mildly displaced avulsion fractures, moderate knee joint effusion, and suboptimally evaluated heterogeneous signal within the region of the ACL.  CT head and cervical spine without contrast were negative for acute intracranial abnormalities or cervical fracture or acute finding.  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  Patient was given 1 L normal saline, IV magnesium, and IV K 10 mEq x 3 runs.  The hospitalist service was consulted for further evaluation and management.   Assessment & Plan:   Principal Problem:   Generalized weakness Active Problems:   Diabetes mellitus, type 2 (HCC)   Depression   Essential hypertension   Asthma   Hypomagnesemia   Hypokalemia   Pain and swelling of left knee   Generalized anxiety disorder  1 generalized weakness Affecting bilateral lower extremities.  Patient with some complaints of lower bilateral back pain somewhat reproducible.  Patient denies any bladder or stool incontinence.  Plain films of the back and bilateral hip are negative for any acute findings.  Generalized weakness likely multifactorial secondary to electrolyte abnormalities, deconditioning, known lumbar/rheumatoid disease.  Phosphorus levels pending.  Continue potassium and magnesium.  PT/OT.  Consult with CIR to see if patient is a acceptable candidate.  2.  Hypokalemia/hypomagnesemia Replete.  3.  Left knee swelling Moderate effusion seen on CT left knee with possible mild evulsion fractures.  Mild swelling on examination without increased warmth or tenderness to palpation.   Continue home regimen hydrocodone as needed for pain.  PT/OT.  Follow.  4.  Diabetes mellitus type 2 Continue to hold oral hypoglycemic agents.  Sliding scale insulin.  Follow.  5.  Hypertension Stable.  Continue losartan and amlodipine.  6.  Diabetes mellitus type 2 Check hemoglobin A1c and sliding scale insulin.  7.  Hyperlipidemia Continue statin.  8.  Depression Continue home regimen Cymbalta.  9.  Rheumatoid arthritis Continue leflunomide.   DVT prophylaxis: Heparin Code Status: Full Family Communication: Updated patient and son at bedside. Disposition Plan: CIR versus home with home health hopefully in the next 24 to 48 hours.   Consultants:   None  Procedures:   CT left knee 08/29/2018  CT head and CT C-spine 08/29/2018  CT left knee 08/29/2018  Plain films of the hips and pelvis 08/29/2018  CXR 08/29/2018    Antimicrobials:   None   Subjective: Sitting up in chair.  Denies any chest pain or shortness of breath.  States weakness improving since admission however not at baseline.  Complaining of bilateral lower back pain.  Objective: Vitals:   08/30/18 0737 08/30/18 0818 08/30/18 0923 08/30/18 1142  BP: (!) 154/70  (!) 154/70 (!) 154/70  Pulse: 68   68  Resp: 16   16  Temp: (!) 97.5 F (36.4 C)     TempSrc: Oral     SpO2: 93% 94%    Weight:    72 kg  Height:    5\' 7"  (1.702 m)    Intake/Output Summary (Last 24 hours) at 08/30/2018 1216 Last data filed at 08/30/2018 0900 Gross per 24 hour  Intake 1240 ml  Output 700 ml  Net 540 ml   Filed Weights   08/30/18 1142  Weight: 72 kg    Examination:  General exam: Appears calm and comfortable  Respiratory system: Clear to auscultation. Respiratory effort normal. Cardiovascular system: S1 & S2 heard, RRR. No JVD, murmurs, rubs, gallops or clicks. No pedal edema. Gastrointestinal system: Abdomen is nondistended, soft and nontender. No organomegaly or masses felt. Normal bowel sounds heard.   Patient with bilateral lower lumbar pain. Central nervous system: Alert and oriented. No focal neurological deficits. Extremities: Symmetric 5 x 5 power. Skin: No rashes, lesions or ulcers Psychiatry: Judgement and insight appear normal. Mood & affect appropriate.     Data Reviewed: I have personally reviewed following labs and imaging studies  CBC: Recent Labs  Lab 08/29/18 1230 08/30/18 0256  WBC 9.9 9.5  NEUTROABS 6.9  --   HGB 13.2 12.6  HCT 41.5 38.2  MCV 88.3 86.2  PLT 305 518   Basic Metabolic Panel: Recent Labs  Lab 08/29/18 1430 08/29/18 1711 08/29/18 1910 08/30/18 0256  NA 144  --   --  144  K 4.0 2.8*  --  3.3*  CL 107  --   --  108  CO2 25  --   --  25  GLUCOSE 95  --   --  159*  BUN 11  --   --  9  CREATININE 0.69  --   --  0.67  CALCIUM 8.8*  --   --  8.5*  MG 1.5*  --   --  1.7  PHOS  --   --  2.6  --    GFR: Estimated  Creatinine Clearance: 58.2 mL/min (by C-G formula based on SCr of 0.67 mg/dL). Liver Function Tests: Recent Labs  Lab 08/29/18 1430  AST 31  ALT 15  ALKPHOS 82  BILITOT 1.2  PROT 7.1  ALBUMIN 3.4*   No results for input(s): LIPASE, AMYLASE in the last 168 hours. No results for input(s): AMMONIA in the last 168 hours. Coagulation Profile: No results for input(s): INR, PROTIME in the last 168 hours. Cardiac Enzymes: Recent Labs  Lab 08/29/18 1430  CKTOTAL 401*   BNP (last 3 results) No results for input(s): PROBNP in the last 8760 hours. HbA1C: Recent Labs    08/29/18 1858  HGBA1C 6.3*   CBG: Recent Labs  Lab 08/29/18 1051 08/29/18 2152 08/30/18 0735 08/30/18 1133  GLUCAP 177* 226* 112* 231*   Lipid Profile: No results for input(s): CHOL, HDL, LDLCALC, TRIG, CHOLHDL, LDLDIRECT in the last 72 hours. Thyroid Function Tests: Recent Labs    08/29/18 2033  TSH 0.457   Anemia Panel: No results for input(s): VITAMINB12, FOLATE, FERRITIN, TIBC, IRON, RETICCTPCT in the last 72 hours. Sepsis Labs: Recent Labs   Lab 08/29/18 1257 08/29/18 1711  LATICACIDVEN 2.4* 2.0*    Recent Results (from the past 240 hour(s))  Urine culture     Status: None   Collection Time: 08/29/18 11:59 AM   Specimen: Urine, Random  Result Value Ref Range Status   Specimen Description URINE, RANDOM  Final   Special Requests NONE  Final   Culture   Final    NO GROWTH Performed at [facility], [place] [address] [place], [place] [address]    Report Status 08/30/2018 FINAL  Final  SARS Coronavirus 2 [facility] order, Performed in [facility] hospital lab) Nasopharyngeal Nasopharyngeal Swab     Status: None   Collection Time: 08/29/18  7:30 PM   Specimen: Nasopharyngeal Swab  Result Value Ref Range Status   SARS Coronavirus 2 NEGATIVE NEGATIVE Final    Comment: (NOTE) If result is NEGATIVE SARS-CoV-2 target nucleic acids are NOT DETECTED. The SARS-CoV-2 RNA is generally detectable in upper and lower  respiratory specimens during the acute phase of infection. The lowest  concentration of SARS-CoV-2 viral copies this assay can detect is 250  copies / mL. A negative result does not preclude SARS-CoV-2 infection  and should not be used as the sole basis for treatment or other  patient management decisions.  A negative result may occur with  improper specimen collection / handling, submission of specimen other  than nasopharyngeal swab, presence of viral mutation(s) within the  areas targeted by this assay, and inadequate number of viral copies  (<250 copies / mL). A negative result must be combined with clinical  observations, patient history, and epidemiological information. If result is POSITIVE SARS-CoV-2 target nucleic acids are DETECTED. The SARS-CoV-2 RNA is generally detectable in upper and lower  respiratory specimens dur ing the acute phase of infection.  Positive  results are indicative of active infection with SARS-CoV-2.  Clinical  correlation with patient history and other diagnostic information is   necessary to determine patient infection status.  Positive results do  not rule out bacterial infection or co-infection with other viruses. If result is PRESUMPTIVE POSTIVE SARS-CoV-2 nucleic acids MAY BE PRESENT.   A presumptive positive result was obtained on the submitted specimen  and confirmed on repeat testing.  While 2019 novel coronavirus  (SARS-CoV-2) nucleic acids may be present in the submitted sample  additional confirmatory testing may be necessary  for epidemiological  and / or clinical management purposes  to differentiate between  SARS-CoV-2 and other Sarbecovirus currently known to infect humans.  If clinically indicated additional testing with an alternate test  methodology [id number]) is advised. The SARS-CoV-2 RNA is generally  detectable in upper and lower respiratory sp ecimens during the acute  phase of infection. The expected result is Negative. Fact Sheet for Patients:  StrictlyIdeas.no Fact Sheet for Healthcare Providers: BankingDealers.co.za This test is not yet approved or cleared by the [place] FDA and has been authorized for detection and/or diagnosis of SARS-CoV-2 by FDA under an Emergency Use Authorization (EUA).  This EUA will remain in effect (meaning this test can be used) for the duration of the COVID-19 declaration under Section 564(b)(1) of the Act, 21 U.S.C. section 360bbb-3(b)(1), unless the authorization is terminated or revoked sooner. Performed at [facility], [place] [address] [place], [place] [address]   MRSA PCR Screening     Status: None   Collection Time: 08/29/18  9:19 PM   Specimen: Nasal Mucosa; Nasopharyngeal  Result Value Ref Range Status   MRSA by PCR NEGATIVE NEGATIVE Final    Comment:        The GeneXpert MRSA Assay (FDA approved for NASAL specimens only), is one component of a comprehensive MRSA colonization surveillance program. It is not intended to diagnose  MRSA infection nor to guide or monitor treatment for MRSA infections. Performed at [facility], [place] [address] [place], [place] [address]          Radiology Studies: Dg Lumbar Spine Complete  Result Date: 08/29/2018 CLINICAL DATA:  Fall, low back pain EXAM: LUMBAR SPINE - COMPLETE 4+ VIEW COMPARISON:  02/22/2014 FINDINGS: Five lumbar type vertebral segments. No acute fracture or static subluxation. Minimal grade 1 anterolisthesis L4 on L5. Intervertebral disc spaces are relatively preserved. Vertebral body heights are maintained. Lower lumbar facet arthrosis. IMPRESSION: Mild lumbar spondylosis.  No acute findings. Electronically Signed   By: Davina Poke M.D.   On: 08/29/2018 13:51   Ct Head Wo Contrast  Result Date: 08/29/2018 CLINICAL DATA:  Generalized body weakness today. No injury. History of rheumatoid arthritis. EXAM: CT HEAD WITHOUT CONTRAST CT CERVICAL SPINE WITHOUT CONTRAST TECHNIQUE: Multidetector CT imaging of the head and cervical spine was performed following the standard protocol without intravenous contrast. Multiplanar CT image reconstructions of the cervical spine were also generated. COMPARISON:  12/11/2012 FINDINGS: CT HEAD FINDINGS Brain: No evidence of acute infarction, hemorrhage, hydrocephalus, extra-axial collection or mass lesion/mass effect. There is ventricular and sulcal enlargement reflecting mild diffuse atrophy. Patchy areas of white matter hypoattenuation noted consistent with moderate chronic microvascular ischemic change. 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 Vascular: No hyperdense vessel or unexpected calcification. Skull: Normal. Negative for fracture or focal lesion. Sinuses/Orbits: Visualized globes and orbits are unremarkable. The visualized sinuses and mastoid air cells are clear. Other: None. CT CERVICAL SPINE FINDINGS Alignment: Mild reversal of the cervical lordosis, apex at C3-C4, which appears, at least in part, positional. No  spondylolisthesis/subluxation. Skull base and vertebrae: No acute fracture. No primary bone lesion or focal pathologic process. There is incomplete fusion of the right lamina of C1, stable from the previous CT scan dated 08/04/2012. Soft tissues and spinal canal: No prevertebral fluid or swelling. No visible canal hematoma. Disc levels: Moderate loss of disc height at C3-C4. Mild loss of disc height at C4-C5 and C5-C6. Remaining discs are well preserved in height. No convincing disc herniation. Upper chest: No  acute findings. Changes of emphysema at the lung apices. Other: None. IMPRESSION: HEAD CT 1. No acute intracranial abnormalities. 2. Atrophy and chronic microvascular ischemic change. 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 CERVICAL CT 1. No fracture or acute finding. Electronically Signed   By: Lajean Manes M.D.   On: 08/29/2018 14:34   Ct Cervical Spine Wo Contrast  Result Date: 08/29/2018 CLINICAL DATA:  Generalized body weakness today. No injury. History of rheumatoid arthritis. EXAM: CT HEAD WITHOUT CONTRAST CT CERVICAL SPINE WITHOUT CONTRAST TECHNIQUE: Multidetector CT imaging of the head and cervical spine was performed following the standard protocol without intravenous contrast. Multiplanar CT image reconstructions of the cervical spine were also generated. COMPARISON:  12/11/2012 FINDINGS: CT HEAD FINDINGS Brain: No evidence of acute infarction, hemorrhage, hydrocephalus, extra-axial collection or mass lesion/mass effect. There is ventricular and sulcal enlargement reflecting mild diffuse atrophy. Patchy areas of white matter hypoattenuation noted consistent with moderate chronic microvascular ischemic change. 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 Vascular: No hyperdense vessel or unexpected calcification. Skull: Normal. Negative for fracture or focal lesion. Sinuses/Orbits: Visualized globes and orbits are unremarkable. The visualized sinuses and mastoid air cells are clear. Other: None. CT CERVICAL  SPINE FINDINGS Alignment: Mild reversal of the cervical lordosis, apex at C3-C4, which appears, at least in part, positional. No spondylolisthesis/subluxation. Skull base and vertebrae: No acute fracture. No primary bone lesion or focal pathologic process. There is incomplete fusion of the right lamina of C1, stable from the previous CT scan dated 08/04/2012. Soft tissues and spinal canal: No prevertebral fluid or swelling. No visible canal hematoma. Disc levels: Moderate loss of disc height at C3-C4. Mild loss of disc height at C4-C5 and C5-C6. Remaining discs are well preserved in height. No convincing disc herniation. Upper chest: No acute findings. Changes of emphysema at the lung apices. Other: None. IMPRESSION: HEAD CT 1. No acute intracranial abnormalities. 2. Atrophy and chronic microvascular ischemic change. 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 CERVICAL CT 1. No fracture or acute finding. Electronically Signed   By: Lajean Manes M.D.   On: 08/29/2018 14:34   Ct Knee Left Wo Contrast  Result Date: 08/29/2018 CLINICAL DATA:  Left knee fracture question EXAM: CT OF THE left KNEE WITHOUT CONTRAST TECHNIQUE: Multidetector CT imaging of the left knee was performed according to the standard protocol. Multiplanar CT image reconstructions were also generated. COMPARISON:  August 29, 2018 FINDINGS: Bones/Joint/Cartilage There are small osseous fragments seen from the medial tibial spine at the intercondylar notch best seen on series 7, image 56 which could represent small acute fracture fragments. No other acute fracture or dislocation is seen. There is advanced tricompartmental osteoarthritis, most notable in the lateral compartment with joint space loss, subchondral sclerosis and cystic changes. There is a fragmented enthesophyte seen at the superior lateral patellar pole. There also more posterior well corticated ossicles seen posteriorly likely loose bodies, best seen on series 7, image 64. Ligaments  Suboptimally assessed by CT. There is heterogeneous density seen within the region of the ACL. Muscles and Tendons The patellar and quadriceps tendon are intact. There is mild fatty atrophy noted within the muscles surrounding the knee. Soft tissues A moderate knee joint effusion is seen with synovial thickening. There is soft tissue swelling seen predominantly along the lateral aspect of the knee. Scattered vascular calcifications are noted. IMPRESSION: 1. Small osseous fragments seen adjacent to the medial tibial spine at the intercondylar notch likely represent mildly displaced avulsion fractures. 2. Moderate knee joint effusion. 3. Heterogeneous  signal seen within the region of the ACL, suboptimally evaluated by CT. If further evaluation is required would recommend MRI 4. Loose bodies seen posterior to the medial tibia. Electronically Signed   By: Prudencio Pair M.D.   On: 08/29/2018 16:43   Dg Chest Portable 1 View  Result Date: 08/29/2018 CLINICAL DATA:  Weakness EXAM: PORTABLE CHEST 1 VIEW COMPARISON:  December 27, 2016 FINDINGS: There is slight scarring in the left mid lung. There is no edema or consolidation. Heart is borderline enlarged with pulmonary vascularity normal. There is aortic atherosclerosis. No adenopathy. No bone lesions. IMPRESSION: Minimal scarring left mid lung. No edema or consolidation. Heart borderline enlarged. Aortic Atherosclerosis (ICD10-I70.0). Electronically Signed   By: Lowella Grip III M.D.   On: 08/29/2018 12:49   Dg Knee Complete 4 Views Left  Result Date: 08/29/2018 CLINICAL DATA:  Left knee pain and swelling after fall EXAM: LEFT KNEE - COMPLETE 4+ VIEW COMPARISON:  08/05/2012 FINDINGS: There is a moderate-sized left knee joint effusion. Suggestion of a fat fluid level which would indicate the presence of a lipohemarthrosis. No discrete fracture line is identified. There are tricompartmental degenerative changes. Scattered vascular calcifications. IMPRESSION:  Findings suggestive of a lipohemarthrosis. No discrete fracture line is identified. In the setting of trauma, a lipohemarthrosis suggests the presence of a radiographically occult fracture. Further evaluation with CT is recommended. Electronically Signed   By: Davina Poke M.D.   On: 08/29/2018 13:55   Dg Hips Bilat W Or Wo Pelvis 3-4 Views  Result Date: 08/29/2018 CLINICAL DATA:  Weakness. Additional history: Patient states she fell from bed this morning, pain in lower back bilateral hips and left knee. EXAM: DG HIP (WITH OR WITHOUT PELVIS) 3-4V BILAT COMPARISON:  Abdominal radiographs 09/08/2017, CT abdomen/pelvis 04/07/2016 FINDINGS: There is no evidence of hip fracture or dislocation. Mild bilateral hip osteoarthrosis. Degenerative change of the visualized lower lumbar spine and pubic symphysis. Redemonstrated bowel anastomosis staples projecting over the pelvis. Overlying artifacts from EKG leads. IMPRESSION: No evidence of hip fracture or dislocation. Mild bilateral hip osteoarthrosis. Electronically Signed   By: Kellie Simmering   On: 08/29/2018 14:05        Scheduled Meds:  amLODipine  10 mg Oral Daily   aspirin EC  81 mg Oral Daily   DULoxetine  60 mg Oral BID   fluticasone furoate-vilanterol  1 puff Inhalation Daily   heparin  5,000 Units Subcutaneous Q8H   insulin aspart  0-9 Units Subcutaneous TID WC   leflunomide  20 mg Oral Daily   losartan  100 mg Oral Daily   potassium chloride  40 mEq Oral Once   pravastatin  10 mg Oral q1800   sodium chloride flush  3 mL Intravenous Q12H   Continuous Infusions:   LOS: 0 days    Time spent: 35 minutes    Irine Seal, MD [facility]  If 7PM-7AM, please contact night-coverage [email or web address] 08/30/2018, 12:16 PM

## 2018-08-30 NOTE — Evaluation (Signed)
Occupational Therapy Evaluation Patient Details Name: Leslie Gamble MRN: [id number] DOB: 1941-01-13 Today's Date: 08/30/2018    History of Present Illness 78 yo female with onset of sliding off bed was brought to hosp, noted R knee effusion and mildly displaced avulsion fractures to R L knee on medial tibial spine, intercondylar notch.  Pt complains of lumbar spine pain but has spondylosis.  PMHx:  AKI, OA, asthma, depression, DM, anxiety, HTN, aortic atherosclerosis, R frontal infarct, DDD cervical spine, anterolisthesis L4 on L5, with lumbar spondylosis, RA   Clinical Impression   Pt with decline in function and safety with ADLs and ADL mobility with impaired strength, balance and endurance. Pt lives at home with her son and was independent with ADLs/selfcare and light meal prep and used a RW. Pt now required mod A with LB ADLs and functional mobility using RW. Pt with cognitive deficits (memory) as well with poor insight into her impairments and home safety. Pt's son present during eval. Pt would benefit from acute OT services to address impairments to maximize level of function and safety    Follow Up Recommendations  CIR    Equipment Recommendations  Other (comment);Tub/shower bench(TBD at next venue of care)    Recommendations for Other Services Rehab consult     Precautions / Restrictions Precautions Precautions: Fall Restrictions Weight Bearing Restrictions: No      Mobility Bed Mobility Overal bed mobility: Needs Assistance Bed Mobility: Supine to Sit     Supine to sit: Mod assist     General bed mobility comments: pt in recliner upon arrival  Transfers Overall transfer level: Needs assistance Equipment used: Rolling walker (2 wheeled);1 person hand held assist Transfers: Sit to/from Stand Sit to Stand: Mod assist         General transfer comment: Mod to power up and mod to assist with controlling initial balance    Balance Overall balance assessment:  Needs assistance;History of Falls Sitting-balance support: Feet supported Sitting balance-Leahy Scale: Fair     Standing balance support: Bilateral upper extremity supported;During functional activity Standing balance-Leahy Scale: Poor                             ADL either performed or assessed with clinical judgement   ADL Overall ADL's : Needs assistance/impaired Eating/Feeding: Independent;Sitting   Grooming: Wash/dry hands;Wash/dry face;Standing;Min guard   Upper Body Bathing: Set up;Supervision/ safety;Sitting   Lower Body Bathing: Moderate assistance;Cueing for safety   Upper Body Dressing : Set up;Supervision/safety;Sitting   Lower Body Dressing: Moderate assistance;Cueing for safety   Toilet Transfer: Moderate assistance;RW;Ambulation;BSC;Cueing for safety;Cueing for sequencing   Toileting- Clothing Manipulation and Hygiene: Minimal assistance;Sit to/from stand       Functional mobility during ADLs: Moderate assistance;Rolling walker;Cueing for sequencing;Cueing for safety       Vision Patient Visual Report: No change from baseline       Perception     Praxis      Pertinent Vitals/Pain Pain Assessment: No/denies pain     Hand Dominance Right   Extremity/Trunk Assessment Upper Extremity Assessment Upper Extremity Assessment: Generalized weakness   Lower Extremity Assessment Lower Extremity Assessment: Defer to PT evaluation RLE Deficits / Details: 4- R knee strength to extend, 3+ to flex RLE Coordination: decreased gross motor   Cervical / Trunk Assessment Cervical / Trunk Assessment: Kyphotic   Communication Communication Communication: No difficulties   Cognition Arousal/Alertness: Awake/alert Behavior During Therapy: WFL for tasks assessed/performed Overall  Cognitive Status: Impaired/Different from baseline Area of Impairment: Memory;Safety/judgement;Awareness;Problem solving                 Orientation Level:  Situation   Memory: Decreased short-term memory   Safety/Judgement: Decreased awareness of safety;Decreased awareness of deficits Awareness: Intellectual Problem Solving: Requires verbal cues;Requires tactile cues     General Comments  pt is up to walk with help but is not comfortable with the activity, and is not realistic with this for home as she quickly forgets her performance    Exercises Exercises: Other exercises(4- R knee ext and 3+ flexion)   Shoulder Instructions      Home Living Family/patient expects to be discharged to:: Private residence Living Arrangements: Children Available Help at Discharge: Family;Available PRN/intermittently Type of Home: House Home Access: Stairs to enter [employer] of Steps: 4 Entrance Stairs-Rails: Right;Left;Can reach both Home Layout: One level     Bathroom Shower/Tub: Teacher, early years/pre: Standard     Home Equipment: Cane - single point;Shower seat;Grab bars - tub/shower          Prior Functioning/Environment Level of Independence: Independent with assistive device(s);Needs assistance  Gait / Transfers Assistance Needed: pt reporst that she uses a RW, but was unable to specify which type. 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 ADL's / Homemaking Assistance Needed: Pt reports that she was independent with ADLs/selfcare and light meal prep   Comments: per son pt is not doing a lot of housework        OT Problem List: Decreased strength;Impaired balance (sitting and/or standing);Decreased cognition;Decreased safety awareness;Decreased activity tolerance;Decreased knowledge of use of DME or AE      OT Treatment/Interventions: Self-care/ADL training;DME and/or AE instruction;Therapeutic activities;Patient/family education;Therapeutic exercise    OT Goals(Current goals can be found in the care plan section) Acute Rehab OT Goals Patient Stated Goal: to go directly home OT Goal Formulation: With  patient/family Time For Goal Achievement: 09/06/18 Potential to Achieve Goals: Good ADL Goals Pt Will Perform Grooming: with min guard assist;with supervision;with set-up;standing Pt Will Perform Upper Body Bathing: with supervision;with set-up;sitting Pt Will Perform Lower Body Bathing: with min assist Pt Will Perform Upper Body Dressing: with supervision;with set-up;sitting Pt Will Perform Lower Body Dressing: with min assist Pt Will Transfer to Toilet: with min assist;with min guard assist;ambulating Pt Will Perform Toileting - Clothing Manipulation and hygiene: with min guard assist;sit to/from stand Pt Will Perform Tub/Shower Transfer: with min assist;ambulating;tub bench;3 in 1;grab bars;rolling walker  OT Frequency: Min 2X/week   Barriers to D/C: Decreased caregiver support          Co-evaluation              AM-PAC OT "6 Clicks" Daily Activity     Outcome Measure Help from another person eating meals?: None Help from another person taking care of personal grooming?: A Little Help from another person toileting, which includes using toliet, bedpan, or urinal?: A Lot Help from another person bathing (including washing, rinsing, drying)?: A Lot Help from another person to put on and taking off regular upper body clothing?: A Little Help from another person to put on and taking off regular lower body clothing?: A Lot 6 Click Score: 16   End of Session Equipment Utilized During Treatment: Gait belt;Rolling walker;Other (comment)(BSC)  Activity Tolerance: Patient tolerated treatment well Patient left: in chair;with call bell/phone within reach;with chair alarm set;with family/visitor present  OT Visit Diagnosis: Unsteadiness on feet (R26.81);Other abnormalities of gait  and mobility (R26.89);Muscle weakness (generalized) (M62.81);History of falling (Z91.81);Other symptoms and signs involving cognitive function                Time: 1833-5825 OT Time Calculation (min): 25  min Charges:  OT General Charges $OT Visit: 1 Visit OT Evaluation $OT Eval Moderate Complexity: 1 Mod    Britt Bottom 08/30/2018, 3:13 PM

## 2018-08-30 NOTE — Progress Notes (Signed)
Thank you for consult on [name]. Chart reviewed--will await PT/OT evaluations to help determine appropriate rehab venue.

## 2018-08-31 LAB — CBC
HCT: 36.9 % (ref 36.0–46.0)
Hemoglobin: 12.1 g/dL (ref 12.0–15.0)
MCH: 28.3 pg (ref 26.0–34.0)
MCHC: 32.8 g/dL (ref 30.0–36.0)
MCV: 86.2 fL (ref 80.0–100.0)
Platelets: 327 10*3/uL (ref 150–400)
RBC: 4.28 MIL/uL (ref 3.87–5.11)
RDW: 14.3 % (ref 11.5–15.5)
WBC: 9.2 10*3/uL (ref 4.0–10.5)
nRBC: 0 % (ref 0.0–0.2)

## 2018-08-31 LAB — BASIC METABOLIC PANEL
Anion gap: 11 (ref 5–15)
BUN: 15 mg/dL (ref 8–23)
CO2: 24 mmol/L (ref 22–32)
Calcium: 8.5 mg/dL — ABNORMAL LOW (ref 8.9–10.3)
Chloride: 105 mmol/L (ref 98–111)
Creatinine, Ser: 0.8 mg/dL (ref 0.44–1.00)
GFR calc Af Amer: >60 mL/min (ref >60)
GFR calc non Af Amer: >60 mL/min (ref >60)
Glucose, Bld: 237 mg/dL — ABNORMAL HIGH (ref 70–99)
Potassium: 3.4 mmol/L — ABNORMAL LOW (ref 3.5–5.1)
Sodium: 140 mmol/L (ref 135–145)

## 2018-08-31 LAB — GLUCOSE, CAPILLARY
Glucose-Capillary: 196 mg/dL — ABNORMAL HIGH (ref 70–99)
Glucose-Capillary: 282 mg/dL — ABNORMAL HIGH (ref 70–99)
Glucose-Capillary: 217 mg/dL — ABNORMAL HIGH (ref 70–99)
Glucose-Capillary: 254 mg/dL — ABNORMAL HIGH (ref 70–99)

## 2018-08-31 LAB — MAGNESIUM: Magnesium: 1.7 mg/dL (ref 1.7–2.4)

## 2018-08-31 NOTE — Progress Notes (Signed)
Subjective:  Leslie Gamble presents to clinic today with h/o diabetes and RA with cc of  painful, thick, discolored, elongated toenails 1-5 b/l that become tender and cannot cut because of thickness.  Pain is aggravated when wearing enclosed shoe gear.   Allergies  Allergen Reactions  . Avelox [Moxifloxacin Hcl In Nacl] Itching and Swelling  . Cephalexin     Nose swelling  . Ciprofloxacin Swelling  . Clarithromycin Swelling  . Clindamycin Hcl Swelling  . Codeine     Lip swelling  . Diflucan [Fluconazole] Diarrhea  . Lidocaine Swelling  . Metronidazole Swelling  . Nitrofurantoin Monohyd Macro Swelling  . Other     Magic mouthwash   . Penicillins Swelling  . Shrimp [Shellfish Allergy] Swelling    In nose and lips  . Sitagliptin Swelling  . Sulfa Antibiotics Swelling  . Tetracyclines & Related Swelling  . Tramadol Hcl     REACTION: facial swelling, dyspnea     Objective:  Physical Examination:  Vascular Examination: Capillary refill time <3 seconds x 10 digits.  Palpable DP/PT pulses b/l.  Digital hair absent b/l.  No edema noted b/l.  Skin temperature gradient WNL b/l.  Dermatological Examination: Skin with normal turgor, texture and tone b/l.  No open wounds b/l.  No interdigital macerations noted b/l.  Elongated, thick, discolored brittle toenails with subungual debris and pain on dorsal palpation of nailbeds 1-5 b/l.  Musculoskeletal Examination: Muscle strength 5/5 to all muscle groups b/l.  No pain, crepitus or joint discomfort with active/passive ROM.  Neurological Examination: Sensation diminished 5/5 b/l with 10 gram monofilament.  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 Toenails 1-5 b/l were debrided in length and girth without iatrogenic laceration. 2.  Continue soft, supportive shoe gear daily. 3.  Report any pedal injuries to medical professional. 4.  Follow up 3 months. 5.  Patient/POA to call should  there be a question/concern in there interim.

## 2018-08-31 NOTE — Plan of Care (Signed)
  Problem: Pain Managment: Goal: General experience of comfort will improve Outcome: Progressing   Problem: Safety: Goal: Ability to remain free from injury will improve Outcome: Progressing   Problem: Skin Integrity: Goal: Risk for impaired skin integrity will decrease Outcome: Progressing   

## 2018-08-31 NOTE — Progress Notes (Signed)
Inpatient Diabetes Program Recommendations  AACE/ADA: New Consensus Statement on Inpatient Glycemic Control (2015)  Target Ranges:  Prepandial:   less than 140 mg/dL      Peak postprandial:   less than 180 mg/dL (1-2 hours)      Critically ill patients:  140 - 180 mg/dL   Lab Results  Component Value Date   GLUCAP 282 (H) 08/31/2018   HGBA1C 6.3 (H) 08/29/2018    Review of Glycemic Control  Diabetes history: DM2 Outpatient Diabetes medications: Tresiba 26 units QD, Novolog 0-9 units tidwc, metformin 1000 mg Q lunchtime Current orders for Inpatient glycemic control: Novolog 0-9 units tidwc  HgbA1C - 6.3% - good glycemic control Blood sugars 196-282 mg/dL. Needs 1/2 basal insulin. Eating 100%. May need small amount of meal coverage insulin  Inpatient Diabetes Program Recommendations:     Add Lantus 13 units Q24H Add Novolog 3 units tidwc for meal coverage insulin if pt eats > 50% meal.  Will follow glucose trends.  Thank you. Lorenda Peck, RD, LDN, CDE Inpatient Diabetes Coordinator [phone]

## 2018-08-31 NOTE — Progress Notes (Signed)
Inpatient Rehab Admissions:  Inpatient Rehab Consult received.  I met with pt and her daugther at the bedside for rehabilitation assessment. Pt unsure if she needs rehab as she feels better but states she has not had a chance to move around a lot to know. Pt stood and ambulated with Min G short distance with AC present. I have contacted PT for reassessment to see if patient still meets functional guidelines.  Will follow up after reassessment.   Jhonnie Garner, OTR/L  Rehab Admissions Coordinator  [phone] 08/31/2018 12:52 PM

## 2018-08-31 NOTE — Progress Notes (Signed)
PROGRESS NOTE    Leslie Gamble  HCW:[id number] DOB: [birth year] DOA: 08/29/2018 PCP: Wenda Low, MD    Brief Narrative: HPI per Dr. Sula Rumple is a 78 y.o. female with medical history significant for type 2 diabetes, hypertension, hyperlipidemia, asthma, rheumatoid arthritis, depression, generalized anxiety disorder who presented to the ED for evaluation of generalized weakness.  Patient states she was in her usual state of health until early this morning around 1 AM when she attempted to get out of bed to go to the bathroom but felt weak in both of her legs.  She had difficulty standing on her own power and states she "slid" gently off her bed onto the floor without fall or injury.  She had no loss of consciousness, numbness, tingling, loss of bowel/bladder control, chest pain, palpitations, dyspnea, cough, diarrhea, or constipation.  She reports nausea without emesis.  She denies any back pain. Patient states she normally ambulates with the use of a cane when outside the house but does not use assistive devices while in the home.  She lives with her son who called EMS services as she had continued weakness in her legs. She reports a history of rheumatoid arthritis for which she takes leflunomide.  Her RA normally affects her joints of her hands and previously the knees however improved after beginning antirheumatic therapy. In ED: Initial vitals showed BP 152/81, pulse 82, RR 11, temp 98.2 Fahrenheit, SPO2 96% on room air. Labs notable for potassium 2.8, magnesium 1.5, sodium 144, bicarb 25, BUN 11, creatinine 0.69, CK 401, WBC 9.9, hemoglobin 13.2, platelets 305,000, UDS positive for benzodiazepines (expected as she takes alprazolam at home).  UA was negative for UTI.  High-sensitivity troponin I was 13 and 15. Portable chest x-ray showed minimal left midlung scarring without focal consolidation or edema. X-ray lumbar spine showed mild lumbar spondylosis without acute findings. Bilateral  hip x-ray showed mild bilateral hip OA without evidence of hip fracture or dislocation. Left knee x-ray showed findings suggestive of a lipohemarthrosis without discrete fracture line. CT left knee showed small osseous fragments adjacent to the medial tibial spine and intercondylar notch felt to represent mildly displaced avulsion fractures, moderate knee joint effusion, and suboptimally evaluated heterogeneous signal within the region of the ACL. CT head and cervical spine without contrast were negative for acute intracranial abnormalities or cervical fracture or acute finding.  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 Patient was given 1 L normal saline, IV magnesium, and IV K 10 mEq x 3 runs.  The hospitalist service was consulted for further evaluation and management.   Assessment & Plan:   Principal Problem:   Generalized weakness Active Problems:   Diabetes mellitus, type 2 (HCC)   Depression   Essential hypertension   Asthma   Hypomagnesemia   Hypokalemia   Pain and swelling of left knee   Generalized anxiety disorder   Generalized weakness/ambulatory dysfunction POA, multifactorial In the setting of dehydration, electrolyte abnormalities, arthritis, poor exercise tolerance, deconditioning.  No overt signs or symptoms for fracture, neurological impingement causing weakness. Back pain reproducible - likely musculoskeletal given recent falls -Patient denies any bladder or stool incontinence.   -Plain films of the back and bilateral hip are negative for any acute findings.  -Electrolyte abnormalities resolving -Concurrent deconditioning, known lumbar/rheumatoid disease.   -PT/OT.  Consult with CIR to see if patient is a acceptable candidate.  Lactic acidosis without anion gap, resolved Possibly secondary  to poor po intake/dehydration Resolved with fluids well as increase p.o. intake  Hypokalemia/hypomagnesemia,  resolved Replete.  Left knee swelling  Concurrent Rheumatoid arthritis Moderate effusion seen on CT left knee with possible mild evulsion fractures.  Mild swelling on examination without increased warmth or tenderness to palpation.   Continue home regimen hydrocodone as needed for pain.   PT/OT continue to follow, we appreciate insight and recommendations  Continue on home dose leflunomide.  Non insulin dependent- Diabetes mellitus type 2 Continue to hold oral hypoglycemic agents.  Sliding scale insulin.  Follow.  Hypertension, essential Stable.  Continue losartan and amlodipine.  Hyperlipidemia Continue statin.  Depression Continue home regimen Cymbalta.  DVT prophylaxis: Heparin Code Status: Full Disposition Plan: CIR versus home with home health hopefully in the next 24 to 48 hours.  Consultants:   None  Imaging:   CT left knee 08/29/2018  CT head and CT C-spine 08/29/2018  CT left knee 08/29/2018  Plain films of the hips and pelvis 08/29/2018  CXR 08/29/2018    Antimicrobials:   None   Subjective: No acute issues or events overnight, patient sitting up in bed appears well rested, declines chest pain, shortness of breath, headache, fever, chills, nausea, vomiting, diarrhea, constipation.  Objective: Vitals:   08/30/18 1454 08/30/18 1948 08/31/18 0335 08/31/18 0600  BP: 126/75 132/82 (!) 141/77   Pulse: 68 79 79   Resp: 18 16 16    Temp: 98.1 F (36.7 C) 97.9 F (36.6 C) 98.2 F (36.8 C)   TempSrc: Oral Oral Oral   SpO2: 100% 96% 100%   Weight:    73 kg  Height:        Intake/Output Summary (Last 24 hours) at 08/31/2018 0826 Last data filed at 08/31/2018 0500 Gross per 24 hour  Intake 1200 ml  Output 1600 ml  Net -400 ml   Filed Weights   08/30/18 1142 08/31/18 0600  Weight: 72 kg 73 kg    Examination:  General exam: Appears calm and comfortable  Respiratory system: Clear to auscultation. Respiratory effort normal. Cardiovascular system:  S1 & S2 heard, RRR. No JVD, murmurs, rubs, gallops or clicks. No pedal edema. Gastrointestinal system: Abdomen is nondistended, soft and nontender. No organomegaly or masses felt. Normal bowel sounds heard.  Patient with bilateral lower lumbar pain. Central nervous system: Alert and oriented. No focal neurological deficits. Extremities: Symmetric 5 x 5 power. Skin: No rashes, lesions or ulcers Psychiatry: Judgement and insight appear normal. Mood & affect appropriate.     Data Reviewed: I have personally reviewed following labs and imaging studies  CBC: Recent Labs  Lab 08/29/18 1230 08/30/18 0256 08/31/18 0351  WBC 9.9 9.5 9.2  NEUTROABS 6.9  --   --   HGB 13.2 12.6 12.1  HCT 41.5 38.2 36.9  MCV 88.3 86.2 86.2  PLT 305 300 893   Basic Metabolic Panel: Recent Labs  Lab 08/29/18 1430 08/29/18 1711 08/29/18 1910 08/30/18 0256 08/31/18 0351  NA 144  --   --  144 140  K 4.0 2.8*  --  3.3* 3.4*  CL 107  --   --  108 105  CO2 25  --   --  25 24  GLUCOSE 95  --   --  159* 237*  BUN 11  --   --  9 15  CREATININE 0.69  --   --  0.67 0.80  CALCIUM 8.8*  --   --  8.5* 8.5*  MG 1.5*  --   --  1.7 1.7  PHOS  --   --  2.6  --   --    GFR: Estimated Creatinine Clearance: 58.2 mL/min (by C-G formula based on SCr of 0.8 mg/dL). Liver Function Tests: Recent Labs  Lab 08/29/18 1430  AST 31  ALT 15  ALKPHOS 82  BILITOT 1.2  PROT 7.1  ALBUMIN 3.4*   Cardiac Enzymes: Recent Labs  Lab 08/29/18 1430  CKTOTAL 401*   HbA1C: Recent Labs    08/29/18 1858  HGBA1C 6.3*   CBG: Recent Labs  Lab 08/30/18 0735 08/30/18 1133 08/30/18 1625 08/30/18 2151 08/31/18 0740  GLUCAP 112* 231* 218* 183* 196*   Thyroid Function Tests: Recent Labs    08/29/18 2033  TSH 0.457     Recent Results (from the past 240 hour(s))  Urine culture     Status: None   Collection Time: 08/29/18 11:59 AM   Specimen: Urine, Random  Result Value Ref Range Status   Specimen Description  URINE, RANDOM  Final   Special Requests NONE  Final   Culture   Final    NO GROWTH Performed at [facility], [place] [address], [place],  [address]    Report Status 08/30/2018 FINAL  Final  SARS Coronavirus 2 [facility] order, Performed in [facility] hospital lab) Nasopharyngeal Nasopharyngeal Swab     Status: None   Collection Time: 08/29/18  7:30 PM   Specimen: Nasopharyngeal Swab  Result Value Ref Range Status   SARS Coronavirus 2 NEGATIVE NEGATIVE Final    Comment: (NOTE) If result is NEGATIVE SARS-CoV-2 target nucleic acids are NOT DETECTED. The SARS-CoV-2 RNA is generally detectable in upper and lower  respiratory specimens during the acute phase of infection. The lowest  concentration of SARS-CoV-2 viral copies this assay can detect is 250  copies / mL. A negative result does not preclude SARS-CoV-2 infection  and should not be used as the sole basis for treatment or other  patient management decisions.  A negative result may occur with  improper specimen collection / handling, submission of specimen other  than nasopharyngeal swab, presence of viral mutation(s) within the  areas targeted by this assay, and inadequate number of viral copies  (<250 copies / mL). A negative result must be combined with clinical  observations, patient history, and epidemiological information. If result is POSITIVE SARS-CoV-2 target nucleic acids are DETECTED. The SARS-CoV-2 RNA is generally detectable in upper and lower  respiratory specimens dur ing the acute phase of infection.  Positive  results are indicative of active infection with SARS-CoV-2.  Clinical  correlation with patient history and other diagnostic information is  necessary to determine patient infection status.  Positive results do  not rule out bacterial infection or co-infection with other viruses. If result is PRESUMPTIVE POSTIVE SARS-CoV-2 nucleic acids MAY BE PRESENT.   A presumptive positive result was  obtained on the submitted specimen  and confirmed on repeat testing.  While 2019 novel coronavirus  (SARS-CoV-2) nucleic acids may be present in the submitted sample  additional confirmatory testing may be necessary for epidemiological  and / or clinical management purposes  to differentiate between  SARS-CoV-2 and other Sarbecovirus currently known to infect humans.  If clinically indicated additional testing with an alternate test  methodology [phone]) is advised. The SARS-CoV-2 RNA is generally  detectable in upper and lower respiratory sp ecimens during the acute  phase of infection. The expected result is Negative. Fact Sheet for Patients:  StrictlyIdeas.no Fact Sheet for  Healthcare Providers: BankingDealers.co.za This test is not yet approved or cleared by the [place] and has been authorized for detection and/or diagnosis of SARS-CoV-2 by FDA under an Emergency Use Authorization (EUA).  This EUA will remain in effect (meaning this test can be used) for the duration of the COVID-19 declaration under Section 564(b)(1) of the Act, 21 U.S.C. section 360bbb-3(b)(1), unless the authorization is terminated or revoked sooner. Performed at [facility], [place] [address], [place], [place] [address]   MRSA PCR Screening     Status: None   Collection Time: 08/29/18  9:19 PM   Specimen: Nasal Mucosa; Nasopharyngeal  Result Value Ref Range Status   MRSA by PCR NEGATIVE NEGATIVE Final    Comment:        The GeneXpert MRSA Assay (FDA approved for NASAL specimens only), is one component of a comprehensive MRSA colonization surveillance program. It is not intended to diagnose MRSA infection nor to guide or monitor treatment for MRSA infections. Performed at [facility], [place] [address] [place], [place] [address]          Radiology Studies: Dg Lumbar Spine Complete  Result Date: 08/29/2018 CLINICAL DATA:  Fall,  low back pain EXAM: LUMBAR SPINE - COMPLETE 4+ VIEW COMPARISON:  02/22/2014 FINDINGS: Five lumbar type vertebral segments. No acute fracture or static subluxation. Minimal grade 1 anterolisthesis L4 on L5. Intervertebral disc spaces are relatively preserved. Vertebral body heights are maintained. Lower lumbar facet arthrosis. IMPRESSION: Mild lumbar spondylosis.  No acute findings. Electronically Signed   By: Davina Poke M.D.   On: 08/29/2018 13:51   Ct Head Wo Contrast  Result Date: 08/29/2018 CLINICAL DATA:  Generalized body weakness today. No injury. History of rheumatoid arthritis. EXAM: CT HEAD WITHOUT CONTRAST CT CERVICAL SPINE WITHOUT CONTRAST TECHNIQUE: Multidetector CT imaging of the head and cervical spine was performed following the standard protocol without intravenous contrast. Multiplanar CT image reconstructions of the cervical spine were also generated. COMPARISON:  12/11/2012 FINDINGS: CT HEAD FINDINGS Brain: No evidence of acute infarction, hemorrhage, hydrocephalus, extra-axial collection or mass lesion/mass effect. There is ventricular and sulcal enlargement reflecting mild diffuse atrophy. Patchy areas of white matter hypoattenuation noted consistent with moderate chronic microvascular ischemic change. 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 Vascular: No hyperdense vessel or unexpected calcification. Skull: Normal. Negative for fracture or focal lesion. Sinuses/Orbits: Visualized globes and orbits are unremarkable. The visualized sinuses and mastoid air cells are clear. Other: None. CT CERVICAL SPINE FINDINGS Alignment: Mild reversal of the cervical lordosis, apex at C3-C4, which appears, at least in part, positional. No spondylolisthesis/subluxation. Skull base and vertebrae: No acute fracture. No primary bone lesion or focal pathologic process. There is incomplete fusion of the right lamina of C1, stable from the previous CT scan dated 08/04/2012. Soft tissues and spinal canal: No  prevertebral fluid or swelling. No visible canal hematoma. Disc levels: Moderate loss of disc height at C3-C4. Mild loss of disc height at C4-C5 and C5-C6. Remaining discs are well preserved in height. No convincing disc herniation. Upper chest: No acute findings. Changes of emphysema at the lung apices. Other: None. IMPRESSION: HEAD CT 1. No acute intracranial abnormalities. 2. Atrophy and chronic microvascular ischemic change. 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 CERVICAL CT 1. No fracture or acute finding. Electronically Signed   By: Lajean Manes M.D.   On: 08/29/2018 14:34   Ct Cervical Spine Wo Contrast  Result Date: 08/29/2018 CLINICAL DATA:  Generalized body weakness today. No  injury. History of rheumatoid arthritis. EXAM: CT HEAD WITHOUT CONTRAST CT CERVICAL SPINE WITHOUT CONTRAST TECHNIQUE: Multidetector CT imaging of the head and cervical spine was performed following the standard protocol without intravenous contrast. Multiplanar CT image reconstructions of the cervical spine were also generated. COMPARISON:  12/11/2012 FINDINGS: CT HEAD FINDINGS Brain: No evidence of acute infarction, hemorrhage, hydrocephalus, extra-axial collection or mass lesion/mass effect. There is ventricular and sulcal enlargement reflecting mild diffuse atrophy. Patchy areas of white matter hypoattenuation noted consistent with moderate chronic microvascular ischemic change. 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 Vascular: No hyperdense vessel or unexpected calcification. Skull: Normal. Negative for fracture or focal lesion. Sinuses/Orbits: Visualized globes and orbits are unremarkable. The visualized sinuses and mastoid air cells are clear. Other: None. CT CERVICAL SPINE FINDINGS Alignment: Mild reversal of the cervical lordosis, apex at C3-C4, which appears, at least in part, positional. No spondylolisthesis/subluxation. Skull base and vertebrae: No acute fracture. No primary bone lesion or focal pathologic process. There is  incomplete fusion of the right lamina of C1, stable from the previous CT scan dated 08/04/2012. Soft tissues and spinal canal: No prevertebral fluid or swelling. No visible canal hematoma. Disc levels: Moderate loss of disc height at C3-C4. Mild loss of disc height at C4-C5 and C5-C6. Remaining discs are well preserved in height. No convincing disc herniation. Upper chest: No acute findings. Changes of emphysema at the lung apices. Other: None. IMPRESSION: HEAD CT 1. No acute intracranial abnormalities. 2. Atrophy and chronic microvascular ischemic change. 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 CERVICAL CT 1. No fracture or acute finding. Electronically Signed   By: Lajean Manes M.D.   On: 08/29/2018 14:34   Ct Knee Left Wo Contrast  Result Date: 08/29/2018 CLINICAL DATA:  Left knee fracture question EXAM: CT OF THE left KNEE WITHOUT CONTRAST TECHNIQUE: Multidetector CT imaging of the left knee was performed according to the standard protocol. Multiplanar CT image reconstructions were also generated. COMPARISON:  August 29, 2018 FINDINGS: Bones/Joint/Cartilage There are small osseous fragments seen from the medial tibial spine at the intercondylar notch best seen on series 7, image 56 which could represent small acute fracture fragments. No other acute fracture or dislocation is seen. There is advanced tricompartmental osteoarthritis, most notable in the lateral compartment with joint space loss, subchondral sclerosis and cystic changes. There is a fragmented enthesophyte seen at the superior lateral patellar pole. There also more posterior well corticated ossicles seen posteriorly likely loose bodies, best seen on series 7, image 64. Ligaments Suboptimally assessed by CT. There is heterogeneous density seen within the region of the ACL. Muscles and Tendons The patellar and quadriceps tendon are intact. There is mild fatty atrophy noted within the muscles surrounding the knee. Soft tissues A moderate knee joint  effusion is seen with synovial thickening. There is soft tissue swelling seen predominantly along the lateral aspect of the knee. Scattered vascular calcifications are noted. IMPRESSION: 1. Small osseous fragments seen adjacent to the medial tibial spine at the intercondylar notch likely represent mildly displaced avulsion fractures. 2. Moderate knee joint effusion. 3. Heterogeneous signal seen within the region of the ACL, suboptimally evaluated by CT. If further evaluation is required would recommend MRI 4. Loose bodies seen posterior to the medial tibia. Electronically Signed   By: Prudencio Pair M.D.   On: 08/29/2018 16:43   Dg Chest Portable 1 View  Result Date: 08/29/2018 CLINICAL DATA:  Weakness EXAM: PORTABLE CHEST 1 VIEW COMPARISON:  December 27, 2016 FINDINGS: There is slight scarring  in the left mid lung. There is no edema or consolidation. Heart is borderline enlarged with pulmonary vascularity normal. There is aortic atherosclerosis. No adenopathy. No bone lesions. IMPRESSION: Minimal scarring left mid lung. No edema or consolidation. Heart borderline enlarged. Aortic Atherosclerosis (ICD10-I70.0). Electronically Signed   By: Lowella Grip III M.D.   On: 08/29/2018 12:49   Dg Knee Complete 4 Views Left  Result Date: 08/29/2018 CLINICAL DATA:  Left knee pain and swelling after fall EXAM: LEFT KNEE - COMPLETE 4+ VIEW COMPARISON:  08/05/2012 FINDINGS: There is a moderate-sized left knee joint effusion. Suggestion of a fat fluid level which would indicate the presence of a lipohemarthrosis. No discrete fracture line is identified. There are tricompartmental degenerative changes. Scattered vascular calcifications. IMPRESSION: Findings suggestive of a lipohemarthrosis. No discrete fracture line is identified. In the setting of trauma, a lipohemarthrosis suggests the presence of a radiographically occult fracture. Further evaluation with CT is recommended. Electronically Signed   By: Davina Poke  M.D.   On: 08/29/2018 13:55   Dg Hips Bilat W Or Wo Pelvis 3-4 Views  Result Date: 08/29/2018 CLINICAL DATA:  Weakness. Additional history: Patient states she fell from bed this morning, pain in lower back bilateral hips and left knee. EXAM: DG HIP (WITH OR WITHOUT PELVIS) 3-4V BILAT COMPARISON:  Abdominal radiographs 09/08/2017, CT abdomen/pelvis 04/07/2016 FINDINGS: There is no evidence of hip fracture or dislocation. Mild bilateral hip osteoarthrosis. Degenerative change of the visualized lower lumbar spine and pubic symphysis. Redemonstrated bowel anastomosis staples projecting over the pelvis. Overlying artifacts from EKG leads. IMPRESSION: No evidence of hip fracture or dislocation. Mild bilateral hip osteoarthrosis. Electronically Signed   By: Kellie Simmering   On: 08/29/2018 14:05        Scheduled Meds:  amLODipine  10 mg Oral Daily   aspirin EC  81 mg Oral Daily   DULoxetine  60 mg Oral BID   fluticasone furoate-vilanterol  1 puff Inhalation Daily   heparin  5,000 Units Subcutaneous Q8H   insulin aspart  0-9 Units Subcutaneous TID WC   leflunomide  20 mg Oral Daily   losartan  100 mg Oral Daily   pravastatin  10 mg Oral q1800   sodium chloride flush  3 mL Intravenous Q12H   Continuous Infusions:   LOS: 1 day   Time spent: 35 minutes  Little Ishikawa, DO [facility]  If 7PM-7AM, please contact night-coverage [email or web address] 08/31/2018, 8:26 AM

## 2018-08-31 NOTE — Progress Notes (Signed)
Patient ate a late dinner at 1915. CBG was 254 at 2041. Pt voices no complaints. Will continue to monitor.

## 2018-08-31 NOTE — Progress Notes (Signed)
Physical Therapy Treatment Patient Details Name: Leslie Gamble MRN: [id number] DOB: 13-Feb-1940 Today's Date: 08/31/2018    History of Present Illness 78 yo female with onset of sliding off bed was brought to hosp, noted R knee effusion and mildly displaced avulsion fractures to R L knee on medial tibial spine, intercondylar notch.  Pt complains of lumbar spine pain but has spondylosis.  PMHx:  AKI, OA, asthma, depression, DM, anxiety, HTN, aortic atherosclerosis, R frontal infarct, DDD cervical spine, anterolisthesis L4 on L5, with lumbar spondylosis, RA    PT Comments    Pt is up to walk with PT but continues to have significant safety issues of hand placement to stand, use of walker with turns and limited insight into her lack of awareness of safety.  Daughter is in to see pt and able to observe what is a struggle for pt as well as what is better.  PT continues to recommend rehab to give intensified therapy and safety training to this pt, who has made great progress in a short time and should be able to complete therapy there in a timely way.  Follow acutely for these same concerns.   Follow Up Recommendations  CIR     Equipment Recommendations  None recommended by PT    Recommendations for Other Services Rehab consult     Precautions / Restrictions Precautions Precautions: Fall Restrictions Weight Bearing Restrictions: No    Mobility  Bed Mobility               General bed mobility comments: up in chair when PT arrived  Transfers Overall transfer level: Needs assistance Equipment used: Rolling walker (2 wheeled);1 person hand held assist Transfers: Sit to/from Stand Sit to Stand: Min guard;Min assist         General transfer comment: variable assist with min guard to min assist(pt does not remember hand placement)  Ambulation/Gait Ambulation/Gait assistance: Min guard;Min assist Gait Distance (Feet): 60 Feet Assistive device: Rolling walker (2 wheeled) Gait  Pattern/deviations: Step-through pattern;Decreased stride length;Wide base of support;Trunk flexed(wide turns) Gait velocity: reduced Gait velocity interpretation: <1.31 ft/sec, indicative of household ambulator     Stairs             Wheelchair Mobility    Modified Rankin (Stroke Patients Only)       Balance Overall balance assessment: Needs assistance;History of Falls Sitting-balance support: Feet supported Sitting balance-Leahy Scale: Fair     Standing balance support: Bilateral upper extremity supported;During functional activity Standing balance-Leahy Scale: Poor Standing balance comment: requires walker for support                            Cognition Arousal/Alertness: Awake/alert Behavior During Therapy: WFL for tasks assessed/performed Overall Cognitive Status: Impaired/Different from baseline Area of Impairment: Memory;Following commands;Safety/judgement;Awareness                 Orientation Level: Situation   Memory: Decreased recall of precautions;Decreased short-term memory Following Commands: Follows one step commands inconsistently;Follows one step commands with increased time Safety/Judgement: Decreased awareness of safety;Decreased awareness of deficits Awareness: Intellectual Problem Solving: Slow processing;Requires verbal cues;Requires tactile cues        Exercises      General Comments General comments (skin integrity, edema, etc.): pt is in room with daughter and is waiting for her meal.  Pt is taking a short walk in the room since she does not have her mask.  Has limited safety  awareness with transfers, poor control of descent due to not reaching back to chair      Pertinent Vitals/Pain Pain Assessment: No/denies pain    Home Living                      Prior Function            PT Goals (current goals can now be found in the care plan section) Acute Rehab PT Goals Patient Stated Goal: to get  stronger Progress towards PT goals: Progressing toward goals    Frequency    Min 2X/week      PT Plan Current plan remains appropriate    Co-evaluation              AM-PAC PT "6 Clicks" Mobility   Outcome Measure  Help needed turning from your back to your side while in a flat bed without using bedrails?: A Little Help needed moving from lying on your back to sitting on the side of a flat bed without using bedrails?: A Little Help needed moving to and from a bed to a chair (including a wheelchair)?: A Lot Help needed standing up from a chair using your arms (e.g., wheelchair or bedside chair)?: A Lot Help needed to walk in hospital room?: A Little Help needed climbing 3-5 steps with a railing? : A Lot 6 Click Score: 15    End of Session Equipment Utilized During Treatment: Gait belt Activity Tolerance: Patient limited by fatigue;Other (comment) Patient left: in chair;with call bell/phone within reach;with chair alarm set Nurse Communication: Mobility status PT Visit Diagnosis: Unsteadiness on feet (R26.81);Muscle weakness (generalized) (M62.81);Difficulty in walking, not elsewhere classified (R26.2);History of falling (Z91.81);Other abnormalities of gait and mobility (R26.89)     Time: 1610-9604 PT Time Calculation (min) (ACUTE ONLY): 26 min  Charges:  $Gait Training: 8-22 mins $Therapeutic Activity: 8-22 mins                   Ramond Dial 08/31/2018, 2:50 PM   Mee Hives, PT MS Acute Rehab Dept. Number: [place] [facility] [place]

## 2018-09-01 ENCOUNTER — Encounter (HOSPITAL_COMMUNITY): Payer: Self-pay | Admitting: *Deleted

## 2018-09-01 LAB — GLUCOSE, CAPILLARY
Glucose-Capillary: 155 mg/dL — ABNORMAL HIGH (ref 70–99)
Glucose-Capillary: 189 mg/dL — ABNORMAL HIGH (ref 70–99)
Glucose-Capillary: 246 mg/dL — ABNORMAL HIGH (ref 70–99)
Glucose-Capillary: 177 mg/dL — ABNORMAL HIGH (ref 70–99)

## 2018-09-01 MED ORDER — POLYETHYLENE GLYCOL 3350 17 G PO PACK: 17.0000 g | PACK | Freq: Every day | ORAL | Status: DC | PRN

## 2018-09-01 NOTE — Progress Notes (Signed)
PROGRESS NOTE    Leslie Gamble  BJS:[id number] DOB: [birth year] DOA: 08/29/2018 PCP: Wenda Low, MD    Brief Narrative: HPI per Dr. Sula Rumple is a 78 y.o. female with medical history significant for type 2 diabetes, hypertension, hyperlipidemia, asthma, rheumatoid arthritis, depression, generalized anxiety disorder who presented to the ED for evaluation of generalized weakness.  Patient states she was in her usual state of health until early this morning around 1 AM when she attempted to get out of bed to go to the bathroom but felt weak in both of her legs.  She had difficulty standing on her own power and states she "slid" gently off her bed onto the floor without fall or injury.  She had no loss of consciousness, numbness, tingling, loss of bowel/bladder control, chest pain, palpitations, dyspnea, cough, diarrhea, or constipation.  She reports nausea without emesis.  She denies any back pain. Patient states she normally ambulates with the use of a cane when outside the house but does not use assistive devices while in the home.  She lives with her son who called EMS services as she had continued weakness in her legs. She reports a history of rheumatoid arthritis for which she takes leflunomide.  Her RA normally affects her joints of her hands and previously the knees however improved after beginning antirheumatic therapy. In ED: Initial vitals showed BP 152/81, pulse 82, RR 11, temp 98.2 Fahrenheit, SPO2 96% on room air. Labs notable for potassium 2.8, magnesium 1.5, sodium 144, bicarb 25, BUN 11, creatinine 0.69, CK 401, WBC 9.9, hemoglobin 13.2, platelets 305,000, UDS positive for benzodiazepines (expected as she takes alprazolam at home).  UA was negative for UTI.  High-sensitivity troponin I was 13 and 15. Portable chest x-ray showed minimal left midlung scarring without focal consolidation or edema. X-ray lumbar spine showed mild lumbar spondylosis without acute findings. Bilateral  hip x-ray showed mild bilateral hip OA without evidence of hip fracture or dislocation. Left knee x-ray showed findings suggestive of a lipohemarthrosis without discrete fracture line. CT left knee showed small osseous fragments adjacent to the medial tibial spine and intercondylar notch felt to represent mildly displaced avulsion fractures, moderate knee joint effusion, and suboptimally evaluated heterogeneous signal within the region of the ACL. CT head and cervical spine without contrast were negative for acute intracranial abnormalities or cervical fracture or acute finding.  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 Patient was given 1 L normal saline, IV magnesium, and IV K 10 mEq x 3 runs.  The hospitalist service was consulted for further evaluation and management.   Assessment & Plan:   Principal Problem:   Generalized weakness Active Problems:   Diabetes mellitus, type 2 (HCC)   Depression   Essential hypertension   Asthma   Hypomagnesemia   Hypokalemia   Pain and swelling of left knee   Generalized anxiety disorder   Generalized weakness/ambulatory dysfunction POA, multifactorial -In the setting of dehydration, electrolyte abnormalities, arthritis, poor exercise tolerance, deconditioning.  No overt signs or symptoms for fracture, neurological impingement causing weakness. -Back pain reproducible - likely musculoskeletal given recent falls -Patient denies any bladder or stool incontinence. -Plain films of the back and bilateral hip are negative for any acute findings.   -Electrolyte abnormalities resolving -Concurrent deconditioning, known lumbar/rheumatoid disease.   -PT/OT. Consult with CIR to see if patient is a acceptable candidate.  Lactic acidosis without anion gap, resolved -Possibly secondary to poor  po intake/dehydration -Resolved with fluids well as increase p.o. intake  Hypokalemia/hypomagnesemia,  resolved -Replete  Left knee swelling  Concurrent Rheumatoid arthritis -Moderate effusion seen on CT left knee with possible mild evulsion fractures.  Mild swelling on examination without increased warmth or tenderness to palpation.   -Continue home regimen hydrocodone as needed for pain.   -PT/OT continue to follow, we appreciate insight and recommendations  -Continue on home dose leflunomide.  Non insulin dependent- Diabetes mellitus type 2 -Continue to hold oral hypoglycemic agents.  Sliding scale insulin.   -A1C 6.3 - generally well controlled on metformin alone at home with diet.  Hypertension, essential -Stable.  Continue losartan and amlodipine.  Hyperlipidemia -Continue statin.  Depression -Continue home regimen Cymbalta.  DVT prophylaxis: Heparin Code Status: Full Disposition Plan: CIR pending bed availability Consultants:   None  Imaging:   CT left knee 08/29/2018  CT head and CT C-spine 08/29/2018  CT left knee 08/29/2018  Plain films of the hips and pelvis 08/29/2018  CXR 08/29/2018  Antimicrobials:   None   Subjective: No acute issues or events overnight, patient sitting up in bed appears well rested, declines chest pain, shortness of breath, headache, fever, chills, nausea, vomiting, diarrhea, constipation.  Objective: Vitals:   08/31/18 1954 09/01/18 0400 09/01/18 0500 09/01/18 0744  BP: (!) 144/77 (!) 155/85    Pulse: 76 75  75  Resp: 15 17  17   Temp: (!) 96.7 F (35.9 C) 97.6 F (36.4 C)    TempSrc:      SpO2: 96% 94%  96%  Weight:   74.9 kg   Height:        Intake/Output Summary (Last 24 hours) at 09/01/2018 0818 Last data filed at 09/01/2018 0640 Gross per 24 hour  Intake 480 ml  Output 700 ml  Net -220 ml   Filed Weights   08/30/18 1142 08/31/18 0600 09/01/18 0500  Weight: 72 kg 73 kg 74.9 kg    Examination:  General exam: Appears calm and comfortable  Respiratory system: Clear to auscultation. Respiratory effort  normal. Cardiovascular system: S1 & S2 heard, RRR. No JVD, murmurs, rubs, gallops or clicks. No pedal edema. Gastrointestinal system: Abdomen is nondistended, soft and nontender. No organomegaly or masses felt. Normal bowel sounds heard.  Patient with bilateral lower lumbar pain. Central nervous system: Alert and oriented. No focal neurological deficits. Extremities: Symmetric 5 x 5 power. Skin: No rashes, lesions or ulcers Psychiatry: Judgement and insight appear normal. Mood & affect appropriate.   Data Reviewed: I have personally reviewed following labs and imaging studies  CBC: Recent Labs  Lab 08/29/18 1230 08/30/18 0256 08/31/18 0351  WBC 9.9 9.5 9.2  NEUTROABS 6.9  --   --   HGB 13.2 12.6 12.1  HCT 41.5 38.2 36.9  MCV 88.3 86.2 86.2  PLT 305 300 338   Basic Metabolic Panel: Recent Labs  Lab 08/29/18 1430 08/29/18 1711 08/29/18 1910 08/30/18 0256 08/31/18 0351  NA 144  --   --  144 140  K 4.0 2.8*  --  3.3* 3.4*  CL 107  --   --  108 105  CO2 25  --   --  25 24  GLUCOSE 95  --   --  159* 237*  BUN 11  --   --  9 15  CREATININE 0.69  --   --  0.67 0.80  CALCIUM 8.8*  --   --  8.5* 8.5*  MG 1.5*  --   --  1.7 1.7  PHOS  --   --  2.6  --   --    GFR: Estimated Creatinine Clearance: 63.2 mL/min (by C-G formula based on SCr of 0.8 mg/dL).   Liver Function Tests: Recent Labs  Lab 08/29/18 1430  AST 31  ALT 15  ALKPHOS 82  BILITOT 1.2  PROT 7.1  ALBUMIN 3.4*   Cardiac Enzymes: Recent Labs  Lab 08/29/18 1430  CKTOTAL 401*   HbA1C: Recent Labs    08/29/18 1858  HGBA1C 6.3*   CBG: Recent Labs  Lab 08/31/18 0740 08/31/18 1238 08/31/18 1648 08/31/18 2041 09/01/18 0749  GLUCAP 196* 282* 217* 254* 155*   Thyroid Function Tests: Recent Labs    08/29/18 2033  TSH 0.457     Recent Results (from the past 240 hour(s))  Urine culture     Status: None   Collection Time: 08/29/18 11:59 AM   Specimen: Urine, Random  Result Value Ref Range  Status   Specimen Description URINE, RANDOM  Final   Special Requests NONE  Final   Culture   Final    NO GROWTH Performed at [facility], [place] [address], [place], [place] [address]    Report Status 08/30/2018 FINAL  Final  SARS Coronavirus 2 [facility] order, Performed in [facility] hospital lab) Nasopharyngeal Nasopharyngeal Swab     Status: None   Collection Time: 08/29/18  7:30 PM   Specimen: Nasopharyngeal Swab  Result Value Ref Range Status   SARS Coronavirus 2 NEGATIVE NEGATIVE Final    Comment: (NOTE) If result is NEGATIVE SARS-CoV-2 target nucleic acids are NOT DETECTED. The SARS-CoV-2 RNA is generally detectable in upper and lower  respiratory specimens during the acute phase of infection. The lowest  concentration of SARS-CoV-2 viral copies this assay can detect is 250  copies / mL. A negative result does not preclude SARS-CoV-2 infection  and should not be used as the sole basis for treatment or other  patient management decisions.  A negative result may occur with  improper specimen collection / handling, submission of specimen other  than nasopharyngeal swab, presence of viral mutation(s) within the  areas targeted by this assay, and inadequate number of viral copies  (<250 copies / mL). A negative result must be combined with clinical  observations, patient history, and epidemiological information. If result is POSITIVE SARS-CoV-2 target nucleic acids are DETECTED. The SARS-CoV-2 RNA is generally detectable in upper and lower  respiratory specimens dur ing the acute phase of infection.  Positive  results are indicative of active infection with SARS-CoV-2.  Clinical  correlation with patient history and other diagnostic information is  necessary to determine patient infection status.  Positive results do  not rule out bacterial infection or co-infection with other viruses. If result is PRESUMPTIVE POSTIVE SARS-CoV-2 nucleic acids MAY BE PRESENT.   A  presumptive positive result was obtained on the submitted specimen  and confirmed on repeat testing.  While 2019 novel coronavirus  (SARS-CoV-2) nucleic acids may be present in the submitted sample  additional confirmatory testing may be necessary for epidemiological  and / or clinical management purposes  to differentiate between  SARS-CoV-2 and other Sarbecovirus currently known to infect humans.  If clinically indicated additional testing with an alternate test  methodology [phone]) is advised. The SARS-CoV-2 RNA is generally  detectable in upper and lower respiratory sp ecimens during the acute  phase of infection. The expected result is Negative. Fact Sheet for Patients:  StrictlyIdeas.no Fact  Sheet for Healthcare Providers: BankingDealers.co.za This test is not yet approved or cleared by the [place] and has been authorized for detection and/or diagnosis of SARS-CoV-2 by FDA under an Emergency Use Authorization (EUA).  This EUA will remain in effect (meaning this test can be used) for the duration of the COVID-19 declaration under Section 564(b)(1) of the Act, 21 U.S.C. section 360bbb-3(b)(1), unless the authorization is terminated or revoked sooner. Performed at [facility], [place] [address], [place], [place] [address]   MRSA PCR Screening     Status: None   Collection Time: 08/29/18  9:19 PM   Specimen: Nasal Mucosa; Nasopharyngeal  Result Value Ref Range Status   MRSA by PCR NEGATIVE NEGATIVE Final    Comment:        The GeneXpert MRSA Assay (FDA approved for NASAL specimens only), is one component of a comprehensive MRSA colonization surveillance program. It is not intended to diagnose MRSA infection nor to guide or monitor treatment for MRSA infections. Performed at [facility], [place] [address], [place], [place] [address]      Radiology Studies: No results found.  Scheduled Meds:  amLODipine   10 mg Oral Daily   aspirin EC  81 mg Oral Daily   DULoxetine  60 mg Oral BID   fluticasone furoate-vilanterol  1 puff Inhalation Daily   heparin  5,000 Units Subcutaneous Q8H   insulin aspart  0-9 Units Subcutaneous TID WC   leflunomide  20 mg Oral Daily   losartan  100 mg Oral Daily   pravastatin  10 mg Oral q1800   sodium chloride flush  3 mL Intravenous Q12H   Continuous Infusions:   LOS: 2 days   Time spent: 35 minutes  Little Ishikawa, DO [facility]  If 7PM-7AM, please contact night-coverage [email or web address] 09/01/2018, 8:18 AM

## 2018-09-01 NOTE — TOC Progression Note (Signed)
Transition of Care [facility]) - Progression Note    Patient Details  Name: Leslie Gamble MRN: [id number] Date of Birth: May 05, 1940  [facility] of [facility]) CM/SW Contact  Loletha Grayer Beverely Pace, RN Phone Number: [phone] ( working remotely) 09/01/2018, 4:00 PM  Clinical Narrative:  46 yr ol female admitted after sliding off her bed with noted R knee effusion and mildly displaced avulsion fractures to R L knee . Case manager spoke with patient and her daughter Leslie Gamble via phone concerning discharge plan. Patient is hoping for a bed at CIR, will be reevaluated for that tomorrow. Case manager discussed having aback up plan. Patient adamant that she will not go to SNF, will go home with Home Health. Patient would benefit from [facility] First program with [facility] should she have to go home. Case manager discussed this option with patient and her daughter and they both argeed to [facility] being contacted. Leslie Gamble asked that should her mom get a bed at CIR if at discharge she would still be able to have service with [facility], CM asked that she mention it to SW on CIR, but it will also be documented. CM will continue to monitor.         Expected Discharge Plan and Services : being determined     Discharge Planning Services: CM Consult                               HH Arranged: PT, OT, Nurse's Aide [place] Agency: [place] Date [place]: 09/01/18 Time [place]: 1558 Representative spoke with at [place]: [place] ([place]) Interventions    Readmission Risk Interventions No flowsheet data found.

## 2018-09-01 NOTE — Plan of Care (Signed)
Patient is going to inpt rehab, will be walking in the hallway today

## 2018-09-01 NOTE — Progress Notes (Signed)
Occupational Therapy Treatment Patient Details Name: Leslie Gamble MRN: [id number] DOB: 1940/03/28 Today's Date: 09/01/2018    History of present illness 78 yo female with onset of sliding off bed was brought to hosp, noted R knee effusion and mildly displaced avulsion fractures to R L knee on medial tibial spine, intercondylar notch.  Pt complains of lumbar spine pain but has spondylosis.  PMHx:  AKI, OA, asthma, depression, DM, anxiety, HTN, aortic atherosclerosis, R frontal infarct, DDD cervical spine, anterolisthesis L4 on L5, with lumbar spondylosis, RA   OT comments  Pt seen for ADL progression and functional mobility. Overall pt MIN guard for functional mobility and supervision for safety during toileting. After ADL tasks pt agreeable to functional mobility in the hallway with RW. Pt walk 140 ft with MIN guard. Pt very motivated about "wanting to care for herself." Discussed DC plan with pt and daughter. Agree that pt could dc home with [facility] program such as [place] [facility] if services are available. Will continue to follow for acute OT needs.   Follow Up Recommendations  Home health OT    Equipment Recommendations  3 in 1 bedside commode;Tub/shower bench    Recommendations for Other Services      Precautions / Restrictions Precautions Precautions: Fall Restrictions Weight Bearing Restrictions: No       Mobility Bed Mobility               General bed mobility comments: up in chair when OT arrived  Transfers Overall transfer level: Needs assistance Equipment used: Rolling walker (2 wheeled) Transfers: Sit to/from Stand Sit to Stand: Min guard         General transfer comment: MIN guard for transfers with intermittent verbal cues for safety; pt ambulated 140 ft with OT in hallway with MIN guard    Balance Overall balance assessment: Needs assistance;History of Falls Sitting-balance support: Feet supported Sitting balance-Leahy Scale: Fair     Standing  balance support: Single extremity supported Standing balance-Leahy Scale: Good Standing balance comment: pt able to lift one extremity and reach down to pull sock up while standing with RW- cued pt to perform task in sitting to decrease risk of falls                           ADL either performed or assessed with clinical judgement   ADL Overall ADL's : Needs assistance/impaired Eating/Feeding: Independent;Sitting Eating/Feeding Details (indicate cue type and reason): pt up in chair eating lunch upon OT arrival Grooming: Wash/dry hands;Standing;Min guard Grooming Details (indicate cue type and reason): cues for safety to stay inside RW when reaching out of BOS for soap                 Toilet Transfer: RW;Ambulation;Cueing for safety;Regular Toilet;Min guard(3:1 over regular toilet)   Toileting- Clothing Manipulation and Hygiene: Sit to/from stand;Supervision/safety Toileting - Clothing Manipulation Details (indicate cue type and reason): pt with heavy anterior lean on RW when completing hygiene- cued pt to maintain upright posture during hygiene to minimize risk for falls during toileting     Functional mobility during ADLs: Min guard;Rolling walker General ADL Comments: pt require assist for safety during ADLs     Vision       Perception     Praxis      Cognition Arousal/Alertness: Awake/alert Behavior During Therapy: WFL for tasks assessed/performed Overall Cognitive Status: Within Functional Limits for tasks assessed Area of Impairment: Safety/judgement  Orientation Level: Person;Place;Time;Situation Current Attention Level: Sustained   Following Commands: Follows one step commands with increased time Safety/Judgement: Decreased awareness of safety Awareness: Intellectual Problem Solving: Requires verbal cues General Comments: requires verbal cues for safety concerns        Exercises     Shoulder Instructions        General Comments      Pertinent Vitals/ Pain       Pain Assessment: No/denies pain  Home Living                                          Prior Functioning/Environment              Frequency  Min 2X/week        Progress Toward Goals  OT Goals(current goals can now be found in the care plan section)  Progress towards OT goals: Progressing toward goals  Acute Rehab OT Goals Time For Goal Achievement: 09/06/18 Potential to Achieve Goals: Good  Plan Discharge plan needs to be updated    Co-evaluation                 AM-PAC OT "6 Clicks" Daily Activity     Outcome Measure   Help from another person eating meals?: None Help from another person taking care of personal grooming?: A Little Help from another person toileting, which includes using toliet, bedpan, or urinal?: A Little Help from another person bathing (including washing, rinsing, drying)?: A Little Help from another person to put on and taking off regular upper body clothing?: None Help from another person to put on and taking off regular lower body clothing?: A Little 6 Click Score: 20    End of Session Equipment Utilized During Treatment: Gait belt;Rolling walker;Other (comment)  OT Visit Diagnosis: Unsteadiness on feet (R26.81);Other abnormalities of gait and mobility (R26.89);Muscle weakness (generalized) (M62.81);History of falling (Z91.81);Other symptoms and signs involving cognitive function   Activity Tolerance Patient tolerated treatment well;No increased pain   Patient Left in chair;with call bell/phone within reach;with family/visitor present   Nurse Communication          Time: 0102-7253 OT Time Calculation (min): 28 min  Charges: OT General Charges $OT Visit: 1 Visit OT Treatments $Self Care/Home Management : 23-37 mins  [place]  Acute Rehab  [phone]   Ihor Gully 09/01/2018, 3:10 PM

## 2018-09-01 NOTE — Progress Notes (Signed)
Inpatient Rehabilitation-Admissions Coordinator   We do not have a CIR bed for this patient today. Will follow up daily for bed availability as well as functional need of IP Rehab. Pt may continue to progress to a point where she no longer requires CIR. Pt and her daughter aware.   Jhonnie Garner, OTR/L  Rehab Admissions Coordinator  [phone] 09/01/2018 11:33 AM

## 2018-09-01 NOTE — Care Management (Addendum)
Case manager left message for patient's daughter, [name] [phone] concerning need for shortterm rehab.   Ricki Miller, RN BSN Case Manager [phone]

## 2018-09-01 NOTE — Plan of Care (Signed)
  Problem: Pain Managment: Goal: General experience of comfort will improve Outcome: Progressing   Problem: Safety: Goal: Ability to remain free from injury will improve Outcome: Progressing   Problem: Skin Integrity: Goal: Risk for impaired skin integrity will decrease Outcome: Progressing   

## 2018-09-02 LAB — GLUCOSE, CAPILLARY
Glucose-Capillary: 175 mg/dL — ABNORMAL HIGH (ref 70–99)
Glucose-Capillary: 316 mg/dL — ABNORMAL HIGH (ref 70–99)

## 2018-09-02 MED ORDER — MULTI-VITAMIN/MINERALS PO TABS: 1.0000 | ORAL_TABLET | Freq: Every day | ORAL | 2 refills | Status: AC

## 2018-09-02 NOTE — Progress Notes (Signed)
Inpatient Rehabilitation-Admissions Coordinator   Noted pt is progressing well and recommendations have been updated to Home with [facility]. I agree with PT and OT recommendations at this time. Pt is no longer in need of CIR. AC will sign off. CM, MD, pt and son aware of current recommendations.   Please call if questions.   Jhonnie Garner, OTR/L  Rehab Admissions Coordinator  [phone] 09/02/2018 1:41 PM

## 2018-09-02 NOTE — Discharge Summary (Signed)
Physician Discharge Summary  Leslie Gamble AST:[id number] DOB: 1940/07/20 DOA: 08/29/2018  PCP: Wenda Low, MD  Admit date: 08/29/2018 Discharge date: 09/02/2018  Admitted From: Home Disposition: Home with home health  Recommendations for Outpatient Follow-up:  1. Follow up with PCP in 1-2 weeks 2. Please obtain BMP/CBC in one week  Home Health: Yes Equipment/Devices: Rolling Walker  Discharge Condition: Guarded CODE STATUS: Full Diet recommendation: As tolerated  Brief/Interim Summary: Leslie Gamble a 78 y.o.femalewith medical history significant fortype 2 diabetes, hypertension, hyperlipidemia, asthma, rheumatoid arthritis, depression, generalized anxiety disorder who presented to the ED for evaluation of generalized weakness.Patient states she was in her usual state of health until early this morning around 1 AM when she attempted to get out of bed to go to the bathroom but felt weak in both of her legs. She had difficulty standing on her own power and states she "slid" gently off her bed onto the floor without fall or injury. She had no loss of consciousness, numbness, tingling, loss of bowel/bladder control, chest pain, palpitations, dyspnea, cough, diarrhea, or constipation. She reports nausea without emesis. She denies any back pain. Patient states she normally ambulates with the use of a cane when outside the house but does not use assistive devices while in the home. She lives with her son who called EMS services as she had continued weakness in her legs. She reports a history of rheumatoid arthritis for which she takes leflunomide. Her RA normally affects her joints of her hands and previously the knees however improved after beginning antirheumatic therapy. In ED: Initial vitals showed BP 152/81, pulse 82, RR 11, temp 98.2 Fahrenheit, SPO2 96% on room air. Labs notable for potassium 2.8, magnesium 1.5, sodium 144, bicarb 25, BUN 11, creatinine 0.69, CK 401, WBC 9.9,  hemoglobin 13.2, platelets 305,000, UDS positive for benzodiazepines (expected as she takes alprazolam at home). UA was negative for UTI.High-sensitivity troponin I was 13 and 15. Portable chest x-ray showed minimal left midlung scarring without focal consolidation or edema. X-ray lumbar spine showed mild lumbar spondylosis without acute findings. Bilateral hip x-ray showed mild bilateral hip OA without evidence of hip fracture or dislocation. Left knee x-ray showed findings suggestive of a lipohemarthrosis without discrete fracture line. CT left knee showed small osseous fragments adjacent to the medial tibial spine and intercondylar notch felt to represent mildly displaced avulsion fractures, moderate knee joint effusion, and suboptimally evaluated heterogeneous signal within the region of the ACL. CT head and cervical spine without contrast were negative for acute intracranial abnormalities or cervical fracture or acute finding. 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 Patient was given 1 L normal saline, IV magnesium, and IV K 10 mEq x 3 runs. The hospitalist service was consulted for further evaluation and management.  Patient met as above with acute on chronic generalized weakness, ambulatory dysfunction.  Patient had remarkable electrolyte abnormalities, poor p.o. intake, poor exercise tolerance and deconditioning all likely culminating in her symptoms.  Patient is now had electrolytes repleted, tolerating p.o. more adequately, increase p.o. intake of free water and no longer appears dehydrated.  Patient was previously being evaluated for possible placement at [facility] given her ongoing weakness and debility however over the past 48 hours patient has made drastic improvements, now ambulating upwards of 100 feet with minimal assistance on a rolling walker no longer meets criteria.  At this time given patient's drastic improvement recommending  discharge home with home health per  PT recommendations.  Patient otherwise stable and agreeable for discharge home.  Recommended ongoing close follow-up with PCP in the next week for repeat labs and further evaluation.  We discussed the need for appropriate p.o. intake at home including free water, healthy diet as patient appears to be eating only 1-2 small meals per day this likely exacerbated her ongoing debility given poor exercise tolerance and activity levels over the past few months in the setting of coronavirus.  Patient otherwise stable and agreeable for discharge at this time.  Discharge Diagnoses:  Principal Problem:   Generalized weakness Active Problems:   Diabetes mellitus, type 2 (HCC)   Depression   Essential hypertension   Asthma   Hypomagnesemia   Hypokalemia   Pain and swelling of left knee   Generalized anxiety disorder  Discharge Instructions  Discharge Instructions    Call MD for:  difficulty breathing, headache or visual disturbances   Complete by: As directed    Call MD for:  extreme fatigue   Complete by: As directed    Call MD for:  persistant dizziness or light-headedness   Complete by: As directed    Call MD for:  persistant nausea and vomiting   Complete by: As directed    Call MD for:  severe uncontrolled pain   Complete by: As directed    Diet - low sodium heart healthy   Complete by: As directed    Increase activity slowly   Complete by: As directed      Allergies as of 09/02/2018      Reactions   Avelox [moxifloxacin Hcl In Nacl] Itching, Swelling   Cephalexin    Nose swelling   Ciprofloxacin Swelling   Clarithromycin Swelling   Clindamycin Hcl Swelling   Codeine    Lip swelling   Diflucan [fluconazole] Diarrhea   Lidocaine Swelling   Metronidazole Swelling   Nitrofurantoin Monohyd Macro Swelling   Other    Magic mouthwash    Penicillins Swelling   Shrimp [shellfish Allergy] Swelling   In nose and lips   Sitagliptin Swelling   Sulfa  Antibiotics Swelling   Tetracyclines & Related Swelling   Tramadol Hcl    REACTION: facial swelling, dyspnea      Medication List    STOP taking these medications   mometasone-formoterol 100-5 MCG/ACT Aero Commonly known as: DULERA   terconazole 0.4 % vaginal cream Commonly known as: TERAZOL 7     TAKE these medications   acetaminophen 500 MG tablet Commonly known as: TYLENOL Take 1,000 mg by mouth every 6 (six) hours as needed for headache.   albuterol 108 (90 Base) MCG/ACT inhaler Commonly known as: VENTOLIN HFA Inhale 1-2 puffs into the lungs every 6 (six) hours as needed for wheezing or shortness of breath.   ALPRAZolam 0.25 MG tablet Commonly known as: XANAX Take 0.25 mg by mouth 2 (two) times daily as needed for anxiety.   amLODipine 10 MG tablet Commonly known as: NORVASC Take 10 mg by mouth daily.   aspirin 81 MG EC tablet Take 81 mg by mouth daily.   BD Pen Needle Nano U/F 32G X 4 MM Misc Generic drug: Insulin Pen Needle AS DIRECTED THREE TIMES A DAY SQ 30 DAYS   Breo Ellipta 100-25 MCG/INH Aepb Generic drug: fluticasone furoate-vilanterol Inhale 1 puff into the lungs daily.   DULoxetine 60 MG capsule Commonly known as: CYMBALTA Take 1 capsule (60 mg total) by mouth 2 (two) times daily.   ferrous sulfate 325 (65  FE) MG EC tablet Take 325 mg by mouth 2 (two) times daily with a meal.   fluticasone 50 MCG/ACT nasal spray Commonly known as: FLONASE Place 2 sprays into both nostrils daily as needed for allergies.   insulin aspart 100 UNIT/ML injection Commonly known as: novoLOG Inject 0-9 Units into the skin 3 (three) times daily with meals. CBG < 70: implement hypoglycemia protocol CBG 70 - 120: 0 units CBG 121 - 150: 1 unit CBG 151 - 200: 2 units CBG 201 - 250: 3 units CBG 251 - 300: 5 units CBG 301 - 350: 7 units CBG 351 - 400: 9 units CBG > 400: call MD.   leflunomide 20 MG tablet Commonly known as: ARAVA Take 20 mg by mouth daily.    losartan 100 MG tablet Commonly known as: Cozaar Take 1 tablet (100 mg total) by mouth daily.   metFORMIN 1000 MG tablet Commonly known as: GLUCOPHAGE Take 1,000 mg by mouth daily. At lunch time   mirtazapine 30 MG tablet Commonly known as: REMERON Take 1 tablet (30 mg total) by mouth at bedtime.   multivitamin with minerals tablet Take 1 tablet by mouth daily.   OneTouch Verio test strip Generic drug: glucose blood USE TO TEST BLOOD GLUCOSE 3 TIMES A DAY THREE TIMES A DAY & AS DIRECTED DX E11.9 90 DAYS   Oxycodone HCl 10 MG Tabs Take 0.5 tablets (5 mg total) by mouth every 6 (six) hours as needed. For knee pain   pantoprazole 40 MG tablet Commonly known as: PROTONIX Take 40 mg by mouth daily.   polyethylene glycol 17 g packet Commonly known as: MIRALAX / GLYCOLAX Take 17 g by mouth daily. What changed:   when to take this  reasons to take this   pravastatin 10 MG tablet Commonly known as: PRAVACHOL Take 10 mg by mouth daily.   Systane 0.4-0.3 % Gel ophthalmic gel Generic drug: Polyethyl Glycol-Propyl Glycol Place 1 application into both eyes as needed (dry eyes).   Tyler Aas FlexTouch 100 UNIT/ML Sopn FlexTouch Pen Generic drug: insulin degludec Inject 26 Units into the skin daily.            Durable Medical Equipment  (From admission, onward)         Start     Ordered   09/02/18 1222  For home use only DME Walker rolling  [facility])  Once    Question:  Patient needs a walker to treat with the following condition  Answer:  Ambulatory dysfunction   09/02/18 1221         Follow-up Information    [facility] Follow up.   Specialty: Home Health Services Contact information: [place] [phone] [phone]          Allergies  Allergen Reactions  . Avelox [Moxifloxacin Hcl In Nacl] Itching and Swelling  . Cephalexin     Nose swelling  . Ciprofloxacin Swelling  . Clarithromycin Swelling  .  Clindamycin Hcl Swelling  . Codeine     Lip swelling  . Diflucan [Fluconazole] Diarrhea  . Lidocaine Swelling  . Metronidazole Swelling  . Nitrofurantoin Monohyd Macro Swelling  . Other     Magic mouthwash   . Penicillins Swelling  . Shrimp [Shellfish Allergy] Swelling    In nose and lips  . Sitagliptin Swelling  . Sulfa Antibiotics Swelling  . Tetracyclines & Related Swelling  . Tramadol Hcl     REACTION: facial swelling, dyspnea  Procedures/Studies: Dg Lumbar Spine Complete  Result Date: 08/29/2018 CLINICAL DATA:  Fall, low back pain EXAM: LUMBAR SPINE - COMPLETE 4+ VIEW COMPARISON:  02/22/2014 FINDINGS: Five lumbar type vertebral segments. No acute fracture or static subluxation. Minimal grade 1 anterolisthesis L4 on L5. Intervertebral disc spaces are relatively preserved. Vertebral body heights are maintained. Lower lumbar facet arthrosis. IMPRESSION: Mild lumbar spondylosis.  No acute findings. Electronically Signed   By: Davina Poke M.D.   On: 08/29/2018 13:51   Ct Head Wo Contrast  Result Date: 08/29/2018 CLINICAL DATA:  Generalized body weakness today. No injury. History of rheumatoid arthritis. EXAM: CT HEAD WITHOUT CONTRAST CT CERVICAL SPINE WITHOUT CONTRAST TECHNIQUE: Multidetector CT imaging of the head and cervical spine was performed following the standard protocol without intravenous contrast. Multiplanar CT image reconstructions of the cervical spine were also generated. COMPARISON:  12/11/2012 FINDINGS: CT HEAD FINDINGS Brain: No evidence of acute infarction, hemorrhage, hydrocephalus, extra-axial collection or mass lesion/mass effect. There is ventricular and sulcal enlargement reflecting mild diffuse atrophy. Patchy areas of white matter hypoattenuation noted consistent with moderate chronic microvascular ischemic change. 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 Vascular: No hyperdense vessel or unexpected calcification. Skull: Normal. Negative for fracture or focal lesion.  Sinuses/Orbits: Visualized globes and orbits are unremarkable. The visualized sinuses and mastoid air cells are clear. Other: None. CT CERVICAL SPINE FINDINGS Alignment: Mild reversal of the cervical lordosis, apex at C3-C4, which appears, at least in part, positional. No spondylolisthesis/subluxation. Skull base and vertebrae: No acute fracture. No primary bone lesion or focal pathologic process. There is incomplete fusion of the right lamina of C1, stable from the previous CT scan dated 08/04/2012. Soft tissues and spinal canal: No prevertebral fluid or swelling. No visible canal hematoma. Disc levels: Moderate loss of disc height at C3-C4. Mild loss of disc height at C4-C5 and C5-C6. Remaining discs are well preserved in height. No convincing disc herniation. Upper chest: No acute findings. Changes of emphysema at the lung apices. Other: None. IMPRESSION: HEAD CT 1. No acute intracranial abnormalities. 2. Atrophy and chronic microvascular ischemic change. 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 CERVICAL CT 1. No fracture or acute finding. Electronically Signed   By: Lajean Manes M.D.   On: 08/29/2018 14:34   Ct Cervical Spine Wo Contrast  Result Date: 08/29/2018 CLINICAL DATA:  Generalized body weakness today. No injury. History of rheumatoid arthritis. EXAM: CT HEAD WITHOUT CONTRAST CT CERVICAL SPINE WITHOUT CONTRAST TECHNIQUE: Multidetector CT imaging of the head and cervical spine was performed following the standard protocol without intravenous contrast. Multiplanar CT image reconstructions of the cervical spine were also generated. COMPARISON:  12/11/2012 FINDINGS: CT HEAD FINDINGS Brain: No evidence of acute infarction, hemorrhage, hydrocephalus, extra-axial collection or mass lesion/mass effect. There is ventricular and sulcal enlargement reflecting mild diffuse atrophy. Patchy areas of white matter hypoattenuation noted consistent with moderate chronic microvascular ischemic change. Small old frontal  lobe infarct. Vascular: No hyperdense vessel or unexpected calcification. Skull: Normal. Negative for fracture or focal lesion. Sinuses/Orbits: Visualized globes and orbits are unremarkable. The visualized sinuses and mastoid air cells are clear. Other: None. CT CERVICAL SPINE FINDINGS Alignment: Mild reversal of the cervical lordosis, apex at C3-C4, which appears, at least in part, positional. No spondylolisthesis/subluxation. Skull base and vertebrae: No acute fracture. No primary bone lesion or focal pathologic process. There is incomplete fusion of the right lamina of C1, stable from the previous CT scan dated 08/04/2012. Soft tissues and spinal canal: No prevertebral fluid or swelling. No  visible canal hematoma. Disc levels: Moderate loss of disc height at C3-C4. Mild loss of disc height at C4-C5 and C5-C6. Remaining discs are well preserved in height. No convincing disc herniation. Upper chest: No acute findings. Changes of emphysema at the lung apices. Other: None. IMPRESSION: HEAD CT 1. No acute intracranial abnormalities. 2. Atrophy and chronic microvascular ischemic change. 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 CERVICAL CT 1. No fracture or acute finding. Electronically Signed   By: Lajean Manes M.D.   On: 08/29/2018 14:34   Ct Knee Left Wo Contrast  Result Date: 08/29/2018 CLINICAL DATA:  Left knee fracture question EXAM: CT OF THE left KNEE WITHOUT CONTRAST TECHNIQUE: Multidetector CT imaging of the left knee was performed according to the standard protocol. Multiplanar CT image reconstructions were also generated. COMPARISON:  August 29, 2018 FINDINGS: Bones/Joint/Cartilage There are small osseous fragments seen from the medial tibial spine at the intercondylar notch best seen on series 7, image 56 which could represent small acute fracture fragments. No other acute fracture or dislocation is seen. There is advanced tricompartmental osteoarthritis, most notable in the lateral compartment with joint  space loss, subchondral sclerosis and cystic changes. There is a fragmented enthesophyte seen at the superior lateral patellar pole. There also more posterior well corticated ossicles seen posteriorly likely loose bodies, best seen on series 7, image 64. Ligaments Suboptimally assessed by CT. There is heterogeneous density seen within the region of the ACL. Muscles and Tendons The patellar and quadriceps tendon are intact. There is mild fatty atrophy noted within the muscles surrounding the knee. Soft tissues A moderate knee joint effusion is seen with synovial thickening. There is soft tissue swelling seen predominantly along the lateral aspect of the knee. Scattered vascular calcifications are noted. IMPRESSION: 1. Small osseous fragments seen adjacent to the medial tibial spine at the intercondylar notch likely represent mildly displaced avulsion fractures. 2. Moderate knee joint effusion. 3. Heterogeneous signal seen within the region of the ACL, suboptimally evaluated by CT. If further evaluation is required would recommend MRI 4. Loose bodies seen posterior to the medial tibia. Electronically Signed   By: Prudencio Pair M.D.   On: 08/29/2018 16:43   Dg Chest Portable 1 View  Result Date: 08/29/2018 CLINICAL DATA:  Weakness EXAM: PORTABLE CHEST 1 VIEW COMPARISON:  December 27, 2016 FINDINGS: There is slight scarring in the left mid lung. There is no edema or consolidation. Heart is borderline enlarged with pulmonary vascularity normal. There is aortic atherosclerosis. No adenopathy. No bone lesions. IMPRESSION: Minimal scarring left mid lung. No edema or consolidation. Heart borderline enlarged. Aortic Atherosclerosis (ICD10-I70.0). Electronically Signed   By: Lowella Grip III M.D.   On: 08/29/2018 12:49   Dg Knee Complete 4 Views Left  Result Date: 08/29/2018 CLINICAL DATA:  Left knee pain and swelling after fall EXAM: LEFT KNEE - COMPLETE 4+ VIEW COMPARISON:  08/05/2012 FINDINGS: There is a  moderate-sized left knee joint effusion. Suggestion of a fat fluid level which would indicate the presence of a lipohemarthrosis. No discrete fracture line is identified. There are tricompartmental degenerative changes. Scattered vascular calcifications. IMPRESSION: Findings suggestive of a lipohemarthrosis. No discrete fracture line is identified. In the setting of trauma, a lipohemarthrosis suggests the presence of a radiographically occult fracture. Further evaluation with CT is recommended. Electronically Signed   By: Davina Poke M.D.   On: 08/29/2018 13:55   Dg Hips Bilat W Or Wo Pelvis 3-4 Views  Result Date: 08/29/2018 CLINICAL DATA:  Weakness. Additional  history: Patient states she fell from bed this morning, pain in lower back bilateral hips and left knee. EXAM: DG HIP (WITH OR WITHOUT PELVIS) 3-4V BILAT COMPARISON:  Abdominal radiographs 09/08/2017, CT abdomen/pelvis 04/07/2016 FINDINGS: There is no evidence of hip fracture or dislocation. Mild bilateral hip osteoarthrosis. Degenerative change of the visualized lower lumbar spine and pubic symphysis. Redemonstrated bowel anastomosis staples projecting over the pelvis. Overlying artifacts from EKG leads. IMPRESSION: No evidence of hip fracture or dislocation. Mild bilateral hip osteoarthrosis. Electronically Signed   By: Kellie Simmering   On: 08/29/2018 14:05    Subjective: No acute issues or events overnight, patient continues to have poor sleep regimen but otherwise declines chest pain, shortness of breath, nausea, vomiting, diarrhea, constipation, headache, fevers, chills.  Discharge Exam: Vitals:   09/02/18 0359 09/02/18 0827  BP: (!) 159/88 (S) (!) 152/90  Pulse: 76 72  Resp: 15 18  Temp: 98 F (36.7 C) 97.6 F (36.4 C)  SpO2: 90% 100%   Vitals:   09/01/18 1533 09/01/18 1950 09/02/18 0359 09/02/18 0827  BP: (!) 144/75 (!) 160/71 (!) 159/88 (S) (!) 152/90  Pulse: 75 80 76 72  Resp: _0 Temp: 98.3 F (36.8 C) 98.4 F  (36.9 C) 98 F (36.7 C) 97.6 F (36.4 C)  TempSrc: Oral Oral Oral Oral  SpO2: 92% 95% 90% 100%  Weight:      Height:        General:  Pleasantly resting in bed, No acute distress. HEENT:  Normocephalic atraumatic.  Sclerae nonicteric, noninjected.  Extraocular movements intact bilaterally. Neck:  Without mass or deformity.  Trachea is midline. Lungs:  Clear to auscultate bilaterally without rhonchi, wheeze, or rales. Heart:  Regular rate and rhythm.  Without murmurs, rubs, or gallops. Abdomen:  Soft, nontender, nondistended.  Without guarding or rebound. Extremities: Without cyanosis, clubbing, edema, or obvious deformity. Vascular:  Dorsalis pedis and posterior tibial pulses palpable bilaterally. Skin:  Warm and dry, no erythema, no ulcerations.   The results of significant diagnostics from this hospitalization (including imaging, microbiology, ancillary and laboratory) are listed below for reference.     Microbiology: Recent Results (from the past 240 hour(s))  Urine culture     Status: None   Collection Time: 08/29/18 11:59 AM   Specimen: Urine, Random  Result Value Ref Range Status   Specimen Description URINE, RANDOM  Final   Special Requests NONE  Final   Culture   Final    NO GROWTH Performed at [facility], [address] [place], [place] [address]    Report Status 08/30/2018 FINAL  Final  SARS Coronavirus 2 [facility] order, Performed in [facility] hospital lab) Nasopharyngeal Nasopharyngeal Swab     Status: None   Collection Time: 08/29/18  7:30 PM   Specimen: Nasopharyngeal Swab  Result Value Ref Range Status   SARS Coronavirus 2 NEGATIVE NEGATIVE Final    Comment: (NOTE) If result is NEGATIVE SARS-CoV-2 target nucleic acids are NOT DETECTED. The SARS-CoV-2 RNA is generally detectable in upper and lower  respiratory specimens during the acute phase of infection. The lowest  concentration of SARS-CoV-2 viral copies this assay can detect is 250  copies  / mL. A negative result does not preclude SARS-CoV-2 infection  and should not be used as the sole basis for treatment or other  patient management decisions.  A negative result may occur with  improper specimen collection / handling, submission of specimen other  than nasopharyngeal swab,  presence of viral mutation(s) within the  areas targeted by this assay, and inadequate number of viral copies  (<250 copies / mL). A negative result must be combined with clinical  observations, patient history, and epidemiological information. If result is POSITIVE SARS-CoV-2 target nucleic acids are DETECTED. The SARS-CoV-2 RNA is generally detectable in upper and lower  respiratory specimens dur ing the acute phase of infection.  Positive  results are indicative of active infection with SARS-CoV-2.  Clinical  correlation with patient history and other diagnostic information is  necessary to determine patient infection status.  Positive results do  not rule out bacterial infection or co-infection with other viruses. If result is PRESUMPTIVE POSTIVE SARS-CoV-2 nucleic acids MAY BE PRESENT.   A presumptive positive result was obtained on the submitted specimen  and confirmed on repeat testing.  While 2019 novel coronavirus  (SARS-CoV-2) nucleic acids may be present in the submitted sample  additional confirmatory testing may be necessary for epidemiological  and / or clinical management purposes  to differentiate between  SARS-CoV-2 and other Sarbecovirus currently known to infect humans.  If clinically indicated additional testing with an alternate test  methodology [id number]) is advised. The SARS-CoV-2 RNA is generally  detectable in upper and lower respiratory sp ecimens during the acute  phase of infection. The expected result is Negative. Fact Sheet for Patients:  StrictlyIdeas.no Fact Sheet for Healthcare Providers: BankingDealers.co.za This test  is not yet approved or cleared by the [place] FDA and has been authorized for detection and/or diagnosis of SARS-CoV-2 by FDA under an Emergency Use Authorization (EUA).  This EUA will remain in effect (meaning this test can be used) for the duration of the COVID-19 declaration under Section 564(b)(1) of the Act, 21 U.S.C. section 360bbb-3(b)(1), unless the authorization is terminated or revoked sooner. Performed at [facility], [place] [address], [place], [place] [address]   MRSA PCR Screening     Status: None   Collection Time: 08/29/18  9:19 PM   Specimen: Nasal Mucosa; Nasopharyngeal  Result Value Ref Range Status   MRSA by PCR NEGATIVE NEGATIVE Final    Comment:        The GeneXpert MRSA Assay (FDA approved for NASAL specimens only), is one component of a comprehensive MRSA colonization surveillance program. It is not intended to diagnose MRSA infection nor to guide or monitor treatment for MRSA infections. Performed at [facility] Lab, [place] [address], [place], [place] [address]      Labs: BNP (last 3 results) No results for input(s): BNP in the last 8760 hours. Basic Metabolic Panel: Recent Labs  Lab 08/29/18 1430 08/29/18 1711 08/29/18 1910 08/30/18 0256 08/31/18 0351  NA 144  --   --  144 140  K 4.0 2.8*  --  3.3* 3.4*  CL 107  --   --  108 105  CO2 25  --   --  25 24  GLUCOSE 95  --   --  159* 237*  BUN 11  --   --  9 15  CREATININE 0.69  --   --  0.67 0.80  CALCIUM 8.8*  --   --  8.5* 8.5*  MG 1.5*  --   --  1.7 1.7  PHOS  --   --  2.6  --   --    Liver Function Tests: Recent Labs  Lab 08/29/18 1430  AST 31  ALT 15  ALKPHOS 82  BILITOT 1.2  PROT 7.1  ALBUMIN 3.4*   No results for input(s): LIPASE, AMYLASE in the last 168 hours. No results for input(s): AMMONIA in the last 168 hours. CBC: Recent Labs  Lab 08/29/18 1230 08/30/18 0256 08/31/18 0351  WBC 9.9 9.5 9.2  NEUTROABS 6.9  --   --   HGB 13.2 12.6 12.1  HCT 41.5 38.2  36.9  MCV 88.3 86.2 86.2  PLT 305 300 327   Cardiac Enzymes: Recent Labs  Lab 08/29/18 1430  CKTOTAL 401*   BNP: Invalid input(s): POCBNP CBG: Recent Labs  Lab 09/01/18 0749 09/01/18 1159 09/01/18 1657 09/01/18 2052 09/02/18 0756  GLUCAP 155* 189* 246* 177* 175*   Urinalysis    Component Value Date/Time   COLORURINE YELLOW 08/29/2018 1215   APPEARANCEUR CLEAR 08/29/2018 1215   LABSPEC 1.008 08/29/2018 1215   PHURINE 6.0 08/29/2018 1215   GLUCOSEU NEGATIVE 08/29/2018 1215   GLUCOSEU NEGATIVE 08/31/2012 0836   HGBUR SMALL (A) 08/29/2018 1215   BILIRUBINUR NEGATIVE 08/29/2018 1215   KETONESUR NEGATIVE 08/29/2018 1215   PROTEINUR 100 (A) 08/29/2018 1215   UROBILINOGEN 0.2 05/31/2014 1402   NITRITE NEGATIVE 08/29/2018 1215   LEUKOCYTESUR TRACE (A) 08/29/2018 1215   Microbiology Recent Results (from the past 240 hour(s))  Urine culture     Status: None   Collection Time: 08/29/18 11:59 AM   Specimen: Urine, Random  Result Value Ref Range Status   Specimen Description URINE, RANDOM  Final   Special Requests NONE  Final   Culture   Final    NO GROWTH Performed at [facility], [place] [address] [place], [place] [address]    Report Status 08/30/2018 FINAL  Final  SARS Coronavirus 2 [facility] order, Performed in [facility] hospital lab) Nasopharyngeal Nasopharyngeal Swab     Status: None   Collection Time: 08/29/18  7:30 PM   Specimen: Nasopharyngeal Swab  Result Value Ref Range Status   SARS Coronavirus 2 NEGATIVE NEGATIVE Final    Comment: (NOTE) If result is NEGATIVE SARS-CoV-2 target nucleic acids are NOT DETECTED. The SARS-CoV-2 RNA is generally detectable in upper and lower  respiratory specimens during the acute phase of infection. The lowest  concentration of SARS-CoV-2 viral copies this assay can detect is 250  copies / mL. A negative result does not preclude SARS-CoV-2 infection  and should not be used as the sole basis for treatment or other   patient management decisions.  A negative result may occur with  improper specimen collection / handling, submission of specimen other  than nasopharyngeal swab, presence of viral mutation(s) within the  areas targeted by this assay, and inadequate number of viral copies  (<250 copies / mL). A negative result must be combined with clinical  observations, patient history, and epidemiological information. If result is POSITIVE SARS-CoV-2 target nucleic acids are DETECTED. The SARS-CoV-2 RNA is generally detectable in upper and lower  respiratory specimens dur ing the acute phase of infection.  Positive  results are indicative of active infection with SARS-CoV-2.  Clinical  correlation with patient history and other diagnostic information is  necessary to determine patient infection status.  Positive results do  not rule out bacterial infection or co-infection with other viruses. If result is PRESUMPTIVE POSTIVE SARS-CoV-2 nucleic acids MAY BE PRESENT.   A presumptive positive result was obtained on the submitted specimen  and confirmed on repeat testing.  While 2019 novel coronavirus  (SARS-CoV-2) nucleic acids may be present in the submitted sample  additional confirmatory testing may be  necessary for epidemiological  and / or clinical management purposes  to differentiate between  SARS-CoV-2 and other Sarbecovirus currently known to infect humans.  If clinically indicated additional testing with an alternate test  methodology [phone]) is advised. The SARS-CoV-2 RNA is generally  detectable in upper and lower respiratory sp ecimens during the acute  phase of infection. The expected result is Negative. Fact Sheet for Patients:  StrictlyIdeas.no Fact Sheet for Healthcare Providers: BankingDealers.co.za This test is not yet approved or cleared by the [place] FDA and has been authorized for detection and/or diagnosis of SARS-CoV-2  by FDA under an Emergency Use Authorization (EUA).  This EUA will remain in effect (meaning this test can be used) for the duration of the COVID-19 declaration under Section 564(b)(1) of the Act, 21 U.S.C. section 360bbb-3(b)(1), unless the authorization is terminated or revoked sooner. Performed at [facility], [place] [address], [place], [place] [address]   MRSA PCR Screening     Status: None   Collection Time: 08/29/18  9:19 PM   Specimen: Nasal Mucosa; Nasopharyngeal  Result Value Ref Range Status   MRSA by PCR NEGATIVE NEGATIVE Final    Comment:        The GeneXpert MRSA Assay (FDA approved for NASAL specimens only), is one component of a comprehensive MRSA colonization surveillance program. It is not intended to diagnose MRSA infection nor to guide or monitor treatment for MRSA infections. Performed at [facility], [place] [address], [place], [place] [address]    Time coordinating discharge: Over 30 minutes  SIGNED:  Little Ishikawa, DO [facility] 09/02/2018, 12:21 PM Pager   If 7PM-7AM, please contact night-coverage [email or web address] Password [email or web address]

## 2018-09-02 NOTE — Plan of Care (Signed)
  Problem: Education: Goal: Knowledge of General Education information will improve Description Including pain rating scale, medication(s)/side effects and non-pharmacologic comfort measures Outcome: Progressing   Problem: Clinical Measurements: Goal: Will remain free from infection Outcome: Progressing   Problem: Clinical Measurements: Goal: Cardiovascular complication will be avoided Outcome: Progressing   

## 2018-09-02 NOTE — TOC Transition Note (Signed)
Transition of Care [facility]) - CM/SW Discharge Note   Patient Details  Name: Leslie Gamble MRN: [id number] Date of Birth: [birth year]  [facility] CM/SW Contact:  Marilu Favre, RN Phone Number: 09/02/2018, 12:59 PM   Clinical Narrative:     Confirmed face sheet information. Patient from home with son and another son lives close by.   Final next level of care: [place] Barriers to Discharge: No Barriers Identified   Patient Goals and CMS Choice Patient states their goals for this hospitalization and ongoing recovery are:: to go home CMS Medicare.gov Compare Post Acute Care list provided to:: Patient Choice offered to / list presented to : Patient  Discharge Placement                       Discharge Plan and Services   Discharge Planning Services: CM Consult Post Acute Care Choice: Home Health          DME Arranged: Walker rolling DME Agency: [employer] Date DME Agency Contacted: 09/02/18 Time DME Agency Contacted: 48 Representative spoke with at DME Agency: [place]: PT [place]: [place] Date [place]: 09/02/18 Time [place]: 1258 Representative spoke with at [place]: [place] ([place]) Interventions     Readmission Risk Interventions No flowsheet data found.

## 2018-09-02 NOTE — Progress Notes (Addendum)
Physical Therapy Treatment Patient Details Name: Leslie Gamble MRN: [id number] DOB: 07-21-1940 Today's Date: 09/02/2018    History of Present Illness 78 yo female with onset of sliding off bed was brought to hosp, noted R knee effusion and mildly displaced avulsion fractures to R L knee on medial tibial spine, intercondylar notch.  Pt complains of lumbar spine pain but has spondylosis.  PMHx:  AKI, OA, asthma, depression, DM, anxiety, HTN, aortic atherosclerosis, R frontal infarct, DDD cervical spine, anterolisthesis L4 on L5, with lumbar spondylosis, RA    PT Comments    Pt performed gt training and functional mobility with supervision.  She is progressing well.  She remains limited in confidence due to her anxiety.  She is able to negotiate x 4 stairs with unilateral rail support.  Plan  Updated for return home with support from family and supplemental HHPT.  Son and patient are in agreement.  Will inform supervising PT of need for change in recommendations at this time.      Follow Up Recommendations  Home health PT     Equipment Recommendations  Rolling walker with 5" wheels    Recommendations for Other Services       Precautions / Restrictions Precautions Precautions: Fall Restrictions Weight Bearing Restrictions: No    Mobility  Bed Mobility Overal bed mobility: Needs Assistance Bed Mobility: Supine to Sit     Supine to sit: Modified independent (Device/Increase time)        Transfers Overall transfer level: Needs assistance Equipment used: Rolling walker (2 wheeled) Transfers: Sit to/from Stand Sit to Stand: Supervision         General transfer comment: Cues for hand placement and to scoot forward to edge of bed to achieve standing.  Ambulation/Gait Ambulation/Gait assistance: Supervision Gait Distance (Feet): 200 Feet Assistive device: Rolling walker (2 wheeled) Gait Pattern/deviations: Step-through pattern;Decreased stride length;Wide base of  support;Trunk flexed     General Gait Details: Cues for upper trunk control and cues to stay closer to RW to improve safety.   Stairs Stairs: Yes Stairs assistance: Supervision Stair Management: One rail Right;Forwards Number of Stairs: 4 General stair comments: Cues for sequencing and use of rail.   Wheelchair Mobility    Modified Rankin (Stroke Patients Only)       Balance Overall balance assessment: Needs assistance;History of Falls Sitting-balance support: Feet supported Sitting balance-Leahy Scale: Normal     Standing balance support: Single extremity supported Standing balance-Leahy Scale: Good                              Cognition Arousal/Alertness: Awake/alert Behavior During Therapy: WFL for tasks assessed/performed Overall Cognitive Status: Within Functional Limits for tasks assessed                                        Exercises General Exercises - Lower Extremity Ankle Circles/Pumps: AROM;Both;20 reps;Supine Quad Sets: AROM;Both;10 reps;Supine Heel Slides: AROM;Both;10 reps;Supine;AAROM Hip ABduction/ADduction: AROM;Both;10 reps;Supine Straight Leg Raises: AAROM;Both;10 reps;Supine    General Comments        Pertinent Vitals/Pain      Home Living                      Prior Function            PT Goals (current goals can now be  found in the care plan section) Acute Rehab PT Goals Patient Stated Goal: to get stronger Potential to Achieve Goals: Good Progress towards PT goals: Progressing toward goals    Frequency    Min 3X/week      PT Plan Discharge plan needs to be updated    Co-evaluation              AM-PAC PT "6 Clicks" Mobility   Outcome Measure  Help needed turning from your back to your side while in a flat bed without using bedrails?: None Help needed moving from lying on your back to sitting on the side of a flat bed without using bedrails?: None Help needed moving to  and from a bed to a chair (including a wheelchair)?: A Little Help needed standing up from a chair using your arms (e.g., wheelchair or bedside chair)?: A Little Help needed to walk in hospital room?: A Little Help needed climbing 3-5 steps with a railing? : A Little 6 Click Score: 20    End of Session Equipment Utilized During Treatment: Gait belt Activity Tolerance: Patient tolerated treatment well Patient left: in bed;with family/visitor present;with call bell/phone within reach Nurse Communication: Mobility status PT Visit Diagnosis: Unsteadiness on feet (R26.81);Muscle weakness (generalized) (M62.81);Difficulty in walking, not elsewhere classified (R26.2);History of falling (Z91.81);Other abnormalities of gait and mobility (R26.89)     Time: 6789-3810 PT Time Calculation (min) (ACUTE ONLY): 38 min  Charges:  $Gait Training: 8-22 mins $Therapeutic Exercise: 8-22 mins $Therapeutic Activity: 8-22 mins                     Governor Rooks, PTA Acute Rehabilitation Services Pager [phone] Office [phone]     Jakiera Ehler Eli Hose 09/02/2018, 12:21 PM

## 2018-09-04 DIAGNOSIS — D630 Anemia in neoplastic disease: Secondary | ICD-10-CM | POA: Diagnosis not present

## 2018-09-04 DIAGNOSIS — F322 Major depressive disorder, single episode, severe without psychotic features: Secondary | ICD-10-CM | POA: Diagnosis not present

## 2018-09-04 DIAGNOSIS — E119 Type 2 diabetes mellitus without complications: Secondary | ICD-10-CM | POA: Diagnosis not present

## 2018-09-04 DIAGNOSIS — K922 Gastrointestinal hemorrhage, unspecified: Secondary | ICD-10-CM | POA: Diagnosis not present

## 2018-09-04 DIAGNOSIS — M4312 Spondylolisthesis, cervical region: Secondary | ICD-10-CM | POA: Diagnosis not present

## 2018-09-04 DIAGNOSIS — J439 Emphysema, unspecified: Secondary | ICD-10-CM | POA: Diagnosis not present

## 2018-09-04 DIAGNOSIS — E669 Obesity, unspecified: Secondary | ICD-10-CM | POA: Diagnosis not present

## 2018-09-04 DIAGNOSIS — K5790 Diverticulosis of intestine, part unspecified, without perforation or abscess without bleeding: Secondary | ICD-10-CM | POA: Diagnosis not present

## 2018-09-04 DIAGNOSIS — G319 Degenerative disease of nervous system, unspecified: Secondary | ICD-10-CM | POA: Diagnosis not present

## 2018-09-04 DIAGNOSIS — E876 Hypokalemia: Secondary | ICD-10-CM | POA: Diagnosis not present

## 2018-09-04 DIAGNOSIS — I10 Essential (primary) hypertension: Secondary | ICD-10-CM | POA: Diagnosis not present

## 2018-09-04 DIAGNOSIS — F411 Generalized anxiety disorder: Secondary | ICD-10-CM | POA: Diagnosis not present

## 2018-09-04 DIAGNOSIS — M160 Bilateral primary osteoarthritis of hip: Secondary | ICD-10-CM | POA: Diagnosis not present

## 2018-09-04 DIAGNOSIS — M069 Rheumatoid arthritis, unspecified: Secondary | ICD-10-CM | POA: Diagnosis not present

## 2018-09-04 DIAGNOSIS — E785 Hyperlipidemia, unspecified: Secondary | ICD-10-CM | POA: Diagnosis not present

## 2018-09-04 DIAGNOSIS — M5030 Other cervical disc degeneration, unspecified cervical region: Secondary | ICD-10-CM | POA: Diagnosis not present

## 2018-09-04 DIAGNOSIS — F17200 Nicotine dependence, unspecified, uncomplicated: Secondary | ICD-10-CM | POA: Diagnosis not present

## 2018-09-04 DIAGNOSIS — I6782 Cerebral ischemia: Secondary | ICD-10-CM | POA: Diagnosis not present

## 2018-09-04 DIAGNOSIS — M1712 Unilateral primary osteoarthritis, left knee: Secondary | ICD-10-CM | POA: Diagnosis not present

## 2018-09-04 DIAGNOSIS — J45909 Unspecified asthma, uncomplicated: Secondary | ICD-10-CM | POA: Diagnosis not present

## 2018-09-04 DIAGNOSIS — M4316 Spondylolisthesis, lumbar region: Secondary | ICD-10-CM | POA: Diagnosis not present

## 2018-09-04 DIAGNOSIS — K921 Melena: Secondary | ICD-10-CM | POA: Diagnosis not present

## 2018-09-04 DIAGNOSIS — M47816 Spondylosis without myelopathy or radiculopathy, lumbar region: Secondary | ICD-10-CM | POA: Diagnosis not present

## 2018-09-04 DIAGNOSIS — M25462 Effusion, left knee: Secondary | ICD-10-CM | POA: Diagnosis not present

## 2018-09-04 DIAGNOSIS — D473 Essential (hemorrhagic) thrombocythemia: Secondary | ICD-10-CM | POA: Diagnosis not present

## 2018-09-05 DIAGNOSIS — M25462 Effusion, left knee: Secondary | ICD-10-CM | POA: Diagnosis not present

## 2018-09-05 DIAGNOSIS — I10 Essential (primary) hypertension: Secondary | ICD-10-CM | POA: Diagnosis not present

## 2018-09-05 DIAGNOSIS — M160 Bilateral primary osteoarthritis of hip: Secondary | ICD-10-CM | POA: Diagnosis not present

## 2018-09-05 DIAGNOSIS — M4316 Spondylolisthesis, lumbar region: Secondary | ICD-10-CM | POA: Diagnosis not present

## 2018-09-05 DIAGNOSIS — M1712 Unilateral primary osteoarthritis, left knee: Secondary | ICD-10-CM | POA: Diagnosis not present

## 2018-09-05 DIAGNOSIS — E119 Type 2 diabetes mellitus without complications: Secondary | ICD-10-CM | POA: Diagnosis not present

## 2018-09-07 DIAGNOSIS — M160 Bilateral primary osteoarthritis of hip: Secondary | ICD-10-CM | POA: Diagnosis not present

## 2018-09-07 DIAGNOSIS — E119 Type 2 diabetes mellitus without complications: Secondary | ICD-10-CM | POA: Diagnosis not present

## 2018-09-07 DIAGNOSIS — M25462 Effusion, left knee: Secondary | ICD-10-CM | POA: Diagnosis not present

## 2018-09-07 DIAGNOSIS — I10 Essential (primary) hypertension: Secondary | ICD-10-CM | POA: Diagnosis not present

## 2018-09-07 DIAGNOSIS — M1712 Unilateral primary osteoarthritis, left knee: Secondary | ICD-10-CM | POA: Diagnosis not present

## 2018-09-07 DIAGNOSIS — M4316 Spondylolisthesis, lumbar region: Secondary | ICD-10-CM | POA: Diagnosis not present

## 2018-09-08 DIAGNOSIS — E1140 Type 2 diabetes mellitus with diabetic neuropathy, unspecified: Secondary | ICD-10-CM | POA: Diagnosis not present

## 2018-09-08 DIAGNOSIS — R531 Weakness: Secondary | ICD-10-CM | POA: Diagnosis not present

## 2018-09-08 DIAGNOSIS — E876 Hypokalemia: Secondary | ICD-10-CM | POA: Diagnosis not present

## 2018-09-12 DIAGNOSIS — M1712 Unilateral primary osteoarthritis, left knee: Secondary | ICD-10-CM | POA: Diagnosis not present

## 2018-09-12 DIAGNOSIS — M25462 Effusion, left knee: Secondary | ICD-10-CM | POA: Diagnosis not present

## 2018-09-12 DIAGNOSIS — M4316 Spondylolisthesis, lumbar region: Secondary | ICD-10-CM | POA: Diagnosis not present

## 2018-09-12 DIAGNOSIS — M160 Bilateral primary osteoarthritis of hip: Secondary | ICD-10-CM | POA: Diagnosis not present

## 2018-09-12 DIAGNOSIS — I10 Essential (primary) hypertension: Secondary | ICD-10-CM | POA: Diagnosis not present

## 2018-09-12 DIAGNOSIS — E119 Type 2 diabetes mellitus without complications: Secondary | ICD-10-CM | POA: Diagnosis not present

## 2018-09-15 DIAGNOSIS — R531 Weakness: Secondary | ICD-10-CM | POA: Diagnosis not present

## 2018-09-20 DIAGNOSIS — M25462 Effusion, left knee: Secondary | ICD-10-CM | POA: Diagnosis not present

## 2018-09-20 DIAGNOSIS — I10 Essential (primary) hypertension: Secondary | ICD-10-CM | POA: Diagnosis not present

## 2018-09-20 DIAGNOSIS — M4316 Spondylolisthesis, lumbar region: Secondary | ICD-10-CM | POA: Diagnosis not present

## 2018-09-20 DIAGNOSIS — M160 Bilateral primary osteoarthritis of hip: Secondary | ICD-10-CM | POA: Diagnosis not present

## 2018-09-20 DIAGNOSIS — E119 Type 2 diabetes mellitus without complications: Secondary | ICD-10-CM | POA: Diagnosis not present

## 2018-09-20 DIAGNOSIS — M1712 Unilateral primary osteoarthritis, left knee: Secondary | ICD-10-CM | POA: Diagnosis not present

## 2018-09-21 ENCOUNTER — Encounter (HOSPITAL_COMMUNITY): Payer: Self-pay | Admitting: Psychiatry

## 2018-09-21 ENCOUNTER — Ambulatory Visit (INDEPENDENT_AMBULATORY_CARE_PROVIDER_SITE_OTHER): Payer: Medicare Other | Admitting: Psychiatry

## 2018-09-21 ENCOUNTER — Other Ambulatory Visit: Payer: Self-pay

## 2018-09-21 DIAGNOSIS — F411 Generalized anxiety disorder: Secondary | ICD-10-CM

## 2018-09-21 DIAGNOSIS — F331 Major depressive disorder, recurrent, moderate: Secondary | ICD-10-CM

## 2018-09-21 MED ORDER — DULOXETINE HCL 60 MG PO CPEP: 60.0000 mg | ORAL_CAPSULE | Freq: Two times a day (BID) | ORAL | 1 refills | Status: DC

## 2018-09-21 MED ORDER — HYDROXYZINE HCL 10 MG PO TABS: 10.0000 mg | ORAL_TABLET | Freq: Every day | ORAL | 1 refills | Status: DC | PRN

## 2018-09-21 MED ORDER — MIRTAZAPINE 30 MG PO TABS: 30.0000 mg | ORAL_TABLET | Freq: Every day | ORAL | 0 refills | Status: DC

## 2018-09-21 NOTE — Progress Notes (Signed)
Virtual Visit via Telephone Note  I connected with Leslie Gamble on 09/21/18 at  1:00 PM EDT by telephone and verified that I am speaking with the correct person using two identifiers.   I discussed the limitations, risks, security and privacy concerns of performing an evaluation and management service by telephone and the availability of in person appointments. I also discussed with the patient that there may be a patient responsible charge related to this service. The patient expressed understanding and agreed to proceed.   History of Present Illness: Patient was evaluated by phone session.  She was recently admitted to the hospital because of weakness and low potassium.  She is slowly gradually recovering.  She endorsed increased anxiety and nervousness.  She was given Xanax by Dr. Deforest Hoyles in June but she is almost out.  Now she is taking Cymbalta and Remeron on a regular basis.  She admitted having issues with her son who lives with the patient.  Patient told that he does not help her a lot.  Patient has mild cognitive impairment and sometimes she does not remember very well.  She takes her medication and her last hemoglobin A1c is improved to 6.3 from 7.3.  She denies any agitation, anger, mania or any psychosis.  She did not schedule appointment with Janett Billow as she forgot again.  Now she decided to take some time from therapy as she like to more focus on her general health.  She admitted more nervousness and anxiety during the day.  Some nights she has difficulty sleeping.  She takes a lot of medication including narcotic pain medication.  She denies drinking or using any illegal substances.  Her appetite is fair.  Her energy level is fair.  She denies any major panic attack.  She denies any feeling of hopelessness or any suicidal thoughts.  Past Psychiatric History:Reviewed. H/Oinpatient treatment in1995afterhusband died.No h/osuicidal attemptandpsychosis. Had tried Prozac, Paxil,  Zoloft, trazodone, Xanax but do notrecalldetails.Tried lithium butdevelopedtoxicity and requiredhospitalization on the medical floor.  Recent Results (from the past 2160 hour(s))  CBG monitoring, ED     Status: Abnormal   Collection Time: 08/29/18 10:51 AM  Result Value Ref Range   Glucose-Capillary 177 (H) 70 - 99 mg/dL   Comment 1 Notify RN    Comment 2 Document in Chart   Urine culture     Status: None   Collection Time: 08/29/18 11:59 AM   Specimen: Urine, Random  Result Value Ref Range   Specimen Description URINE, RANDOM    Special Requests NONE    Culture      NO GROWTH Performed at [facility], [address], [place], [place] [address]    Report Status 08/30/2018 FINAL   Urinalysis, Routine w reflex microscopic     Status: Abnormal   Collection Time: 08/29/18 12:15 PM  Result Value Ref Range   Color, Urine YELLOW YELLOW   APPearance CLEAR CLEAR   Specific Gravity, Urine 1.008 1.005 - 1.030   pH 6.0 5.0 - 8.0   Glucose, UA NEGATIVE NEGATIVE mg/dL   Hgb urine dipstick SMALL (A) NEGATIVE   Bilirubin Urine NEGATIVE NEGATIVE   Ketones, ur NEGATIVE NEGATIVE mg/dL   Protein, ur 100 (A) NEGATIVE mg/dL   Nitrite NEGATIVE NEGATIVE   Leukocytes,Ua TRACE (A) NEGATIVE   RBC / HPF 0-5 0 - 5 RBC/hpf   WBC, UA 0-5 0 - 5 WBC/hpf   Bacteria, UA NONE SEEN NONE SEEN   Squamous Epithelial / LPF 0-5  0 - 5    Comment: Performed at [facility], [place] [address] [place], [place] [address]  Urine rapid drug screen (hosp performed)     Status: Abnormal   Collection Time: 08/29/18 12:15 PM  Result Value Ref Range   Opiates NONE DETECTED NONE DETECTED   Cocaine NONE DETECTED NONE DETECTED   Benzodiazepines POSITIVE (A) NONE DETECTED   Amphetamines NONE DETECTED NONE DETECTED   Tetrahydrocannabinol NONE DETECTED NONE DETECTED   Barbiturates NONE DETECTED NONE DETECTED    Comment: (NOTE) DRUG SCREEN FOR MEDICAL PURPOSES ONLY.  IF CONFIRMATION IS NEEDED FOR ANY PURPOSE,  NOTIFY LAB WITHIN 5 DAYS. LOWEST DETECTABLE LIMITS FOR URINE DRUG SCREEN Drug Class                     Cutoff (ng/mL) Amphetamine and metabolites    1000 Barbiturate and metabolites    200 Benzodiazepine                 341 Tricyclics and metabolites     300 Opiates and metabolites        300 Cocaine and metabolites        300 THC                            50 Performed at [facility], [place] [address], [place], [place] [address]   CBC with Differential     Status: None   Collection Time: 08/29/18 12:30 PM  Result Value Ref Range   WBC 9.9 4.0 - 10.5 K/uL   RBC 4.70 3.87 - 5.11 MIL/uL   Hemoglobin 13.2 12.0 - 15.0 g/dL   HCT 41.5 36.0 - 46.0 %   MCV 88.3 80.0 - 100.0 fL   MCH 28.1 26.0 - 34.0 pg   MCHC 31.8 30.0 - 36.0 g/dL   RDW 14.4 11.5 - 15.5 %   Platelets 305 150 - 400 K/uL   nRBC 0.0 0.0 - 0.2 %   Neutrophils Relative % 69 %   Neutro Abs 6.9 1.7 - 7.7 K/uL   Lymphocytes Relative 23 %   Lymphs Abs 2.3 0.7 - 4.0 K/uL   Monocytes Relative 7 %   Monocytes Absolute 0.7 0.1 - 1.0 K/uL   Eosinophils Relative 0 %   Eosinophils Absolute 0.0 0.0 - 0.5 K/uL   Basophils Relative 1 %   Basophils Absolute 0.1 0.0 - 0.1 K/uL   Immature Granulocytes 0 %   Abs Immature Granulocytes 0.02 0.00 - 0.07 K/uL    Comment: Performed at [facility], [address], [place], [place] [address]  Lactic acid, plasma     Status: Abnormal   Collection Time: 08/29/18 12:57 PM  Result Value Ref Range   Lactic Acid, Venous 2.4 (HH) 0.5 - 1.9 mmol/L    Comment: CRITICAL RESULT CALLED TO, READ BACK BY AND VERIFIED WITH: COFFEY,M RN @ 2119 08/29/18 LEONARD,A Performed at [facility], [place] [address] [place], [place] [address]   CK     Status: Abnormal   Collection Time: 08/29/18  2:30 PM  Result Value Ref Range   Total CK 401 (H) 38 - 234 U/L    Comment: Performed at [facility], [place] [address] [place],  [address]  Troponin I (High Sensitivity)     Status: None    Collection Time: 08/29/18  2:30 PM  Result Value Ref Range  Troponin I (High Sensitivity) 13 <18 ng/L    Comment: (NOTE) Elevated high sensitivity troponin I (hsTnI) values and significant  changes across serial measurements may suggest ACS but many other  chronic and acute conditions are known to elevate hsTnI results.  Refer to the Links section for chest pain algorithms and additional  guidance. Performed at [facility], [place] [address], [place], [place] [address]   Magnesium     Status: Abnormal   Collection Time: 08/29/18  2:30 PM  Result Value Ref Range   Magnesium 1.5 (L) 1.7 - 2.4 mg/dL    Comment: Performed at [facility] [address], [place], [place] [address]  Comprehensive metabolic panel     Status: Abnormal   Collection Time: 08/29/18  2:30 PM  Result Value Ref Range   Sodium 144 135 - 145 mmol/L   Potassium 4.0 3.5 - 5.1 mmol/L    Comment: HEMOLYSIS AT THIS LEVEL MAY AFFECT RESULT   Chloride 107 98 - 111 mmol/L   CO2 25 22 - 32 mmol/L   Glucose, Bld 95 70 - 99 mg/dL   BUN 11 8 - 23 mg/dL   Creatinine, Ser 0.69 0.44 - 1.00 mg/dL   Calcium 8.8 (L) 8.9 - 10.3 mg/dL   Total Protein 7.1 6.5 - 8.1 g/dL   Albumin 3.4 (L) 3.5 - 5.0 g/dL   AST 31 15 - 41 U/L   ALT 15 0 - 44 U/L   Alkaline Phosphatase 82 38 - 126 U/L   Total Bilirubin 1.2 0.3 - 1.2 mg/dL   GFR calc non Af Amer >60 >60 mL/min   GFR calc Af Amer >60 >60 mL/min   Anion gap 12 5 - 15    Comment: Performed at [facility], [place] [address] [place], [place] [address]  Lactic acid, plasma     Status: Abnormal   Collection Time: 08/29/18  5:11 PM  Result Value Ref Range   Lactic Acid, Venous 2.0 (HH) 0.5 - 1.9 mmol/L    Comment: CRITICAL RESULT CALLED TO, READ BACK BY AND VERIFIED WITH: Weber Cooks  7564 08/29/2018 WBOND Performed at [facility], [place] [address] [place], [place] [address]   Troponin I (High Sensitivity)     Status: None   Collection Time: 08/29/18  5:11 PM   Result Value Ref Range   Troponin I (High Sensitivity) 15 <18 ng/L    Comment: (NOTE) Elevated high sensitivity troponin I (hsTnI) values and significant  changes across serial measurements may suggest ACS but many other  chronic and acute conditions are known to elevate hsTnI results.  Refer to the Links section for chest pain algorithms and additional  guidance. Performed at [facility], [place] [address] [place], [place] [address]   Potassium     Status: Abnormal   Collection Time: 08/29/18  5:11 PM  Result Value Ref Range   Potassium 2.8 (L) 3.5 - 5.1 mmol/L    Comment: Performed at [facility] [address] [place], [place] [address]  Hemoglobin A1c     Status: Abnormal   Collection Time: 08/29/18  6:58 PM  Result Value Ref Range   Hgb A1c MFr Bld 6.3 (H) 4.8 - 5.6 %    Comment: (NOTE) Pre diabetes:          5.7%-6.4% Diabetes:              >6.4% Glycemic control for   <7.0%  adults with diabetes    Mean Plasma Glucose 134.11 mg/dL    Comment: Performed at [facility] [address] [place], [place] [address]  Phosphorus     Status: None   Collection Time: 08/29/18  7:10 PM  Result Value Ref Range   Phosphorus 2.6 2.5 - 4.6 mg/dL    Comment: Performed at [facility], [place] [address], [place], [place] [address]  SARS Coronavirus 2 [facility] order, Performed in [facility] hospital lab) Nasopharyngeal Nasopharyngeal Swab     Status: None   Collection Time: 08/29/18  7:30 PM   Specimen: Nasopharyngeal Swab  Result Value Ref Range   SARS Coronavirus 2 NEGATIVE NEGATIVE    Comment: (NOTE) If result is NEGATIVE SARS-CoV-2 target nucleic acids are NOT DETECTED. The SARS-CoV-2 RNA is generally detectable in upper and lower  respiratory specimens during the acute phase of infection. The lowest  concentration of SARS-CoV-2 viral copies this assay can detect is 250  copies / mL. A negative result does not preclude SARS-CoV-2 infection  and should  not be used as the sole basis for treatment or other  patient management decisions.  A negative result may occur with  improper specimen collection / handling, submission of specimen other  than nasopharyngeal swab, presence of viral mutation(s) within the  areas targeted by this assay, and inadequate number of viral copies  (<250 copies / mL). A negative result must be combined with clinical  observations, patient history, and epidemiological information. If result is POSITIVE SARS-CoV-2 target nucleic acids are DETECTED. The SARS-CoV-2 RNA is generally detectable in upper and lower  respiratory specimens dur ing the acute phase of infection.  Positive  results are indicative of active infection with SARS-CoV-2.  Clinical  correlation with patient history and other diagnostic information is  necessary to determine patient infection status.  Positive results do  not rule out bacterial infection or co-infection with other viruses. If result is PRESUMPTIVE POSTIVE SARS-CoV-2 nucleic acids MAY BE PRESENT.   A presumptive positive result was obtained on the submitted specimen  and confirmed on repeat testing.  While 2019 novel coronavirus  (SARS-CoV-2) nucleic acids may be present in the submitted sample  additional confirmatory testing may be necessary for epidemiological  and / or clinical management purposes  to differentiate between  SARS-CoV-2 and other Sarbecovirus currently known to infect humans.  If clinically indicated additional testing with an alternate test  methodology [phone]) is advised. The SARS-CoV-2 RNA is generally  detectable in upper and lower respiratory sp ecimens during the acute  phase of infection. The expected result is Negative. Fact Sheet for Patients:  StrictlyIdeas.no Fact Sheet for Healthcare Providers: BankingDealers.co.za This test is not yet approved or cleared by the [place] FDA and has been  authorized for detection and/or diagnosis of SARS-CoV-2 by FDA under an Emergency Use Authorization (EUA).  This EUA will remain in effect (meaning this test can be used) for the duration of the COVID-19 declaration under Section 564(b)(1) of the Act, 21 U.S.C. section 360bbb-3(b)(1), unless the authorization is terminated or revoked sooner. Performed at [facility], [place] [address] [place], [place] [address]   TSH     Status: None   Collection Time: 08/29/18  8:33 PM  Result Value Ref Range   TSH 0.457 0.350 - 4.500 uIU/mL    Comment: Performed by a 3rd Generation assay with a functional sensitivity of <=0.01 uIU/mL. Performed at [facility], [place]  [address], [place], [place] [address]   MRSA PCR Screening     Status: None   Collection Time: 08/29/18  9:19 PM   Specimen: Nasal Mucosa; Nasopharyngeal  Result Value Ref Range   MRSA by PCR NEGATIVE NEGATIVE    Comment:        The GeneXpert MRSA Assay (FDA approved for NASAL specimens only), is one component of a comprehensive MRSA colonization surveillance program. It is not intended to diagnose MRSA infection nor to guide or monitor treatment for MRSA infections. Performed at [facility], [place] [address] [place], [place] [address]   Glucose, capillary     Status: Abnormal   Collection Time: 08/29/18  9:52 PM  Result Value Ref Range   Glucose-Capillary 226 (H) 70 - 99 mg/dL  Basic metabolic panel     Status: Abnormal   Collection Time: 08/30/18  2:56 AM  Result Value Ref Range   Sodium 144 135 - 145 mmol/L   Potassium 3.3 (L) 3.5 - 5.1 mmol/L   Chloride 108 98 - 111 mmol/L   CO2 25 22 - 32 mmol/L   Glucose, Bld 159 (H) 70 - 99 mg/dL   BUN 9 8 - 23 mg/dL   Creatinine, Ser 0.67 0.44 - 1.00 mg/dL   Calcium 8.5 (L) 8.9 - 10.3 mg/dL   GFR calc non Af Amer >60 >60 mL/min   GFR calc Af Amer >60 >60 mL/min   Anion gap 11 5 - 15    Comment: Performed at [facility], [place] [address], [place],  [place] [address]  CBC     Status: None   Collection Time: 08/30/18  2:56 AM  Result Value Ref Range   WBC 9.5 4.0 - 10.5 K/uL   RBC 4.43 3.87 - 5.11 MIL/uL   Hemoglobin 12.6 12.0 - 15.0 g/dL   HCT 38.2 36.0 - 46.0 %   MCV 86.2 80.0 - 100.0 fL   MCH 28.4 26.0 - 34.0 pg   MCHC 33.0 30.0 - 36.0 g/dL   RDW 14.2 11.5 - 15.5 %   Platelets 300 150 - 400 K/uL   nRBC 0.0 0.0 - 0.2 %    Comment: Performed at [facility], [place] [address] [place], [place] [address]  Magnesium     Status: None   Collection Time: 08/30/18  2:56 AM  Result Value Ref Range   Magnesium 1.7 1.7 - 2.4 mg/dL    Comment: Performed at [place] [address], [place], [place] [address]  Glucose, capillary     Status: Abnormal   Collection Time: 08/30/18  7:35 AM  Result Value Ref Range   Glucose-Capillary 112 (H) 70 - 99 mg/dL  Glucose, capillary     Status: Abnormal   Collection Time: 08/30/18 11:33 AM  Result Value Ref Range   Glucose-Capillary 231 (H) 70 - 99 mg/dL  Glucose, capillary     Status: Abnormal   Collection Time: 08/30/18  4:25 PM  Result Value Ref Range   Glucose-Capillary 218 (H) 70 - 99 mg/dL  Glucose, capillary     Status: Abnormal   Collection Time: 08/30/18  9:51 PM  Result Value Ref Range   Glucose-Capillary 183 (H) 70 - 99 mg/dL  Magnesium     Status: None   Collection Time: 08/31/18  3:51 AM  Result Value Ref Range   Magnesium 1.7 1.7 - 2.4 mg/dL    Comment: Performed at [facility], [place]  [address] [place], [place] [address]  Basic metabolic panel     Status: Abnormal   Collection Time: 08/31/18  3:51 AM  Result Value Ref Range   Sodium 140 135 - 145 mmol/L   Potassium 3.4 (L) 3.5 - 5.1 mmol/L   Chloride 105 98 - 111 mmol/L   CO2 24 22 - 32 mmol/L   Glucose, Bld 237 (H) 70 - 99 mg/dL   BUN 15 8 - 23 mg/dL   Creatinine, Ser 0.80 0.44 - 1.00 mg/dL   Calcium 8.5 (L) 8.9 - 10.3 mg/dL   GFR calc non Af Amer >60 >60 mL/min   GFR calc Af Amer >60 >60 mL/min   Anion  gap 11 5 - 15    Comment: Performed at [facility], [place] [address] [place] [address]  CBC     Status: None   Collection Time: 08/31/18  3:51 AM  Result Value Ref Range   WBC 9.2 4.0 - 10.5 K/uL   RBC 4.28 3.87 - 5.11 MIL/uL   Hemoglobin 12.1 12.0 - 15.0 g/dL   HCT 36.9 36.0 - 46.0 %   MCV 86.2 80.0 - 100.0 fL   MCH 28.3 26.0 - 34.0 pg   MCHC 32.8 30.0 - 36.0 g/dL   RDW 14.3 11.5 - 15.5 %   Platelets 327 150 - 400 K/uL   nRBC 0.0 0.0 - 0.2 %    Comment: Performed at [facility], [place] [address], [place], [place] [address]  Glucose, capillary     Status: Abnormal   Collection Time: 08/31/18  7:40 AM  Result Value Ref Range   Glucose-Capillary 196 (H) 70 - 99 mg/dL  Glucose, capillary     Status: Abnormal   Collection Time: 08/31/18 12:38 PM  Result Value Ref Range   Glucose-Capillary 282 (H) 70 - 99 mg/dL  Glucose, capillary     Status: Abnormal   Collection Time: 08/31/18  4:48 PM  Result Value Ref Range   Glucose-Capillary 217 (H) 70 - 99 mg/dL  Glucose, capillary     Status: Abnormal   Collection Time: 08/31/18  8:41 PM  Result Value Ref Range   Glucose-Capillary 254 (H) 70 - 99 mg/dL  Glucose, capillary     Status: Abnormal   Collection Time: 09/01/18  7:49 AM  Result Value Ref Range   Glucose-Capillary 155 (H) 70 - 99 mg/dL  Glucose, capillary     Status: Abnormal   Collection Time: 09/01/18 11:59 AM  Result Value Ref Range   Glucose-Capillary 189 (H) 70 - 99 mg/dL  Glucose, capillary     Status: Abnormal   Collection Time: 09/01/18  4:57 PM  Result Value Ref Range   Glucose-Capillary 246 (H) 70 - 99 mg/dL  Glucose, capillary     Status: Abnormal   Collection Time: 09/01/18  8:52 PM  Result Value Ref Range   Glucose-Capillary 177 (H) 70 - 99 mg/dL  Glucose, capillary     Status: Abnormal   Collection Time: 09/02/18  7:56 AM  Result Value Ref Range   Glucose-Capillary 175 (H) 70 - 99 mg/dL  Glucose, capillary     Status: Abnormal    Collection Time: 09/02/18  4:21 PM  Result Value Ref Range   Glucose-Capillary 316 (H) 70 - 99 mg/dL      Psychiatric Specialty Exam: Physical Exam  ROS  There were no vitals taken for this visit.There is no height or weight on file to calculate BMI.  General Appearance: NA  Eye Contact:  NA  Speech:  Slow  Volume:  Decreased  Mood:  Dysphoric  Affect:  NA  Thought Process:  Goal Directed  Orientation:  Full (Time, Place, and Person)  Thought Content:  WDL  Suicidal Thoughts:  No  Homicidal Thoughts:  No  Memory:  Immediate;   Fair Recent;   Fair Remote;   Fair  Judgement:  Good  Insight:  Good  Psychomotor Activity:  NA  Concentration:  Concentration: Fair and Attention Span: Fair  Recall:  [employer] of Knowledge:  Fair  Language:  Good  Akathisia:  No  Handed:  Right  AIMS (if indicated):     Assets:  Communication Skills Desire for Improvement Housing Resilience  ADL's:  Intact  Cognition:  Impaired,  Mild  Sleep:   fair      Assessment and Plan: Major depressive disorder, recurrent.  Generalized anxiety disorder.  Mild cognitive impairment.  Review her medication and her blood work results.  Her hemoglobin A1c is improved.  She is still recovering from recent hospitalization due to extreme weakness, dizziness and low potassium.  She feels very anxious and nervous.  I recommend to try low-dose hydroxyzine 10 mg to take as needed.  I also recommend to reduce Cymbalta 60 mg to see if higher Cymbalta dose is making her more nervous and anxious.  Patient has a history of noncompliance with Cymbalta but now she is taking 120 mg every day which could be contributing to her anxiety.  Patient is scared to cut down the dose but willing to try.  I suggested that if she feels having anxiety coming back and withdrawal symptoms then she can go back to 120 mg Cymbalta every day.  She will continue Remeron 30 mg at bedtime.  I recommend to stop the Xanax due to dependency and  taking multiple medication including narcotics.  She agreed with the plan.  I also discussed hydroxyzine low-dose to help her anxiety and discussed potential side effects with antihistamine.  Patient does not want therapy at this point.  Recommended to call us back if she is any question or any concern.  Follow-up in 2 months.  Time spent 30 minutes.  More than 50% of the time spent in psychoeducation, counseling, coronation of care and discussing long term prognosis.   Follow Up Instructions:    I discussed the assessment and treatment plan with the patient. The patient was provided an opportunity to ask questions and all were answered. The patient agreed with the plan and demonstrated an understanding of the instructions.   The patient was advised to call back or seek an in-person evaluation if the symptoms worsen or if the condition fails to improve as anticipated.  I provided 30 minutes of non-face-to-face time during this encounter.   Kathlee Nations, MD

## 2018-09-22 DIAGNOSIS — M4316 Spondylolisthesis, lumbar region: Secondary | ICD-10-CM | POA: Diagnosis not present

## 2018-09-22 DIAGNOSIS — M1712 Unilateral primary osteoarthritis, left knee: Secondary | ICD-10-CM | POA: Diagnosis not present

## 2018-09-22 DIAGNOSIS — E119 Type 2 diabetes mellitus without complications: Secondary | ICD-10-CM | POA: Diagnosis not present

## 2018-09-22 DIAGNOSIS — M160 Bilateral primary osteoarthritis of hip: Secondary | ICD-10-CM | POA: Diagnosis not present

## 2018-09-22 DIAGNOSIS — I10 Essential (primary) hypertension: Secondary | ICD-10-CM | POA: Diagnosis not present

## 2018-09-22 DIAGNOSIS — M25462 Effusion, left knee: Secondary | ICD-10-CM | POA: Diagnosis not present

## 2018-09-28 ENCOUNTER — Encounter (HOSPITAL_COMMUNITY): Payer: Self-pay | Admitting: Emergency Medicine

## 2018-09-28 ENCOUNTER — Other Ambulatory Visit: Payer: Self-pay

## 2018-09-28 ENCOUNTER — Emergency Department (HOSPITAL_COMMUNITY)
Admission: EM | Admit: 2018-09-28 | Discharge: 2018-09-29 | Disposition: A | Discharge status: Home / Self Care | Payer: Medicare Other | Attending: Emergency Medicine | Admitting: Emergency Medicine

## 2018-09-28 DIAGNOSIS — R197 Diarrhea, unspecified: Secondary | ICD-10-CM | POA: Diagnosis not present

## 2018-09-28 DIAGNOSIS — I158 Other secondary hypertension: Secondary | ICD-10-CM

## 2018-09-28 DIAGNOSIS — Z87891 Personal history of nicotine dependence: Secondary | ICD-10-CM | POA: Insufficient documentation

## 2018-09-28 DIAGNOSIS — J449 Chronic obstructive pulmonary disease, unspecified: Secondary | ICD-10-CM | POA: Diagnosis not present

## 2018-09-28 DIAGNOSIS — Z79899 Other long term (current) drug therapy: Secondary | ICD-10-CM | POA: Insufficient documentation

## 2018-09-28 DIAGNOSIS — Z794 Long term (current) use of insulin: Secondary | ICD-10-CM | POA: Insufficient documentation

## 2018-09-28 DIAGNOSIS — R51 Headache: Secondary | ICD-10-CM | POA: Diagnosis not present

## 2018-09-28 DIAGNOSIS — M4316 Spondylolisthesis, lumbar region: Secondary | ICD-10-CM | POA: Diagnosis not present

## 2018-09-28 DIAGNOSIS — M1712 Unilateral primary osteoarthritis, left knee: Secondary | ICD-10-CM | POA: Diagnosis not present

## 2018-09-28 DIAGNOSIS — E119 Type 2 diabetes mellitus without complications: Secondary | ICD-10-CM | POA: Insufficient documentation

## 2018-09-28 DIAGNOSIS — I10 Essential (primary) hypertension: Secondary | ICD-10-CM | POA: Insufficient documentation

## 2018-09-28 DIAGNOSIS — M25462 Effusion, left knee: Secondary | ICD-10-CM | POA: Diagnosis not present

## 2018-09-28 DIAGNOSIS — M160 Bilateral primary osteoarthritis of hip: Secondary | ICD-10-CM | POA: Diagnosis not present

## 2018-09-28 LAB — COMPREHENSIVE METABOLIC PANEL
ALT: 12 U/L (ref 0–44)
AST: 20 U/L (ref 15–41)
Albumin: 3.8 g/dL (ref 3.5–5.0)
Alkaline Phosphatase: 77 U/L (ref 38–126)
Anion gap: 13 (ref 5–15)
BUN: 11 mg/dL (ref 8–23)
CO2: 22 mmol/L (ref 22–32)
Calcium: 9.2 mg/dL (ref 8.9–10.3)
Chloride: 106 mmol/L (ref 98–111)
Creatinine, Ser: 0.65 mg/dL (ref 0.44–1.00)
GFR calc Af Amer: >60 mL/min (ref >60)
GFR calc non Af Amer: >60 mL/min (ref >60)
Glucose, Bld: 97 mg/dL (ref 70–99)
Potassium: 3.7 mmol/L (ref 3.5–5.1)
Sodium: 141 mmol/L (ref 135–145)
Total Bilirubin: 0.4 mg/dL (ref 0.3–1.2)
Total Protein: 7.4 g/dL (ref 6.5–8.1)

## 2018-09-28 LAB — CBC
HCT: 39.1 % (ref 36.0–46.0)
Hemoglobin: 12.7 g/dL (ref 12.0–15.0)
MCH: 28.3 pg (ref 26.0–34.0)
MCHC: 32.5 g/dL (ref 30.0–36.0)
MCV: 87.3 fL (ref 80.0–100.0)
Platelets: 340 10*3/uL (ref 150–400)
RBC: 4.48 MIL/uL (ref 3.87–5.11)
RDW: 14.1 % (ref 11.5–15.5)
WBC: 9 10*3/uL (ref 4.0–10.5)
nRBC: 0 % (ref 0.0–0.2)

## 2018-09-28 LAB — LIPASE, BLOOD: Lipase: 19 U/L (ref 11–51)

## 2018-09-28 NOTE — ED Triage Notes (Addendum)
Pt states her therapist was at her house and told her to come to ED due to high BP.  Family member reports pt has had an intermittent headache x 3 days.  Pt reports headache started 1 hour ago but has resolved.  States she has had a "little" diarrhea today.  States now she just feels nervous.  No arm drift.  No neuro deficits noted on triage assessment.

## 2018-09-29 DIAGNOSIS — I10 Essential (primary) hypertension: Secondary | ICD-10-CM | POA: Diagnosis not present

## 2018-09-29 LAB — URINALYSIS, ROUTINE W REFLEX MICROSCOPIC
Bacteria, UA: NONE SEEN
Bilirubin Urine: NEGATIVE
Glucose, UA: NEGATIVE mg/dL
Hgb urine dipstick: NEGATIVE
Ketones, ur: NEGATIVE mg/dL
Nitrite: NEGATIVE
Protein, ur: 100 mg/dL — AB
Specific Gravity, Urine: 1.015 (ref 1.005–1.030)
pH: 6 (ref 5.0–8.0)

## 2018-09-29 MED ORDER — AMLODIPINE BESYLATE 5 MG PO TABS
10.0000 mg | ORAL_TABLET | Freq: Once | ORAL | Status: AC
  Administered 2018-09-29: 10 mg via ORAL
  Filled 2018-09-29: qty 2

## 2018-09-29 MED ORDER — LOSARTAN POTASSIUM 50 MG PO TABS
100.0000 mg | ORAL_TABLET | Freq: Once | ORAL | Status: AC
  Administered 2018-09-29: 04:00:00 100 mg via ORAL
  Filled 2018-09-29: qty 2
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
## 2018-09-29 NOTE — ED Notes (Addendum)
Pt discharged from ED; instructions provided; Pt encouraged to return to ED if symptoms worsen and to f/u with PCP; Pt verbalized understanding of all instructions 

## 2018-09-29 NOTE — Discharge Instructions (Addendum)
Please make sure that you are taking both Amlodipine and Losartan for your blood pressure.

## 2018-09-30 DIAGNOSIS — F324 Major depressive disorder, single episode, in partial remission: Secondary | ICD-10-CM | POA: Diagnosis not present

## 2018-09-30 DIAGNOSIS — I10 Essential (primary) hypertension: Secondary | ICD-10-CM | POA: Diagnosis not present

## 2018-09-30 DIAGNOSIS — F411 Generalized anxiety disorder: Secondary | ICD-10-CM | POA: Diagnosis not present

## 2018-10-04 DIAGNOSIS — I6782 Cerebral ischemia: Secondary | ICD-10-CM | POA: Diagnosis not present

## 2018-10-04 DIAGNOSIS — K922 Gastrointestinal hemorrhage, unspecified: Secondary | ICD-10-CM | POA: Diagnosis not present

## 2018-10-04 DIAGNOSIS — E785 Hyperlipidemia, unspecified: Secondary | ICD-10-CM | POA: Diagnosis not present

## 2018-10-04 DIAGNOSIS — J439 Emphysema, unspecified: Secondary | ICD-10-CM | POA: Diagnosis not present

## 2018-10-04 DIAGNOSIS — F411 Generalized anxiety disorder: Secondary | ICD-10-CM | POA: Diagnosis not present

## 2018-10-04 DIAGNOSIS — M25462 Effusion, left knee: Secondary | ICD-10-CM | POA: Diagnosis not present

## 2018-10-04 DIAGNOSIS — J45909 Unspecified asthma, uncomplicated: Secondary | ICD-10-CM | POA: Diagnosis not present

## 2018-10-04 DIAGNOSIS — M4316 Spondylolisthesis, lumbar region: Secondary | ICD-10-CM | POA: Diagnosis not present

## 2018-10-04 DIAGNOSIS — D630 Anemia in neoplastic disease: Secondary | ICD-10-CM | POA: Diagnosis not present

## 2018-10-04 DIAGNOSIS — M160 Bilateral primary osteoarthritis of hip: Secondary | ICD-10-CM | POA: Diagnosis not present

## 2018-10-04 DIAGNOSIS — D473 Essential (hemorrhagic) thrombocythemia: Secondary | ICD-10-CM | POA: Diagnosis not present

## 2018-10-04 DIAGNOSIS — E119 Type 2 diabetes mellitus without complications: Secondary | ICD-10-CM | POA: Diagnosis not present

## 2018-10-04 DIAGNOSIS — M0579 Rheumatoid arthritis with rheumatoid factor of multiple sites without organ or systems involvement: Secondary | ICD-10-CM | POA: Diagnosis not present

## 2018-10-04 DIAGNOSIS — M4312 Spondylolisthesis, cervical region: Secondary | ICD-10-CM | POA: Diagnosis not present

## 2018-10-04 DIAGNOSIS — I10 Essential (primary) hypertension: Secondary | ICD-10-CM | POA: Diagnosis not present

## 2018-10-04 DIAGNOSIS — F17200 Nicotine dependence, unspecified, uncomplicated: Secondary | ICD-10-CM | POA: Diagnosis not present

## 2018-10-04 DIAGNOSIS — E876 Hypokalemia: Secondary | ICD-10-CM | POA: Diagnosis not present

## 2018-10-04 DIAGNOSIS — F322 Major depressive disorder, single episode, severe without psychotic features: Secondary | ICD-10-CM | POA: Diagnosis not present

## 2018-10-04 DIAGNOSIS — K5790 Diverticulosis of intestine, part unspecified, without perforation or abscess without bleeding: Secondary | ICD-10-CM | POA: Diagnosis not present

## 2018-10-04 DIAGNOSIS — E669 Obesity, unspecified: Secondary | ICD-10-CM | POA: Diagnosis not present

## 2018-10-04 DIAGNOSIS — M069 Rheumatoid arthritis, unspecified: Secondary | ICD-10-CM | POA: Diagnosis not present

## 2018-10-04 DIAGNOSIS — M47816 Spondylosis without myelopathy or radiculopathy, lumbar region: Secondary | ICD-10-CM | POA: Diagnosis not present

## 2018-10-04 DIAGNOSIS — M5030 Other cervical disc degeneration, unspecified cervical region: Secondary | ICD-10-CM | POA: Diagnosis not present

## 2018-10-04 DIAGNOSIS — M1712 Unilateral primary osteoarthritis, left knee: Secondary | ICD-10-CM | POA: Diagnosis not present

## 2018-10-04 DIAGNOSIS — K921 Melena: Secondary | ICD-10-CM | POA: Diagnosis not present

## 2018-10-04 DIAGNOSIS — G319 Degenerative disease of nervous system, unspecified: Secondary | ICD-10-CM | POA: Diagnosis not present

## 2018-10-05 DIAGNOSIS — R0981 Nasal congestion: Secondary | ICD-10-CM | POA: Diagnosis not present

## 2018-10-05 DIAGNOSIS — R0781 Pleurodynia: Secondary | ICD-10-CM | POA: Diagnosis not present

## 2018-10-06 ENCOUNTER — Ambulatory Visit
Admission: RE | Admit: 2018-10-06 | Discharge: 2018-10-06 | Disposition: A | Discharge status: Home / Self Care | Payer: Medicare Other | Source: Ambulatory Visit | Attending: Internal Medicine | Admitting: Internal Medicine

## 2018-10-06 ENCOUNTER — Other Ambulatory Visit: Payer: Self-pay | Admitting: Internal Medicine

## 2018-10-06 DIAGNOSIS — R0781 Pleurodynia: Secondary | ICD-10-CM

## 2018-10-06 DIAGNOSIS — R0789 Other chest pain: Secondary | ICD-10-CM

## 2018-10-07 DIAGNOSIS — I10 Essential (primary) hypertension: Secondary | ICD-10-CM | POA: Diagnosis not present

## 2018-10-07 DIAGNOSIS — M25462 Effusion, left knee: Secondary | ICD-10-CM | POA: Diagnosis not present

## 2018-10-07 DIAGNOSIS — M160 Bilateral primary osteoarthritis of hip: Secondary | ICD-10-CM | POA: Diagnosis not present

## 2018-10-07 DIAGNOSIS — E119 Type 2 diabetes mellitus without complications: Secondary | ICD-10-CM | POA: Diagnosis not present

## 2018-10-07 DIAGNOSIS — M4316 Spondylolisthesis, lumbar region: Secondary | ICD-10-CM | POA: Diagnosis not present

## 2018-10-07 DIAGNOSIS — M1712 Unilateral primary osteoarthritis, left knee: Secondary | ICD-10-CM | POA: Diagnosis not present

## 2018-10-12 DIAGNOSIS — M25462 Effusion, left knee: Secondary | ICD-10-CM | POA: Diagnosis not present

## 2018-10-12 DIAGNOSIS — I10 Essential (primary) hypertension: Secondary | ICD-10-CM | POA: Diagnosis not present

## 2018-10-12 DIAGNOSIS — E119 Type 2 diabetes mellitus without complications: Secondary | ICD-10-CM | POA: Diagnosis not present

## 2018-10-12 DIAGNOSIS — M1712 Unilateral primary osteoarthritis, left knee: Secondary | ICD-10-CM | POA: Diagnosis not present

## 2018-10-12 DIAGNOSIS — M4316 Spondylolisthesis, lumbar region: Secondary | ICD-10-CM | POA: Diagnosis not present

## 2018-10-12 DIAGNOSIS — M160 Bilateral primary osteoarthritis of hip: Secondary | ICD-10-CM | POA: Diagnosis not present

## 2018-10-13 DIAGNOSIS — M25462 Effusion, left knee: Secondary | ICD-10-CM | POA: Diagnosis not present

## 2018-10-13 DIAGNOSIS — I10 Essential (primary) hypertension: Secondary | ICD-10-CM | POA: Diagnosis not present

## 2018-10-13 DIAGNOSIS — M1712 Unilateral primary osteoarthritis, left knee: Secondary | ICD-10-CM | POA: Diagnosis not present

## 2018-10-13 DIAGNOSIS — M160 Bilateral primary osteoarthritis of hip: Secondary | ICD-10-CM | POA: Diagnosis not present

## 2018-10-13 DIAGNOSIS — M4316 Spondylolisthesis, lumbar region: Secondary | ICD-10-CM | POA: Diagnosis not present

## 2018-10-13 DIAGNOSIS — E119 Type 2 diabetes mellitus without complications: Secondary | ICD-10-CM | POA: Diagnosis not present

## 2018-10-17 DIAGNOSIS — I10 Essential (primary) hypertension: Secondary | ICD-10-CM | POA: Diagnosis not present

## 2018-10-17 DIAGNOSIS — Z23 Encounter for immunization: Secondary | ICD-10-CM | POA: Diagnosis not present

## 2018-10-17 DIAGNOSIS — M179 Osteoarthritis of knee, unspecified: Secondary | ICD-10-CM | POA: Diagnosis not present

## 2018-10-17 DIAGNOSIS — J45909 Unspecified asthma, uncomplicated: Secondary | ICD-10-CM | POA: Diagnosis not present

## 2018-10-17 DIAGNOSIS — E1140 Type 2 diabetes mellitus with diabetic neuropathy, unspecified: Secondary | ICD-10-CM | POA: Diagnosis not present

## 2018-10-17 DIAGNOSIS — E119 Type 2 diabetes mellitus without complications: Secondary | ICD-10-CM | POA: Diagnosis not present

## 2018-10-17 DIAGNOSIS — E1165 Type 2 diabetes mellitus with hyperglycemia: Secondary | ICD-10-CM | POA: Diagnosis not present

## 2018-10-17 DIAGNOSIS — F324 Major depressive disorder, single episode, in partial remission: Secondary | ICD-10-CM | POA: Diagnosis not present

## 2018-10-17 DIAGNOSIS — M069 Rheumatoid arthritis, unspecified: Secondary | ICD-10-CM | POA: Diagnosis not present

## 2018-10-17 DIAGNOSIS — J449 Chronic obstructive pulmonary disease, unspecified: Secondary | ICD-10-CM | POA: Diagnosis not present

## 2018-10-17 DIAGNOSIS — M8580 Other specified disorders of bone density and structure, unspecified site: Secondary | ICD-10-CM | POA: Diagnosis not present

## 2018-10-17 DIAGNOSIS — M1990 Unspecified osteoarthritis, unspecified site: Secondary | ICD-10-CM | POA: Diagnosis not present

## 2018-10-19 DIAGNOSIS — I10 Essential (primary) hypertension: Secondary | ICD-10-CM | POA: Diagnosis not present

## 2018-10-19 DIAGNOSIS — E119 Type 2 diabetes mellitus without complications: Secondary | ICD-10-CM | POA: Diagnosis not present

## 2018-10-19 DIAGNOSIS — M25462 Effusion, left knee: Secondary | ICD-10-CM | POA: Diagnosis not present

## 2018-10-19 DIAGNOSIS — M4316 Spondylolisthesis, lumbar region: Secondary | ICD-10-CM | POA: Diagnosis not present

## 2018-10-19 DIAGNOSIS — M160 Bilateral primary osteoarthritis of hip: Secondary | ICD-10-CM | POA: Diagnosis not present

## 2018-10-19 DIAGNOSIS — M1712 Unilateral primary osteoarthritis, left knee: Secondary | ICD-10-CM | POA: Diagnosis not present

## 2018-10-26 DIAGNOSIS — I10 Essential (primary) hypertension: Secondary | ICD-10-CM | POA: Diagnosis not present

## 2018-10-26 DIAGNOSIS — M4316 Spondylolisthesis, lumbar region: Secondary | ICD-10-CM | POA: Diagnosis not present

## 2018-10-26 DIAGNOSIS — E119 Type 2 diabetes mellitus without complications: Secondary | ICD-10-CM | POA: Diagnosis not present

## 2018-10-26 DIAGNOSIS — M160 Bilateral primary osteoarthritis of hip: Secondary | ICD-10-CM | POA: Diagnosis not present

## 2018-10-26 DIAGNOSIS — M25462 Effusion, left knee: Secondary | ICD-10-CM | POA: Diagnosis not present

## 2018-10-26 DIAGNOSIS — M1712 Unilateral primary osteoarthritis, left knee: Secondary | ICD-10-CM | POA: Diagnosis not present

## 2018-11-01 DIAGNOSIS — I10 Essential (primary) hypertension: Secondary | ICD-10-CM | POA: Diagnosis not present

## 2018-11-01 DIAGNOSIS — F411 Generalized anxiety disorder: Secondary | ICD-10-CM | POA: Diagnosis not present

## 2018-11-02 DIAGNOSIS — M4316 Spondylolisthesis, lumbar region: Secondary | ICD-10-CM | POA: Diagnosis not present

## 2018-11-02 DIAGNOSIS — I10 Essential (primary) hypertension: Secondary | ICD-10-CM | POA: Diagnosis not present

## 2018-11-02 DIAGNOSIS — E119 Type 2 diabetes mellitus without complications: Secondary | ICD-10-CM | POA: Diagnosis not present

## 2018-11-02 DIAGNOSIS — M0579 Rheumatoid arthritis with rheumatoid factor of multiple sites without organ or systems involvement: Secondary | ICD-10-CM | POA: Diagnosis not present

## 2018-11-02 DIAGNOSIS — M1712 Unilateral primary osteoarthritis, left knee: Secondary | ICD-10-CM | POA: Diagnosis not present

## 2018-11-02 DIAGNOSIS — Z79899 Other long term (current) drug therapy: Secondary | ICD-10-CM | POA: Diagnosis not present

## 2018-11-02 DIAGNOSIS — M2550 Pain in unspecified joint: Secondary | ICD-10-CM | POA: Diagnosis not present

## 2018-11-02 DIAGNOSIS — M25462 Effusion, left knee: Secondary | ICD-10-CM | POA: Diagnosis not present

## 2018-11-02 DIAGNOSIS — M150 Primary generalized (osteo)arthritis: Secondary | ICD-10-CM | POA: Diagnosis not present

## 2018-11-02 DIAGNOSIS — M160 Bilateral primary osteoarthritis of hip: Secondary | ICD-10-CM | POA: Diagnosis not present

## 2018-11-03 DIAGNOSIS — M069 Rheumatoid arthritis, unspecified: Secondary | ICD-10-CM | POA: Diagnosis not present

## 2018-11-03 DIAGNOSIS — M47816 Spondylosis without myelopathy or radiculopathy, lumbar region: Secondary | ICD-10-CM | POA: Diagnosis not present

## 2018-11-03 DIAGNOSIS — M4316 Spondylolisthesis, lumbar region: Secondary | ICD-10-CM | POA: Diagnosis not present

## 2018-11-03 DIAGNOSIS — G8929 Other chronic pain: Secondary | ICD-10-CM | POA: Diagnosis not present

## 2018-11-03 DIAGNOSIS — M25462 Effusion, left knee: Secondary | ICD-10-CM | POA: Diagnosis not present

## 2018-11-03 DIAGNOSIS — M5030 Other cervical disc degeneration, unspecified cervical region: Secondary | ICD-10-CM | POA: Diagnosis not present

## 2018-11-03 DIAGNOSIS — M4716 Other spondylosis with myelopathy, lumbar region: Secondary | ICD-10-CM | POA: Diagnosis not present

## 2018-11-03 DIAGNOSIS — E876 Hypokalemia: Secondary | ICD-10-CM | POA: Diagnosis not present

## 2018-11-03 DIAGNOSIS — M160 Bilateral primary osteoarthritis of hip: Secondary | ICD-10-CM | POA: Diagnosis not present

## 2018-11-03 DIAGNOSIS — S82112D Displaced fracture of left tibial spine, subsequent encounter for closed fracture with routine healing: Secondary | ICD-10-CM | POA: Diagnosis not present

## 2018-11-03 DIAGNOSIS — M1712 Unilateral primary osteoarthritis, left knee: Secondary | ICD-10-CM | POA: Diagnosis not present

## 2018-11-06 ENCOUNTER — Emergency Department (HOSPITAL_COMMUNITY): Payer: Medicare Other

## 2018-11-06 ENCOUNTER — Other Ambulatory Visit: Payer: Self-pay

## 2018-11-06 ENCOUNTER — Inpatient Hospital Stay (HOSPITAL_COMMUNITY)
Admission: EM | Admit: 2018-11-06 | Discharge: 2018-11-11 | DRG: 193 | Disposition: A | Discharge status: Home / Self Care | Payer: Medicare Other | Attending: Family Medicine | Admitting: Family Medicine

## 2018-11-06 ENCOUNTER — Encounter (HOSPITAL_COMMUNITY): Payer: Self-pay

## 2018-11-06 DIAGNOSIS — D849 Immunodeficiency, unspecified: Secondary | ICD-10-CM | POA: Diagnosis present

## 2018-11-06 DIAGNOSIS — Z7951 Long term (current) use of inhaled steroids: Secondary | ICD-10-CM

## 2018-11-06 DIAGNOSIS — Z79899 Other long term (current) drug therapy: Secondary | ICD-10-CM

## 2018-11-06 DIAGNOSIS — E876 Hypokalemia: Secondary | ICD-10-CM | POA: Diagnosis present

## 2018-11-06 DIAGNOSIS — R350 Frequency of micturition: Secondary | ICD-10-CM | POA: Diagnosis present

## 2018-11-06 DIAGNOSIS — F329 Major depressive disorder, single episode, unspecified: Secondary | ICD-10-CM | POA: Diagnosis present

## 2018-11-06 DIAGNOSIS — Z882 Allergy status to sulfonamides status: Secondary | ICD-10-CM

## 2018-11-06 DIAGNOSIS — F32A Depression, unspecified: Secondary | ICD-10-CM | POA: Diagnosis present

## 2018-11-06 DIAGNOSIS — J90 Pleural effusion, not elsewhere classified: Secondary | ICD-10-CM

## 2018-11-06 DIAGNOSIS — I10 Essential (primary) hypertension: Secondary | ICD-10-CM | POA: Diagnosis present

## 2018-11-06 DIAGNOSIS — Z79891 Long term (current) use of opiate analgesic: Secondary | ICD-10-CM

## 2018-11-06 DIAGNOSIS — Z794 Long term (current) use of insulin: Secondary | ICD-10-CM | POA: Diagnosis not present

## 2018-11-06 DIAGNOSIS — J45901 Unspecified asthma with (acute) exacerbation: Secondary | ICD-10-CM | POA: Diagnosis present

## 2018-11-06 DIAGNOSIS — J9811 Atelectasis: Secondary | ICD-10-CM | POA: Diagnosis not present

## 2018-11-06 DIAGNOSIS — Z20828 Contact with and (suspected) exposure to other viral communicable diseases: Secondary | ICD-10-CM | POA: Diagnosis present

## 2018-11-06 DIAGNOSIS — R0602 Shortness of breath: Secondary | ICD-10-CM | POA: Diagnosis not present

## 2018-11-06 DIAGNOSIS — J441 Chronic obstructive pulmonary disease with (acute) exacerbation: Secondary | ICD-10-CM | POA: Diagnosis present

## 2018-11-06 DIAGNOSIS — J9621 Acute and chronic respiratory failure with hypoxia: Secondary | ICD-10-CM | POA: Diagnosis present

## 2018-11-06 DIAGNOSIS — R000 Tachycardia, unspecified: Secondary | ICD-10-CM | POA: Diagnosis not present

## 2018-11-06 DIAGNOSIS — K219 Gastro-esophageal reflux disease without esophagitis: Secondary | ICD-10-CM | POA: Diagnosis present

## 2018-11-06 DIAGNOSIS — Z87891 Personal history of nicotine dependence: Secondary | ICD-10-CM

## 2018-11-06 DIAGNOSIS — Y95 Nosocomial condition: Secondary | ICD-10-CM | POA: Diagnosis present

## 2018-11-06 DIAGNOSIS — J9601 Acute respiratory failure with hypoxia: Secondary | ICD-10-CM | POA: Diagnosis present

## 2018-11-06 DIAGNOSIS — E785 Hyperlipidemia, unspecified: Secondary | ICD-10-CM | POA: Diagnosis present

## 2018-11-06 DIAGNOSIS — E119 Type 2 diabetes mellitus without complications: Secondary | ICD-10-CM | POA: Diagnosis present

## 2018-11-06 DIAGNOSIS — F419 Anxiety disorder, unspecified: Secondary | ICD-10-CM | POA: Diagnosis present

## 2018-11-06 DIAGNOSIS — Z7982 Long term (current) use of aspirin: Secondary | ICD-10-CM

## 2018-11-06 DIAGNOSIS — Z833 Family history of diabetes mellitus: Secondary | ICD-10-CM

## 2018-11-06 DIAGNOSIS — J440 Chronic obstructive pulmonary disease with acute lower respiratory infection: Secondary | ICD-10-CM | POA: Diagnosis present

## 2018-11-06 DIAGNOSIS — R069 Unspecified abnormalities of breathing: Secondary | ICD-10-CM | POA: Diagnosis not present

## 2018-11-06 DIAGNOSIS — M069 Rheumatoid arthritis, unspecified: Secondary | ICD-10-CM | POA: Diagnosis present

## 2018-11-06 DIAGNOSIS — M1990 Unspecified osteoarthritis, unspecified site: Secondary | ICD-10-CM | POA: Diagnosis present

## 2018-11-06 DIAGNOSIS — G4700 Insomnia, unspecified: Secondary | ICD-10-CM | POA: Diagnosis present

## 2018-11-06 DIAGNOSIS — J918 Pleural effusion in other conditions classified elsewhere: Secondary | ICD-10-CM | POA: Diagnosis present

## 2018-11-06 DIAGNOSIS — J189 Pneumonia, unspecified organism: Secondary | ICD-10-CM | POA: Diagnosis present

## 2018-11-06 DIAGNOSIS — Z885 Allergy status to narcotic agent status: Secondary | ICD-10-CM

## 2018-11-06 DIAGNOSIS — Z91013 Allergy to seafood: Secondary | ICD-10-CM

## 2018-11-06 DIAGNOSIS — Z8249 Family history of ischemic heart disease and other diseases of the circulatory system: Secondary | ICD-10-CM

## 2018-11-06 DIAGNOSIS — Z889 Allergy status to unspecified drugs, medicaments and biological substances status: Secondary | ICD-10-CM

## 2018-11-06 DIAGNOSIS — Z880 Allergy status to penicillin: Secondary | ICD-10-CM

## 2018-11-06 DIAGNOSIS — J439 Emphysema, unspecified: Secondary | ICD-10-CM | POA: Diagnosis not present

## 2018-11-06 DIAGNOSIS — R110 Nausea: Secondary | ICD-10-CM | POA: Diagnosis not present

## 2018-11-06 DIAGNOSIS — Z881 Allergy status to other antibiotic agents status: Secondary | ICD-10-CM

## 2018-11-06 LAB — BASIC METABOLIC PANEL
Anion gap: 17 — ABNORMAL HIGH (ref 5–15)
BUN: 14 mg/dL (ref 8–23)
CO2: 20 mmol/L — ABNORMAL LOW (ref 22–32)
Calcium: 9 mg/dL (ref 8.9–10.3)
Chloride: 101 mmol/L (ref 98–111)
Creatinine, Ser: 0.83 mg/dL (ref 0.44–1.00)
GFR calc Af Amer: >60 mL/min (ref >60)
GFR calc non Af Amer: >60 mL/min (ref >60)
Glucose, Bld: 197 mg/dL — ABNORMAL HIGH (ref 70–99)
Potassium: 3.1 mmol/L — ABNORMAL LOW (ref 3.5–5.1)
Sodium: 138 mmol/L (ref 135–145)

## 2018-11-06 LAB — CBC WITH DIFFERENTIAL/PLATELET
Abs Immature Granulocytes: 0.03 10*3/uL (ref 0.00–0.07)
Basophils Absolute: 0.1 10*3/uL (ref 0.0–0.1)
Basophils Relative: 1 %
Eosinophils Absolute: 0 10*3/uL (ref 0.0–0.5)
Eosinophils Relative: 0 %
HCT: 41.3 % (ref 36.0–46.0)
Hemoglobin: 13.3 g/dL (ref 12.0–15.0)
Immature Granulocytes: 0 %
Lymphocytes Relative: 17 %
Lymphs Abs: 2 10*3/uL (ref 0.7–4.0)
MCH: 27.7 pg (ref 26.0–34.0)
MCHC: 32.2 g/dL (ref 30.0–36.0)
MCV: 86 fL (ref 80.0–100.0)
Monocytes Absolute: 0.7 10*3/uL (ref 0.1–1.0)
Monocytes Relative: 6 %
Neutro Abs: 9.2 10*3/uL — ABNORMAL HIGH (ref 1.7–7.7)
Neutrophils Relative %: 76 %
Platelets: 370 10*3/uL (ref 150–400)
RBC: 4.8 MIL/uL (ref 3.87–5.11)
RDW: 14.6 % (ref 11.5–15.5)
WBC: 12 10*3/uL — ABNORMAL HIGH (ref 4.0–10.5)
nRBC: 0 % (ref 0.0–0.2)

## 2018-11-06 LAB — TROPONIN I (HIGH SENSITIVITY)
Troponin I (High Sensitivity): 13 ng/L (ref <18)
Troponin I (High Sensitivity): 75 ng/L — ABNORMAL HIGH (ref <18)

## 2018-11-06 LAB — CBG MONITORING, ED: Glucose-Capillary: 138 mg/dL — ABNORMAL HIGH (ref 70–99)

## 2018-11-06 MED ORDER — FERROUS SULFATE 325 (65 FE) MG PO TABS
325.0000 mg | ORAL_TABLET | Freq: Every morning | ORAL | Status: DC
  Administered 2018-11-07 – 2018-11-09 (×3): 325 mg via ORAL
  Filled 2018-11-06 (×3): qty 1

## 2018-11-06 MED ORDER — FLUTICASONE FUROATE-VILANTEROL 100-25 MCG/INH IN AEPB
1.0000 | INHALATION_SPRAY | Freq: Every morning | RESPIRATORY_TRACT | Status: DC
  Administered 2018-11-08 – 2018-11-11 (×4): 1 via RESPIRATORY_TRACT
  Filled 2018-11-06: qty 28

## 2018-11-06 MED ORDER — PRAVASTATIN SODIUM 10 MG PO TABS
10.0000 mg | ORAL_TABLET | Freq: Every morning | ORAL | Status: DC
  Administered 2018-11-07 – 2018-11-11 (×5): 10 mg via ORAL
  Filled 2018-11-06 (×5): qty 1

## 2018-11-06 MED ORDER — INSULIN ASPART 100 UNIT/ML ~~LOC~~ SOLN
0.0000 [IU] | Freq: Three times a day (TID) | SUBCUTANEOUS | Status: DC
  Administered 2018-11-07: 7 [IU] via SUBCUTANEOUS
  Administered 2018-11-07 (×2): 5 [IU] via SUBCUTANEOUS
  Administered 2018-11-08: 3 [IU] via SUBCUTANEOUS

## 2018-11-06 MED ORDER — ASPIRIN EC 81 MG PO TBEC
81.0000 mg | DELAYED_RELEASE_TABLET | Freq: Every morning | ORAL | Status: DC
  Administered 2018-11-07 – 2018-11-11 (×5): 81 mg via ORAL
  Filled 2018-11-06 (×5): qty 1

## 2018-11-06 MED ORDER — AMLODIPINE BESYLATE 10 MG PO TABS
10.0000 mg | ORAL_TABLET | Freq: Two times a day (BID) | ORAL | Status: DC
  Administered 2018-11-07 – 2018-11-11 (×9): 10 mg via ORAL
  Filled 2018-11-06 (×8): qty 1
  Filled 2018-11-06: qty 2

## 2018-11-06 MED ORDER — VANCOMYCIN HCL 10 G IV SOLR
1500.0000 mg | Freq: Once | INTRAVENOUS | Status: AC
  Administered 2018-11-06: 1500 mg via INTRAVENOUS
  Filled 2018-11-06: qty 1500

## 2018-11-06 MED ORDER — HEPARIN SODIUM (PORCINE) 5000 UNIT/ML IJ SOLN
5000.0000 [IU] | Freq: Three times a day (TID) | INTRAMUSCULAR | Status: DC
  Administered 2018-11-06 – 2018-11-09 (×7): 5000 [IU] via SUBCUTANEOUS
  Filled 2018-11-06 (×7): qty 1

## 2018-11-06 MED ORDER — INSULIN ASPART 100 UNIT/ML ~~LOC~~ SOLN
0.0000 [IU] | Freq: Every day | SUBCUTANEOUS | Status: DC
  Administered 2018-11-07: 4 [IU] via SUBCUTANEOUS

## 2018-11-06 MED ORDER — VANCOMYCIN HCL IN DEXTROSE 1-5 GM/200ML-% IV SOLN
1000.0000 mg | Freq: Once | INTRAVENOUS | Status: DC
  Filled 2018-11-06: qty 200

## 2018-11-06 MED ORDER — SODIUM CHLORIDE 0.9 % IV SOLN
2.0000 g | Freq: Once | INTRAVENOUS | Status: AC
  Administered 2018-11-06: 2 g via INTRAVENOUS
  Filled 2018-11-06: qty 2

## 2018-11-06 MED ORDER — POTASSIUM CHLORIDE CRYS ER 20 MEQ PO TBCR
40.0000 meq | EXTENDED_RELEASE_TABLET | Freq: Once | ORAL | Status: AC
  Administered 2018-11-06: 40 meq via ORAL
  Filled 2018-11-06: qty 2

## 2018-11-06 MED ORDER — LEFLUNOMIDE 20 MG PO TABS
20.0000 mg | ORAL_TABLET | Freq: Every morning | ORAL | Status: DC
  Administered 2018-11-08 – 2018-11-11 (×4): 20 mg via ORAL
  Filled 2018-11-06 (×4): qty 1

## 2018-11-06 MED ORDER — VANCOMYCIN HCL 10 G IV SOLR
1250.0000 mg | INTRAVENOUS | Status: DC
  Administered 2018-11-07: 1250 mg via INTRAVENOUS
  Filled 2018-11-06 (×2): qty 1250

## 2018-11-06 MED ORDER — IPRATROPIUM BROMIDE 0.02 % IN SOLN
3.0000 mL | RESPIRATORY_TRACT | Status: DC
  Administered 2018-11-06 – 2018-11-07 (×3): 0.6 mg via RESPIRATORY_TRACT
  Filled 2018-11-06 (×3): qty 5

## 2018-11-06 MED ORDER — FLUTICASONE PROPIONATE 50 MCG/ACT NA SUSP: 2.0000 | Freq: Every day | NASAL | Status: DC | PRN

## 2018-11-06 MED ORDER — DULOXETINE HCL 60 MG PO CPEP
60.0000 mg | ORAL_CAPSULE | Freq: Two times a day (BID) | ORAL | Status: DC
  Administered 2018-11-06 – 2018-11-11 (×9): 60 mg via ORAL
  Filled 2018-11-06 (×12): qty 1

## 2018-11-06 MED ORDER — METHYLPREDNISOLONE SODIUM SUCC 125 MG IJ SOLR
60.0000 mg | Freq: Two times a day (BID) | INTRAMUSCULAR | Status: DC
  Administered 2018-11-06 – 2018-11-08 (×4): 60 mg via INTRAVENOUS
  Filled 2018-11-06 (×4): qty 2

## 2018-11-06 MED ORDER — ALBUTEROL SULFATE (2.5 MG/3ML) 0.083% IN NEBU: 3.0000 mL | INHALATION_SOLUTION | RESPIRATORY_TRACT | Status: DC | PRN

## 2018-11-06 MED ORDER — LOSARTAN POTASSIUM 50 MG PO TABS
100.0000 mg | ORAL_TABLET | Freq: Every morning | ORAL | Status: DC
  Administered 2018-11-07 – 2018-11-11 (×5): 100 mg via ORAL
  Filled 2018-11-06 (×5): qty 2

## 2018-11-06 MED ORDER — DM-GUAIFENESIN ER 30-600 MG PO TB12: 1.0000 | ORAL_TABLET | Freq: Two times a day (BID) | ORAL | Status: DC | PRN

## 2018-11-06 MED ORDER — SODIUM CHLORIDE 0.9 % IV SOLN
2.0000 g | Freq: Two times a day (BID) | INTRAVENOUS | Status: DC
  Administered 2018-11-07 – 2018-11-10 (×8): 2 g via INTRAVENOUS
  Filled 2018-11-06 (×10): qty 2

## 2018-11-06 MED ORDER — POLYETHYLENE GLYCOL 3350 17 G PO PACK
17.0000 g | PACK | Freq: Every day | ORAL | Status: DC | PRN
  Administered 2018-11-08: 17 g via ORAL
  Filled 2018-11-06: qty 1

## 2018-11-06 MED ORDER — ALPRAZOLAM 0.25 MG PO TABS
0.2500 mg | ORAL_TABLET | Freq: Two times a day (BID) | ORAL | Status: DC | PRN
  Administered 2018-11-07 (×2): 0.25 mg via ORAL
  Filled 2018-11-06 (×2): qty 1

## 2018-11-06 MED ORDER — PANTOPRAZOLE SODIUM 40 MG PO TBEC
40.0000 mg | DELAYED_RELEASE_TABLET | Freq: Every morning | ORAL | Status: DC
  Administered 2018-11-07 – 2018-11-11 (×5): 40 mg via ORAL
  Filled 2018-11-06 (×5): qty 1

## 2018-11-06 MED ORDER — ADULT MULTIVITAMIN W/MINERALS CH
1.0000 | ORAL_TABLET | Freq: Every morning | ORAL | Status: DC
  Administered 2018-11-07 – 2018-11-11 (×5): 1 via ORAL
  Filled 2018-11-06 (×5): qty 1

## 2018-11-06 MED ORDER — OXYCODONE HCL 5 MG PO TABS
10.0000 mg | ORAL_TABLET | Freq: Four times a day (QID) | ORAL | Status: DC | PRN
  Administered 2018-11-10: 10 mg via ORAL
  Filled 2018-11-06 (×2): qty 2

## 2018-11-06 MED ORDER — POLYVINYL ALCOHOL 1.4 % OP SOLN: 1.0000 [drp] | Freq: Every day | OPHTHALMIC | Status: DC | PRN
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
## 2018-11-06 NOTE — Progress Notes (Signed)
Pharmacy Antibiotic Note  Leslie Gamble is a 78 y.o. female admitted on 11/06/2018 with pneumonia.  Pharmacy has been consulted for vancomycin and cefepime dosing. Pt is afebrile but WBC is elevated at 12. SCr is WNL. Noted allergy to cephalexin but pt has tolerated cefixime in the past.   Plan: Vancomycin 1500mg  IV x 1 then 1250mg  IV Q24h Cefepime 2gm IV Q12H F/u renal fxn, C&S, clinical status and peak/trough at SS  Height: 5\' 7"  (170.2 cm) Weight: 165 lb 2 oz (74.9 kg) IBW/kg (Calculated) : 61.6  Temp (24hrs), Avg:98.8 F (37.1 C), Min:98.8 F (37.1 C), Max:98.8 F (37.1 C)  Recent Labs  Lab 11/06/18 1653  WBC 12.0*  CREATININE 0.83    Estimated Creatinine Clearance: 60.9 mL/min (by C-G formula based on SCr of 0.83 mg/dL).    Allergies  Allergen Reactions  . Avelox [Moxifloxacin Hcl In Nacl] Itching and Swelling  . Cephalexin     Nose swelling  . Ciprofloxacin Swelling  . Clarithromycin Swelling  . Clindamycin Hcl Swelling  . Codeine     Lip swelling  . Diflucan [Fluconazole] Diarrhea  . Lidocaine Swelling  . Metronidazole Swelling  . Nitrofurantoin Monohyd Macro Swelling  . Other     Magic mouthwash   . Penicillins Swelling  . Shrimp [Shellfish Allergy] Swelling    In nose and lips  . Sitagliptin Swelling  . Sulfa Antibiotics Swelling  . Tetracyclines & Related Swelling  . Tramadol Hcl     REACTION: facial swelling, dyspnea    Antimicrobials this admission: Vanc 10/18>> Cefepime 10/18>>  Dose adjustments this admission: N/A  Microbiology results: Pending  Thank you for allowing pharmacy to be a part of this patient's care.  Koreen Lizaola, Rande Lawman 11/06/2018 7:33 PM

## 2018-11-06 NOTE — ED Notes (Signed)
Pharmacy tech at bedside 
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
## 2018-11-07 LAB — CBC WITH DIFFERENTIAL/PLATELET
Abs Immature Granulocytes: 0.01 10*3/uL (ref 0.00–0.07)
Basophils Absolute: 0 10*3/uL (ref 0.0–0.1)
Basophils Relative: 0 %
Eosinophils Absolute: 0 10*3/uL (ref 0.0–0.5)
Eosinophils Relative: 0 %
HCT: 39.2 % (ref 36.0–46.0)
Hemoglobin: 13.2 g/dL (ref 12.0–15.0)
Immature Granulocytes: 0 %
Lymphocytes Relative: 12 %
Lymphs Abs: 0.6 10*3/uL — ABNORMAL LOW (ref 0.7–4.0)
MCH: 28.6 pg (ref 26.0–34.0)
MCHC: 33.7 g/dL (ref 30.0–36.0)
MCV: 85 fL (ref 80.0–100.0)
Monocytes Absolute: 0 10*3/uL — ABNORMAL LOW (ref 0.1–1.0)
Monocytes Relative: 1 %
Neutro Abs: 4.7 10*3/uL (ref 1.7–7.7)
Neutrophils Relative %: 87 %
Platelets: 364 10*3/uL (ref 150–400)
RBC: 4.61 MIL/uL (ref 3.87–5.11)
RDW: 14.3 % (ref 11.5–15.5)
WBC: 5.4 10*3/uL (ref 4.0–10.5)
nRBC: 0 % (ref 0.0–0.2)

## 2018-11-07 LAB — URINALYSIS, ROUTINE W REFLEX MICROSCOPIC
Bilirubin Urine: NEGATIVE
Glucose, UA: 50 mg/dL — AB
Hgb urine dipstick: NEGATIVE
Ketones, ur: NEGATIVE mg/dL
Leukocytes,Ua: NEGATIVE
Nitrite: NEGATIVE
Protein, ur: >=300 mg/dL — AB
Specific Gravity, Urine: 1.019 (ref 1.005–1.030)
pH: 6 (ref 5.0–8.0)

## 2018-11-07 LAB — RENAL FUNCTION PANEL
Albumin: 3.2 g/dL — ABNORMAL LOW (ref 3.5–5.0)
Anion gap: 13 (ref 5–15)
BUN: 16 mg/dL (ref 8–23)
CO2: 21 mmol/L — ABNORMAL LOW (ref 22–32)
Calcium: 8.7 mg/dL — ABNORMAL LOW (ref 8.9–10.3)
Chloride: 103 mmol/L (ref 98–111)
Creatinine, Ser: 0.67 mg/dL (ref 0.44–1.00)
GFR calc Af Amer: >60 mL/min (ref >60)
GFR calc non Af Amer: >60 mL/min (ref >60)
Glucose, Bld: 308 mg/dL — ABNORMAL HIGH (ref 70–99)
Phosphorus: 2.1 mg/dL — ABNORMAL LOW (ref 2.5–4.6)
Potassium: 3.8 mmol/L (ref 3.5–5.1)
Sodium: 137 mmol/L (ref 135–145)

## 2018-11-07 LAB — CBG MONITORING, ED
Glucose-Capillary: 277 mg/dL — ABNORMAL HIGH (ref 70–99)
Glucose-Capillary: 264 mg/dL — ABNORMAL HIGH (ref 70–99)

## 2018-11-07 LAB — STREP PNEUMONIAE URINARY ANTIGEN: Strep Pneumo Urinary Antigen: NEGATIVE

## 2018-11-07 LAB — GLUCOSE, CAPILLARY
Glucose-Capillary: 326 mg/dL — ABNORMAL HIGH (ref 70–99)
Glucose-Capillary: 331 mg/dL — ABNORMAL HIGH (ref 70–99)

## 2018-11-07 LAB — HIV ANTIBODY (ROUTINE TESTING W REFLEX): HIV Screen 4th Generation wRfx: NONREACTIVE

## 2018-11-07 LAB — SARS CORONAVIRUS 2 BY RT PCR (HOSPITAL ORDER, PERFORMED IN ~~LOC~~ HOSPITAL LAB): SARS Coronavirus 2: NEGATIVE

## 2018-11-07 LAB — MAGNESIUM: Magnesium: 1.2 mg/dL — ABNORMAL LOW (ref 1.7–2.4)

## 2018-11-07 MED ORDER — NON FORMULARY: 5.0000 mg | Freq: Every day | Status: DC

## 2018-11-07 MED ORDER — IPRATROPIUM-ALBUTEROL 0.5-2.5 (3) MG/3ML IN SOLN
3.0000 mL | Freq: Three times a day (TID) | RESPIRATORY_TRACT | Status: DC
  Administered 2018-11-07 – 2018-11-09 (×6): 3 mL via RESPIRATORY_TRACT
  Filled 2018-11-07 (×6): qty 3

## 2018-11-07 MED ORDER — MELATONIN 3 MG PO TABS
4.5000 mg | ORAL_TABLET | Freq: Every day | ORAL | Status: DC
  Administered 2018-11-07 – 2018-11-10 (×4): 4.5 mg via ORAL
  Filled 2018-11-07 (×6): qty 1.5

## 2018-11-07 MED ORDER — ONDANSETRON HCL 4 MG/2ML IJ SOLN: 4.0000 mg | Freq: Three times a day (TID) | INTRAMUSCULAR | Status: DC | PRN

## 2018-11-07 MED ORDER — MAGNESIUM SULFATE 2 GM/50ML IV SOLN
2.0000 g | Freq: Once | INTRAVENOUS | Status: AC
  Administered 2018-11-07: 2 g via INTRAVENOUS
  Filled 2018-11-07: qty 50

## 2018-11-07 NOTE — ED Notes (Signed)
Attempted report 

## 2018-11-07 NOTE — ED Notes (Signed)
TELE 

## 2018-11-07 NOTE — ED Notes (Signed)
Messaged MD about patient requesting something for sleep. Waiting orders.

## 2018-11-07 NOTE — Progress Notes (Signed)
Inpatient Diabetes Program Recommendations  AACE/ADA: New Consensus Statement on Inpatient Glycemic Control (2015)  Target Ranges:  Prepandial:   less than 140 mg/dL      Peak postprandial:   less than 180 mg/dL (1-2 hours)      Critically ill patients:  140 - 180 mg/dL   Lab Results  Component Value Date   GLUCAP 277 (H) 11/07/2018   HGBA1C 6.3 (H) 08/29/2018    Review of Glycemic Control Results for Leslie Gamble, Leslie Gamble (MRN [id number]) as of 11/07/2018 09:50  Ref. Range 11/06/2018 21:55 11/07/2018 08:07  Glucose-Capillary Latest Ref Range: 70 - 99 mg/dL 138 (H) 277 (H)   Diabetes history: Type 2 DM  Outpatient Diabetes medications:  Novolog 0-9 units tid with meals, Metformin 1000 mg bid Current orders for Inpatient glycemic control:  Novolog sensitive tid with meals and HS Solumedrol 60 mg IV q 12 hoiurs  Inpatient Diabetes Program Recommendations:    Please consider adding Levemir 8 units bid.  Also consider increasing Novolog correction to resistant tid with meals and HS.   Thanks  Adah Perl, RN, BC-ADM Inpatient Diabetes Coordinator Pager [phone] (8a-5p)

## 2018-11-07 NOTE — ED Notes (Signed)
Notified resp therapy breathing treatment due at 8:00 overdue. State they will be by to begin treatment soon.

## 2018-11-07 NOTE — Progress Notes (Signed)
[facility]  PROGRESS NOTE    MARENDA ACCARDI  VOJ:[id number] DOB: Sep 01, 1940 DOA: 11/06/2018 PCP: Wenda Low, MD   Brief Narrative:   TOKIKO DIEFENDERFER is a 78 y.o. female with medical history significant of COPD, asthma, hypertension, hyperlipidemia, diabetes mellitus, GERD, depression, diverticulitis, RA, who presents with shortness of breath, cough.  Patient states that she has been having shortness breath in the past several days, which has been progressively worsening.  She has wheezing and coughs up white-colored mucus.  Denies chest pain, fever or chills.  She has nausea, no vomiting, diarrhea or abdominal pain.  No dysuria, but reports having urinary frequency sometimes.  No unilateral weakness.  10/19: HTN-ive ON. Says breathing Tx helping.   Assessment & Plan:   Principal Problem:   Acute on chronic respiratory failure with hypoxia (HCC) Active Problems:   Depression   Hypokalemia   Diabetes mellitus without complication (HCC)   HLD (hyperlipidemia)   GERD (gastroesophageal reflux disease)   COPD with acute exacerbation (H[facility])   HCAP (healthcare-associated pneumonia)   RA (rheumatoid arthritis) (H[facility])   Pleural effusion on right   Acute on chronic respiratory failure with hypoxia COPD exacerbation HAP Pleural effusion     - XHB:ZJIRCVEL right lower lobe and right middle lobe infiltration, cannot completely rule out HCAP; moderate right pleural effusion     - COVID negative     - on cefepime/vanc     - continue Breo-Ellipta, duonebs, IS     - PRN albuterol, mucinex     - Bld Cx pending, RVP pending, HIV negative  RA (rheumatoid arthritis):     - continue Plaquenil and Arava  Hypokalemia:     - K+ 3.1 at admission; repleted, repeat is 3.8     - Mg2+ 1.2; add Mg2+ IV  Depression and anxiety:      - no SI or HI     - cymbalta  Diabetes mellitus without complication     - Last F8B 6.3 on 08/29/18     - Patient is taking metformin and NovoLog at home     - SSI   HLD (hyperlipidemia):     - Pravastatin  GERD (gastroesophageal reflux disease):     - Protonix  DVT prophylaxis: heparin Code Status: FULL Family Communication: With son at bedside   Disposition Plan: TBD  Antimicrobials:  . Cefepime, vanc   ROS:  Denies CP, N, V. Reports dyspnea. Remainder 10-pt ROS is negative for all not previously mentioned.  Subjective: "The nurse has all that information."  Objective: Vitals:   11/07/18 0830 11/07/18 0915 11/07/18 0956 11/07/18 1408  BP: (!) 141/65 (!) 166/90    Pulse: 92 93    Resp: (!) 22 (!) 24    Temp:      SpO2: 92% 94% 95% 96%  Weight:      Height:        Intake/Output Summary (Last 24 hours) at 11/07/2018 1450 Last data filed at 11/07/2018 1110 Gross per 24 hour  Intake 650 ml  Output -  Net 650 ml   Filed Weights   11/06/18 1900  Weight: 74.9 kg    Examination:  General: 78 y.o. female resting in bed in NAD Cardiovascular: tachy, +S1, S2, no m/g/r, equal pulses throughout Respiratory: course, decreased at bases GI: BS+, NDNT, no masses noted, no organomegaly noted MSK: No e/c/c Skin: No rashes, bruises, ulcerations noted Neuro: Alert to name, follows commands Psyc: Appropriate interaction and affect, calm/cooperative   Data  Reviewed: I have personally reviewed following labs and imaging studies.  CBC: Recent Labs  Lab 11/06/18 1653 11/07/18 1018  WBC 12.0* 5.4  NEUTROABS 9.2* 4.7  HGB 13.3 13.2  HCT 41.3 39.2  MCV 86.0 85.0  PLT 370 035   Basic Metabolic Panel: Recent Labs  Lab 11/06/18 1653 11/07/18 0500 11/07/18 1018  NA 138  --  137  K 3.1*  --  3.8  CL 101  --  103  CO2 20*  --  21*  GLUCOSE 197*  --  308*  BUN 14  --  16  CREATININE 0.83  --  0.67  CALCIUM 9.0  --  8.7*  MG  --  1.2*  --   PHOS  --   --  2.1*   GFR: Estimated Creatinine Clearance: 63.2 mL/min (by C-G formula based on SCr of 0.67 mg/dL). Liver Function Tests: Recent Labs  Lab 11/07/18 1018  ALBUMIN 3.2*    No results for input(s): LIPASE, AMYLASE in the last 168 hours. No results for input(s): AMMONIA in the last 168 hours. Coagulation Profile: No results for input(s): INR, PROTIME in the last 168 hours. Cardiac Enzymes: No results for input(s): CKTOTAL, CKMB, CKMBINDEX, TROPONINI in the last 168 hours. BNP (last 3 results) No results for input(s): PROBNP in the last 8760 hours. HbA1C: No results for input(s): HGBA1C in the last 72 hours. CBG: Recent Labs  Lab 11/06/18 2155 11/07/18 0807 11/07/18 1232  GLUCAP 138* 277* 264*   Lipid Profile: No results for input(s): CHOL, HDL, LDLCALC, TRIG, CHOLHDL, LDLDIRECT in the last 72 hours. Thyroid Function Tests: No results for input(s): TSH, T4TOTAL, FREET4, T3FREE, THYROIDAB in the last 72 hours. Anemia Panel: No results for input(s): VITAMINB12, FOLATE, FERRITIN, TIBC, IRON, RETICCTPCT in the last 72 hours. Sepsis Labs: No results for input(s): PROCALCITON, LATICACIDVEN in the last 168 hours.  Recent Results (from the past 240 hour(s))  SARS Coronavirus 2 by RT PCR (hospital order, performed in [facility] hospital lab) Nasopharyngeal Nasopharyngeal Swab     Status: None   Collection Time: 11/06/18 11:05 PM   Specimen: Nasopharyngeal Swab  Result Value Ref Range Status   SARS Coronavirus 2 NEGATIVE NEGATIVE Final    Comment: (NOTE) If result is NEGATIVE SARS-CoV-2 target nucleic acids are NOT DETECTED. The SARS-CoV-2 RNA is generally detectable in upper and lower  respiratory specimens during the acute phase of infection. The lowest  concentration of SARS-CoV-2 viral copies this assay can detect is 250  copies / mL. A negative result does not preclude SARS-CoV-2 infection  and should not be used as the sole basis for treatment or other  patient management decisions.  A negative result may occur with  improper specimen collection / handling, submission of specimen other  than nasopharyngeal swab, presence of viral mutation(s)  within the  areas targeted by this assay, and inadequate number of viral copies  (<250 copies / mL). A negative result must be combined with clinical  observations, patient history, and epidemiological information. If result is POSITIVE SARS-CoV-2 target nucleic acids are DETECTED. The SARS-CoV-2 RNA is generally detectable in upper and lower  respiratory specimens dur ing the acute phase of infection.  Positive  results are indicative of active infection with SARS-CoV-2.  Clinical  correlation with patient history and other diagnostic information is  necessary to determine patient infection status.  Positive results do  not rule out bacterial infection or co-infection with other viruses. If result is PRESUMPTIVE POSTIVE SARS-CoV-2 nucleic  acids MAY BE PRESENT.   A presumptive positive result was obtained on the submitted specimen  and confirmed on repeat testing.  While 2019 novel coronavirus  (SARS-CoV-2) nucleic acids may be present in the submitted sample  additional confirmatory testing may be necessary for epidemiological  and / or clinical management purposes  to differentiate between  SARS-CoV-2 and other Sarbecovirus currently known to infect humans.  If clinically indicated additional testing with an alternate test  methodology [phone]) is advised. The SARS-CoV-2 RNA is generally  detectable in upper and lower respiratory sp ecimens during the acute  phase of infection. The expected result is Negative. Fact Sheet for Patients:  StrictlyIdeas.no Fact Sheet for Healthcare Providers: BankingDealers.co.za This test is not yet approved or cleared by the [place] FDA and has been authorized for detection and/or diagnosis of SARS-CoV-2 by FDA under an Emergency Use Authorization (EUA).  This EUA will remain in effect (meaning this test can be used) for the duration of the COVID-19 declaration under Section 564(b)(1) of the Act,  21 U.S.C. section 360bbb-3(b)(1), unless the authorization is terminated or revoked sooner. Performed at [facility], [place] [address], [place], [place] [address]       Radiology Studies: Dg Chest 2 View  Result Date: 11/06/2018 CLINICAL DATA:  78 year old female with history of shortness of breath. EXAM: CHEST - 2 VIEW COMPARISON:  Chest x-ray 10/06/2018. FINDINGS: Moderate right pleural effusion. Opacity at the right base which may reflect areas of atelectasis and/or consolidation. Left lung is clear, with exception of minimal scarring in the left mid lung (unchanged). No left pleural effusion. No evidence of pulmonary edema. Heart size is normal. Upper mediastinal contours are within normal limits. Aortic atherosclerosis. IMPRESSION: 1. Moderate right pleural effusion with areas of atelectasis and/or consolidation in the right middle and lower lobe. 2. Aortic atherosclerosis. Electronically Signed   By: Vinnie Langton M.D.   On: 11/06/2018 18:13     Scheduled Meds: . amLODipine  10 mg Oral BID  . aspirin EC  81 mg Oral q morning - 10a  . DULoxetine  60 mg Oral BID  . ferrous sulfate  325 mg Oral q morning - 10a  . fluticasone furoate-vilanterol  1 puff Inhalation q morning - 10a  . heparin  5,000 Units Subcutaneous Q8H  . insulin aspart  0-5 Units Subcutaneous QHS  . insulin aspart  0-9 Units Subcutaneous TID WC  . ipratropium-albuterol  3 mL Nebulization TID  . leflunomide  20 mg Oral q morning - 10a  . losartan  100 mg Oral q morning - 10a  . methylPREDNISolone (SOLU-MEDROL) injection  60 mg Intravenous Q12H  . multivitamin with minerals  1 tablet Oral q morning - 10a  . nystatin cream   Topical BID  . pantoprazole  40 mg Oral q morning - 10a  . pravastatin  10 mg Oral q morning - 10a   Continuous Infusions: . ceFEPime (MAXIPIME) IV Stopped (11/07/18 0913)  . vancomycin       LOS: 1 day    Time spent: 35 minutes spent in the coordination of care today.     Jonnie Finner, DO [facility] Pager [phone]  If 7PM-7AM, please contact night-coverage [email or web address] Password [email or web address] 11/07/2018, 2:50 PM

## 2018-11-07 NOTE — ED Notes (Signed)
Paged MD about patient's nausea. Waiting for orders.

## 2018-11-07 NOTE — ED Notes (Signed)
Checked patient cbg it was 50 notified RN Nikki of blood sugar

## 2018-11-08 LAB — RESPIRATORY PANEL BY PCR

## 2018-11-08 LAB — CBC WITH DIFFERENTIAL/PLATELET
Abs Immature Granulocytes: 0.04 10*3/uL (ref 0.00–0.07)
Basophils Absolute: 0 10*3/uL (ref 0.0–0.1)
Basophils Relative: 0 %
Eosinophils Absolute: 0 10*3/uL (ref 0.0–0.5)
Eosinophils Relative: 0 %
HCT: 38.3 % (ref 36.0–46.0)
Hemoglobin: 12.5 g/dL (ref 12.0–15.0)
Immature Granulocytes: 0 %
Lymphocytes Relative: 8 %
Lymphs Abs: 0.8 10*3/uL (ref 0.7–4.0)
MCH: 27.8 pg (ref 26.0–34.0)
MCHC: 32.6 g/dL (ref 30.0–36.0)
MCV: 85.1 fL (ref 80.0–100.0)
Monocytes Absolute: 0.2 10*3/uL (ref 0.1–1.0)
Monocytes Relative: 2 %
Neutro Abs: 9.7 10*3/uL — ABNORMAL HIGH (ref 1.7–7.7)
Neutrophils Relative %: 90 %
Platelets: 412 10*3/uL — ABNORMAL HIGH (ref 150–400)
RBC: 4.5 MIL/uL (ref 3.87–5.11)
RDW: 14.4 % (ref 11.5–15.5)
WBC: 10.8 10*3/uL — ABNORMAL HIGH (ref 4.0–10.5)
nRBC: 0 % (ref 0.0–0.2)

## 2018-11-08 LAB — RENAL FUNCTION PANEL
Albumin: 3.2 g/dL — ABNORMAL LOW (ref 3.5–5.0)
Anion gap: 13 (ref 5–15)
BUN: 18 mg/dL (ref 8–23)
CO2: 23 mmol/L (ref 22–32)
Calcium: 8.6 mg/dL — ABNORMAL LOW (ref 8.9–10.3)
Chloride: 103 mmol/L (ref 98–111)
Creatinine, Ser: 0.71 mg/dL (ref 0.44–1.00)
GFR calc Af Amer: >60 mL/min (ref >60)
GFR calc non Af Amer: >60 mL/min (ref >60)
Glucose, Bld: 271 mg/dL — ABNORMAL HIGH (ref 70–99)
Phosphorus: 3.3 mg/dL (ref 2.5–4.6)
Potassium: 3.7 mmol/L (ref 3.5–5.1)
Sodium: 139 mmol/L (ref 135–145)

## 2018-11-08 LAB — LEGIONELLA PNEUMOPHILA SEROGP 1 UR AG: L. pneumophila Serogp 1 Ur Ag: NEGATIVE

## 2018-11-08 LAB — GLUCOSE, CAPILLARY
Glucose-Capillary: 238 mg/dL — ABNORMAL HIGH (ref 70–99)
Glucose-Capillary: 415 mg/dL — ABNORMAL HIGH (ref 70–99)
Glucose-Capillary: 217 mg/dL — ABNORMAL HIGH (ref 70–99)
Glucose-Capillary: 215 mg/dL — ABNORMAL HIGH (ref 70–99)

## 2018-11-08 LAB — INFLUENZA PANEL BY PCR (TYPE A & B)
Influenza A By PCR: NEGATIVE
Influenza B By PCR: NEGATIVE

## 2018-11-08 LAB — MAGNESIUM: Magnesium: 1.6 mg/dL — ABNORMAL LOW (ref 1.7–2.4)

## 2018-11-08 MED ORDER — MAGNESIUM OXIDE 400 (241.3 MG) MG PO TABS
400.0000 mg | ORAL_TABLET | Freq: Two times a day (BID) | ORAL | Status: DC
  Administered 2018-11-08 – 2018-11-11 (×7): 400 mg via ORAL
  Filled 2018-11-08 (×7): qty 1

## 2018-11-08 MED ORDER — METHYLPREDNISOLONE SODIUM SUCC 125 MG IJ SOLR
60.0000 mg | Freq: Every day | INTRAMUSCULAR | Status: DC
  Administered 2018-11-09 – 2018-11-11 (×3): 60 mg via INTRAVENOUS
  Filled 2018-11-08 (×3): qty 2

## 2018-11-08 MED ORDER — INSULIN ASPART 100 UNIT/ML ~~LOC~~ SOLN
0.0000 [IU] | Freq: Three times a day (TID) | SUBCUTANEOUS | Status: DC
  Administered 2018-11-08: 7 [IU] via SUBCUTANEOUS
  Administered 2018-11-08: 20 [IU] via SUBCUTANEOUS
  Administered 2018-11-09: 7 [IU] via SUBCUTANEOUS
  Administered 2018-11-09: 4 [IU] via SUBCUTANEOUS
  Administered 2018-11-09: 3 [IU] via SUBCUTANEOUS
  Administered 2018-11-10 (×2): 4 [IU] via SUBCUTANEOUS
  Administered 2018-11-10: 7 [IU] via SUBCUTANEOUS
  Administered 2018-11-11: 4 [IU] via SUBCUTANEOUS

## 2018-11-08 MED ORDER — INSULIN ASPART 100 UNIT/ML ~~LOC~~ SOLN
0.0000 [IU] | Freq: Every day | SUBCUTANEOUS | Status: DC
  Administered 2018-11-08: 2 [IU] via SUBCUTANEOUS
  Administered 2018-11-09: 3 [IU] via SUBCUTANEOUS
  Administered 2018-11-10: 2 [IU] via SUBCUTANEOUS

## 2018-11-08 MED ORDER — INSULIN DETEMIR 100 UNIT/ML ~~LOC~~ SOLN
8.0000 [IU] | Freq: Two times a day (BID) | SUBCUTANEOUS | Status: DC
  Administered 2018-11-08 – 2018-11-09 (×3): 8 [IU] via SUBCUTANEOUS
  Filled 2018-11-08 (×4): qty 0.08

## 2018-11-08 NOTE — Plan of Care (Signed)
  Problem: Education: Goal: Knowledge of General Education information will improve Description: Including pain rating scale, medication(s)/side effects and non-pharmacologic comfort measures Outcome: Progressing   Problem: Safety: Goal: Ability to remain free from injury will improve Outcome: Progressing   

## 2018-11-08 NOTE — Progress Notes (Signed)
Mar[facility] Kitchen  PROGRESS NOTE    Leslie Gamble  BJY:[id number] DOB: [birth year] DOA: 11/06/2018 PCP: Wenda Low, MD   Brief Narrative:   Leslie Gamble a 78 y.o.femalewith medical history significant ofCOPD,asthma, hypertension, hyperlipidemia, diabetes mellitus, GERD, depression, diverticulitis, RA, who presents with shortness of breath, cough.  Patient states that she has been having shortness breath in the past several days, which has been progressively worsening. She has wheezing and coughs up white-colored mucus. Denies chest pain, fever or chills. She has nausea, no vomiting, diarrhea or abdominal pain. No dysuria, but reports having urinary frequency sometimes. No unilateral weakness.  10/19: HTN-ive ON. Says breathing Tx helping. 10/20: Says she is feeling better overall. No acute events ON.    Assessment & Plan:   Principal Problem:   Acute on chronic respiratory failure with hypoxia (HCC) Active Problems:   Depression   Hypokalemia   Diabetes mellitus without complication (HCC)   HLD (hyperlipidemia)   GERD (gastroesophageal reflux disease)   COPD with acute exacerbation (HCC)   HCAP (healthcare-associated pneumonia)   RA (rheumatoid arthritis) (HCC)   Pleural effusion on right  Acute on chronic respiratory failure with hypoxia COPD exacerbation HAP Pleural effusion     - YQM:VHQIONGE right lower lobe and right middle lobe infiltration, cannot completely rule out HCAP; moderate right pleural effusion     - COVID negative     - on cefepime/vanc     - continue Breo-Ellipta, duonebs, IS     - PRN albuterol, mucinex     - Bld Cx pending, RVP pending, HIV negative     - drop vanc, decrease steroids  RA (rheumatoid arthritis):     - continue Plaquenil and Arava  Hypokalemia:     - K+ 3.1 at admission; repleted, repeat is 3.8     - Mg2+ 1.2; add Mg2+ IV     - 10/20: K+ is ok this morning, will give a little more Mg2+   Depression and anxiety:      - no  SI or HI     - cymbalta  Diabetes mellitus without complication     - Last X5M 6.3 on 08/29/18     - Patient is taking metformin and NovoLog at home     - SSI     - add levimir  HLD (hyperlipidemia):     - Pravastatin  GERD (gastroesophageal reflux disease):     - Protonix  DVT prophylaxis: heparin Code Status: FULL Disposition Plan: TBD  Antimicrobials:   Cefepime, vanc   ROS:  Reports dyspnea improved, Denies N, V, CP. Remainder 10-pt ROS is negative for all not previously mentioned.  Subjective: "Now, don't send me home too early!"  Objective: Vitals:   11/07/18 2128 11/08/18 0450 11/08/18 0914 11/08/18 0947  BP: (!) 160/89 (!) 169/94  (!) 150/81  Pulse: 98 85  90  Resp: 18 18  18   Temp: 98.3 F (36.8 C) 97.7 F (36.5 C)  97.9 F (36.6 C)  TempSrc: Oral Oral  Oral  SpO2: 94% 95% 93% 95%  Weight:      Height:        Intake/Output Summary (Last 24 hours) at 11/08/2018 1050 Last data filed at 11/08/2018 0900 Gross per 24 hour  Intake 750 ml  Output 1150 ml  Net -400 ml   Filed Weights   11/06/18 1900 11/07/18 1546  Weight: 74.9 kg 67.9 kg    Examination:  General: 78 y.o. female resting in bed in  NAD Cardiovascular: tachy, +S1, S2, no m/g/r Respiratory: scattered rhonchi, but good air movement. GI: BS+, NDNT, no masses noted, no organomegaly noted MSK: No e/c/c Neuro: Alert to name, follows commands Psyc: Appropriate interaction and affect, calm/cooperative   Data Reviewed: I have personally reviewed following labs and imaging studies.  CBC: Recent Labs  Lab 11/06/18 1653 11/07/18 1018 11/08/18 0607  WBC 12.0* 5.4 10.8*  NEUTROABS 9.2* 4.7 9.7*  HGB 13.3 13.2 12.5  HCT 41.3 39.2 38.3  MCV 86.0 85.0 85.1  PLT 370 364 767*   Basic Metabolic Panel: Recent Labs  Lab 11/06/18 1653 11/07/18 0500 11/07/18 1018 11/08/18 0607  NA 138  --  137 139  K 3.1*  --  3.8 3.7  CL 101  --  103 103  CO2 20*  --  21* 23  GLUCOSE 197*  --   308* 271*  BUN 14  --  16 18  CREATININE 0.83  --  0.67 0.71  CALCIUM 9.0  --  8.7* 8.6*  MG  --  1.2*  --  1.6*  PHOS  --   --  2.1* 3.3   GFR: Estimated Creatinine Clearance: 56 mL/min (by C-G formula based on SCr of 0.71 mg/dL). Liver Function Tests: Recent Labs  Lab 11/07/18 1018 11/08/18 0607  ALBUMIN 3.2* 3.2*   No results for input(s): LIPASE, AMYLASE in the last 168 hours. No results for input(s): AMMONIA in the last 168 hours. Coagulation Profile: No results for input(s): INR, PROTIME in the last 168 hours. Cardiac Enzymes: No results for input(s): CKTOTAL, CKMB, CKMBINDEX, TROPONINI in the last 168 hours. BNP (last 3 results) No results for input(s): PROBNP in the last 8760 hours. HbA1C: No results for input(s): HGBA1C in the last 72 hours. CBG: Recent Labs  Lab 11/07/18 0807 11/07/18 1232 11/07/18 1600 11/07/18 2127 11/08/18 0749  GLUCAP 277* 264* 326* 331* 238*   Lipid Profile: No results for input(s): CHOL, HDL, LDLCALC, TRIG, CHOLHDL, LDLDIRECT in the last 72 hours. Thyroid Function Tests: No results for input(s): TSH, T4TOTAL, FREET4, T3FREE, THYROIDAB in the last 72 hours. Anemia Panel: No results for input(s): VITAMINB12, FOLATE, FERRITIN, TIBC, IRON, RETICCTPCT in the last 72 hours. Sepsis Labs: No results for input(s): PROCALCITON, LATICACIDVEN in the last 168 hours.  Recent Results (from the past 240 hour(s))  Respiratory Panel by PCR     Status: None   Collection Time: 11/06/18 10:42 PM   Specimen: Nasopharyngeal Swab; Respiratory  Result Value Ref Range Status   Adenovirus NOT DETECTED NOT DETECTED Final   Coronavirus 229E NOT DETECTED NOT DETECTED Final    Comment: (NOTE) The Coronavirus on the Respiratory Panel, DOES NOT test for the novel  Coronavirus (2019 nCoV)    Coronavirus HKU1 NOT DETECTED NOT DETECTED Final   Coronavirus NL63 NOT DETECTED NOT DETECTED Final   Coronavirus OC43 NOT DETECTED NOT DETECTED Final   Metapneumovirus  NOT DETECTED NOT DETECTED Final   Rhinovirus / Enterovirus NOT DETECTED NOT DETECTED Final   Influenza A NOT DETECTED NOT DETECTED Final   Influenza B NOT DETECTED NOT DETECTED Final   Parainfluenza Virus 1 NOT DETECTED NOT DETECTED Final   Parainfluenza Virus 2 NOT DETECTED NOT DETECTED Final   Parainfluenza Virus 3 NOT DETECTED NOT DETECTED Final   Parainfluenza Virus 4 NOT DETECTED NOT DETECTED Final   Respiratory Syncytial Virus NOT DETECTED NOT DETECTED Final   Bordetella pertussis NOT DETECTED NOT DETECTED Final   Chlamydophila pneumoniae NOT DETECTED NOT DETECTED Final  Mycoplasma pneumoniae NOT DETECTED NOT DETECTED Final    Comment: Performed at [facility], [place] [address] [place], [place] [address]  SARS Coronavirus 2 by RT PCR (hospital order, performed in [facility] hospital lab) Nasopharyngeal Nasopharyngeal Swab     Status: None   Collection Time: 11/06/18 11:05 PM   Specimen: Nasopharyngeal Swab  Result Value Ref Range Status   SARS Coronavirus 2 NEGATIVE NEGATIVE Final    Comment: (NOTE) If result is NEGATIVE SARS-CoV-2 target nucleic acids are NOT DETECTED. The SARS-CoV-2 RNA is generally detectable in upper and lower  respiratory specimens during the acute phase of infection. The lowest  concentration of SARS-CoV-2 viral copies this assay can detect is 250  copies / mL. A negative result does not preclude SARS-CoV-2 infection  and should not be used as the sole basis for treatment or other  patient management decisions.  A negative result may occur with  improper specimen collection / handling, submission of specimen other  than nasopharyngeal swab, presence of viral mutation(s) within the  areas targeted by this assay, and inadequate number of viral copies  (<250 copies / mL). A negative result must be combined with clinical  observations, patient history, and epidemiological information. If result is POSITIVE SARS-CoV-2 target nucleic acids are DETECTED.  The SARS-CoV-2 RNA is generally detectable in upper and lower  respiratory specimens dur ing the acute phase of infection.  Positive  results are indicative of active infection with SARS-CoV-2.  Clinical  correlation with patient history and other diagnostic information is  necessary to determine patient infection status.  Positive results do  not rule out bacterial infection or co-infection with other viruses. If result is PRESUMPTIVE POSTIVE SARS-CoV-2 nucleic acids MAY BE PRESENT.   A presumptive positive result was obtained on the submitted specimen  and confirmed on repeat testing.  While 2019 novel coronavirus  (SARS-CoV-2) nucleic acids may be present in the submitted sample  additional confirmatory testing may be necessary for epidemiological  and / or clinical management purposes  to differentiate between  SARS-CoV-2 and other Sarbecovirus currently known to infect humans.  If clinically indicated additional testing with an alternate test  methodology [phone]) is advised. The SARS-CoV-2 RNA is generally  detectable in upper and lower respiratory sp ecimens during the acute  phase of infection. The expected result is Negative. Fact Sheet for Patients:  StrictlyIdeas.no Fact Sheet for Healthcare Providers: BankingDealers.co.za This test is not yet approved or cleared by the [place] FDA and has been authorized for detection and/or diagnosis of SARS-CoV-2 by FDA under an Emergency Use Authorization (EUA).  This EUA will remain in effect (meaning this test can be used) for the duration of the COVID-19 declaration under Section 564(b)(1) of the Act, 21 U.S.C. section 360bbb-3(b)(1), unless the authorization is terminated or revoked sooner. Performed at [facility], [place] [address], [place], [place] [address]   Culture, blood (Routine X 2) w Reflex to ID Panel     Status: None (Preliminary result)   Collection Time:  11/07/18  1:00 AM   Specimen: BLOOD RIGHT FOREARM  Result Value Ref Range Status   Specimen Description BLOOD RIGHT FOREARM  Final   Special Requests   Final    BOTTLES DRAWN AEROBIC AND ANAEROBIC Blood Culture adequate volume   Culture   Final    NO GROWTH 1 DAY Performed at [facility], [place] [address], [place],  [address]    Report Status PENDING  Incomplete  Culture, blood (Routine X 2) w Reflex to ID Panel     Status: None (Preliminary result)   Collection Time: 11/07/18  1:08 AM   Specimen: BLOOD LEFT HAND  Result Value Ref Range Status   Specimen Description BLOOD LEFT HAND  Final   Special Requests   Final    BOTTLES DRAWN AEROBIC AND ANAEROBIC Blood Culture adequate volume   Culture   Final    NO GROWTH 1 DAY Performed at [facility], [place] [address] [place], [place] [address]    Report Status PENDING  Incomplete      Radiology Studies: Dg Chest 2 View  Result Date: 11/06/2018 CLINICAL DATA:  78 year old female with history of shortness of breath. EXAM: CHEST - 2 VIEW COMPARISON:  Chest x-ray 10/06/2018. FINDINGS: Moderate right pleural effusion. Opacity at the right base which may reflect areas of atelectasis and/or consolidation. Left lung is clear, with exception of minimal scarring in the left mid lung (unchanged). No left pleural effusion. No evidence of pulmonary edema. Heart size is normal. Upper mediastinal contours are within normal limits. Aortic atherosclerosis. IMPRESSION: 1. Moderate right pleural effusion with areas of atelectasis and/or consolidation in the right middle and lower lobe. 2. Aortic atherosclerosis. Electronically Signed   By: Vinnie Langton M.D.   On: 11/06/2018 18:13     Scheduled Meds: . amLODipine  10 mg Oral BID  . aspirin EC  81 mg Oral q morning - 10a  . DULoxetine  60 mg Oral BID  . ferrous sulfate  325 mg Oral q morning - 10a  . fluticasone furoate-vilanterol  1 puff Inhalation q morning - 10a  . heparin   5,000 Units Subcutaneous Q8H  . insulin aspart  0-5 Units Subcutaneous QHS  . insulin aspart  0-9 Units Subcutaneous TID WC  . ipratropium-albuterol  3 mL Nebulization TID  . leflunomide  20 mg Oral q morning - 10a  . losartan  100 mg Oral q morning - 10a  . Melatonin  4.5 mg Oral QHS  . [START ON 11/09/2018] methylPREDNISolone (SOLU-MEDROL) injection  60 mg Intravenous Daily  . multivitamin with minerals  1 tablet Oral q morning - 10a  . pantoprazole  40 mg Oral q morning - 10a  . pravastatin  10 mg Oral q morning - 10a   Continuous Infusions: . ceFEPime (MAXIPIME) IV 2 g (11/08/18 0826)  . vancomycin 1,250 mg (11/07/18 2234)     LOS: 2 days    Time spent: 25 minutes spent in the coordination of care today.    Jonnie Finner, DO [facility] Pager [phone]  If 7PM-7AM, please contact night-coverage [email or web address] Password [email or web address] 11/08/2018, 10:50 AM

## 2018-11-09 ENCOUNTER — Inpatient Hospital Stay (HOSPITAL_COMMUNITY): Payer: Medicare Other

## 2018-11-09 DIAGNOSIS — E119 Type 2 diabetes mellitus without complications: Secondary | ICD-10-CM

## 2018-11-09 DIAGNOSIS — M069 Rheumatoid arthritis, unspecified: Secondary | ICD-10-CM

## 2018-11-09 DIAGNOSIS — J189 Pneumonia, unspecified organism: Principal | ICD-10-CM

## 2018-11-09 DIAGNOSIS — J441 Chronic obstructive pulmonary disease with (acute) exacerbation: Secondary | ICD-10-CM

## 2018-11-09 DIAGNOSIS — J90 Pleural effusion, not elsewhere classified: Secondary | ICD-10-CM

## 2018-11-09 DIAGNOSIS — K219 Gastro-esophageal reflux disease without esophagitis: Secondary | ICD-10-CM

## 2018-11-09 DIAGNOSIS — E876 Hypokalemia: Secondary | ICD-10-CM

## 2018-11-09 DIAGNOSIS — J9621 Acute and chronic respiratory failure with hypoxia: Secondary | ICD-10-CM

## 2018-11-09 LAB — RENAL FUNCTION PANEL
Albumin: 3 g/dL — ABNORMAL LOW (ref 3.5–5.0)
Anion gap: 11 (ref 5–15)
BUN: 19 mg/dL (ref 8–23)
CO2: 24 mmol/L (ref 22–32)
Calcium: 8.5 mg/dL — ABNORMAL LOW (ref 8.9–10.3)
Chloride: 106 mmol/L (ref 98–111)
Creatinine, Ser: 0.68 mg/dL (ref 0.44–1.00)
GFR calc Af Amer: >60 mL/min (ref >60)
GFR calc non Af Amer: >60 mL/min (ref >60)
Glucose, Bld: 155 mg/dL — ABNORMAL HIGH (ref 70–99)
Phosphorus: 1.9 mg/dL — ABNORMAL LOW (ref 2.5–4.6)
Potassium: 3.4 mmol/L — ABNORMAL LOW (ref 3.5–5.1)
Sodium: 141 mmol/L (ref 135–145)

## 2018-11-09 LAB — CBC WITH DIFFERENTIAL/PLATELET
Abs Immature Granulocytes: 0.07 10*3/uL (ref 0.00–0.07)
Basophils Absolute: 0 10*3/uL (ref 0.0–0.1)
Basophils Relative: 0 %
Eosinophils Absolute: 0 10*3/uL (ref 0.0–0.5)
Eosinophils Relative: 0 %
HCT: 39.2 % (ref 36.0–46.0)
Hemoglobin: 12.9 g/dL (ref 12.0–15.0)
Immature Granulocytes: 0 %
Lymphocytes Relative: 12 %
Lymphs Abs: 2 10*3/uL (ref 0.7–4.0)
MCH: 27.9 pg (ref 26.0–34.0)
MCHC: 32.9 g/dL (ref 30.0–36.0)
MCV: 84.7 fL (ref 80.0–100.0)
Monocytes Absolute: 0.9 10*3/uL (ref 0.1–1.0)
Monocytes Relative: 6 %
Neutro Abs: 12.8 10*3/uL — ABNORMAL HIGH (ref 1.7–7.7)
Neutrophils Relative %: 82 %
Platelets: 412 10*3/uL — ABNORMAL HIGH (ref 150–400)
RBC: 4.63 MIL/uL (ref 3.87–5.11)
RDW: 14.6 % (ref 11.5–15.5)
WBC: 15.7 10*3/uL — ABNORMAL HIGH (ref 4.0–10.5)
nRBC: 0 % (ref 0.0–0.2)

## 2018-11-09 LAB — GLUCOSE, CAPILLARY
Glucose-Capillary: 136 mg/dL — ABNORMAL HIGH (ref 70–99)
Glucose-Capillary: 190 mg/dL — ABNORMAL HIGH (ref 70–99)
Glucose-Capillary: 238 mg/dL — ABNORMAL HIGH (ref 70–99)
Glucose-Capillary: 282 mg/dL — ABNORMAL HIGH (ref 70–99)

## 2018-11-09 LAB — MAGNESIUM: Magnesium: 1.7 mg/dL (ref 1.7–2.4)

## 2018-11-09 MED ORDER — INSULIN ASPART 100 UNIT/ML ~~LOC~~ SOLN
2.0000 [IU] | Freq: Three times a day (TID) | SUBCUTANEOUS | Status: DC
  Administered 2018-11-10 – 2018-11-11 (×5): 2 [IU] via SUBCUTANEOUS

## 2018-11-09 MED ORDER — ACETAMINOPHEN 500 MG PO TABS
1000.0000 mg | ORAL_TABLET | Freq: Every day | ORAL | Status: DC
  Administered 2018-11-09 – 2018-11-10 (×2): 1000 mg via ORAL
  Filled 2018-11-09 (×2): qty 2

## 2018-11-09 MED ORDER — HYDRALAZINE HCL 25 MG PO TABS
25.0000 mg | ORAL_TABLET | Freq: Three times a day (TID) | ORAL | Status: DC | PRN
  Administered 2018-11-11: 25 mg via ORAL
  Filled 2018-11-09: qty 1

## 2018-11-09 MED ORDER — IPRATROPIUM-ALBUTEROL 0.5-2.5 (3) MG/3ML IN SOLN
3.0000 mL | Freq: Two times a day (BID) | RESPIRATORY_TRACT | Status: DC
  Administered 2018-11-09 – 2018-11-10 (×2): 3 mL via RESPIRATORY_TRACT
  Filled 2018-11-09 (×2): qty 3

## 2018-11-09 MED ORDER — INSULIN DETEMIR 100 UNIT/ML ~~LOC~~ SOLN
12.0000 [IU] | Freq: Two times a day (BID) | SUBCUTANEOUS | Status: DC
  Administered 2018-11-09 – 2018-11-11 (×4): 12 [IU] via SUBCUTANEOUS
  Filled 2018-11-09 (×5): qty 0.12

## 2018-11-09 MED ORDER — MIRTAZAPINE 15 MG PO TABS
7.5000 mg | ORAL_TABLET | Freq: Every day | ORAL | Status: DC
  Administered 2018-11-09 – 2018-11-10 (×2): 7.5 mg via ORAL
  Filled 2018-11-09 (×2): qty 1

## 2018-11-09 NOTE — Plan of Care (Signed)
  Problem: Clinical Measurements: Goal: Ability to maintain clinical measurements within normal limits will improve Outcome: Progressing   

## 2018-11-09 NOTE — Evaluation (Signed)
Physical Therapy Evaluation Patient Details Name: Leslie Gamble MRN: [id number] DOB: [birth year] Today's Date: 11/09/2018   History of Present Illness  Pt is a 78 y/o female admitted secondary to acute on chronic respiratory failure. Found to have R pleural effusion. PMH includes HTN, COPD, RA, DM, and depression.   Clinical Impression  Pt admitted secondary to problem above with deficits below. Pt requiring min guard for mobility using RW this session. Pt reports breathing better than before admission; did not note any SOB, however, pt did report fatigue following gait. Pt reports son can assist as needed. Will continue to follow acutely to maximize functional mobility independence and safety.     Follow Up Recommendations Home health PT    Equipment Recommendations  None recommended by PT    Recommendations for Other Services       Precautions / Restrictions Precautions Precautions: Fall Restrictions Weight Bearing Restrictions: No      Mobility  Bed Mobility Overal bed mobility: Needs Assistance Bed Mobility: Supine to Sit;Sit to Supine     Supine to sit: Supervision Sit to supine: Supervision   General bed mobility comments: Supervision for safety and line management.   Transfers Overall transfer level: Needs assistance Equipment used: Rolling walker (2 wheeled);None Transfers: Sit to/from [employer] to Stand: Min guard Stand pivot transfers: Min guard       General transfer comment: Min guard for safety to perform transfers to [facility]. No overt LOB noted.   Ambulation/Gait Ambulation/Gait assistance: Min guard Gait Distance (Feet): 130 Feet Assistive device: Rolling walker (2 wheeled) Gait Pattern/deviations: Step-through pattern;Decreased stride length Gait velocity: Decreased   General Gait Details: Slow, cautious gait, however, overall steady. No SOB noted during gait and pt reports breathing had improved.   Stairs             Wheelchair Mobility    Modified Rankin (Stroke Patients Only)       Balance Overall balance assessment: Needs assistance Sitting-balance support: No upper extremity supported;Feet supported Sitting balance-Leahy Scale: Good     Standing balance support: Bilateral upper extremity supported;During functional activity Standing balance-Leahy Scale: Poor Standing balance comment: Reliant on BUE support                              Pertinent Vitals/Pain Pain Assessment: No/denies pain    Home Living Family/patient expects to be discharged to:: Private residence Living Arrangements: Children Available Help at Discharge: Family;Available PRN/intermittently Type of Home: House Home Access: Stairs to enter Entrance Stairs-Rails: Left Entrance Stairs-Number of Steps: 4 Home Layout: One level Home Equipment: Cane - single point;Shower seat;Grab bars - tub/shower;Walker - 4 wheels      Prior Function Level of Independence: Independent with assistive device(s)         Comments: Pt uses rollator for mobility      Hand Dominance        Extremity/Trunk Assessment   Upper Extremity Assessment Upper Extremity Assessment: Overall WFL for tasks assessed    Lower Extremity Assessment Lower Extremity Assessment: Generalized weakness    Cervical / Trunk Assessment Cervical / Trunk Assessment: Normal  Communication   Communication: No difficulties  Cognition Arousal/Alertness: Awake/alert Behavior During Therapy: WFL for tasks assessed/performed Overall Cognitive Status: Within Functional Limits for tasks assessed  General Comments General comments (skin integrity, edema, etc.): Pt's son present throughout session.     Exercises     Assessment/Plan    PT Assessment Patient needs continued PT services  PT Problem List Decreased strength;Decreased balance;Decreased mobility;Decreased knowledge of use  of DME;Decreased activity tolerance       PT Treatment Interventions Gait training;DME instruction;Stair training;Functional mobility training;Therapeutic activities;Therapeutic exercise;Balance training;Patient/family education    PT Goals (Current goals can be found in the Care Plan section)  Acute Rehab PT Goals Patient Stated Goal: to go home PT Goal Formulation: With patient Time For Goal Achievement: 11/23/18 Potential to Achieve Goals: Good    Frequency Min 3X/week   Barriers to discharge        Co-evaluation               AM-PAC PT "6 Clicks" Mobility  Outcome Measure Help needed turning from your back to your side while in a flat bed without using bedrails?: None Help needed moving from lying on your back to sitting on the side of a flat bed without using bedrails?: A Little Help needed moving to and from a bed to a chair (including a wheelchair)?: A Little Help needed standing up from a chair using your arms (e.g., wheelchair or bedside chair)?: A Little Help needed to walk in hospital room?: A Little Help needed climbing 3-5 steps with a railing? : A Little 6 Click Score: 19    End of Session Equipment Utilized During Treatment: Gait belt Activity Tolerance: Patient tolerated treatment well Patient left: in bed;with call bell/phone within reach;with bed alarm set Nurse Communication: Mobility status PT Visit Diagnosis: Unsteadiness on feet (R26.81);Muscle weakness (generalized) (M62.81)    Time: 5427-0623 PT Time Calculation (min) (ACUTE ONLY): 25 min   Charges:   PT Evaluation $PT Eval Low Complexity: 1 Low PT Treatments $Gait Training: 8-22 mins        Leighton Ruff, PT, DPT  Acute Rehabilitation Services  Pager: [phone] Office: [phone]   Rudean Hitt 11/09/2018, 12:52 PM

## 2018-11-09 NOTE — Progress Notes (Signed)
PROGRESS NOTE    EGYPT WELCOME  DTO:[id number] DOB: May 27, 1940 DOA: 11/06/2018 PCP: Wenda Low, MD      Brief Narrative:  Mrs. Pacini is a 78 y.o. F with hx DM, DM, asthma/COPD, RA on leflunomide, diverticulitis, and depression who presented with several days progressive SOB and cough.  In the ER, WBC 12K, CXR showed right pleural effusion and RLL opacity.  SpO2 89% on room air.  On antibiotics, steroids, admitted for COPD flare with pleural effusion.      Assessment & Plan:  Acute on chronic hypoxic respiratory failure due to COPD exacerbation, pneumonia, pleural effusion Afebrile.  Still on supplemental O2.  RR 18. -Continue cefepime, day 4 -Continue Solu-Medrol -Continue fluticasone-Vilanterol -Continue scheduled duo nebs   Pleural effusion Repeat CXR personally reviewed, shows large right effusion still, no change.  Radiology recommend CT chest with contrast -Obtain CT chest with contrast -Hold heparin for likely thoracentesis tomorrow  Rheumatoid arthritis She has some left wrist swelling -Continue leflunomide -Continue Solu-Medrol, also for COPD flare  Depression -Continue duloxetine, mirtazapine  Diabetes Glucoses elevated -Continue Levemir, increased dose -Continue sliding scale corrections -Add mealtime insulin -Continue baby aspirin, pravastatin -Hold home metformin  Hypertension BP elevated -Continue amlodipine, losartan -Hydralazine PRN for sever range pressures   Leukocytosis From steroids I suspect  Hypomagnesemia -Continue magnesium oxide  GERD -Continue pantoprazole  Insomnia -Continue melatonin -Start acetaminophen 1 g at night    MDM and disposition: The below labs and imaging reports were reviewed and summarized above.  Medication management as above.  The patient was admitted with Pneumonia with suspected parapneumonic effusion.  She remains on supplemental oxygen, and dyspneic with minimal exertion.  The effusions  remains moderate to large in size, will obtain CT chest and probably thoracentesis after.      DVT prophylaxis: SCDs Code Status: Full code Family Communication: Son at the bedside    Consultants:    Procedures:   10/18 chest x-ray --right pleural effusion  10/21 chest x-ray --persistent right pleural effusion  10/21 CT chest pending  Antimicrobials:   Vancomycin 10/18 and 10/19  Cefepime 10/18>>  Culture data:   10/18 blood culture x2 NG       Subjective: Insomnia persists, no fever.  No respiratory distress, no chest pain.  She is producing some phlegm, but this is not bad.  No confusion.  No vomiting, diarrhea.  Objective: Vitals:   11/09/18 0653 11/09/18 0845 11/09/18 0923 11/09/18 1634  BP: (!) 160/85 (!) 153/75  (!) 155/89  Pulse:  79  92  Resp:  18  18  Temp: 98 F (36.7 C) 98.1 F (36.7 C)  97.9 F (36.6 C)  TempSrc:  Oral  Oral  SpO2:  95% 98% 95%  Weight:      Height:        Intake/Output Summary (Last 24 hours) at 11/09/2018 1708 Last data filed at 11/09/2018 1300 Gross per 24 hour  Intake 940.46 ml  Output 2200 ml  Net -1259.54 ml   Filed Weights   11/06/18 1900 11/07/18 1546  Weight: 74.9 kg 67.9 kg    Examination: General appearance:  adult female, alert and in no acute distress.  Lying in bed, supplemental oxygen in place. HEENT: Anicteric, conjunctiva pink, lids and lashes normal. No nasal deformity, discharge, epistaxis.  Lips moist, dentition poor, oropharynx moist, no oral lesions, hearing normal.   Skin: Warm and dry.  No jaundice.  No suspicious rashes or lesions. Cardiac: RRR, nl S1-S2, no murmurs  appreciated.  Capillary refill is brisk.  JVP normal  No LE edema.  Radial pulses 2+ and symmetric. Respiratory: Normal respiratory rate and rhythm.  CTAB without rales or wheezes.,  Diminished at the right base, crackles bilaterally.  Wheezing. Abdomen: Abdomen soft.  No TTP or guarding. No ascites, distension,  hepatosplenomegaly.   MSK: No deformities or effusions. Neuro: Awake and alert.  EOMI, moves all extremities. Speech fluent.    Psych: Sensorium intact and responding to questions, attention normal. Affect normal.  Judgment and insight appear normal.    Data Reviewed: I have personally reviewed following labs and imaging studies:  CBC: Recent Labs  Lab 11/06/18 1653 11/07/18 1018 11/08/18 0607 11/09/18 0501  WBC 12.0* 5.4 10.8* 15.7*  NEUTROABS 9.2* 4.7 9.7* 12.8*  HGB 13.3 13.2 12.5 12.9  HCT 41.3 39.2 38.3 39.2  MCV 86.0 85.0 85.1 84.7  PLT 370 364 412* 767*   Basic Metabolic Panel: Recent Labs  Lab 11/06/18 1653 11/07/18 0500 11/07/18 1018 11/08/18 0607 11/09/18 0501  NA 138  --  137 139 141  K 3.1*  --  3.8 3.7 3.4*  CL 101  --  103 103 106  CO2 20*  --  21* 23 24  GLUCOSE 197*  --  308* 271* 155*  BUN 14  --  16 18 19   CREATININE 0.83  --  0.67 0.71 0.68  CALCIUM 9.0  --  8.7* 8.6* 8.5*  MG  --  1.2*  --  1.6* 1.7  PHOS  --   --  2.1* 3.3 1.9*   GFR: Estimated Creatinine Clearance: 56 mL/min (by C-G formula based on SCr of 0.68 mg/dL). Liver Function Tests: Recent Labs  Lab 11/07/18 1018 11/08/18 0607 11/09/18 0501  ALBUMIN 3.2* 3.2* 3.0*   No results for input(s): LIPASE, AMYLASE in the last 168 hours. No results for input(s): AMMONIA in the last 168 hours. Coagulation Profile: No results for input(s): INR, PROTIME in the last 168 hours. Cardiac Enzymes: No results for input(s): CKTOTAL, CKMB, CKMBINDEX, TROPONINI in the last 168 hours. BNP (last 3 results) No results for input(s): PROBNP in the last 8760 hours. HbA1C: No results for input(s): HGBA1C in the last 72 hours. CBG: Recent Labs  Lab 11/08/18 1639 11/08/18 2049 11/09/18 0655 11/09/18 1125 11/09/18 1630  GLUCAP 217* 215* 136* 190* 238*   Lipid Profile: No results for input(s): CHOL, HDL, LDLCALC, TRIG, CHOLHDL, LDLDIRECT in the last 72 hours. Thyroid Function Tests: No results  for input(s): TSH, T4TOTAL, FREET4, T3FREE, THYROIDAB in the last 72 hours. Anemia Panel: No results for input(s): VITAMINB12, FOLATE, FERRITIN, TIBC, IRON, RETICCTPCT in the last 72 hours. Urine analysis:    Component Value Date/Time   COLORURINE YELLOW 11/07/2018 0001   APPEARANCEUR HAZY (A) 11/07/2018 0001   LABSPEC 1.019 11/07/2018 0001   PHURINE 6.0 11/07/2018 0001   GLUCOSEU 50 (A) 11/07/2018 0001   GLUCOSEU NEGATIVE 08/31/2012 0836   HGBUR NEGATIVE 11/07/2018 0001   BILIRUBINUR NEGATIVE 11/07/2018 0001   KETONESUR NEGATIVE 11/07/2018 0001   PROTEINUR >=300 (A) 11/07/2018 0001   UROBILINOGEN 0.2 05/31/2014 1402   NITRITE NEGATIVE 11/07/2018 0001   LEUKOCYTESUR NEGATIVE 11/07/2018 0001   Sepsis Labs: @LABRCNTIP (procalcitonin:4,lacticacidven:4)  ) Recent Results (from the past 240 hour(s))  Respiratory Panel by PCR     Status: None   Collection Time: 11/06/18 10:42 PM   Specimen: Nasopharyngeal Swab; Respiratory  Result Value Ref Range Status   Adenovirus NOT DETECTED NOT DETECTED Final   Coronavirus  229E NOT DETECTED NOT DETECTED Final    Comment: (NOTE) The Coronavirus on the Respiratory Panel, DOES NOT test for the novel  Coronavirus (2019 nCoV)    Coronavirus HKU1 NOT DETECTED NOT DETECTED Final   Coronavirus NL63 NOT DETECTED NOT DETECTED Final   Coronavirus OC43 NOT DETECTED NOT DETECTED Final   Metapneumovirus NOT DETECTED NOT DETECTED Final   Rhinovirus / Enterovirus NOT DETECTED NOT DETECTED Final   Influenza A NOT DETECTED NOT DETECTED Final   Influenza B NOT DETECTED NOT DETECTED Final   Parainfluenza Virus 1 NOT DETECTED NOT DETECTED Final   Parainfluenza Virus 2 NOT DETECTED NOT DETECTED Final   Parainfluenza Virus 3 NOT DETECTED NOT DETECTED Final   Parainfluenza Virus 4 NOT DETECTED NOT DETECTED Final   Respiratory Syncytial Virus NOT DETECTED NOT DETECTED Final   Bordetella pertussis NOT DETECTED NOT DETECTED Final   Chlamydophila pneumoniae NOT  DETECTED NOT DETECTED Final   Mycoplasma pneumoniae NOT DETECTED NOT DETECTED Final    Comment: Performed at [facility], [place] [address], [place], [place] [address]  SARS Coronavirus 2 by RT PCR (hospital order, performed in [facility] hospital lab) Nasopharyngeal Nasopharyngeal Swab     Status: None   Collection Time: 11/06/18 11:05 PM   Specimen: Nasopharyngeal Swab  Result Value Ref Range Status   SARS Coronavirus 2 NEGATIVE NEGATIVE Final    Comment: (NOTE) If result is NEGATIVE SARS-CoV-2 target nucleic acids are NOT DETECTED. The SARS-CoV-2 RNA is generally detectable in upper and lower  respiratory specimens during the acute phase of infection. The lowest  concentration of SARS-CoV-2 viral copies this assay can detect is 250  copies / mL. A negative result does not preclude SARS-CoV-2 infection  and should not be used as the sole basis for treatment or other  patient management decisions.  A negative result may occur with  improper specimen collection / handling, submission of specimen other  than nasopharyngeal swab, presence of viral mutation(s) within the  areas targeted by this assay, and inadequate number of viral copies  (<250 copies / mL). A negative result must be combined with clinical  observations, patient history, and epidemiological information. If result is POSITIVE SARS-CoV-2 target nucleic acids are DETECTED. The SARS-CoV-2 RNA is generally detectable in upper and lower  respiratory specimens dur ing the acute phase of infection.  Positive  results are indicative of active infection with SARS-CoV-2.  Clinical  correlation with patient history and other diagnostic information is  necessary to determine patient infection status.  Positive results do  not rule out bacterial infection or co-infection with other viruses. If result is PRESUMPTIVE POSTIVE SARS-CoV-2 nucleic acids MAY BE PRESENT.   A presumptive positive result was obtained on the submitted  specimen  and confirmed on repeat testing.  While 2019 novel coronavirus  (SARS-CoV-2) nucleic acids may be present in the submitted sample  additional confirmatory testing may be necessary for epidemiological  and / or clinical management purposes  to differentiate between  SARS-CoV-2 and other Sarbecovirus currently known to infect humans.  If clinically indicated additional testing with an alternate test  methodology [phone]) is advised. The SARS-CoV-2 RNA is generally  detectable in upper and lower respiratory sp ecimens during the acute  phase of infection. The expected result is Negative. Fact Sheet for Patients:  StrictlyIdeas.no Fact Sheet for Healthcare Providers: BankingDealers.co.za This test is not yet approved or cleared by the [place] FDA and has been authorized for detection and/or diagnosis of  SARS-CoV-2 by FDA under an Emergency Use Authorization (EUA).  This EUA will remain in effect (meaning this test can be used) for the duration of the COVID-19 declaration under Section 564(b)(1) of the Act, 21 U.S.C. section 360bbb-3(b)(1), unless the authorization is terminated or revoked sooner. Performed at [facility], [place] [address], [place], [place] [address]   Culture, blood (Routine X 2) w Reflex to ID Panel     Status: None (Preliminary result)   Collection Time: 11/07/18  1:00 AM   Specimen: BLOOD RIGHT FOREARM  Result Value Ref Range Status   Specimen Description BLOOD RIGHT FOREARM  Final   Special Requests   Final    BOTTLES DRAWN AEROBIC AND ANAEROBIC Blood Culture adequate volume   Culture   Final    NO GROWTH 2 DAYS Performed at [facility], [place] [address], [place], [place] [address]    Report Status PENDING  Incomplete  Culture, blood (Routine X 2) w Reflex to ID Panel     Status: None (Preliminary result)   Collection Time: 11/07/18  1:08 AM   Specimen: BLOOD LEFT HAND  Result Value Ref  Range Status   Specimen Description BLOOD LEFT HAND  Final   Special Requests   Final    BOTTLES DRAWN AEROBIC AND ANAEROBIC Blood Culture adequate volume   Culture   Final    NO GROWTH 2 DAYS Performed at [facility], [place] [address], [place], [place] [address]    Report Status PENDING  Incomplete         Radiology Studies: Dg Chest 2 View  Result Date: 11/09/2018 CLINICAL DATA:  Evaluate pleural effusions. Shortness of breath. EXAM: CHEST - 2 VIEW COMPARISON:  11/06/2018 FINDINGS: Are normal heart size. Aortic atherosclerosis. There is a persistent right pleural effusion. Volume loss, scratch set atelectasis or consolidation within the right base is identified. Left lung is clear. IMPRESSION: 1. No change in right pleural effusion. 2. Persistent right base atelectasis or consolidation. Consider further evaluation with contrast enhanced CT of the chest. To assess for underlying predisposing abnormality Electronically Signed   By: Kerby Moors M.D.   On: 11/09/2018 13:27        Scheduled Meds: . acetaminophen  1,000 mg Oral QHS  . amLODipine  10 mg Oral BID  . aspirin EC  81 mg Oral q morning - 10a  . DULoxetine  60 mg Oral BID  . fluticasone furoate-vilanterol  1 puff Inhalation q morning - 10a  . insulin aspart  0-20 Units Subcutaneous TID WC  . insulin aspart  0-5 Units Subcutaneous QHS  . [START ON 11/10/2018] insulin aspart  2 Units Subcutaneous TID WC  . insulin detemir  12 Units Subcutaneous BID  . ipratropium-albuterol  3 mL Nebulization BID  . leflunomide  20 mg Oral q morning - 10a  . losartan  100 mg Oral q morning - 10a  . magnesium oxide  400 mg Oral BID  . Melatonin  4.5 mg Oral QHS  . methylPREDNISolone (SOLU-MEDROL) injection  60 mg Intravenous Daily  . mirtazapine  7.5 mg Oral QHS  . multivitamin with minerals  1 tablet Oral q morning - 10a  . pantoprazole  40 mg Oral q morning - 10a  . pravastatin  10 mg Oral q morning - 10a   Continuous  Infusions: . ceFEPime (MAXIPIME) IV 2 g (11/09/18 1133)     LOS: 3 days    Time spent: 25 minutes  Edwin Dada, MD [facility] 11/09/2018, 5:08 PM     Please page through AMION:  [email or web address] Password [email or web address] If 7PM-7AM, please contact night-coverage

## 2018-11-09 NOTE — Progress Notes (Signed)
Pharmacy Antibiotic Note  Leslie Gamble is a 78 y.o. female admitted on 11/06/2018 with pneumonia.  Pharmacy has been consulted for vancomycin and cefepime dosing.   Today is abx D#4 for treatment of PNA. Pt is afebrile, WBC trended up to 15.7 << 10.8 however the patient is on steroids. Renal function is stable - current dosing remains appropriate.   Plan: - Continue Cefepime 2g IV every 12 hours - Consider adding a stop date for 10/24 to complete a 7d LOT - Will continue to follow renal function, culture results, LOT, and antibiotic de-escalation plans   Height: 5\' 6"  (167.6 cm) Weight: 149 lb 12.8 oz (67.9 kg) IBW/kg (Calculated) : 59.3  Temp (24hrs), Avg:98.1 F (36.7 C), Min:98 F (36.7 C), Max:98.2 F (36.8 C)  Recent Labs  Lab 11/06/18 1653 11/07/18 1018 11/08/18 0607 11/09/18 0501  WBC 12.0* 5.4 10.8* 15.7*  CREATININE 0.83 0.67 0.71 0.68    Estimated Creatinine Clearance: 56 mL/min (by C-G formula based on SCr of 0.68 mg/dL).    Allergies  Allergen Reactions  . Avelox [Moxifloxacin Hcl In Nacl] Itching and Swelling    Facial swelling  . Cephalexin Swelling    Nose swelling  . Ciprofloxacin Swelling    Facial swelling  . Clarithromycin Swelling    Facial swelling  . Clindamycin Hcl Swelling    Facial swelling  . Codeine Swelling    Lip swelling  . Diflucan [Fluconazole] Diarrhea  . Lidocaine Swelling    Facial swelling  . Metronidazole Swelling    Facial swelling  . Nitrofurantoin Monohyd Macro Swelling    Facial swelling  . Other Itching    Reaction toMagic mouthwash   . Penicillins Swelling    Facial swelling Did it involve swelling of the face/tongue/throat, SOB, or low BP? Yes Did it involve sudden or severe rash/hives, skin peeling, or any reaction on the inside of your mouth or nose? No Did you need to seek medical attention at a hospital or doctor's office? No When did it last happen?young adult If all above answers are "NO", may proceed  with cephalosporin use.  . Shrimp [Shellfish Allergy] Swelling    In nose and lips  . Sitagliptin Swelling    Facial swelling  . Sulfa Antibiotics Swelling    Facial swelling  . Tetracyclines & Related Swelling    Facial swelling  . Tramadol Hcl Swelling     facial swelling, dyspnea    Antimicrobials this admission: Vanc 10/18>> 10/20 Cefepime 10/18>>  Microbiology results: 10/18 Covid-19: neg 10/19 blood cx: ngtd 10/18 resp panel pcr: neg  Thank you for allowing pharmacy to be a part of this patient's care.  Alycia Rossetti, PharmD, BCPS Clinical Pharmacist Clinical phone for 11/09/2018: [phone] 11/09/2018 10:09 AM   **Pharmacist phone directory can now be found on [email or web address] ([email or web address]).  Listed under [place].

## 2018-11-10 ENCOUNTER — Encounter (HOSPITAL_COMMUNITY): Payer: Self-pay | Admitting: Student

## 2018-11-10 ENCOUNTER — Inpatient Hospital Stay (HOSPITAL_COMMUNITY): Payer: Medicare Other

## 2018-11-10 HISTORY — PX: IR THORACENTESIS ASP PLEURAL SPACE W/IMG GUIDE: IMG5380

## 2018-11-10 LAB — CBC
HCT: 39.6 % (ref 36.0–46.0)
Hemoglobin: 13.1 g/dL (ref 12.0–15.0)
MCH: 27.6 pg (ref 26.0–34.0)
MCHC: 33.1 g/dL (ref 30.0–36.0)
MCV: 83.5 fL (ref 80.0–100.0)
Platelets: 436 10*3/uL — ABNORMAL HIGH (ref 150–400)
RBC: 4.74 MIL/uL (ref 3.87–5.11)
RDW: 14.3 % (ref 11.5–15.5)
WBC: 11.9 10*3/uL — ABNORMAL HIGH (ref 4.0–10.5)
nRBC: 0 % (ref 0.0–0.2)

## 2018-11-10 LAB — COMPREHENSIVE METABOLIC PANEL
ALT: 18 U/L (ref 0–44)
AST: 19 U/L (ref 15–41)
Albumin: 3.2 g/dL — ABNORMAL LOW (ref 3.5–5.0)
Alkaline Phosphatase: 71 U/L (ref 38–126)
Anion gap: 12 (ref 5–15)
BUN: 19 mg/dL (ref 8–23)
CO2: 23 mmol/L (ref 22–32)
Calcium: 8.3 mg/dL — ABNORMAL LOW (ref 8.9–10.3)
Chloride: 103 mmol/L (ref 98–111)
Creatinine, Ser: 0.71 mg/dL (ref 0.44–1.00)
GFR calc Af Amer: >60 mL/min (ref >60)
GFR calc non Af Amer: >60 mL/min (ref >60)
Glucose, Bld: 268 mg/dL — ABNORMAL HIGH (ref 70–99)
Potassium: 3.2 mmol/L — ABNORMAL LOW (ref 3.5–5.1)
Sodium: 138 mmol/L (ref 135–145)
Total Bilirubin: 0.2 mg/dL — ABNORMAL LOW (ref 0.3–1.2)
Total Protein: 6.6 g/dL (ref 6.5–8.1)

## 2018-11-10 LAB — BODY FLUID CELL COUNT WITH DIFFERENTIAL
Eos, Fluid: 0 %
Lymphs, Fluid: 5 %
Monocyte-Macrophage-Serous Fluid: 95 % — ABNORMAL HIGH (ref 50–90)
Neutrophil Count, Fluid: 0 % (ref 0–25)
Total Nucleated Cell Count, Fluid: 2590 cu mm — ABNORMAL HIGH (ref 0–1000)

## 2018-11-10 LAB — PROTEIN, PLEURAL OR PERITONEAL FLUID: Total protein, fluid: 4.9 g/dL

## 2018-11-10 LAB — GRAM STAIN

## 2018-11-10 LAB — GLUCOSE, CAPILLARY
Glucose-Capillary: 200 mg/dL — ABNORMAL HIGH (ref 70–99)
Glucose-Capillary: 245 mg/dL — ABNORMAL HIGH (ref 70–99)
Glucose-Capillary: 180 mg/dL — ABNORMAL HIGH (ref 70–99)
Glucose-Capillary: 209 mg/dL — ABNORMAL HIGH (ref 70–99)

## 2018-11-10 LAB — LACTATE DEHYDROGENASE, PLEURAL OR PERITONEAL FLUID: LD, Fluid: 286 U/L — ABNORMAL HIGH (ref 3–23)

## 2018-11-10 LAB — LACTATE DEHYDROGENASE: LDH: 170 U/L (ref 98–192)

## 2018-11-10 MED ORDER — ALPRAZOLAM 0.25 MG PO TABS
0.2500 mg | ORAL_TABLET | Freq: Three times a day (TID) | ORAL | Status: DC | PRN
  Administered 2018-11-10 – 2018-11-11 (×2): 0.25 mg via ORAL
  Filled 2018-11-10 (×2): qty 1

## 2018-11-10 MED ORDER — IOHEXOL 300 MG/ML  SOLN
75.0000 mL | Freq: Once | INTRAMUSCULAR | Status: AC | PRN
  Administered 2018-11-10: 75 mL via INTRAVENOUS

## 2018-11-10 MED ORDER — TETRACAINE HCL 1 % IJ SOLN
INTRAMUSCULAR | Status: DC | PRN
  Administered 2018-11-10: 8 mg

## 2018-11-10 MED ORDER — TETRACAINE HCL 1 % IJ SOLN
100.0000 mg | Freq: Once | INTRAMUSCULAR | Status: AC
  Filled 2018-11-10: qty 10

## 2018-11-10 MED ORDER — POTASSIUM CHLORIDE CRYS ER 20 MEQ PO TBCR
40.0000 meq | EXTENDED_RELEASE_TABLET | Freq: Once | ORAL | Status: AC
  Administered 2018-11-10: 40 meq via ORAL
  Filled 2018-11-10: qty 2

## 2018-11-10 NOTE — TOC Initial Note (Signed)
Transition of Care [facility]) - Initial/Assessment Note    Patient Details  Name: Leslie Gamble MRN: [id number] Date of Birth: 1940/06/14  Transition of Care [facility]) CM/SW Contact:    Bartholomew Crews, RN Phone Number: [phone] 11/10/2018, 1:02 PM  Clinical Narrative:                 Spoke with patient and son at bedside. PTA home with son. Active with [facility] for [facility] PT and OT. Will need HH orders for PT and OT for resumption of services. Has all needed DME at home - walker, 3N1. Son picks up her medications from the pharmacy. No concerns about transition home at this time. Following for TOC needs.   Expected Discharge Plan: [place] Barriers to Discharge: Continued Medical Work up   Patient Goals and CMS Choice Patient states their goals for this hospitalization and ongoing recovery are:: return home CMS Medicare.gov Compare Post Acute Care list provided to:: Patient Choice offered to / list presented to : Patient  Expected Discharge Plan and Services Expected Discharge Plan: [place]   Discharge Planning Services: CM Consult Post Acute Care Choice: [place] arrangements for the past 2 months: Single Family Home                 DME Arranged: N/A DME Agency: NA       HH Arranged: PT, OT HH Agency: [place] Date [facility] Agency Contacted: 11/10/18 Time HH Agency Contacted: 1300 Representative spoke with at [place]: Tommi Rumps  Prior Living Arrangements/Services Living arrangements for the past 2 months: [place] with:: Self, Adult Children Patient language and need for interpreter reviewed:: Yes Do you feel safe going back to the place where you live?: Yes      Need for Family Participation in Patient Care: Yes (Comment) Care giver support system in place?: Yes (comment) Current home services: Home PT, Home OT Criminal Activity/Legal Involvement Pertinent to Current Situation/Hospitalization: No - Comment as  needed  Activities of Daily Living      Permission Sought/Granted Permission sought to share information with : Family Supports Permission granted to share information with : Yes, Verbal Permission Granted        Permission granted to share info w Relationship: son     Emotional Assessment Appearance:: Appears stated age Attitude/Demeanor/Rapport: Engaged Affect (typically observed): Accepting Orientation: : Oriented to Situation, Oriented to  Time, Oriented to Place, Oriented to Self Alcohol / Substance Use: Alcohol Use Psych Involvement: No (comment)  Admission diagnosis:  HCAP (healthcare-associated pneumonia) [J18.9] Moderate asthma with exacerbation, unspecified whether persistent [J45.901] Patient Active Problem List   Diagnosis Date Noted  . Diabetes mellitus without complication ([place]) 53/97/6734  . HLD (hyperlipidemia) 11/06/2018  . GERD (gastroesophageal reflux disease) 11/06/2018  . COPD with acute exacerbation ([place]) 11/06/2018  . HCAP (healthcare-associated pneumonia) 11/06/2018  . Acute on chronic respiratory failure with hypoxia ([place]) 11/06/2018  . RA (rheumatoid arthritis) ([place]) 11/06/2018  . Pleural effusion on right 11/06/2018  . Generalized weakness 08/29/2018  . Hypokalemia 08/29/2018  . Pain and swelling of left knee 08/29/2018  . Generalized anxiety disorder 08/29/2018  . Thrombocytosis ([place]) 02/22/2017  . AKI (acute kidney injury) ([place]) 12/26/2016  . Acute kidney injury ([place]) 12/25/2016  . Diabetes mellitus type 2, uncontrolled ([place]) 12/25/2016  . Asthma 12/25/2016  . Severe depression ([place]) 12/25/2016  . Junctional escape rhythm 12/25/2016  . RUQ abdominal pain 12/25/2016  .  Chronic pain 12/25/2016  . Hypomagnesemia 12/25/2016  . Acute blood loss anemia 05/08/2015  . Hematochezia 05/07/2015  . Bleeding gastrointestinal   . Diabetes mellitus with complication ([place])   . Essential hypertension   . Fibroid uterus 05/17/2013  . Obesity 12/30/2011   . Smoker 12/30/2011  . Diabetes mellitus, type 2 ([place])   . Depression   . COPD GOLD II   . Arthritis   . Diverticulitis    PCP:  Wenda Low, MD Pharmacy:   [id number] - [place], [place] [address]  [place] [address] Phone: [phone] Fax: [phone]     Social Determinants of Health (SDOH) Interventions    Readmission Risk Interventions No flowsheet data found.

## 2018-11-10 NOTE — Procedures (Signed)
PROCEDURE SUMMARY:  Successful US guided right thoracentesis. Yielded 1.3 liters of amber fluid. Pt tolerated procedure well. No immediate complications.  Specimen was sent for labs. CXR ordered.  EBL < 5 mL  Docia Barrier PA-C 11/10/2018 4:09 PM

## 2018-11-10 NOTE — Progress Notes (Signed)
PROGRESS NOTE    Leslie Gamble  EHM:[id number] DOB: 02-May-1940 DOA: 11/06/2018 PCP: Wenda Low, MD      Brief Narrative:  Leslie Gamble is a 78 y.o. F with hx DM, DM, asthma/COPD, RA on leflunomide, diverticulitis, and depression who presented with several days progressive SOB and cough.  In the ER, WBC 12K, CXR showed right pleural effusion and RLL opacity.  SpO2 89% on room air.  On antibiotics, steroids, admitted for COPD flare with pleural effusion.      Assessment & Plan:  Acute on chronic hypoxic respiratory failure due to COPD exacerbation, pneumonia, pleural effusion Afebrile.  CXR and CT yesterday showed moderate sized right effusion persistent  -Continue cefepime, day 5 -Continue Solu-Medrol -Continue fluticasone-Vilanterol -Continue scheduled duo nebs   Pleural effusion Repeat CXR personally reviewed, shows large right effusion still, no change.  Radiology recommend CT chest with contrast.  CT showed moderate simple effusion, ?"pleural enhancement" -Thoracentesis with cytology -Check LDH, cytologies -Repeat CT chest in 4 weeks  Rheumatoid arthritis She has some left wrist swelling, stable -Continue leflunomide -Continue Solu-Medrol, also for COPD flare  Depression -Continue duloxetine, mirtazapine  Diabetes Glucoses remain elevated -Continue Levemir  -Continue sliding scale corrections -Continue mealtime insulin -Continue baby aspirin, pravastatin -Hold home metformin  Hypertension BP elevated -Continue amlodipine, losartan -Hydralazine PRN for sever range pressures   Leukocytosis From steroids I suspect  Hypomagnesemia Hypokalemia -Continue magnesium oxide -Supplement K  GERD -Continue pantoprazole  Insomnia -Continue melatonin -Continue acetaminophen 1 g at night -Restart mirtazapine   MDM and disposition: The below labs and imaging reports reviewed and summarized above.  Medication management as above.   The patient was  admitted with Pneumonia with suspected parapneumonic effusion.  She remains dyspneic with exertion, with large effusion that has not improved.  This will need thoracentesis and weaning of oxygen.    If thoracentesis today and able to waen to room air, home tomorrow     DVT prophylaxis: SCDs Code Status: Full code Family Communication: Son at the bedside    Consultants:    Procedures:   10/18 chest x-ray --right pleural effusion  10/21 chest x-ray --persistent right pleural effusion  10/21 CT chest -- free flowing effusion  10/22 thoracentesis  Antimicrobials:   Vancomycin 10/18 and 10/19  Cefepime 10/18>>  Culture data:   10/18 blood culture x2 NG       Subjective: No new fever.  She is anxious and has dyspnea with exertion, shortness of breath.  No change in chest pain, no change in phlegm, no confusion, vomiting, diarrhea.         Objective: Vitals:   11/10/18 0413 11/10/18 0749 11/10/18 0800 11/10/18 1400  BP: (!) 178/90  (!) 148/92 (!) 154/82  Pulse: 92  87 85  Resp: 16     Temp: 98.1 F (36.7 C)  98.1 F (36.7 C) 98.3 F (36.8 C)  TempSrc: Oral  Oral Oral  SpO2: 93% 91% 99% 91%  Weight: 74.3 kg     Height:        Intake/Output Summary (Last 24 hours) at 11/10/2018 1612 Last data filed at 11/10/2018 0601 Gross per 24 hour  Intake 500 ml  Output 650 ml  Net -150 ml   Filed Weights   11/06/18 1900 11/07/18 1546 11/10/18 0413  Weight: 74.9 kg 67.9 kg 74.3 kg    Examination: General appearance: Elderly adult female, lying in bed, no acute distress HEENT: Anicteric, conjunctival pink, lids and lashes normal.  No  nasal deformity, discharge, or epistaxis.  Lips moist, dentition in decent repair, oropharynx moist, no oral lesions, hearing normal. Skin: Warm and dry.  No jaundice.  No suspicious rashes or lesions. Cardiac: RRR, no murmurs, JVP normal, no lower extremity edema Respiratory: Respirations shallow, appears uncomfortable with  exertion.  Diminished at the right base considerably Abdomen: Abdomen soft, no tenderness palpation or guarding.  No ascites or distention. MSK: No deformities or effusions of the large joints of the upper or lower extremities bilaterally. Neuro: Awake and alert, extraocular movements intact, moves all extremities with normal strength and coordination, speech fluent.    Psych: Sensorium intact responding to questions, attention normal, affect normal, judgment insight appear normal.    Data Reviewed: I have personally reviewed following labs and imaging studies:  CBC: Recent Labs  Lab 11/06/18 1653 11/07/18 1018 11/08/18 0607 11/09/18 0501 11/10/18 0404  WBC 12.0* 5.4 10.8* 15.7* 11.9*  NEUTROABS 9.2* 4.7 9.7* 12.8*  --   HGB 13.3 13.2 12.5 12.9 13.1  HCT 41.3 39.2 38.3 39.2 39.6  MCV 86.0 85.0 85.1 84.7 83.5  PLT 370 364 412* 412* 097*   Basic Metabolic Panel: Recent Labs  Lab 11/06/18 1653 11/07/18 0500 11/07/18 1018 11/08/18 0607 11/09/18 0501 11/10/18 0404  NA 138  --  137 139 141 138  K 3.1*  --  3.8 3.7 3.4* 3.2*  CL 101  --  103 103 106 103  CO2 20*  --  21* 23 24 23   GLUCOSE 197*  --  308* 271* 155* 268*  BUN 14  --  16 18 19 19   CREATININE 0.83  --  0.67 0.71 0.68 0.71  CALCIUM 9.0  --  8.7* 8.6* 8.5* 8.3*  MG  --  1.2*  --  1.6* 1.7  --   PHOS  --   --  2.1* 3.3 1.9*  --    GFR: Estimated Creatinine Clearance: 61.7 mL/min (by C-G formula based on SCr of 0.71 mg/dL). Liver Function Tests: Recent Labs  Lab 11/07/18 1018 11/08/18 0607 11/09/18 0501 11/10/18 0404  AST  --   --   --  19  ALT  --   --   --  18  ALKPHOS  --   --   --  71  BILITOT  --   --   --  0.2*  PROT  --   --   --  6.6  ALBUMIN 3.2* 3.2* 3.0* 3.2*   No results for input(s): LIPASE, AMYLASE in the last 168 hours. No results for input(s): AMMONIA in the last 168 hours. Coagulation Profile: No results for input(s): INR, PROTIME in the last 168 hours. Cardiac Enzymes: No results  for input(s): CKTOTAL, CKMB, CKMBINDEX, TROPONINI in the last 168 hours. BNP (last 3 results) No results for input(s): PROBNP in the last 8760 hours. HbA1C: No results for input(s): HGBA1C in the last 72 hours. CBG: Recent Labs  Lab 11/09/18 1125 11/09/18 1630 11/09/18 2152 11/10/18 0655 11/10/18 1145  GLUCAP 190* 238* 282* 200* 245*   Lipid Profile: No results for input(s): CHOL, HDL, LDLCALC, TRIG, CHOLHDL, LDLDIRECT in the last 72 hours. Thyroid Function Tests: No results for input(s): TSH, T4TOTAL, FREET4, T3FREE, THYROIDAB in the last 72 hours. Anemia Panel: No results for input(s): VITAMINB12, FOLATE, FERRITIN, TIBC, IRON, RETICCTPCT in the last 72 hours. Urine analysis:    Component Value Date/Time   COLORURINE YELLOW 11/07/2018 0001   APPEARANCEUR HAZY (A) 11/07/2018 0001   LABSPEC 1.019 11/07/2018 0001  PHURINE 6.0 11/07/2018 0001   GLUCOSEU 50 (A) 11/07/2018 0001   GLUCOSEU NEGATIVE 08/31/2012 0836   HGBUR NEGATIVE 11/07/2018 0001   BILIRUBINUR NEGATIVE 11/07/2018 0001   KETONESUR NEGATIVE 11/07/2018 0001   PROTEINUR >=300 (A) 11/07/2018 0001   UROBILINOGEN 0.2 05/31/2014 1402   NITRITE NEGATIVE 11/07/2018 0001   LEUKOCYTESUR NEGATIVE 11/07/2018 0001   Sepsis Labs: @LABRCNTIP (procalcitonin:4,lacticacidven:4)  ) Recent Results (from the past 240 hour(s))  Respiratory Panel by PCR     Status: None   Collection Time: 11/06/18 10:42 PM   Specimen: Nasopharyngeal Swab; Respiratory  Result Value Ref Range Status   Adenovirus NOT DETECTED NOT DETECTED Final   Coronavirus 229E NOT DETECTED NOT DETECTED Final    Comment: (NOTE) The Coronavirus on the Respiratory Panel, DOES NOT test for the novel  Coronavirus (2019 nCoV)    Coronavirus HKU1 NOT DETECTED NOT DETECTED Final   Coronavirus NL63 NOT DETECTED NOT DETECTED Final   Coronavirus OC43 NOT DETECTED NOT DETECTED Final   Metapneumovirus NOT DETECTED NOT DETECTED Final   Rhinovirus / Enterovirus NOT  DETECTED NOT DETECTED Final   Influenza A NOT DETECTED NOT DETECTED Final   Influenza B NOT DETECTED NOT DETECTED Final   Parainfluenza Virus 1 NOT DETECTED NOT DETECTED Final   Parainfluenza Virus 2 NOT DETECTED NOT DETECTED Final   Parainfluenza Virus 3 NOT DETECTED NOT DETECTED Final   Parainfluenza Virus 4 NOT DETECTED NOT DETECTED Final   Respiratory Syncytial Virus NOT DETECTED NOT DETECTED Final   Bordetella pertussis NOT DETECTED NOT DETECTED Final   Chlamydophila pneumoniae NOT DETECTED NOT DETECTED Final   Mycoplasma pneumoniae NOT DETECTED NOT DETECTED Final    Comment: Performed at [facility], [place] [address] [place], [place] [address]  SARS Coronavirus 2 by RT PCR (hospital order, performed in [facility] hospital lab) Nasopharyngeal Nasopharyngeal Swab     Status: None   Collection Time: 11/06/18 11:05 PM   Specimen: Nasopharyngeal Swab  Result Value Ref Range Status   SARS Coronavirus 2 NEGATIVE NEGATIVE Final    Comment: (NOTE) If result is NEGATIVE SARS-CoV-2 target nucleic acids are NOT DETECTED. The SARS-CoV-2 RNA is generally detectable in upper and lower  respiratory specimens during the acute phase of infection. The lowest  concentration of SARS-CoV-2 viral copies this assay can detect is 250  copies / mL. A negative result does not preclude SARS-CoV-2 infection  and should not be used as the sole basis for treatment or other  patient management decisions.  A negative result may occur with  improper specimen collection / handling, submission of specimen other  than nasopharyngeal swab, presence of viral mutation(s) within the  areas targeted by this assay, and inadequate number of viral copies  (<250 copies / mL). A negative result must be combined with clinical  observations, patient history, and epidemiological information. If result is POSITIVE SARS-CoV-2 target nucleic acids are DETECTED. The SARS-CoV-2 RNA is generally detectable in upper and lower    respiratory specimens dur ing the acute phase of infection.  Positive  results are indicative of active infection with SARS-CoV-2.  Clinical  correlation with patient history and other diagnostic information is  necessary to determine patient infection status.  Positive results do  not rule out bacterial infection or co-infection with other viruses. If result is PRESUMPTIVE POSTIVE SARS-CoV-2 nucleic acids MAY BE PRESENT.   A presumptive positive result was obtained on the submitted specimen  and confirmed on repeat testing.  While 2019 novel coronavirus  (  SARS-CoV-2) nucleic acids may be present in the submitted sample  additional confirmatory testing may be necessary for epidemiological  and / or clinical management purposes  to differentiate between  SARS-CoV-2 and other Sarbecovirus currently known to infect humans.  If clinically indicated additional testing with an alternate test  methodology [phone]) is advised. The SARS-CoV-2 RNA is generally  detectable in upper and lower respiratory sp ecimens during the acute  phase of infection. The expected result is Negative. Fact Sheet for Patients:  StrictlyIdeas.no Fact Sheet for Healthcare Providers: BankingDealers.co.za This test is not yet approved or cleared by the [place] FDA and has been authorized for detection and/or diagnosis of SARS-CoV-2 by FDA under an Emergency Use Authorization (EUA).  This EUA will remain in effect (meaning this test can be used) for the duration of the COVID-19 declaration under Section 564(b)(1) of the Act, 21 U.S.C. section 360bbb-3(b)(1), unless the authorization is terminated or revoked sooner. Performed at [facility], [place] [address] [place], [place] [address]   Culture, blood (Routine X 2) w Reflex to ID Panel     Status: None (Preliminary result)   Collection Time: 11/07/18  1:00 AM   Specimen: BLOOD RIGHT FOREARM  Result Value  Ref Range Status   Specimen Description BLOOD RIGHT FOREARM  Final   Special Requests   Final    BOTTLES DRAWN AEROBIC AND ANAEROBIC Blood Culture adequate volume   Culture   Final    NO GROWTH 3 DAYS Performed at [facility], [place] [address], [place], [place] [address]    Report Status PENDING  Incomplete  Culture, blood (Routine X 2) w Reflex to ID Panel     Status: None (Preliminary result)   Collection Time: 11/07/18  1:08 AM   Specimen: BLOOD LEFT HAND  Result Value Ref Range Status   Specimen Description BLOOD LEFT HAND  Final   Special Requests   Final    BOTTLES DRAWN AEROBIC AND ANAEROBIC Blood Culture adequate volume   Culture   Final    NO GROWTH 3 DAYS Performed at [facility], [place] [address], [place], [place] [address]    Report Status PENDING  Incomplete         Radiology Studies: Dg Chest 1 View  Result Date: 11/10/2018 CLINICAL DATA:  Status post thoracentesis EXAM: CHEST  1 VIEW COMPARISON:  11/09/2018 FINDINGS: Cardiac shadow is stable. Tortuous aorta is noted accentuated by the portable technique. Decrease in right-sided pleural effusion is noted following thoracentesis. No sizable pneumothorax is seen. No bony abnormality is noted. IMPRESSION: No evidence of post thoracentesis pneumothorax. Electronically Signed   By: Inez Catalina M.D.   On: 11/10/2018 16:04   Dg Chest 2 View  Result Date: 11/09/2018 CLINICAL DATA:  Evaluate pleural effusions. Shortness of breath. EXAM: CHEST - 2 VIEW COMPARISON:  11/06/2018 FINDINGS: Are normal heart size. Aortic atherosclerosis. There is a persistent right pleural effusion. Volume loss, scratch set atelectasis or consolidation within the right base is identified. Left lung is clear. IMPRESSION: 1. No change in right pleural effusion. 2. Persistent right base atelectasis or consolidation. Consider further evaluation with contrast enhanced CT of the chest. To assess for underlying predisposing abnormality  Electronically Signed   By: Kerby Moors M.D.   On: 11/09/2018 13:27   Ct Chest W Contrast  Result Date: 11/10/2018 CLINICAL DATA:  Pleural effusion. Right lower lobe opacity. EXAM: CT CHEST WITH CONTRAST TECHNIQUE: Multidetector CT imaging of the chest  was performed during intravenous contrast administration. CONTRAST:  49mL OMNIPAQUE IOHEXOL 300 MG/ML  SOLN COMPARISON:  Radiographs yesterday, as well as 11/06/2018. No prior CT. FINDINGS: Cardiovascular: Aortic atherosclerosis. No aneurysm. Descending thoracic aorta is tortuous. Normal heart size. There are coronary artery calcifications. No pericardial effusion. Physiologic fluid in the superior pericardial recess. Mediastinum/Nodes: No enlarged mediastinal or hilar lymph nodes. No esophageal wall thickening. No visualized thyroid nodule. Lungs/Pleura: Moderate-sized free-flowing right pleural effusion. Pleural fluid measures simple fluid density. There is associated compressive atelectasis, including complete atelectasis of the right lower lobe. This limits parenchymal assessment. Questionable pleural enhancement, series 3, image 104. Mild emphysema. No evidence of pneumonia. Linear subsegmental atelectasis in the lingula and dependent left lower lobe. No left pleural effusion. No pulmonary edema. No evidence of pulmonary mass. Minimal dependent mucus in the trachea. Upper Abdomen: Upper abdominal atherosclerosis. Mild fatty atrophy of the pancreas. No acute findings. Musculoskeletal: There are no acute or suspicious osseous abnormalities. IMPRESSION: 1. Moderate-sized free-flowing right pleural effusion with adjacent compressive atelectasis, including complete atelectasis of the right lower lobe. Questionable area of pleural enhancement in the right lower lobe which may be inflammatory, however neoplastic enhancement is not excluded. There are no other findings to suggest neoplasm in thorax. Consider fluid sampling. 2. Mild emphysema. 3. Aortic  atherosclerosis.  Coronary artery calcifications. Aortic Atherosclerosis (ICD10-I70.0) and Emphysema (ICD10-J43.9). Electronically Signed   By: Keith Rake M.D.   On: 11/10/2018 03:13        Scheduled Meds:  acetaminophen  1,000 mg Oral QHS   amLODipine  10 mg Oral BID   aspirin EC  81 mg Oral q morning - 10a   DULoxetine  60 mg Oral BID   fluticasone furoate-vilanterol  1 puff Inhalation q morning - 10a   insulin aspart  0-20 Units Subcutaneous TID WC   insulin aspart  0-5 Units Subcutaneous QHS   insulin aspart  2 Units Subcutaneous TID WC   insulin detemir  12 Units Subcutaneous BID   leflunomide  20 mg Oral q morning - 10a   losartan  100 mg Oral q morning - 10a   magnesium oxide  400 mg Oral BID   Melatonin  4.5 mg Oral QHS   methylPREDNISolone (SOLU-MEDROL) injection  60 mg Intravenous Daily   mirtazapine  7.5 mg Oral QHS   multivitamin with minerals  1 tablet Oral q morning - 10a   pantoprazole  40 mg Oral q morning - 10a   pravastatin  10 mg Oral q morning - 10a   tetracaine  100 mg Intraspinal Once   Continuous Infusions:  ceFEPime (MAXIPIME) IV 2 g (11/10/18 1025)     LOS: 4 days    Time spent: 25 minutes    Edwin Dada, MD [facility] 11/10/2018, 4:12 PM     Please page through [place]:  [email or web address] Password [email or web address] If 7PM-7AM, please contact night-coverage

## 2018-11-10 NOTE — Care Management Important Message (Signed)
Important Message  Patient Details  Name: LEOLIA VINZANT MRN: [id number] Date of Birth: 1940-07-18   Medicare Important Message Given:  No  Patient refused to sign.   Cassey Bacigalupo 11/10/2018, 8:54 AM

## 2018-11-10 NOTE — Progress Notes (Signed)
Physical Therapy Treatment Patient Details Name: Leslie Gamble MRN: [id number] DOB: 1940/08/09 Today's Date: 11/10/2018    History of Present Illness Pt is a 78 y/o female admitted secondary to acute on chronic respiratory failure. Found to have R pleural effusion. PMH includes HTN, COPD, RA, DM, and depression.     PT Comments    Pt limited this session secondary to reports of lightheadedness and not feeling well. Mobility limited to bathroom and back to bed. Oxygen sats initially reading at 88% on RA, however, increased to 92% on RA upon return to supine. BP checked and at 171/90 mmHg; RN notified. Feel pt will progress well once feeling better. Will continue to follow acutely to maximize functional mobility independence and safety.    Follow Up Recommendations  Home health PT     Equipment Recommendations  None recommended by PT    Recommendations for Other Services       Precautions / Restrictions Precautions Precautions: Fall;Other (comment) Precaution Comments: watch oxygen sats  Restrictions Weight Bearing Restrictions: No    Mobility  Bed Mobility Overal bed mobility: Needs Assistance Bed Mobility: Supine to Sit;Sit to Supine     Supine to sit: Supervision Sit to supine: Supervision   General bed mobility comments: Supervision for safety and line management.   Transfers Overall transfer level: Needs assistance Equipment used: Rolling walker (2 wheeled) Transfers: Sit to/from Stand Sit to Stand: Min guard         General transfer comment: Min guard for safety. Cues for safe hand placement.   Ambulation/Gait Ambulation/Gait assistance: Min guard Gait Distance (Feet): 15 Feet Assistive device: Rolling walker (2 wheeled) Gait Pattern/deviations: Step-through pattern;Decreased stride length Gait velocity: Decreased   General Gait Details: Guarded gait. Pt reporting not feeling well and increased light headedness, so mobility limited to bathroom and back.  oxygen sats initially reading at 88% on RA, however, increased to 92% upon return to supine. Checked BP and BP at 171/90 mmHg; RN notified.    Stairs             Wheelchair Mobility    Modified Rankin (Stroke Patients Only)       Balance Overall balance assessment: Needs assistance Sitting-balance support: No upper extremity supported;Feet supported Sitting balance-Leahy Scale: Good     Standing balance support: Bilateral upper extremity supported;During functional activity Standing balance-Leahy Scale: Poor Standing balance comment: Reliant on BUE support                             Cognition Arousal/Alertness: Awake/alert Behavior During Therapy: WFL for tasks assessed/performed Overall Cognitive Status: Within Functional Limits for tasks assessed                                        Exercises      General Comments        Pertinent Vitals/Pain Pain Assessment: No/denies pain    Home Living                      Prior Function            PT Goals (current goals can now be found in the care plan section) Acute Rehab PT Goals Patient Stated Goal: to go home PT Goal Formulation: With patient Time For Goal Achievement: 11/23/18 Potential to Achieve Goals: Good Progress towards  PT goals: Progressing toward goals    Frequency    Min 3X/week      PT Plan Current plan remains appropriate    Co-evaluation              AM-PAC PT "6 Clicks" Mobility   Outcome Measure  Help needed turning from your back to your side while in a flat bed without using bedrails?: None Help needed moving from lying on your back to sitting on the side of a flat bed without using bedrails?: A Little Help needed moving to and from a bed to a chair (including a wheelchair)?: A Little Help needed standing up from a chair using your arms (e.g., wheelchair or bedside chair)?: A Little Help needed to walk in hospital room?: A  Little Help needed climbing 3-5 steps with a railing? : A Little 6 Click Score: 19    End of Session Equipment Utilized During Treatment: Gait belt Activity Tolerance: Treatment limited secondary to medical complications (Comment) Patient left: in bed;with call bell/phone within reach Nurse Communication: Mobility status;Other (comment)(oxygen sats; BP ) PT Visit Diagnosis: Unsteadiness on feet (R26.81);Muscle weakness (generalized) (M62.81)     Time: 4327-6147 PT Time Calculation (min) (ACUTE ONLY): 16 min  Charges:  $Therapeutic Activity: 8-22 mins                     Leighton Ruff, PT, DPT  Acute Rehabilitation Services  Pager: [phone] Office: [phone]    Leslie Gamble 11/10/2018, 12:59 PM

## 2018-11-10 NOTE — Progress Notes (Signed)
Visited with patient to complete Spiritual Consult from RN. Patient requested prayer for anxiety and family concerns. Will continue to be available for spiritual care as needed.   Rev. [place].  Pager# [phone]

## 2018-11-11 DIAGNOSIS — F329 Major depressive disorder, single episode, unspecified: Secondary | ICD-10-CM

## 2018-11-11 LAB — GLUCOSE, CAPILLARY
Glucose-Capillary: 112 mg/dL — ABNORMAL HIGH (ref 70–99)
Glucose-Capillary: 193 mg/dL — ABNORMAL HIGH (ref 70–99)

## 2018-11-11 MED ORDER — CEFDINIR 300 MG PO CAPS: 300.0000 mg | ORAL_CAPSULE | Freq: Two times a day (BID) | ORAL | 0 refills | Status: DC

## 2018-11-11 NOTE — TOC Transition Note (Signed)
Transition of Care [facility]) - CM/SW Discharge Note   Patient Details  Name: EUREKA VALDES MRN: [id number] Date of Birth: [birth year]  Transition of Care [facility]) CM/SW Contact:  Bartholomew Crews, RN Phone Number: [phone] 11/11/2018, 10:54 AM   Clinical Narrative:    Patient to transition home today. HH orders placed with [facility]. [place] notified. No further transition of care needs identified.      Barriers to Discharge: Continued Medical Work up   Patient Goals and CMS Choice Patient states their goals for this hospitalization and ongoing recovery are:: return home CMS Medicare.gov Compare Post Acute Care list provided to:: Patient Choice offered to / list presented to : Patient  Discharge Placement                       Discharge Plan and Services   Discharge Planning Services: CM Consult Post Acute Care Choice: Home Health          DME Arranged: N/A DME Agency: NA       HH Arranged: PT, OT [place] Agency: [place] Date [place]: 11/10/18 Time HH Agency Contacted: 1300 Representative spoke with at [place]: [place] ([place]) Interventions     Readmission Risk Interventions No flowsheet data found.

## 2018-11-11 NOTE — Progress Notes (Signed)
Patient discharged to home, AVS reviewed with the patient and her son, all questions answered. Prescriptions were sent to the patients pharmacy. She verified she has all of her belongings. Son provided transportation, assisted off the unit with staff member.

## 2018-11-11 NOTE — Discharge Summary (Signed)
Physician Discharge Summary  Leslie Gamble RDE:[id number] DOB: Mar 15, 1940 DOA: 11/06/2018  PCP: Wenda Low, MD  Admit date: 11/06/2018 Discharge date: 11/11/2018  Admitted From: Home  Disposition:  Home   Recommendations for Outpatient Follow-up:  1. Follow up with Dr. Lysle Rubens in 1-2 weeks 2. Please obtain BMP in one week to check K 3. Dr. Lysle Rubens: Please obtain CXR or CT chest in 4 weeks to document resolution of effusion 4. Please follow up on the following pending results: 1. Pleural fluid cytology 2. Pleural fluid culture      Home Health: PT/RN  Equipment/Devices: None  Discharge Condition: Fair  CODE STATUS: FULL Diet recommendation: Good  Brief/Interim Summary: Leslie Gamble is a 78 y.o. F with hx DM, DM, asthma/COPD, RA on leflunomide, diverticulitis, and depression who presented with several days progressive SOB and cough.  In the ER, WBC 12K, CXR showed right pleural effusion and RLL opacity.  SpO2 89% on room air.  On antibiotics, steroids, admitted for COPD flare with pleural effusion.     PRINCIPAL HOSPITAL DIAGNOSIS: Pneumonia with parapneumonic effusion    Discharge Diagnoses:  Acute on chronic hypoxic respiratory failure due to COPD exacerbation, pneumonia, pleural effusion Patient admitted and started on Cefepime and IV steroids and bronchodilators.  She had improvement in fever and dyspnea.  Patient now mentating at baseline, taking orals.  Temp < 100 F, heart rate < 100bpm, RR < 24, SpO2 at baseline.   Stable for discharge.     Pleural effusion CXR after 3 days antibiotics still showed effusion.  Thoracentesis obtained, Exudative by Light's criteria.   Needs follow up cytology and repeat imaging in 4 weeks.  Rheumatoid arthritis  Depression  Diabetes  Hypertension   Leukocytosis  Hypomagnesemia Hypokalemia  GERD  Insomnia             Discharge Instructions  Discharge Instructions    AMB Referral to [facility]   Complete by: As directed    Please assign to community nurse for complex care and disease management follow up calls and assess for further needs. [facility] PCP does the TOC calls/follow up.  Questions please call:   Natividad Brood, RN BSN [facility] Liaison  [phone] business mobile phone Toll free office [phone]  Fax number: [phone] [email or web address]   Reason for consult: Complex disease management   Diagnoses of:  COPD/ Pneumonia Other     Other Diagnosis: Pleural effusion   Expected date of contact: 1-3 days (reserved for hospital discharges)   Discharge instructions   Complete by: As directed    From Dr. Loleta Books: You were admitted for pneumonia and we found you had an effusion (fluid collected next to the lung from the pneumonia). You were treated with antibiotics and the pneumonia resolved on your CAT scan, but the fluid was still there.  When we removed the fluid, it did appear to be a pneumonia infection-related effusion.  Take 2 more days antibiotics with Cefdinir 300 mg twice daily (take tonight, then tomorrow morning and tomorrow night)  Call Dr. Glenna Durand office today to schedule an appointment NEXT WEEK He will follow up my notes from the hospital and also your remaining lab work from the fluid around your lung  He will check on your to make sure you're continuing to do better  Resume all your other home medicines   Increase activity slowly   Complete by: As directed      Allergies as of 11/11/2018  Reactions   Avelox [moxifloxacin Hcl In Nacl] Itching, Swelling   Facial swelling   Cephalexin Swelling   Nose swelling   Ciprofloxacin Swelling   Facial swelling   Clarithromycin Swelling   Facial swelling   Clindamycin Hcl Swelling   Facial swelling   Codeine Swelling   Lip swelling   Diflucan [fluconazole] Diarrhea   Lidocaine Swelling   Facial  swelling   Metronidazole Swelling   Facial swelling   Nitrofurantoin Monohyd Macro Swelling   Facial swelling   Other Itching   Reaction toMagic mouthwash    Penicillins Swelling   Facial swelling Did it involve swelling of the face/tongue/throat, SOB, or low BP? Yes Did it involve sudden or severe rash/hives, skin peeling, or any reaction on the inside of your mouth or nose? No Did you need to seek medical attention at a hospital or doctor's office? No When did it last happen?young adult If all above answers are "NO", may proceed with cephalosporin use.   Shrimp [shellfish Allergy] Swelling   In nose and lips   Sitagliptin Swelling   Facial swelling   Sulfa Antibiotics Swelling   Facial swelling   Tetracyclines & Related Swelling   Facial swelling   Tramadol Hcl Swelling    facial swelling, dyspnea      Medication List    TAKE these medications   acetaminophen 500 MG tablet Commonly known as: TYLENOL Take 1,000 mg by mouth every 6 (six) hours as needed for headache (pain).   albuterol 108 (90 Base) MCG/ACT inhaler Commonly known as: VENTOLIN HFA Inhale 1-2 puffs into the lungs every 6 (six) hours as needed for wheezing or shortness of breath.   ALPRAZolam 0.25 MG tablet Commonly known as: XANAX Take 0.25 mg by mouth 2 (two) times daily as needed for anxiety.   amLODipine 10 MG tablet Commonly known as: NORVASC Take 10 mg by mouth 2 (two) times daily.   aspirin 81 MG EC tablet Take 81 mg by mouth every morning.   Breo Ellipta 100-25 MCG/INH Aepb Generic drug: fluticasone furoate-vilanterol Inhale 1 puff into the lungs every morning.   cefdinir 300 MG capsule Commonly known as: OMNICEF Take 1 capsule (300 mg total) by mouth 2 (two) times daily.   DULoxetine 60 MG capsule Commonly known as: CYMBALTA Take 1 capsule (60 mg total) by mouth 2 (two) times daily.   ferrous sulfate 325 (65 FE) MG EC tablet Take 325 mg by mouth every morning.   fluticasone 50  MCG/ACT nasal spray Commonly known as: FLONASE Place 2 sprays into both nostrils daily as needed for allergies.   hydrOXYzine 10 MG tablet Commonly known as: ATARAX/VISTARIL Take 1 tablet (10 mg total) by mouth daily as needed for anxiety.   insulin aspart 100 UNIT/ML injection Commonly known as: novoLOG Inject 0-9 Units into the skin 3 (three) times daily with meals. CBG < 70: implement hypoglycemia protocol CBG 70 - 120: 0 units CBG 121 - 150: 1 unit CBG 151 - 200: 2 units CBG 201 - 250: 3 units CBG 251 - 300: 5 units CBG 301 - 350: 7 units CBG 351 - 400: 9 units CBG > 400: call MD.   leflunomide 20 MG tablet Commonly known as: ARAVA Take 20 mg by mouth every morning.   losartan 100 MG tablet Commonly known as: Cozaar Take 1 tablet (100 mg total) by mouth daily. What changed: when to take this   metFORMIN 1000 MG tablet Commonly known as: GLUCOPHAGE Take 1,000 mg  by mouth 2 (two) times daily.   metoprolol succinate 25 MG 24 hr tablet Commonly known as: TOPROL-XL Take 25 mg by mouth daily.   mirtazapine 30 MG tablet Commonly known as: REMERON Take 1 tablet (30 mg total) by mouth at bedtime.   multivitamin with minerals Tabs tablet Take 1 tablet by mouth every morning. [facility]   Oxycodone HCl 10 MG Tabs Take 0.5 tablets (5 mg total) by mouth every 6 (six) hours as needed. For knee pain What changed:   how much to take  reasons to take this  additional instructions   pantoprazole 40 MG tablet Commonly known as: PROTONIX Take 40 mg by mouth every morning.   polyethylene glycol 17 g packet Commonly known as: MIRALAX / GLYCOLAX Take 17 g by mouth daily. What changed:   when to take this  reasons to take this   pravastatin 10 MG tablet Commonly known as: PRAVACHOL Take 10 mg by mouth every morning.   Systane 0.4-0.3 % Gel ophthalmic gel Generic drug: Polyethyl Glycol-Propyl Glycol Place 1 application into both eyes daily as needed (dry  eyes).      Follow-up Information    [facility], [facility] Follow up.   Specialty: [place] Why: physical and occupational therapies will continue at home Contact information: [address] [place] [address] [phone]        Wenda Low, MD. Schedule an appointment as soon as possible for a visit in 1 week(s).   Specialty: Internal Medicine Contact information: [address] [place] [address] [phone]          Allergies  Allergen Reactions  . Avelox [Moxifloxacin Hcl In Nacl] Itching and Swelling    Facial swelling  . Cephalexin Swelling    Nose swelling  . Ciprofloxacin Swelling    Facial swelling  . Clarithromycin Swelling    Facial swelling  . Clindamycin Hcl Swelling    Facial swelling  . Codeine Swelling    Lip swelling  . Diflucan [Fluconazole] Diarrhea  . Lidocaine Swelling    Facial swelling  . Metronidazole Swelling    Facial swelling  . Nitrofurantoin Monohyd Macro Swelling    Facial swelling  . Other Itching    Reaction toMagic mouthwash   . Penicillins Swelling    Facial swelling Did it involve swelling of the face/tongue/throat, SOB, or low BP? Yes Did it involve sudden or severe rash/hives, skin peeling, or any reaction on the inside of your mouth or nose? No Did you need to seek medical attention at a hospital or doctor's office? No When did it last happen?young adult If all above answers are "NO", may proceed with cephalosporin use.  . Shrimp [Shellfish Allergy] Swelling    In nose and lips  . Sitagliptin Swelling    Facial swelling  . Sulfa Antibiotics Swelling    Facial swelling  . Tetracyclines & Related Swelling    Facial swelling  . Tramadol Hcl Swelling     facial swelling, dyspnea    Consultations:  IR   Procedures/Studies: Dg Chest 1 View  Result Date: 11/10/2018 CLINICAL DATA:  Status post thoracentesis EXAM: CHEST  1 VIEW COMPARISON:  11/09/2018  FINDINGS: Cardiac shadow is stable. Tortuous aorta is noted accentuated by the portable technique. Decrease in right-sided pleural effusion is noted following thoracentesis. No sizable pneumothorax is seen. No bony abnormality is noted. IMPRESSION: No evidence of post thoracentesis pneumothorax. Electronically Signed   By: Elta Guadeloupe  Lukens M.D.   On: 11/10/2018 16:04   Dg Chest 2 View  Result Date: 11/09/2018 CLINICAL DATA:  Evaluate pleural effusions. Shortness of breath. EXAM: CHEST - 2 VIEW COMPARISON:  11/06/2018 FINDINGS: Are normal heart size. Aortic atherosclerosis. There is a persistent right pleural effusion. Volume loss, scratch set atelectasis or consolidation within the right base is identified. Left lung is clear. IMPRESSION: 1. No change in right pleural effusion. 2. Persistent right base atelectasis or consolidation. Consider further evaluation with contrast enhanced CT of the chest. To assess for underlying predisposing abnormality Electronically Signed   By: Kerby Moors M.D.   On: 11/09/2018 13:27   Dg Chest 2 View  Result Date: 11/06/2018 CLINICAL DATA:  78 year old female with history of shortness of breath. EXAM: CHEST - 2 VIEW COMPARISON:  Chest x-ray 10/06/2018. FINDINGS: Moderate right pleural effusion. Opacity at the right base which may reflect areas of atelectasis and/or consolidation. Left lung is clear, with exception of minimal scarring in the left mid lung (unchanged). No left pleural effusion. No evidence of pulmonary edema. Heart size is normal. Upper mediastinal contours are within normal limits. Aortic atherosclerosis. IMPRESSION: 1. Moderate right pleural effusion with areas of atelectasis and/or consolidation in the right middle and lower lobe. 2. Aortic atherosclerosis. Electronically Signed   By: Vinnie Langton M.D.   On: 11/06/2018 18:13   Ct Chest W Contrast  Result Date: 11/10/2018 CLINICAL DATA:  Pleural effusion. Right lower lobe opacity. EXAM: CT CHEST WITH  CONTRAST TECHNIQUE: Multidetector CT imaging of the chest was performed during intravenous contrast administration. CONTRAST:  4mL OMNIPAQUE IOHEXOL 300 MG/ML  SOLN COMPARISON:  Radiographs yesterday, as well as 11/06/2018. No prior CT. FINDINGS: Cardiovascular: Aortic atherosclerosis. No aneurysm. Descending thoracic aorta is tortuous. Normal heart size. There are coronary artery calcifications. No pericardial effusion. Physiologic fluid in the superior pericardial recess. Mediastinum/Nodes: No enlarged mediastinal or hilar lymph nodes. No esophageal wall thickening. No visualized thyroid nodule. Lungs/Pleura: Moderate-sized free-flowing right pleural effusion. Pleural fluid measures simple fluid density. There is associated compressive atelectasis, including complete atelectasis of the right lower lobe. This limits parenchymal assessment. Questionable pleural enhancement, series 3, image 104. Mild emphysema. No evidence of pneumonia. Linear subsegmental atelectasis in the lingula and dependent left lower lobe. No left pleural effusion. No pulmonary edema. No evidence of pulmonary mass. Minimal dependent mucus in the trachea. Upper Abdomen: Upper abdominal atherosclerosis. Mild fatty atrophy of the pancreas. No acute findings. Musculoskeletal: There are no acute or suspicious osseous abnormalities. IMPRESSION: 1. Moderate-sized free-flowing right pleural effusion with adjacent compressive atelectasis, including complete atelectasis of the right lower lobe. Questionable area of pleural enhancement in the right lower lobe which may be inflammatory, however neoplastic enhancement is not excluded. There are no other findings to suggest neoplasm in thorax. Consider fluid sampling. 2. Mild emphysema. 3. Aortic atherosclerosis.  Coronary artery calcifications. Aortic Atherosclerosis (ICD10-I70.0) and Emphysema (ICD10-J43.9). Electronically Signed   By: Keith Rake M.D.   On: 11/10/2018 03:13   Ir Thoracentesis Asp  Pleural Space W/img Guide  Result Date: 11/10/2018 INDICATION: Patient admitted with shortness of breath, found to have right pleural effusion. Request is made for diagnostic and therapeutic right thoracentesis. EXAM: ULTRASOUND GUIDED RIGHT DIAGNOSTIC AND THERAPEUTIC THORACENTESIS MEDICATIONS: 8 mL 1% tetracaine COMPLICATIONS: None immediate. PROCEDURE: An ultrasound guided thoracentesis was thoroughly discussed with the patient and questions answered. The benefits, risks, alternatives and complications were also discussed. The patient understands and wishes to proceed with the procedure. Written consent was obtained.  Ultrasound was performed to localize and mark an adequate pocket of fluid in the right chest. The area was then prepped and draped in the normal sterile fashion. 1% Lidocaine was used for local anesthesia. Under ultrasound guidance a 6 Fr Safe-T-Centesis catheter was introduced. Thoracentesis was performed. The catheter was removed and a dressing applied. FINDINGS: A total of approximately 1.3 liters of amber fluid was removed. Samples were sent to the laboratory as requested by the clinical team. IMPRESSION: Successful ultrasound guided diagnostic and therapeutic right thoracentesis yielding 1.3 liters of pleural fluid. Read by: Brynda Greathouse PA-C Electronically Signed   By: Jerilynn Mages.  Shick M.D.   On: 11/10/2018 16:13      Subjective: No fever, sputum, hemopytsis, chest pain, confusion, vomiting.  Discharge Exam: Vitals:   11/11/18 0752 11/11/18 0900  BP:  (!) 159/109  Pulse:  82  Resp:  18  Temp:  98.3 F (36.8 C)  SpO2: 95% 92%   Vitals:   11/10/18 2139 11/11/18 0416 11/11/18 0752 11/11/18 0900  BP: (!) 156/76 (!) 164/79  (!) 159/109  Pulse: 85 76  82  Resp: 18 18  18   Temp: 97.8 F (36.6 C) 98 F (36.7 C)  98.3 F (36.8 C)  TempSrc: Oral Oral  Oral  SpO2: (!) 88% 93% 95% 92%  Weight:      Height:        General: Pt is alert, awake, not in acute  distress Cardiovascular: RRR, nl S1-S2, no murmurs appreciated.   No LE edema.   Respiratory: Normal respiratory rate and rhythm.  CTAB without rales or wheezes. Abdominal: Abdomen soft and non-tender.  No distension or HSM.   Neuro/Psych: Strength symmetric in upper and lower extremities.  Judgment and insight appear normal.   The results of significant diagnostics from this hospitalization (including imaging, microbiology, ancillary and laboratory) are listed below for reference.     Microbiology: Recent Results (from the past 240 hour(s))  Respiratory Panel by PCR     Status: None   Collection Time: 11/06/18 10:42 PM   Specimen: Nasopharyngeal Swab; Respiratory  Result Value Ref Range Status   Adenovirus NOT DETECTED NOT DETECTED Final   Coronavirus 229E NOT DETECTED NOT DETECTED Final    Comment: (NOTE) The Coronavirus on the Respiratory Panel, DOES NOT test for the novel  Coronavirus (2019 nCoV)    Coronavirus HKU1 NOT DETECTED NOT DETECTED Final   Coronavirus NL63 NOT DETECTED NOT DETECTED Final   Coronavirus OC43 NOT DETECTED NOT DETECTED Final   Metapneumovirus NOT DETECTED NOT DETECTED Final   Rhinovirus / Enterovirus NOT DETECTED NOT DETECTED Final   Influenza A NOT DETECTED NOT DETECTED Final   Influenza B NOT DETECTED NOT DETECTED Final   Parainfluenza Virus 1 NOT DETECTED NOT DETECTED Final   Parainfluenza Virus 2 NOT DETECTED NOT DETECTED Final   Parainfluenza Virus 3 NOT DETECTED NOT DETECTED Final   Parainfluenza Virus 4 NOT DETECTED NOT DETECTED Final   Respiratory Syncytial Virus NOT DETECTED NOT DETECTED Final   Bordetella pertussis NOT DETECTED NOT DETECTED Final   Chlamydophila pneumoniae NOT DETECTED NOT DETECTED Final   Mycoplasma pneumoniae NOT DETECTED NOT DETECTED Final    Comment: Performed at [facility], [address] [place], [place] [address]  SARS Coronavirus 2 by RT PCR (hospital order, performed in [facility] hospital lab)  Nasopharyngeal Nasopharyngeal Swab     Status: None   Collection Time: 11/06/18 11:05 PM   Specimen: Nasopharyngeal Swab  Result Value  Ref Range Status   SARS Coronavirus 2 NEGATIVE NEGATIVE Final    Comment: (NOTE) If result is NEGATIVE SARS-CoV-2 target nucleic acids are NOT DETECTED. The SARS-CoV-2 RNA is generally detectable in upper and lower  respiratory specimens during the acute phase of infection. The lowest  concentration of SARS-CoV-2 viral copies this assay can detect is 250  copies / mL. A negative result does not preclude SARS-CoV-2 infection  and should not be used as the sole basis for treatment or other  patient management decisions.  A negative result may occur with  improper specimen collection / handling, submission of specimen other  than nasopharyngeal swab, presence of viral mutation(s) within the  areas targeted by this assay, and inadequate number of viral copies  (<250 copies / mL). A negative result must be combined with clinical  observations, patient history, and epidemiological information. If result is POSITIVE SARS-CoV-2 target nucleic acids are DETECTED. The SARS-CoV-2 RNA is generally detectable in upper and lower  respiratory specimens dur ing the acute phase of infection.  Positive  results are indicative of active infection with SARS-CoV-2.  Clinical  correlation with patient history and other diagnostic information is  necessary to determine patient infection status.  Positive results do  not rule out bacterial infection or co-infection with other viruses. If result is PRESUMPTIVE POSTIVE SARS-CoV-2 nucleic acids MAY BE PRESENT.   A presumptive positive result was obtained on the submitted specimen  and confirmed on repeat testing.  While 2019 novel coronavirus  (SARS-CoV-2) nucleic acids may be present in the submitted sample  additional confirmatory testing may be necessary for epidemiological  and / or clinical management purposes  to  differentiate between  SARS-CoV-2 and other Sarbecovirus currently known to infect humans.  If clinically indicated additional testing with an alternate test  methodology [phone]) is advised. The SARS-CoV-2 RNA is generally  detectable in upper and lower respiratory sp ecimens during the acute  phase of infection. The expected result is Negative. Fact Sheet for Patients:  StrictlyIdeas.no Fact Sheet for Healthcare Providers: BankingDealers.co.za This test is not yet approved or cleared by the [place] FDA and has been authorized for detection and/or diagnosis of SARS-CoV-2 by FDA under an Emergency Use Authorization (EUA).  This EUA will remain in effect (meaning this test can be used) for the duration of the COVID-19 declaration under Section 564(b)(1) of the Act, 21 U.S.C. section 360bbb-3(b)(1), unless the authorization is terminated or revoked sooner. Performed at [facility], [place] [address] [place], [place] [address]   Culture, blood (Routine X 2) w Reflex to ID Panel     Status: None (Preliminary result)   Collection Time: 11/07/18  1:00 AM   Specimen: BLOOD RIGHT FOREARM  Result Value Ref Range Status   Specimen Description BLOOD RIGHT FOREARM  Final   Special Requests   Final    BOTTLES DRAWN AEROBIC AND ANAEROBIC Blood Culture adequate volume   Culture   Final    NO GROWTH 4 DAYS Performed at [facility], [place] [address], [place], [place] [address]    Report Status PENDING  Incomplete  Culture, blood (Routine X 2) w Reflex to ID Panel     Status: None (Preliminary result)   Collection Time: 11/07/18  1:08 AM   Specimen: BLOOD LEFT HAND  Result Value Ref Range Status   Specimen Description BLOOD LEFT HAND  Final   Special Requests   Final    BOTTLES DRAWN AEROBIC AND ANAEROBIC  Blood Culture adequate volume   Culture   Final    NO GROWTH 4 DAYS Performed at [facility] [address],  [place], [place] [address]    Report Status PENDING  Incomplete  Culture, body fluid-bottle     Status: None (Preliminary result)   Collection Time: 11/10/18  3:43 PM   Specimen: Fluid  Result Value Ref Range Status   Specimen Description FLUID PLEURAL RIGHT  Final   Special Requests BOTTLES DRAWN AEROBIC AND ANAEROBIC  Final   Culture   Final    NO GROWTH < 24 HOURS Performed at [facility], [place] [address] [place], [place] [address]    Report Status PENDING  Incomplete  Gram stain     Status: None   Collection Time: 11/10/18  3:43 PM   Specimen: Fluid  Result Value Ref Range Status   Specimen Description FLUID PLEURAL RIGHT  Final   Special Requests NONE  Final   Gram Stain   Final    WBC PRESENT, PREDOMINANTLY MONONUCLEAR NO ORGANISMS SEEN CYTOSPIN SMEAR Performed at [facility], [place] [address], [place], [place] [address]    Report Status 11/10/2018 FINAL  Final     Labs: BNP (last 3 results) No results for input(s): BNP in the last 8760 hours. Basic Metabolic Panel: Recent Labs  Lab 11/06/18 1653 11/07/18 0500 11/07/18 1018 11/08/18 0607 11/09/18 0501 11/10/18 0404  NA 138  --  137 139 141 138  K 3.1*  --  3.8 3.7 3.4* 3.2*  CL 101  --  103 103 106 103  CO2 20*  --  21* 23 24 23   GLUCOSE 197*  --  308* 271* 155* 268*  BUN 14  --  16 18 19 19   CREATININE 0.83  --  0.67 0.71 0.68 0.71  CALCIUM 9.0  --  8.7* 8.6* 8.5* 8.3*  MG  --  1.2*  --  1.6* 1.7  --   PHOS  --   --  2.1* 3.3 1.9*  --    Liver Function Tests: Recent Labs  Lab 11/07/18 1018 11/08/18 0607 11/09/18 0501 11/10/18 0404  AST  --   --   --  19  ALT  --   --   --  18  ALKPHOS  --   --   --  71  BILITOT  --   --   --  0.2*  PROT  --   --   --  6.6  ALBUMIN 3.2* 3.2* 3.0* 3.2*   No results for input(s): LIPASE, AMYLASE in the last 168 hours. No results for input(s): AMMONIA in the last 168 hours. CBC: Recent Labs  Lab 11/06/18 1653 11/07/18 1018 11/08/18 0607 11/09/18 0501  11/10/18 0404  WBC 12.0* 5.4 10.8* 15.7* 11.9*  NEUTROABS 9.2* 4.7 9.7* 12.8*  --   HGB 13.3 13.2 12.5 12.9 13.1  HCT 41.3 39.2 38.3 39.2 39.6  MCV 86.0 85.0 85.1 84.7 83.5  PLT 370 364 412* 412* 436*   Cardiac Enzymes: No results for input(s): CKTOTAL, CKMB, CKMBINDEX, TROPONINI in the last 168 hours. BNP: Invalid input(s): POCBNP CBG: Recent Labs  Lab 11/10/18 1145 11/10/18 1630 11/10/18 2137 11/11/18 0656 11/11/18 1150  GLUCAP 245* 180* 209* 112* 193*   D-Dimer No results for input(s): DDIMER in the last 72 hours. Hgb A1c No results for input(s): HGBA1C in the last 72 hours. Lipid Profile No results for input(s): CHOL, HDL, LDLCALC, TRIG, CHOLHDL, LDLDIRECT  in the last 72 hours. Thyroid function studies No results for input(s): TSH, T4TOTAL, T3FREE, THYROIDAB in the last 72 hours.  Invalid input(s): FREET3 Anemia work up No results for input(s): VITAMINB12, FOLATE, FERRITIN, TIBC, IRON, RETICCTPCT in the last 72 hours. Urinalysis    Component Value Date/Time   COLORURINE YELLOW 11/07/2018 0001   APPEARANCEUR HAZY (A) 11/07/2018 0001   LABSPEC 1.019 11/07/2018 0001   PHURINE 6.0 11/07/2018 0001   GLUCOSEU 50 (A) 11/07/2018 0001   GLUCOSEU NEGATIVE 08/31/2012 0836   HGBUR NEGATIVE 11/07/2018 0001   BILIRUBINUR NEGATIVE 11/07/2018 0001   KETONESUR NEGATIVE 11/07/2018 0001   PROTEINUR >=300 (A) 11/07/2018 0001   UROBILINOGEN 0.2 05/31/2014 1402   NITRITE NEGATIVE 11/07/2018 0001   LEUKOCYTESUR NEGATIVE 11/07/2018 0001   Sepsis Labs Invalid input(s): PROCALCITONIN,  WBC,  LACTICIDVEN Microbiology Recent Results (from the past 240 hour(s))  Respiratory Panel by PCR     Status: None   Collection Time: 11/06/18 10:42 PM   Specimen: Nasopharyngeal Swab; Respiratory  Result Value Ref Range Status   Adenovirus NOT DETECTED NOT DETECTED Final   Coronavirus 229E NOT DETECTED NOT DETECTED Final    Comment: (NOTE) The Coronavirus on the Respiratory Panel, DOES NOT  test for the novel  Coronavirus (2019 nCoV)    Coronavirus HKU1 NOT DETECTED NOT DETECTED Final   Coronavirus NL63 NOT DETECTED NOT DETECTED Final   Coronavirus OC43 NOT DETECTED NOT DETECTED Final   Metapneumovirus NOT DETECTED NOT DETECTED Final   Rhinovirus / Enterovirus NOT DETECTED NOT DETECTED Final   Influenza A NOT DETECTED NOT DETECTED Final   Influenza B NOT DETECTED NOT DETECTED Final   Parainfluenza Virus 1 NOT DETECTED NOT DETECTED Final   Parainfluenza Virus 2 NOT DETECTED NOT DETECTED Final   Parainfluenza Virus 3 NOT DETECTED NOT DETECTED Final   Parainfluenza Virus 4 NOT DETECTED NOT DETECTED Final   Respiratory Syncytial Virus NOT DETECTED NOT DETECTED Final   Bordetella pertussis NOT DETECTED NOT DETECTED Final   Chlamydophila pneumoniae NOT DETECTED NOT DETECTED Final   Mycoplasma pneumoniae NOT DETECTED NOT DETECTED Final    Comment: Performed at [facility], [place]. [address] [place],  [address]  SARS Coronavirus 2 by RT PCR (hospital order, performed in [facility] hospital lab) Nasopharyngeal Nasopharyngeal Swab     Status: None   Collection Time: 11/06/18 11:05 PM   Specimen: Nasopharyngeal Swab  Result Value Ref Range Status   SARS Coronavirus 2 NEGATIVE NEGATIVE Final    Comment: (NOTE) If result is NEGATIVE SARS-CoV-2 target nucleic acids are NOT DETECTED. The SARS-CoV-2 RNA is generally detectable in upper and lower  respiratory specimens during the acute phase of infection. The lowest  concentration of SARS-CoV-2 viral copies this assay can detect is 250  copies / mL. A negative result does not preclude SARS-CoV-2 infection  and should not be used as the sole basis for treatment or other  patient management decisions.  A negative result may occur with  improper specimen collection / handling, submission of specimen other  than nasopharyngeal swab, presence of viral mutation(s) within the  areas targeted by this assay, and inadequate number  of viral copies  (<250 copies / mL). A negative result must be combined with clinical  observations, patient history, and epidemiological information. If result is POSITIVE SARS-CoV-2 target nucleic acids are DETECTED. The SARS-CoV-2 RNA is generally detectable in upper and lower  respiratory specimens dur ing the acute phase of infection.  Positive  results  are indicative of active infection with SARS-CoV-2.  Clinical  correlation with patient history and other diagnostic information is  necessary to determine patient infection status.  Positive results do  not rule out bacterial infection or co-infection with other viruses. If result is PRESUMPTIVE POSTIVE SARS-CoV-2 nucleic acids MAY BE PRESENT.   A presumptive positive result was obtained on the submitted specimen  and confirmed on repeat testing.  While 2019 novel coronavirus  (SARS-CoV-2) nucleic acids may be present in the submitted sample  additional confirmatory testing may be necessary for epidemiological  and / or clinical management purposes  to differentiate between  SARS-CoV-2 and other Sarbecovirus currently known to infect humans.  If clinically indicated additional testing with an alternate test  methodology [phone]) is advised. The SARS-CoV-2 RNA is generally  detectable in upper and lower respiratory sp ecimens during the acute  phase of infection. The expected result is Negative. Fact Sheet for Patients:  StrictlyIdeas.no Fact Sheet for Healthcare Providers: BankingDealers.co.za This test is not yet approved or cleared by the [place] FDA and has been authorized for detection and/or diagnosis of SARS-CoV-2 by FDA under an Emergency Use Authorization (EUA).  This EUA will remain in effect (meaning this test can be used) for the duration of the COVID-19 declaration under Section 564(b)(1) of the Act, 21 U.S.C. section 360bbb-3(b)(1), unless the authorization is  terminated or revoked sooner. Performed at [facility], [place] [address], [place], [place] [address]   Culture, blood (Routine X 2) w Reflex to ID Panel     Status: None (Preliminary result)   Collection Time: 11/07/18  1:00 AM   Specimen: BLOOD RIGHT FOREARM  Result Value Ref Range Status   Specimen Description BLOOD RIGHT FOREARM  Final   Special Requests   Final    BOTTLES DRAWN AEROBIC AND ANAEROBIC Blood Culture adequate volume   Culture   Final    NO GROWTH 4 DAYS Performed at [facility], [place] [address] [place], [place] [address]    Report Status PENDING  Incomplete  Culture, blood (Routine X 2) w Reflex to ID Panel     Status: None (Preliminary result)   Collection Time: 11/07/18  1:08 AM   Specimen: BLOOD LEFT HAND  Result Value Ref Range Status   Specimen Description BLOOD LEFT HAND  Final   Special Requests   Final    BOTTLES DRAWN AEROBIC AND ANAEROBIC Blood Culture adequate volume   Culture   Final    NO GROWTH 4 DAYS Performed at [facility], [place] [address] [place], [place] [address]    Report Status PENDING  Incomplete  Culture, body fluid-bottle     Status: None (Preliminary result)   Collection Time: 11/10/18  3:43 PM   Specimen: Fluid  Result Value Ref Range Status   Specimen Description FLUID PLEURAL RIGHT  Final   Special Requests BOTTLES DRAWN AEROBIC AND ANAEROBIC  Final   Culture   Final    NO GROWTH < 24 HOURS Performed at [facility], [place] [address] [place], [place] [address]    Report Status PENDING  Incomplete  Gram stain     Status: None   Collection Time: 11/10/18  3:43 PM   Specimen: Fluid  Result Value Ref Range Status   Specimen Description FLUID PLEURAL RIGHT  Final   Special Requests NONE  Final   Gram Stain   Final    WBC PRESENT, PREDOMINANTLY MONONUCLEAR NO ORGANISMS SEEN  CYTOSPIN SMEAR Performed at [facility], [place] [address], [place], [place] [address]    Report Status 11/10/2018 FINAL   Final     Time coordinating discharge: 40 minutes      SIGNED:   Edwin Dada, MD  [facility] 11/11/2018, 5:14 PM

## 2018-11-11 NOTE — Consult Note (Signed)
   [facility] CM Inpatient Consult   11/11/2018  [name] October 10, 1940 [id number]  Patient screened for high risk score for unplanned readmission score  and/or for hospitalizations to check if potential [place] Management services are needed.  Review of patient's medical record reveals patient is per MD progress notes as follows which includes but not limited to: Mrs. Leslie Gamble is a 78 y.o. F with hx DM, DM, asthma/COPD, RA on leflunomide, diverticulitis, and depression who presented with several days progressive SOB and cough  Primary Care Provider is Wenda Low, MD and this primary care provider's office is listed to do the Transition of Care follow up.  Continue to follow progress and disposition to assess for post hospital care management needs.   Plan:  Call attempts to the patient's room without success phone is with a busy signal and outreach to inpatient [facility] RNCM regarding follow up.   Please place a [facility] Care Management consult as appropriate and for questions contact:   Natividad Brood, RN BSN [place] Liaison  [phone] business mobile phone Toll free office [phone]  Fax number: [phone] [email or web address]

## 2018-11-12 DIAGNOSIS — E876 Hypokalemia: Secondary | ICD-10-CM | POA: Diagnosis not present

## 2018-11-12 DIAGNOSIS — E119 Type 2 diabetes mellitus without complications: Secondary | ICD-10-CM | POA: Diagnosis not present

## 2018-11-12 DIAGNOSIS — M47816 Spondylosis without myelopathy or radiculopathy, lumbar region: Secondary | ICD-10-CM | POA: Diagnosis not present

## 2018-11-12 DIAGNOSIS — D72829 Elevated white blood cell count, unspecified: Secondary | ICD-10-CM | POA: Diagnosis not present

## 2018-11-12 DIAGNOSIS — I10 Essential (primary) hypertension: Secondary | ICD-10-CM | POA: Diagnosis not present

## 2018-11-12 DIAGNOSIS — K219 Gastro-esophageal reflux disease without esophagitis: Secondary | ICD-10-CM | POA: Diagnosis not present

## 2018-11-12 DIAGNOSIS — J189 Pneumonia, unspecified organism: Secondary | ICD-10-CM | POA: Diagnosis not present

## 2018-11-12 DIAGNOSIS — F329 Major depressive disorder, single episode, unspecified: Secondary | ICD-10-CM | POA: Diagnosis not present

## 2018-11-12 DIAGNOSIS — J45909 Unspecified asthma, uncomplicated: Secondary | ICD-10-CM | POA: Diagnosis not present

## 2018-11-12 DIAGNOSIS — J90 Pleural effusion, not elsewhere classified: Secondary | ICD-10-CM | POA: Diagnosis not present

## 2018-11-12 DIAGNOSIS — J9621 Acute and chronic respiratory failure with hypoxia: Secondary | ICD-10-CM | POA: Diagnosis not present

## 2018-11-12 DIAGNOSIS — M170 Bilateral primary osteoarthritis of knee: Secondary | ICD-10-CM | POA: Diagnosis not present

## 2018-11-12 DIAGNOSIS — M069 Rheumatoid arthritis, unspecified: Secondary | ICD-10-CM | POA: Diagnosis not present

## 2018-11-12 DIAGNOSIS — F411 Generalized anxiety disorder: Secondary | ICD-10-CM | POA: Diagnosis not present

## 2018-11-12 DIAGNOSIS — M19041 Primary osteoarthritis, right hand: Secondary | ICD-10-CM | POA: Diagnosis not present

## 2018-11-12 DIAGNOSIS — J439 Emphysema, unspecified: Secondary | ICD-10-CM | POA: Diagnosis not present

## 2018-11-12 DIAGNOSIS — M25462 Effusion, left knee: Secondary | ICD-10-CM | POA: Diagnosis not present

## 2018-11-12 DIAGNOSIS — G319 Degenerative disease of nervous system, unspecified: Secondary | ICD-10-CM | POA: Diagnosis not present

## 2018-11-12 DIAGNOSIS — M4316 Spondylolisthesis, lumbar region: Secondary | ICD-10-CM | POA: Diagnosis not present

## 2018-11-12 DIAGNOSIS — E785 Hyperlipidemia, unspecified: Secondary | ICD-10-CM | POA: Diagnosis not present

## 2018-11-12 DIAGNOSIS — M4050 Lordosis, unspecified, site unspecified: Secondary | ICD-10-CM | POA: Diagnosis not present

## 2018-11-12 DIAGNOSIS — M160 Bilateral primary osteoarthritis of hip: Secondary | ICD-10-CM | POA: Diagnosis not present

## 2018-11-12 DIAGNOSIS — M19042 Primary osteoarthritis, left hand: Secondary | ICD-10-CM | POA: Diagnosis not present

## 2018-11-12 DIAGNOSIS — I358 Other nonrheumatic aortic valve disorders: Secondary | ICD-10-CM | POA: Diagnosis not present

## 2018-11-12 DIAGNOSIS — M76892 Other specified enthesopathies of left lower limb, excluding foot: Secondary | ICD-10-CM | POA: Diagnosis not present

## 2018-11-12 LAB — CULTURE, BLOOD (ROUTINE X 2)
Culture: NO GROWTH
Culture: NO GROWTH
Special Requests: ADEQUATE
Special Requests: ADEQUATE

## 2018-11-15 ENCOUNTER — Other Ambulatory Visit: Payer: Self-pay

## 2018-11-15 LAB — CULTURE, BODY FLUID W GRAM STAIN -BOTTLE: Culture: NO GROWTH

## 2018-11-15 LAB — CYTOLOGY - NON PAP

## 2018-11-15 NOTE — Patient Outreach (Signed)
[facility] initial outreach call:  Placed call to patient and explained purpose of call. Patient very hesitant to talk with me via phone.  Explained in great detail the [facility] program. Patient reports that she is feeling fairly well. Reports she has all her medications and is taking them as prescribed. Reports her breathing is better. Reports follow up planned with primary MD on 11/17/2018.    Reviewed with patient benefits of [facility] program and patient declines needs for services. Offered to send an outreach letter if patient changed her mind. She did agree to a letter and I confirmed address.  PLAN: Geophysicist/field seismologist Will be available if patient changes her mind. Will close case as patient declines needs. Follow up planned with PCP.  Tomasa Rand, RN, BSN, CEN [facility] [employer] [phone]

## 2018-11-16 DIAGNOSIS — E119 Type 2 diabetes mellitus without complications: Secondary | ICD-10-CM | POA: Diagnosis not present

## 2018-11-16 DIAGNOSIS — M1990 Unspecified osteoarthritis, unspecified site: Secondary | ICD-10-CM | POA: Diagnosis not present

## 2018-11-16 DIAGNOSIS — M069 Rheumatoid arthritis, unspecified: Secondary | ICD-10-CM | POA: Diagnosis not present

## 2018-11-16 DIAGNOSIS — J45909 Unspecified asthma, uncomplicated: Secondary | ICD-10-CM | POA: Diagnosis not present

## 2018-11-16 DIAGNOSIS — J449 Chronic obstructive pulmonary disease, unspecified: Secondary | ICD-10-CM | POA: Diagnosis not present

## 2018-11-16 DIAGNOSIS — E1165 Type 2 diabetes mellitus with hyperglycemia: Secondary | ICD-10-CM | POA: Diagnosis not present

## 2018-11-16 DIAGNOSIS — E1140 Type 2 diabetes mellitus with diabetic neuropathy, unspecified: Secondary | ICD-10-CM | POA: Diagnosis not present

## 2018-11-16 DIAGNOSIS — I10 Essential (primary) hypertension: Secondary | ICD-10-CM | POA: Diagnosis not present

## 2018-11-16 DIAGNOSIS — M8580 Other specified disorders of bone density and structure, unspecified site: Secondary | ICD-10-CM | POA: Diagnosis not present

## 2018-11-16 DIAGNOSIS — F324 Major depressive disorder, single episode, in partial remission: Secondary | ICD-10-CM | POA: Diagnosis not present

## 2018-11-16 DIAGNOSIS — M179 Osteoarthritis of knee, unspecified: Secondary | ICD-10-CM | POA: Diagnosis not present

## 2018-11-17 DIAGNOSIS — I10 Essential (primary) hypertension: Secondary | ICD-10-CM | POA: Diagnosis not present

## 2018-11-17 DIAGNOSIS — J189 Pneumonia, unspecified organism: Secondary | ICD-10-CM | POA: Diagnosis not present

## 2018-11-17 DIAGNOSIS — R627 Adult failure to thrive: Secondary | ICD-10-CM | POA: Diagnosis not present

## 2018-11-17 DIAGNOSIS — J90 Pleural effusion, not elsewhere classified: Secondary | ICD-10-CM | POA: Diagnosis not present

## 2018-11-17 DIAGNOSIS — Z1389 Encounter for screening for other disorder: Secondary | ICD-10-CM | POA: Diagnosis not present

## 2018-11-21 ENCOUNTER — Encounter (HOSPITAL_COMMUNITY): Payer: Self-pay | Admitting: Psychiatry

## 2018-11-21 ENCOUNTER — Other Ambulatory Visit: Payer: Self-pay

## 2018-11-21 ENCOUNTER — Ambulatory Visit (INDEPENDENT_AMBULATORY_CARE_PROVIDER_SITE_OTHER): Payer: Medicare Other | Admitting: Psychiatry

## 2018-11-21 DIAGNOSIS — F331 Major depressive disorder, recurrent, moderate: Secondary | ICD-10-CM | POA: Diagnosis not present

## 2018-11-21 DIAGNOSIS — F411 Generalized anxiety disorder: Secondary | ICD-10-CM | POA: Diagnosis not present

## 2018-11-21 MED ORDER — DULOXETINE HCL 60 MG PO CPEP: 60.0000 mg | ORAL_CAPSULE | Freq: Two times a day (BID) | ORAL | 2 refills | Status: AC

## 2018-11-21 MED ORDER — MIRTAZAPINE 30 MG PO TABS: 30.0000 mg | ORAL_TABLET | Freq: Every day | ORAL | 0 refills | Status: AC

## 2018-11-21 NOTE — Progress Notes (Signed)
Virtual Visit via Telephone Note  I connected with Leslie Gamble on 11/21/18 at  1:20 PM EST by telephone and verified that I am speaking with the correct person using two identifiers.   I discussed the limitations, risks, security and privacy concerns of performing an evaluation and management service by telephone and the availability of in person appointments. I also discussed with the patient that there may be a patient responsible charge related to this service. The patient expressed understanding and agreed to proceed.   History of Present Illness: Patient was evaluated by phone session.  She was last seen in May and she apologized missing appointment.  She was recently admitted to the hospital because of pneumonia.  She is feeling better.  Now she is taking Cymbalta regularly and noticed improvement in her mood.  She is also prescribed Xanax by his PCP Dr. Deforest Hoyles.  She sleeps better with Remeron.  She has no tremors, shakes or any EPS.  Her son lives with the patient.  Patient denies any crying spells or any feeling of hopelessness or worthlessness.  She noticed her memory complaint is somewhat better since she is compliant with Cymbalta.  We have recommended therapy but patient is not interested due to Covid despite therapy is virtual.  She also not interested neuroimaging studies which we recommended for forgetfulness.  She feels her memory is better than before.  She like to continue Remeron and Cymbalta.  Past Psychiatric History:Reviewed. H/Oinpatient treatment in1995afterhusband died.No h/osuicidal attemptandpsychosis. Had tried Prozac, Paxil, Zoloft, trazodone, Xanax but do notrecalldetails.Tried lithium butdevelopedtoxicity and requiredhospitalization on the medical floor.  Recent Results (from the past 2160 hour(s))  CBG monitoring, ED     Status: Abnormal   Collection Time: 08/29/18 10:51 AM  Result Value Ref Range   Glucose-Capillary 177 (H) 70 - 99 mg/dL    Comment 1 Notify RN    Comment 2 Document in Chart   Urine culture     Status: None   Collection Time: 08/29/18 11:59 AM   Specimen: Urine, Random  Result Value Ref Range   Specimen Description URINE, RANDOM    Special Requests NONE    Culture      NO GROWTH Performed at [facility], [address], [place], [place] [address]    Report Status 08/30/2018 FINAL   Urinalysis, Routine w reflex microscopic     Status: Abnormal   Collection Time: 08/29/18 12:15 PM  Result Value Ref Range   Color, Urine YELLOW YELLOW   APPearance CLEAR CLEAR   Specific Gravity, Urine 1.008 1.005 - 1.030   pH 6.0 5.0 - 8.0   Glucose, UA NEGATIVE NEGATIVE mg/dL   Hgb urine dipstick SMALL (A) NEGATIVE   Bilirubin Urine NEGATIVE NEGATIVE   Ketones, ur NEGATIVE NEGATIVE mg/dL   Protein, ur 100 (A) NEGATIVE mg/dL   Nitrite NEGATIVE NEGATIVE   Leukocytes,Ua TRACE (A) NEGATIVE   RBC / HPF 0-5 0 - 5 RBC/hpf   WBC, UA 0-5 0 - 5 WBC/hpf   Bacteria, UA NONE SEEN NONE SEEN   Squamous Epithelial / LPF 0-5 0 - 5    Comment: Performed at [facility], [place] [address] [place], [place] [address]  Urine rapid drug screen (hosp performed)     Status: Abnormal   Collection Time: 08/29/18 12:15 PM  Result Value Ref Range   Opiates NONE DETECTED NONE DETECTED   Cocaine NONE DETECTED NONE DETECTED   Benzodiazepines POSITIVE (A) NONE DETECTED  Amphetamines NONE DETECTED NONE DETECTED   Tetrahydrocannabinol NONE DETECTED NONE DETECTED   Barbiturates NONE DETECTED NONE DETECTED    Comment: (NOTE) DRUG SCREEN FOR MEDICAL PURPOSES ONLY.  IF CONFIRMATION IS NEEDED FOR ANY PURPOSE, NOTIFY LAB WITHIN 5 DAYS. LOWEST DETECTABLE LIMITS FOR URINE DRUG SCREEN Drug Class                     Cutoff (ng/mL) Amphetamine and metabolites    1000 Barbiturate and metabolites    200 Benzodiazepine                 532 Tricyclics and metabolites     300 Opiates and metabolites        300 Cocaine and metabolites         300 THC                            50 Performed at [facility], [place] [address], [place], [place] [address]   CBC with Differential     Status: None   Collection Time: 08/29/18 12:30 PM  Result Value Ref Range   WBC 9.9 4.0 - 10.5 K/uL   RBC 4.70 3.87 - 5.11 MIL/uL   Hemoglobin 13.2 12.0 - 15.0 g/dL   HCT 41.5 36.0 - 46.0 %   MCV 88.3 80.0 - 100.0 fL   MCH 28.1 26.0 - 34.0 pg   MCHC 31.8 30.0 - 36.0 g/dL   RDW 14.4 11.5 - 15.5 %   Platelets 305 150 - 400 K/uL   nRBC 0.0 0.0 - 0.2 %   Neutrophils Relative % 69 %   Neutro Abs 6.9 1.7 - 7.7 K/uL   Lymphocytes Relative 23 %   Lymphs Abs 2.3 0.7 - 4.0 K/uL   Monocytes Relative 7 %   Monocytes Absolute 0.7 0.1 - 1.0 K/uL   Eosinophils Relative 0 %   Eosinophils Absolute 0.0 0.0 - 0.5 K/uL   Basophils Relative 1 %   Basophils Absolute 0.1 0.0 - 0.1 K/uL   Immature Granulocytes 0 %   Abs Immature Granulocytes 0.02 0.00 - 0.07 K/uL    Comment: Performed at [facility], [address], [place], [place] [address]  Lactic acid, plasma     Status: Abnormal   Collection Time: 08/29/18 12:57 PM  Result Value Ref Range   Lactic Acid, Venous 2.4 (HH) 0.5 - 1.9 mmol/L    Comment: CRITICAL RESULT CALLED TO, READ BACK BY AND VERIFIED WITH: COFFEY,M RN @ 9622 08/29/18 LEONARD,A Performed at [facility], [place] [address], [place], [place] [address]   CK     Status: Abnormal   Collection Time: 08/29/18  2:30 PM  Result Value Ref Range   Total CK 401 (H) 38 - 234 U/L    Comment: Performed at [facility], [place] [address] [place], [place] [address]  Troponin I (High Sensitivity)     Status: None   Collection Time: 08/29/18  2:30 PM  Result Value Ref Range   Troponin I (High Sensitivity) 13 <18 ng/L    Comment: (NOTE) Elevated high sensitivity troponin I (hsTnI) values and significant  changes across serial measurements may suggest ACS but many other  chronic and acute conditions are known to elevate hsTnI results.   Refer to the Links section for chest pain algorithms and additional  guidance. Performed at [facility], [place] [address]  [place], [place] [address]   Magnesium     Status: Abnormal   Collection Time: 08/29/18  2:30 PM  Result Value Ref Range   Magnesium 1.5 (L) 1.7 - 2.4 mg/dL    Comment: Performed at [facility] [address], [place], [place] [address]  Comprehensive metabolic panel     Status: Abnormal   Collection Time: 08/29/18  2:30 PM  Result Value Ref Range   Sodium 144 135 - 145 mmol/L   Potassium 4.0 3.5 - 5.1 mmol/L    Comment: HEMOLYSIS AT THIS LEVEL MAY AFFECT RESULT   Chloride 107 98 - 111 mmol/L   CO2 25 22 - 32 mmol/L   Glucose, Bld 95 70 - 99 mg/dL   BUN 11 8 - 23 mg/dL   Creatinine, Ser 0.69 0.44 - 1.00 mg/dL   Calcium 8.8 (L) 8.9 - 10.3 mg/dL   Total Protein 7.1 6.5 - 8.1 g/dL   Albumin 3.4 (L) 3.5 - 5.0 g/dL   AST 31 15 - 41 U/L   ALT 15 0 - 44 U/L   Alkaline Phosphatase 82 38 - 126 U/L   Total Bilirubin 1.2 0.3 - 1.2 mg/dL   GFR calc non Af Amer >60 >60 mL/min   GFR calc Af Amer >60 >60 mL/min   Anion gap 12 5 - 15    Comment: Performed at [facility], [place] [address] [place], [place] [address]  Lactic acid, plasma     Status: Abnormal   Collection Time: 08/29/18  5:11 PM  Result Value Ref Range   Lactic Acid, Venous 2.0 (HH) 0.5 - 1.9 mmol/L    Comment: CRITICAL RESULT CALLED TO, READ BACK BY AND VERIFIED WITH: Weber Cooks  6644 08/29/2018 WBOND Performed at [facility], [place] [address] [place], [place] [address]   Troponin I (High Sensitivity)     Status: None   Collection Time: 08/29/18  5:11 PM  Result Value Ref Range   Troponin I (High Sensitivity) 15 <18 ng/L    Comment: (NOTE) Elevated high sensitivity troponin I (hsTnI) values and significant  changes across serial measurements may suggest ACS but many other  chronic and acute conditions are known to elevate hsTnI results.  Refer to the Links section for  chest pain algorithms and additional  guidance. Performed at [facility], [place] [address] [place], [place] [address]   Potassium     Status: Abnormal   Collection Time: 08/29/18  5:11 PM  Result Value Ref Range   Potassium 2.8 (L) 3.5 - 5.1 mmol/L    Comment: Performed at [facility] [facility] [address] [place], [place] [address]  Hemoglobin A1c     Status: Abnormal   Collection Time: 08/29/18  6:58 PM  Result Value Ref Range   Hgb A1c MFr Bld 6.3 (H) 4.8 - 5.6 %    Comment: (NOTE) Pre diabetes:          5.7%-6.4% Diabetes:              >6.4% Glycemic control for   <7.0% adults with diabetes    Mean Plasma Glucose 134.11 mg/dL    Comment: Performed at [facility] [address], [place], [place] [address]  Phosphorus     Status: None   Collection Time: 08/29/18  7:10 PM  Result Value Ref Range   Phosphorus 2.6 2.5 - 4.6 mg/dL    Comment: Performed at [facility],  [address] [place], [place] [address]  SARS Coronavirus 2 [facility] order, Performed in [facility] hospital lab) Nasopharyngeal Nasopharyngeal Swab     Status: None   Collection Time: 08/29/18  7:30 PM   Specimen: Nasopharyngeal Swab  Result Value Ref Range   SARS Coronavirus 2 NEGATIVE NEGATIVE    Comment: (NOTE) If result is NEGATIVE SARS-CoV-2 target nucleic acids are NOT DETECTED. The SARS-CoV-2 RNA is generally detectable in upper and lower  respiratory specimens during the acute phase of infection. The lowest  concentration of SARS-CoV-2 viral copies this assay can detect is 250  copies / mL. A negative result does not preclude SARS-CoV-2 infection  and should not be used as the sole basis for treatment or other  patient management decisions.  A negative result may occur with  improper specimen collection / handling, submission of specimen other  than nasopharyngeal swab, presence of viral mutation(s) within the  areas targeted by this assay, and inadequate number of viral  copies  (<250 copies / mL). A negative result must be combined with clinical  observations, patient history, and epidemiological information. If result is POSITIVE SARS-CoV-2 target nucleic acids are DETECTED. The SARS-CoV-2 RNA is generally detectable in upper and lower  respiratory specimens dur ing the acute phase of infection.  Positive  results are indicative of active infection with SARS-CoV-2.  Clinical  correlation with patient history and other diagnostic information is  necessary to determine patient infection status.  Positive results do  not rule out bacterial infection or co-infection with other viruses. If result is PRESUMPTIVE POSTIVE SARS-CoV-2 nucleic acids MAY BE PRESENT.   A presumptive positive result was obtained on the submitted specimen  and confirmed on repeat testing.  While 2019 novel coronavirus  (SARS-CoV-2) nucleic acids may be present in the submitted sample  additional confirmatory testing may be necessary for epidemiological  and / or clinical management purposes  to differentiate between  SARS-CoV-2 and other Sarbecovirus currently known to infect humans.  If clinically indicated additional testing with an alternate test  methodology [phone]) is advised. The SARS-CoV-2 RNA is generally  detectable in upper and lower respiratory sp ecimens during the acute  phase of infection. The expected result is Negative. Fact Sheet for Patients:  StrictlyIdeas.no Fact Sheet for Healthcare Providers: BankingDealers.co.za This test is not yet approved or cleared by the [place] FDA and has been authorized for detection and/or diagnosis of SARS-CoV-2 by FDA under an Emergency Use Authorization (EUA).  This EUA will remain in effect (meaning this test can be used) for the duration of the COVID-19 declaration under Section 564(b)(1) of the Act, 21 U.S.C. section 360bbb-3(b)(1), unless the authorization is terminated  or revoked sooner. Performed at [facility], [place] [address], [place], [place] [address]   TSH     Status: None   Collection Time: 08/29/18  8:33 PM  Result Value Ref Range   TSH 0.457 0.350 - 4.500 uIU/mL    Comment: Performed by a 3rd Generation assay with a functional sensitivity of <=0.01 uIU/mL. Performed at [facility], [place] [address], [place], [place] [address]   MRSA PCR Screening     Status: None   Collection Time: 08/29/18  9:19 PM   Specimen: Nasal Mucosa; Nasopharyngeal  Result Value Ref Range   MRSA by PCR NEGATIVE NEGATIVE    Comment:        The GeneXpert MRSA Assay (FDA approved for NASAL specimens only), is one component of  a comprehensive MRSA colonization surveillance program. It is not intended to diagnose MRSA infection nor to guide or monitor treatment for MRSA infections. Performed at [facility], [place] [address] [place], [place] [address]   Glucose, capillary     Status: Abnormal   Collection Time: 08/29/18  9:52 PM  Result Value Ref Range   Glucose-Capillary 226 (H) 70 - 99 mg/dL  Basic metabolic panel     Status: Abnormal   Collection Time: 08/30/18  2:56 AM  Result Value Ref Range   Sodium 144 135 - 145 mmol/L   Potassium 3.3 (L) 3.5 - 5.1 mmol/L   Chloride 108 98 - 111 mmol/L   CO2 25 22 - 32 mmol/L   Glucose, Bld 159 (H) 70 - 99 mg/dL   BUN 9 8 - 23 mg/dL   Creatinine, Ser 0.67 0.44 - 1.00 mg/dL   Calcium 8.5 (L) 8.9 - 10.3 mg/dL   GFR calc non Af Amer >60 >60 mL/min   GFR calc Af Amer >60 >60 mL/min   Anion gap 11 5 - 15    Comment: Performed at [facility], [place] [address] [place], [place] [address]  CBC     Status: None   Collection Time: 08/30/18  2:56 AM  Result Value Ref Range   WBC 9.5 4.0 - 10.5 K/uL   RBC 4.43 3.87 - 5.11 MIL/uL   Hemoglobin 12.6 12.0 - 15.0 g/dL   HCT 38.2 36.0 - 46.0 %   MCV 86.2 80.0 - 100.0 fL   MCH 28.4 26.0 - 34.0 pg   MCHC 33.0 30.0 - 36.0 g/dL   RDW 14.2 11.5 - 15.5 %    Platelets 300 150 - 400 K/uL   nRBC 0.0 0.0 - 0.2 %    Comment: Performed at [facility], [place] [address] [place], [place] [address]  Magnesium     Status: None   Collection Time: 08/30/18  2:56 AM  Result Value Ref Range   Magnesium 1.7 1.7 - 2.4 mg/dL    Comment: Performed at [facility] [address], [place], [place] [address]  Glucose, capillary     Status: Abnormal   Collection Time: 08/30/18  7:35 AM  Result Value Ref Range   Glucose-Capillary 112 (H) 70 - 99 mg/dL  Glucose, capillary     Status: Abnormal   Collection Time: 08/30/18 11:33 AM  Result Value Ref Range   Glucose-Capillary 231 (H) 70 - 99 mg/dL  Glucose, capillary     Status: Abnormal   Collection Time: 08/30/18  4:25 PM  Result Value Ref Range   Glucose-Capillary 218 (H) 70 - 99 mg/dL  Glucose, capillary     Status: Abnormal   Collection Time: 08/30/18  9:51 PM  Result Value Ref Range   Glucose-Capillary 183 (H) 70 - 99 mg/dL  Magnesium     Status: None   Collection Time: 08/31/18  3:51 AM  Result Value Ref Range   Magnesium 1.7 1.7 - 2.4 mg/dL    Comment: Performed at [facility], [place] [address], [place], [place] [address]  Basic metabolic panel     Status: Abnormal   Collection Time: 08/31/18  3:51 AM  Result Value Ref Range   Sodium 140 135 - 145 mmol/L   Potassium 3.4 (L) 3.5 - 5.1 mmol/L   Chloride 105 98 - 111 mmol/L   CO2 24 22 - 32 mmol/L   Glucose, Bld 237 (H)  70 - 99 mg/dL   BUN 15 8 - 23 mg/dL   Creatinine, Ser 0.80 0.44 - 1.00 mg/dL   Calcium 8.5 (L) 8.9 - 10.3 mg/dL   GFR calc non Af Amer >60 >60 mL/min   GFR calc Af Amer >60 >60 mL/min   Anion gap 11 5 - 15    Comment: Performed at [facility] [address], [place] [address]  CBC     Status: None   Collection Time: 08/31/18  3:51 AM  Result Value Ref Range   WBC 9.2 4.0 - 10.5 K/uL   RBC 4.28 3.87 - 5.11 MIL/uL   Hemoglobin 12.1 12.0 - 15.0 g/dL   HCT 36.9 36.0 - 46.0 %   MCV 86.2 80.0 -  100.0 fL   MCH 28.3 26.0 - 34.0 pg   MCHC 32.8 30.0 - 36.0 g/dL   RDW 14.3 11.5 - 15.5 %   Platelets 327 150 - 400 K/uL   nRBC 0.0 0.0 - 0.2 %    Comment: Performed at [facility], [place] [address] [place], [place] [address]  Glucose, capillary     Status: Abnormal   Collection Time: 08/31/18  7:40 AM  Result Value Ref Range   Glucose-Capillary 196 (H) 70 - 99 mg/dL  Glucose, capillary     Status: Abnormal   Collection Time: 08/31/18 12:38 PM  Result Value Ref Range   Glucose-Capillary 282 (H) 70 - 99 mg/dL  Glucose, capillary     Status: Abnormal   Collection Time: 08/31/18  4:48 PM  Result Value Ref Range   Glucose-Capillary 217 (H) 70 - 99 mg/dL  Glucose, capillary     Status: Abnormal   Collection Time: 08/31/18  8:41 PM  Result Value Ref Range   Glucose-Capillary 254 (H) 70 - 99 mg/dL  Glucose, capillary     Status: Abnormal   Collection Time: 09/01/18  7:49 AM  Result Value Ref Range   Glucose-Capillary 155 (H) 70 - 99 mg/dL  Glucose, capillary     Status: Abnormal   Collection Time: 09/01/18 11:59 AM  Result Value Ref Range   Glucose-Capillary 189 (H) 70 - 99 mg/dL  Glucose, capillary     Status: Abnormal   Collection Time: 09/01/18  4:57 PM  Result Value Ref Range   Glucose-Capillary 246 (H) 70 - 99 mg/dL  Glucose, capillary     Status: Abnormal   Collection Time: 09/01/18  8:52 PM  Result Value Ref Range   Glucose-Capillary 177 (H) 70 - 99 mg/dL  Glucose, capillary     Status: Abnormal   Collection Time: 09/02/18  7:56 AM  Result Value Ref Range   Glucose-Capillary 175 (H) 70 - 99 mg/dL  Glucose, capillary     Status: Abnormal   Collection Time: 09/02/18  4:21 PM  Result Value Ref Range   Glucose-Capillary 316 (H) 70 - 99 mg/dL  Lipase, blood     Status: None   Collection Time: 09/28/18  7:02 PM  Result Value Ref Range   Lipase 19 11 - 51 U/L    Comment: Performed at [facility], [place] [address], [place], [place] [address]  Comprehensive  metabolic panel     Status: None   Collection Time: 09/28/18  7:02 PM  Result Value Ref Range   Sodium 141 135 - 145 mmol/L   Potassium 3.7 3.5 - 5.1 mmol/L   Chloride 106 98 - 111 mmol/L   CO2 22  22 - 32 mmol/L   Glucose, Bld 97 70 - 99 mg/dL   BUN 11 8 - 23 mg/dL   Creatinine, Ser 0.65 0.44 - 1.00 mg/dL   Calcium 9.2 8.9 - 10.3 mg/dL   Total Protein 7.4 6.5 - 8.1 g/dL   Albumin 3.8 3.5 - 5.0 g/dL   AST 20 15 - 41 U/L   ALT 12 0 - 44 U/L   Alkaline Phosphatase 77 38 - 126 U/L   Total Bilirubin 0.4 0.3 - 1.2 mg/dL   GFR calc non Af Amer >60 >60 mL/min   GFR calc Af Amer >60 >60 mL/min   Anion gap 13 5 - 15    Comment: Performed at [facility], [place] [address] [place], [place] [address]  CBC     Status: None   Collection Time: 09/28/18  7:02 PM  Result Value Ref Range   WBC 9.0 4.0 - 10.5 K/uL   RBC 4.48 3.87 - 5.11 MIL/uL   Hemoglobin 12.7 12.0 - 15.0 g/dL   HCT 39.1 36.0 - 46.0 %   MCV 87.3 80.0 - 100.0 fL   MCH 28.3 26.0 - 34.0 pg   MCHC 32.5 30.0 - 36.0 g/dL   RDW 14.1 11.5 - 15.5 %   Platelets 340 150 - 400 K/uL   nRBC 0.0 0.0 - 0.2 %    Comment: Performed at [facility], [place] [address], [place], [place] [address]  Urinalysis, Routine w reflex microscopic     Status: Abnormal   Collection Time: 09/29/18  2:50 AM  Result Value Ref Range   Color, Urine YELLOW YELLOW   APPearance CLEAR CLEAR   Specific Gravity, Urine 1.015 1.005 - 1.030   pH 6.0 5.0 - 8.0   Glucose, UA NEGATIVE NEGATIVE mg/dL   Hgb urine dipstick NEGATIVE NEGATIVE   Bilirubin Urine NEGATIVE NEGATIVE   Ketones, ur NEGATIVE NEGATIVE mg/dL   Protein, ur 100 (A) NEGATIVE mg/dL   Nitrite NEGATIVE NEGATIVE   Leukocytes,Ua TRACE (A) NEGATIVE   RBC / HPF 0-5 0 - 5 RBC/hpf   WBC, UA 0-5 0 - 5 WBC/hpf   Bacteria, UA NONE SEEN NONE SEEN   Squamous Epithelial / LPF 0-5 0 - 5   Mucus PRESENT     Comment: Performed at [facility], [place] [address] [place],  [address]  CBC with  Differential     Status: Abnormal   Collection Time: 11/06/18  4:53 PM  Result Value Ref Range   WBC 12.0 (H) 4.0 - 10.5 K/uL   RBC 4.80 3.87 - 5.11 MIL/uL   Hemoglobin 13.3 12.0 - 15.0 g/dL   HCT 41.3 36.0 - 46.0 %   MCV 86.0 80.0 - 100.0 fL   MCH 27.7 26.0 - 34.0 pg   MCHC 32.2 30.0 - 36.0 g/dL   RDW 14.6 11.5 - 15.5 %   Platelets 370 150 - 400 K/uL   nRBC 0.0 0.0 - 0.2 %   Neutrophils Relative % 76 %   Neutro Abs 9.2 (H) 1.7 - 7.7 K/uL   Lymphocytes Relative 17 %   Lymphs Abs 2.0 0.7 - 4.0 K/uL   Monocytes Relative 6 %   Monocytes Absolute 0.7 0.1 - 1.0 K/uL   Eosinophils Relative 0 %   Eosinophils Absolute 0.0 0.0 - 0.5 K/uL   Basophils Relative 1 %   Basophils Absolute 0.1 0.0 - 0.1 K/uL   Immature Granulocytes 0 %   Abs Immature  Granulocytes 0.03 0.00 - 0.07 K/uL    Comment: Performed at [facility], [place] [address] [place], [place] [address]  Basic metabolic panel     Status: Abnormal   Collection Time: 11/06/18  4:53 PM  Result Value Ref Range   Sodium 138 135 - 145 mmol/L   Potassium 3.1 (L) 3.5 - 5.1 mmol/L   Chloride 101 98 - 111 mmol/L   CO2 20 (L) 22 - 32 mmol/L   Glucose, Bld 197 (H) 70 - 99 mg/dL   BUN 14 8 - 23 mg/dL   Creatinine, Ser 0.83 0.44 - 1.00 mg/dL   Calcium 9.0 8.9 - 10.3 mg/dL   GFR calc non Af Amer >60 >60 mL/min   GFR calc Af Amer >60 >60 mL/min   Anion gap 17 (H) 5 - 15    Comment: Performed at [facility], [place] [address], [place], [place] [address]  Troponin I (High Sensitivity)     Status: None   Collection Time: 11/06/18  4:53 PM  Result Value Ref Range   Troponin I (High Sensitivity) 13 <18 ng/L    Comment: (NOTE) Elevated high sensitivity troponin I (hsTnI) values and significant  changes across serial measurements may suggest ACS but many other  chronic and acute conditions are known to elevate hsTnI results.  Refer to the "Links" section for chest pain algorithms and additional  guidance. Performed at [facility], [place] [address] [place], [place] [address]   Troponin I (High Sensitivity)     Status: Abnormal   Collection Time: 11/06/18  6:50 PM  Result Value Ref Range   Troponin I (High Sensitivity) 75 (H) <18 ng/L    Comment: RESULT CALLED TO, READ BACK BY AND VERIFIED WITH: C.BAIN,RN @ 1959 11/06/2018 WEBBERJ (NOTE) Elevated high sensitivity troponin I (hsTnI) values and significant  changes across serial measurements may suggest ACS but many other  chronic and acute conditions are known to elevate hsTnI results.  Refer to the Links section for chest pain algorithms and additional  guidance. Performed at [facility], [place] [address] [place], [place] [address]   CBG monitoring, ED     Status: Abnormal   Collection Time: 11/06/18  9:55 PM  Result Value Ref Range   Glucose-Capillary 138 (H) 70 - 99 mg/dL  Influenza panel by PCR (type A & B)     Status: None   Collection Time: 11/06/18 10:42 PM  Result Value Ref Range   Influenza A By PCR NEGATIVE NEGATIVE   Influenza B By PCR NEGATIVE NEGATIVE    Comment: (NOTE) The Xpert Xpress Flu assay is intended as an aid in the diagnosis of  influenza and should not be used as a sole basis for treatment.  This  assay is FDA approved for nasopharyngeal swab specimens only. Nasal  washings and aspirates are unacceptable for Xpert Xpress Flu testing. Performed at [facility], [place] [address], [place], [place] [address]   Respiratory Panel by PCR     Status: None   Collection Time: 11/06/18 10:42 PM   Specimen: Nasopharyngeal Swab; Respiratory  Result Value Ref Range   Adenovirus NOT DETECTED NOT DETECTED   Coronavirus 229E NOT DETECTED NOT DETECTED    Comment: (NOTE) The Coronavirus on the Respiratory Panel, DOES NOT test for the novel  Coronavirus (2019 nCoV)    Coronavirus HKU1 NOT DETECTED NOT DETECTED   Coronavirus NL63 NOT DETECTED NOT DETECTED   Coronavirus  OC43 NOT DETECTED NOT DETECTED   Metapneumovirus NOT  DETECTED NOT DETECTED   Rhinovirus / Enterovirus NOT DETECTED NOT DETECTED   Influenza A NOT DETECTED NOT DETECTED   Influenza B NOT DETECTED NOT DETECTED   Parainfluenza Virus 1 NOT DETECTED NOT DETECTED   Parainfluenza Virus 2 NOT DETECTED NOT DETECTED   Parainfluenza Virus 3 NOT DETECTED NOT DETECTED   Parainfluenza Virus 4 NOT DETECTED NOT DETECTED   Respiratory Syncytial Virus NOT DETECTED NOT DETECTED   Bordetella pertussis NOT DETECTED NOT DETECTED   Chlamydophila pneumoniae NOT DETECTED NOT DETECTED   Mycoplasma pneumoniae NOT DETECTED NOT DETECTED    Comment: Performed at [facility], [place] [address] [place], [place] [address]  SARS Coronavirus 2 by RT PCR (hospital order, performed in [facility] hospital lab) Nasopharyngeal Nasopharyngeal Swab     Status: None   Collection Time: 11/06/18 11:05 PM   Specimen: Nasopharyngeal Swab  Result Value Ref Range   SARS Coronavirus 2 NEGATIVE NEGATIVE    Comment: (NOTE) If result is NEGATIVE SARS-CoV-2 target nucleic acids are NOT DETECTED. The SARS-CoV-2 RNA is generally detectable in upper and lower  respiratory specimens during the acute phase of infection. The lowest  concentration of SARS-CoV-2 viral copies this assay can detect is 250  copies / mL. A negative result does not preclude SARS-CoV-2 infection  and should not be used as the sole basis for treatment or other  patient management decisions.  A negative result may occur with  improper specimen collection / handling, submission of specimen other  than nasopharyngeal swab, presence of viral mutation(s) within the  areas targeted by this assay, and inadequate number of viral copies  (<250 copies / mL). A negative result must be combined with clinical  observations, patient history, and epidemiological information. If result is POSITIVE SARS-CoV-2 target nucleic acids are DETECTED. The SARS-CoV-2 RNA is generally detectable in upper and lower  respiratory specimens  dur ing the acute phase of infection.  Positive  results are indicative of active infection with SARS-CoV-2.  Clinical  correlation with patient history and other diagnostic information is  necessary to determine patient infection status.  Positive results do  not rule out bacterial infection or co-infection with other viruses. If result is PRESUMPTIVE POSTIVE SARS-CoV-2 nucleic acids MAY BE PRESENT.   A presumptive positive result was obtained on the submitted specimen  and confirmed on repeat testing.  While 2019 novel coronavirus  (SARS-CoV-2) nucleic acids may be present in the submitted sample  additional confirmatory testing may be necessary for epidemiological  and / or clinical management purposes  to differentiate between  SARS-CoV-2 and other Sarbecovirus currently known to infect humans.  If clinically indicated additional testing with an alternate test  methodology [id number]) is advised. The SARS-CoV-2 RNA is generally  detectable in upper and lower respiratory sp ecimens during the acute  phase of infection. The expected result is Negative. Fact Sheet for Patients:  StrictlyIdeas.no Fact Sheet for Healthcare Providers: BankingDealers.co.za This test is not yet approved or cleared by the [place] FDA and has been authorized for detection and/or diagnosis of SARS-CoV-2 by FDA under an Emergency Use Authorization (EUA).  This EUA will remain in effect (meaning this test can be used) for the duration of the COVID-19 declaration under Section 564(b)(1) of the Act, 21 U.S.C. section 360bbb-3(b)(1), unless the authorization is terminated or revoked sooner. Performed at [facility], [place] [address] [place], [place] [address]   Urinalysis, Routine  w reflex microscopic     Status: Abnormal   Collection Time: 11/07/18 12:01 AM  Result Value Ref Range   Color, Urine YELLOW YELLOW   APPearance HAZY (A) CLEAR   Specific  Gravity, Urine 1.019 1.005 - 1.030   pH 6.0 5.0 - 8.0   Glucose, UA 50 (A) NEGATIVE mg/dL   Hgb urine dipstick NEGATIVE NEGATIVE   Bilirubin Urine NEGATIVE NEGATIVE   Ketones, ur NEGATIVE NEGATIVE mg/dL   Protein, ur >=300 (A) NEGATIVE mg/dL   Nitrite NEGATIVE NEGATIVE   Leukocytes,Ua NEGATIVE NEGATIVE   RBC / HPF 0-5 0 - 5 RBC/hpf   WBC, UA 11-20 0 - 5 WBC/hpf   Bacteria, UA RARE (A) NONE SEEN    Comment: Performed at [facility], [address] [place], [place] [address]  Legionella Pneumophila Serogp 1 Ur Ag     Status: None   Collection Time: 11/07/18 12:01 AM  Result Value Ref Range   L. pneumophila Serogp 1 Ur Ag Negative Negative    Comment: (NOTE) Presumptive negative for L. pneumophila serogroup 1 antigen in urine, suggesting no recent or current infection. Legionnaires' disease cannot be ruled out since other serogroups and species may also cause disease. Performed At: [facility] [place], [place] [id number] Rush Farmer MD VQ:[id number]    Source of Sample URINE, RANDOM     Comment: Performed at [facility], [place] [address], [place], [place] [address]  Strep pneumoniae urinary antigen     Status: None   Collection Time: 11/07/18 12:01 AM  Result Value Ref Range   Strep Pneumo Urinary Antigen NEGATIVE NEGATIVE    Comment:        Infection due to S. pneumoniae cannot be absolutely ruled out since the antigen present may be below the detection limit of the test. Performed at [facility], [address], [place], [place] [address]   HIV Antibody (routine testing w rflx)     Status: None   Collection Time: 11/07/18  1:00 AM  Result Value Ref Range   HIV Screen 4th Generation wRfx NON REACTIVE NON REACTIVE    Comment: Performed at [facility] [address], [place], [place] [address]  Culture, blood (Routine X 2) w Reflex to ID Panel     Status: None   Collection Time: 11/07/18  1:00 AM   Specimen: BLOOD RIGHT  FOREARM  Result Value Ref Range   Specimen Description BLOOD RIGHT FOREARM    Special Requests      BOTTLES DRAWN AEROBIC AND ANAEROBIC Blood Culture adequate volume   Culture      NO GROWTH 5 DAYS Performed at [facility], [place] [address], [place], [place] [address]    Report Status 11/12/2018 FINAL   Culture, blood (Routine X 2) w Reflex to ID Panel     Status: None   Collection Time: 11/07/18  1:08 AM   Specimen: BLOOD LEFT HAND  Result Value Ref Range   Specimen Description BLOOD LEFT HAND    Special Requests      BOTTLES DRAWN AEROBIC AND ANAEROBIC Blood Culture adequate volume   Culture      NO GROWTH 5 DAYS Performed at [facility], [place] [address] [place], [place] [address]    Report Status 11/12/2018 FINAL   Magnesium     Status: Abnormal   Collection Time: 11/07/18  5:00 AM  Result Value Ref Range   Magnesium  1.2 (L) 1.7 - 2.4 mg/dL    Comment: Performed at [facility], [place] [address] [place], [place] [address]  CBG monitoring, ED     Status: Abnormal   Collection Time: 11/07/18  8:07 AM  Result Value Ref Range   Glucose-Capillary 277 (H) 70 - 99 mg/dL  Renal function panel     Status: Abnormal   Collection Time: 11/07/18 10:18 AM  Result Value Ref Range   Sodium 137 135 - 145 mmol/L   Potassium 3.8 3.5 - 5.1 mmol/L   Chloride 103 98 - 111 mmol/L   CO2 21 (L) 22 - 32 mmol/L   Glucose, Bld 308 (H) 70 - 99 mg/dL   BUN 16 8 - 23 mg/dL   Creatinine, Ser 0.67 0.44 - 1.00 mg/dL   Calcium 8.7 (L) 8.9 - 10.3 mg/dL   Phosphorus 2.1 (L) 2.5 - 4.6 mg/dL   Albumin 3.2 (L) 3.5 - 5.0 g/dL   GFR calc non Af Amer >60 >60 mL/min   GFR calc Af Amer >60 >60 mL/min   Anion gap 13 5 - 15    Comment: Performed at [facility], [place] [address], [place], [place] [address]  CBC with Differential/Platelet     Status: Abnormal   Collection Time: 11/07/18 10:18 AM  Result Value Ref Range   WBC 5.4 4.0 - 10.5 K/uL   RBC 4.61 3.87 - 5.11 MIL/uL   Hemoglobin  13.2 12.0 - 15.0 g/dL   HCT 39.2 36.0 - 46.0 %   MCV 85.0 80.0 - 100.0 fL   MCH 28.6 26.0 - 34.0 pg   MCHC 33.7 30.0 - 36.0 g/dL   RDW 14.3 11.5 - 15.5 %   Platelets 364 150 - 400 K/uL   nRBC 0.0 0.0 - 0.2 %   Neutrophils Relative % 87 %   Neutro Abs 4.7 1.7 - 7.7 K/uL   Lymphocytes Relative 12 %   Lymphs Abs 0.6 (L) 0.7 - 4.0 K/uL   Monocytes Relative 1 %   Monocytes Absolute 0.0 (L) 0.1 - 1.0 K/uL   Eosinophils Relative 0 %   Eosinophils Absolute 0.0 0.0 - 0.5 K/uL   Basophils Relative 0 %   Basophils Absolute 0.0 0.0 - 0.1 K/uL   Immature Granulocytes 0 %   Abs Immature Granulocytes 0.01 0.00 - 0.07 K/uL    Comment: Performed at [facility] [address], [place], [place] [address]  CBG monitoring, ED     Status: Abnormal   Collection Time: 11/07/18 12:32 PM  Result Value Ref Range   Glucose-Capillary 264 (H) 70 - 99 mg/dL  Glucose, capillary     Status: Abnormal   Collection Time: 11/07/18  4:00 PM  Result Value Ref Range   Glucose-Capillary 326 (H) 70 - 99 mg/dL  Glucose, capillary     Status: Abnormal   Collection Time: 11/07/18  9:27 PM  Result Value Ref Range   Glucose-Capillary 331 (H) 70 - 99 mg/dL  CBC with Differential/Platelet     Status: Abnormal   Collection Time: 11/08/18  6:07 AM  Result Value Ref Range   WBC 10.8 (H) 4.0 - 10.5 K/uL   RBC 4.50 3.87 - 5.11 MIL/uL   Hemoglobin 12.5 12.0 - 15.0 g/dL   HCT 38.3 36.0 - 46.0 %   MCV 85.1 80.0 - 100.0 fL   MCH 27.8 26.0 - 34.0 pg   MCHC 32.6 30.0 - 36.0 g/dL   RDW 14.4 11.5 -  15.5 %   Platelets 412 (H) 150 - 400 K/uL   nRBC 0.0 0.0 - 0.2 %   Neutrophils Relative % 90 %   Neutro Abs 9.7 (H) 1.7 - 7.7 K/uL   Lymphocytes Relative 8 %   Lymphs Abs 0.8 0.7 - 4.0 K/uL   Monocytes Relative 2 %   Monocytes Absolute 0.2 0.1 - 1.0 K/uL   Eosinophils Relative 0 %   Eosinophils Absolute 0.0 0.0 - 0.5 K/uL   Basophils Relative 0 %   Basophils Absolute 0.0 0.0 - 0.1 K/uL   Immature Granulocytes 0 %   Abs  Immature Granulocytes 0.04 0.00 - 0.07 K/uL    Comment: Performed at [facility] [address] [place], [place] [address]  Magnesium     Status: Abnormal   Collection Time: 11/08/18  6:07 AM  Result Value Ref Range   Magnesium 1.6 (L) 1.7 - 2.4 mg/dL    Comment: Performed at [facility] [address] [place], [place] [address]  Renal function panel     Status: Abnormal   Collection Time: 11/08/18  6:07 AM  Result Value Ref Range   Sodium 139 135 - 145 mmol/L   Potassium 3.7 3.5 - 5.1 mmol/L   Chloride 103 98 - 111 mmol/L   CO2 23 22 - 32 mmol/L   Glucose, Bld 271 (H) 70 - 99 mg/dL   BUN 18 8 - 23 mg/dL   Creatinine, Ser 0.71 0.44 - 1.00 mg/dL   Calcium 8.6 (L) 8.9 - 10.3 mg/dL   Phosphorus 3.3 2.5 - 4.6 mg/dL   Albumin 3.2 (L) 3.5 - 5.0 g/dL   GFR calc non Af Amer >60 >60 mL/min   GFR calc Af Amer >60 >60 mL/min   Anion gap 13 5 - 15    Comment: Performed at [facility] [address] [place], [place] [address]  Glucose, capillary     Status: Abnormal   Collection Time: 11/08/18  7:49 AM  Result Value Ref Range   Glucose-Capillary 238 (H) 70 - 99 mg/dL  Glucose, capillary     Status: Abnormal   Collection Time: 11/08/18 11:26 AM  Result Value Ref Range   Glucose-Capillary 415 (H) 70 - 99 mg/dL  Glucose, capillary     Status: Abnormal   Collection Time: 11/08/18  4:39 PM  Result Value Ref Range   Glucose-Capillary 217 (H) 70 - 99 mg/dL  Glucose, capillary     Status: Abnormal   Collection Time: 11/08/18  8:49 PM  Result Value Ref Range   Glucose-Capillary 215 (H) 70 - 99 mg/dL  CBC with Differential/Platelet     Status: Abnormal   Collection Time: 11/09/18  5:01 AM  Result Value Ref Range   WBC 15.7 (H) 4.0 - 10.5 K/uL   RBC 4.63 3.87 - 5.11 MIL/uL   Hemoglobin 12.9 12.0 - 15.0 g/dL   HCT 39.2 36.0 - 46.0 %   MCV 84.7 80.0 - 100.0 fL   MCH 27.9 26.0 - 34.0 pg   MCHC 32.9 30.0 - 36.0 g/dL   RDW 14.6 11.5 - 15.5 %   Platelets 412 (H) 150 -  400 K/uL   nRBC 0.0 0.0 - 0.2 %   Neutrophils Relative % 82 %   Neutro Abs 12.8 (H) 1.7 - 7.7 K/uL   Lymphocytes Relative 12 %   Lymphs Abs 2.0 0.7 - 4.0 K/uL   Monocytes Relative 6 %   Monocytes Absolute  0.9 0.1 - 1.0 K/uL   Eosinophils Relative 0 %   Eosinophils Absolute 0.0 0.0 - 0.5 K/uL   Basophils Relative 0 %   Basophils Absolute 0.0 0.0 - 0.1 K/uL   Immature Granulocytes 0 %   Abs Immature Granulocytes 0.07 0.00 - 0.07 K/uL    Comment: Performed at [facility], [place] [address], [place], [place] [address]  Magnesium     Status: None   Collection Time: 11/09/18  5:01 AM  Result Value Ref Range   Magnesium 1.7 1.7 - 2.4 mg/dL    Comment: Performed at [facility] [address], [place], [place] [address]  Renal function panel     Status: Abnormal   Collection Time: 11/09/18  5:01 AM  Result Value Ref Range   Sodium 141 135 - 145 mmol/L   Potassium 3.4 (L) 3.5 - 5.1 mmol/L   Chloride 106 98 - 111 mmol/L   CO2 24 22 - 32 mmol/L   Glucose, Bld 155 (H) 70 - 99 mg/dL   BUN 19 8 - 23 mg/dL   Creatinine, Ser 0.68 0.44 - 1.00 mg/dL   Calcium 8.5 (L) 8.9 - 10.3 mg/dL   Phosphorus 1.9 (L) 2.5 - 4.6 mg/dL   Albumin 3.0 (L) 3.5 - 5.0 g/dL   GFR calc non Af Amer >60 >60 mL/min   GFR calc Af Amer >60 >60 mL/min   Anion gap 11 5 - 15    Comment: Performed at [facility] [address] [place], [place] [address]  Glucose, capillary     Status: Abnormal   Collection Time: 11/09/18  6:55 AM  Result Value Ref Range   Glucose-Capillary 136 (H) 70 - 99 mg/dL  Glucose, capillary     Status: Abnormal   Collection Time: 11/09/18 11:25 AM  Result Value Ref Range   Glucose-Capillary 190 (H) 70 - 99 mg/dL  Glucose, capillary     Status: Abnormal   Collection Time: 11/09/18  4:30 PM  Result Value Ref Range   Glucose-Capillary 238 (H) 70 - 99 mg/dL  Glucose, capillary     Status: Abnormal   Collection Time: 11/09/18  9:52 PM  Result Value Ref Range    Glucose-Capillary 282 (H) 70 - 99 mg/dL  Lactate dehydrogenase     Status: None   Collection Time: 11/10/18  4:04 AM  Result Value Ref Range   LDH 170 98 - 192 U/L    Comment: Performed at [facility], [place] [address], [place],  [address]  Comprehensive metabolic panel     Status: Abnormal   Collection Time: 11/10/18  4:04 AM  Result Value Ref Range   Sodium 138 135 - 145 mmol/L   Potassium 3.2 (L) 3.5 - 5.1 mmol/L   Chloride 103 98 - 111 mmol/L   CO2 23 22 - 32 mmol/L   Glucose, Bld 268 (H) 70 - 99 mg/dL   BUN 19 8 - 23 mg/dL   Creatinine, Ser 0.71 0.44 - 1.00 mg/dL   Calcium 8.3 (L) 8.9 - 10.3 mg/dL   Total Protein 6.6 6.5 - 8.1 g/dL   Albumin 3.2 (L) 3.5 - 5.0 g/dL   AST 19 15 - 41 U/L   ALT 18 0 - 44 U/L   Alkaline Phosphatase 71 38 - 126 U/L   Total Bilirubin 0.2 (L) 0.3 - 1.2 mg/dL   GFR calc non Af Amer >60 >60 mL/min   GFR calc Af Amer >  60 >60 mL/min   Anion gap 12 5 - 15    Comment: Performed at [facility] [address], [place], [place] [address]  CBC     Status: Abnormal   Collection Time: 11/10/18  4:04 AM  Result Value Ref Range   WBC 11.9 (H) 4.0 - 10.5 K/uL   RBC 4.74 3.87 - 5.11 MIL/uL   Hemoglobin 13.1 12.0 - 15.0 g/dL   HCT 39.6 36.0 - 46.0 %   MCV 83.5 80.0 - 100.0 fL   MCH 27.6 26.0 - 34.0 pg   MCHC 33.1 30.0 - 36.0 g/dL   RDW 14.3 11.5 - 15.5 %   Platelets 436 (H) 150 - 400 K/uL   nRBC 0.0 0.0 - 0.2 %    Comment: Performed at [facility], [place] [address] [place], [place] [address]  Glucose, capillary     Status: Abnormal   Collection Time: 11/10/18  6:55 AM  Result Value Ref Range   Glucose-Capillary 200 (H) 70 - 99 mg/dL  Glucose, capillary     Status: Abnormal   Collection Time: 11/10/18 11:45 AM  Result Value Ref Range   Glucose-Capillary 245 (H) 70 - 99 mg/dL  Lactate dehydrogenase (pleural or peritoneal fluid)     Status: Abnormal   Collection Time: 11/10/18  3:43 PM  Result Value Ref Range   LD, Fluid 286 (H)  3 - 23 U/L    Comment: (NOTE) Results should be evaluated in conjunction with serum values    Fluid Type-FLDH PLEURAL RIGHT     Comment: Performed at [facility], [place] [address], [place], [place] [address] CORRECTED ON 10/22 AT 1603: PREVIOUSLY REPORTED AS Lung, Right   Body fluid cell count with differential     Status: Abnormal   Collection Time: 11/10/18  3:43 PM  Result Value Ref Range   Fluid Type-FCT PLEURAL RIGHT     Comment: CORRECTED ON 10/22 AT 1603: PREVIOUSLY REPORTED AS Lung, Right   Color, Fluid ORANGE (A) YELLOW   Appearance, Fluid CLOUDY (A) CLEAR   Total Nucleated Cell Count, Fluid 2,590 (H) 0 - 1,000 cu mm   Neutrophil Count, Fluid 0 0 - 25 %   Lymphs, Fluid 5 %   Monocyte-Macrophage-Serous Fluid 95 (H) 50 - 90 %   Eos, Fluid 0 %    Comment: Performed at [facility], [place] [address] [place], [place] [address]  Protein, pleural or peritoneal fluid     Status: None   Collection Time: 11/10/18  3:43 PM  Result Value Ref Range   Total protein, fluid 4.9 g/dL    Comment: (NOTE) No normal range established for this test Results should be evaluated in conjunction with serum values    Fluid Type-FTP PLEURAL RIGHT     Comment: Performed at [facility] [address] [place], [place] [address] CORRECTED ON 10/22 AT 8101: PREVIOUSLY REPORTED AS Lung, Right   Culture, body fluid-bottle     Status: None   Collection Time: 11/10/18  3:43 PM   Specimen: Fluid  Result Value Ref Range   Specimen Description FLUID PLEURAL RIGHT    Special Requests BOTTLES DRAWN AEROBIC AND ANAEROBIC    Culture      NO GROWTH 5 DAYS Performed at [facility], [place] [address], [place], [place] [address]    Report Status 11/15/2018 FINAL   Gram stain     Status: None   Collection Time:  11/10/18  3:43 PM   Specimen: Fluid  Result Value Ref Range   Specimen Description FLUID PLEURAL RIGHT    Special Requests NONE    Gram Stain      WBC PRESENT, PREDOMINANTLY  MONONUCLEAR NO ORGANISMS SEEN CYTOSPIN SMEAR Performed at [facility], [address], [place], [place] [address]    Report Status 11/10/2018 FINAL   Cytology - Non PAP;     Status: None   Collection Time: 11/10/18  3:43 PM  Result Value Ref Range   CYTOLOGY - NON GYN      CYTOLOGY - NON PAP CASE: [id number] PATIENT: [place] Non-Gynecological Cytology Report     Clinical History: Specimen Submitted:  A. PLEURAL FLUID, RIGHT, THORACENTESIS:   FINAL MICROSCOPIC DIAGNOSIS: - Atypical cells present See comment.  SPECIMEN ADEQUACY: Satisfactory for evaluation  DIAGNOSTIC COMMENTS: The atypical cells are positive with cytokeratin 7 and are negative with TTF-1-1, Napsin A, cytokeratin 5/6, calretinin, WT1, estrogen receptor, progesterone receptor, GATA 3, GCDFP and cytokeratin 20.  The morphologic features favor an atypical mesothelial proliferation including a reactive process.  The immunophenotype is nonspecific.  GROSS: Received is/are 1000 cc's of brown fluid. (MW:mw) Smears: 0 Concentration Method (ThinPrep):1 Cell Block: 1 Conventional Additional Studies: 2 Hematology slides labeled [id number]     Final Diagnosis performed by Claudette Laws, MD.   Electronically signed 11/15/2018 Technical and / or Professional comp onents performed at [facility]. [facility], [place] [address] [place], [place] [address].  Immunohistochemistry Technical component (if applicable) was performed at [facility] [address], [place], [place], [place] [address].   IMMUNOHISTOCHEMISTRY DISCLAIMER (if applicable): Some of these immunohistochemical stains may have been developed and the performance characteristics determine by [facility]. Some may not have been cleared or approved by the U.S. Food and Drug Administration. The FDA has determined that such clearance or approval is not necessary. This test is used for clinical purposes. It  should not be regarded as investigational or for research. This laboratory is certified under the [place] (CLIA-88) as qualified to perform high complexity clinical laboratory testing.  The controls stained appropriately.   Glucose, capillary     Status: Abnormal   Collection Time: 11/10/18  4:30 PM  Result Value Ref Range   Glucose-Capillary 180 (H) 70 - 99 mg/dL  Glucose, capillary     Status: Abnormal   Collection Time: 11/10/18  9:37 PM  Result Value Ref Range   Glucose-Capillary 209 (H) 70 - 99 mg/dL  Glucose, capillary     Status: Abnormal   Collection Time: 11/11/18  6:56 AM  Result Value Ref Range   Glucose-Capillary 112 (H) 70 - 99 mg/dL  Glucose, capillary     Status: Abnormal   Collection Time: 11/11/18 11:50 AM  Result Value Ref Range   Glucose-Capillary 193 (H) 70 - 99 mg/dL      Psychiatric Specialty Exam: Physical Exam  ROS  There were no vitals taken for this visit.There is no height or weight on file to calculate BMI.  General Appearance: NA  Eye Contact:  NA  Speech:  Clear and Coherent and Slow  Volume:  Normal  Mood:  Euthymic  Affect:  NA  Thought Process:  Goal Directed  Orientation:  Full (Time, Place, and Person)  Thought Content:  Logical  Suicidal Thoughts:  No  Homicidal Thoughts:  No  Memory:  Immediate;   Good Recent;   Fair Remote;  Fair  Judgement:  Fair  Insight:  Present  Psychomotor Activity:  NA  Concentration:  Concentration: Fair and Attention Span: Fair  Recall:  [employer] of Knowledge:  Fair  Language:  Good  Akathisia:  No  Handed:  Right  AIMS (if indicated):     Assets:  Communication Skills Desire for Improvement Housing Social Support  ADL's:  Intact  Cognition:  Impaired,  Mild  Sleep:   ok      Assessment and Plan: Major depressive disorder, recurrent.  Generalized anxiety disorder.  Mild cognitive impairment.  Patient recently admitted for pneumonia and now  she is getting better.  I reviewed blood work results.  Her chemistry is normal.  Since taking Cymbalta on time she is doing better.  She noticed that her memory is better.  She has no more crying spells.  Recommended to continue Remeron 30 mg at bedtime and Cymbalta 60 mg twice a day.  She is also getting Xanax 0.25 mg prescribed by PCP Dr. Deforest Hoyles.  Discussed medication side effects and benefits.  She is not interested in therapy.  Recommended to call us back if she is any question or any concern.  Follow-up in 3 months.  Follow Up Instructions:    I discussed the assessment and treatment plan with the patient. The patient was provided an opportunity to ask questions and all were answered. The patient agreed with the plan and demonstrated an understanding of the instructions.   The patient was advised to call back or seek an in-person evaluation if the symptoms worsen or if the condition fails to improve as anticipated.  I provided 20 minutes of non-face-to-face time during this encounter.   Kathlee Nations, MD

## 2018-11-28 ENCOUNTER — Inpatient Hospital Stay (HOSPITAL_COMMUNITY)
Admission: EM | Admit: 2018-11-28 | Discharge: 2018-12-01 | DRG: 843 | Disposition: A | Discharge status: Home / Self Care | Payer: Medicare Other | Attending: Family Medicine | Admitting: Family Medicine

## 2018-11-28 ENCOUNTER — Ambulatory Visit
Admission: RE | Admit: 2018-11-28 | Discharge: 2018-11-28 | Disposition: A | Discharge status: Home / Self Care | Payer: Medicare Other | Source: Ambulatory Visit | Attending: Internal Medicine | Admitting: Internal Medicine

## 2018-11-28 ENCOUNTER — Encounter (HOSPITAL_COMMUNITY): Payer: Self-pay

## 2018-11-28 ENCOUNTER — Other Ambulatory Visit: Payer: Self-pay | Admitting: Internal Medicine

## 2018-11-28 DIAGNOSIS — Z833 Family history of diabetes mellitus: Secondary | ICD-10-CM

## 2018-11-28 DIAGNOSIS — J690 Pneumonitis due to inhalation of food and vomit: Secondary | ICD-10-CM

## 2018-11-28 DIAGNOSIS — Z880 Allergy status to penicillin: Secondary | ICD-10-CM

## 2018-11-28 DIAGNOSIS — J9621 Acute and chronic respiratory failure with hypoxia: Secondary | ICD-10-CM | POA: Diagnosis not present

## 2018-11-28 DIAGNOSIS — K5900 Constipation, unspecified: Secondary | ICD-10-CM | POA: Diagnosis not present

## 2018-11-28 DIAGNOSIS — F32A Depression, unspecified: Secondary | ICD-10-CM | POA: Diagnosis present

## 2018-11-28 DIAGNOSIS — Z885 Allergy status to narcotic agent status: Secondary | ICD-10-CM

## 2018-11-28 DIAGNOSIS — R918 Other nonspecific abnormal finding of lung field: Secondary | ICD-10-CM

## 2018-11-28 DIAGNOSIS — Z79899 Other long term (current) drug therapy: Secondary | ICD-10-CM

## 2018-11-28 DIAGNOSIS — I10 Essential (primary) hypertension: Secondary | ICD-10-CM | POA: Diagnosis present

## 2018-11-28 DIAGNOSIS — Z888 Allergy status to other drugs, medicaments and biological substances status: Secondary | ICD-10-CM

## 2018-11-28 DIAGNOSIS — Z20828 Contact with and (suspected) exposure to other viral communicable diseases: Secondary | ICD-10-CM | POA: Diagnosis present

## 2018-11-28 DIAGNOSIS — F329 Major depressive disorder, single episode, unspecified: Secondary | ICD-10-CM | POA: Diagnosis present

## 2018-11-28 DIAGNOSIS — Z91013 Allergy to seafood: Secondary | ICD-10-CM | POA: Diagnosis not present

## 2018-11-28 DIAGNOSIS — Z794 Long term (current) use of insulin: Secondary | ICD-10-CM

## 2018-11-28 DIAGNOSIS — Z882 Allergy status to sulfonamides status: Secondary | ICD-10-CM

## 2018-11-28 DIAGNOSIS — J910 Malignant pleural effusion: Secondary | ICD-10-CM | POA: Diagnosis present

## 2018-11-28 DIAGNOSIS — Z9889 Other specified postprocedural states: Secondary | ICD-10-CM

## 2018-11-28 DIAGNOSIS — J90 Pleural effusion, not elsewhere classified: Secondary | ICD-10-CM | POA: Diagnosis not present

## 2018-11-28 DIAGNOSIS — R140 Abdominal distension (gaseous): Secondary | ICD-10-CM

## 2018-11-28 DIAGNOSIS — Z87891 Personal history of nicotine dependence: Secondary | ICD-10-CM | POA: Diagnosis not present

## 2018-11-28 DIAGNOSIS — Z881 Allergy status to other antibiotic agents status: Secondary | ICD-10-CM | POA: Diagnosis not present

## 2018-11-28 DIAGNOSIS — R062 Wheezing: Secondary | ICD-10-CM | POA: Diagnosis not present

## 2018-11-28 DIAGNOSIS — R0602 Shortness of breath: Secondary | ICD-10-CM

## 2018-11-28 DIAGNOSIS — F419 Anxiety disorder, unspecified: Secondary | ICD-10-CM | POA: Diagnosis present

## 2018-11-28 DIAGNOSIS — Z79891 Long term (current) use of opiate analgesic: Secondary | ICD-10-CM

## 2018-11-28 DIAGNOSIS — R0600 Dyspnea, unspecified: Secondary | ICD-10-CM

## 2018-11-28 DIAGNOSIS — J918 Pleural effusion in other conditions classified elsewhere: Secondary | ICD-10-CM | POA: Diagnosis not present

## 2018-11-28 DIAGNOSIS — J189 Pneumonia, unspecified organism: Secondary | ICD-10-CM | POA: Diagnosis not present

## 2018-11-28 DIAGNOSIS — J80 Acute respiratory distress syndrome: Secondary | ICD-10-CM | POA: Diagnosis not present

## 2018-11-28 DIAGNOSIS — R1915 Other abnormal bowel sounds: Secondary | ICD-10-CM

## 2018-11-28 DIAGNOSIS — E876 Hypokalemia: Secondary | ICD-10-CM | POA: Diagnosis present

## 2018-11-28 DIAGNOSIS — J9601 Acute respiratory failure with hypoxia: Secondary | ICD-10-CM | POA: Diagnosis present

## 2018-11-28 DIAGNOSIS — Z8249 Family history of ischemic heart disease and other diseases of the circulatory system: Secondary | ICD-10-CM | POA: Diagnosis not present

## 2018-11-28 DIAGNOSIS — E119 Type 2 diabetes mellitus without complications: Secondary | ICD-10-CM

## 2018-11-28 DIAGNOSIS — M069 Rheumatoid arthritis, unspecified: Secondary | ICD-10-CM | POA: Diagnosis present

## 2018-11-28 DIAGNOSIS — Z7951 Long term (current) use of inhaled steroids: Secondary | ICD-10-CM

## 2018-11-28 DIAGNOSIS — E1165 Type 2 diabetes mellitus with hyperglycemia: Secondary | ICD-10-CM | POA: Diagnosis not present

## 2018-11-28 DIAGNOSIS — E872 Acidosis: Secondary | ICD-10-CM | POA: Diagnosis present

## 2018-11-28 DIAGNOSIS — C801 Malignant (primary) neoplasm, unspecified: Principal | ICD-10-CM | POA: Diagnosis present

## 2018-11-28 DIAGNOSIS — K219 Gastro-esophageal reflux disease without esophagitis: Secondary | ICD-10-CM | POA: Diagnosis present

## 2018-11-28 DIAGNOSIS — E785 Hyperlipidemia, unspecified: Secondary | ICD-10-CM | POA: Diagnosis not present

## 2018-11-28 DIAGNOSIS — Z7982 Long term (current) use of aspirin: Secondary | ICD-10-CM

## 2018-11-28 LAB — BASIC METABOLIC PANEL
Anion gap: 16 — ABNORMAL HIGH (ref 5–15)
BUN: 13 mg/dL (ref 8–23)
CO2: 19 mmol/L — ABNORMAL LOW (ref 22–32)
Calcium: 8.8 mg/dL — ABNORMAL LOW (ref 8.9–10.3)
Chloride: 105 mmol/L (ref 98–111)
Creatinine, Ser: 0.72 mg/dL (ref 0.44–1.00)
GFR calc Af Amer: >60 mL/min (ref >60)
GFR calc non Af Amer: >60 mL/min (ref >60)
Glucose, Bld: 215 mg/dL — ABNORMAL HIGH (ref 70–99)
Potassium: 3.7 mmol/L (ref 3.5–5.1)
Sodium: 140 mmol/L (ref 135–145)

## 2018-11-28 LAB — CBC
HCT: 42.1 % (ref 36.0–46.0)
Hemoglobin: 13.5 g/dL (ref 12.0–15.0)
MCH: 28.1 pg (ref 26.0–34.0)
MCHC: 32.1 g/dL (ref 30.0–36.0)
MCV: 87.7 fL (ref 80.0–100.0)
Platelets: 434 10*3/uL — ABNORMAL HIGH (ref 150–400)
RBC: 4.8 MIL/uL (ref 3.87–5.11)
RDW: 15.1 % (ref 11.5–15.5)
WBC: 10.4 10*3/uL (ref 4.0–10.5)
nRBC: 0 % (ref 0.0–0.2)

## 2018-11-28 LAB — CBC WITH DIFFERENTIAL/PLATELET
Abs Immature Granulocytes: 0.02 10*3/uL (ref 0.00–0.07)
Basophils Absolute: 0.1 10*3/uL (ref 0.0–0.1)
Basophils Relative: 1 %
Eosinophils Absolute: 0 10*3/uL (ref 0.0–0.5)
Eosinophils Relative: 0 %
HCT: 41.1 % (ref 36.0–46.0)
Hemoglobin: 13.3 g/dL (ref 12.0–15.0)
Immature Granulocytes: 0 %
Lymphocytes Relative: 18 %
Lymphs Abs: 1.5 10*3/uL (ref 0.7–4.0)
MCH: 28.4 pg (ref 26.0–34.0)
MCHC: 32.4 g/dL (ref 30.0–36.0)
MCV: 87.8 fL (ref 80.0–100.0)
Monocytes Absolute: 0.5 10*3/uL (ref 0.1–1.0)
Monocytes Relative: 7 %
Neutro Abs: 6.2 10*3/uL (ref 1.7–7.7)
Neutrophils Relative %: 74 %
Platelets: 433 10*3/uL — ABNORMAL HIGH (ref 150–400)
RBC: 4.68 MIL/uL (ref 3.87–5.11)
RDW: 15 % (ref 11.5–15.5)
WBC: 8.3 10*3/uL (ref 4.0–10.5)
nRBC: 0 % (ref 0.0–0.2)

## 2018-11-28 LAB — LACTIC ACID, PLASMA
Lactic Acid, Venous: 4.1 mmol/L (ref 0.5–1.9)
Lactic Acid, Venous: 3 mmol/L (ref 0.5–1.9)

## 2018-11-28 LAB — CREATININE, SERUM
Creatinine, Ser: 0.76 mg/dL (ref 0.44–1.00)
GFR calc Af Amer: >60 mL/min (ref >60)
GFR calc non Af Amer: >60 mL/min (ref >60)

## 2018-11-28 LAB — GLUCOSE, CAPILLARY: Glucose-Capillary: 131 mg/dL — ABNORMAL HIGH (ref 70–99)

## 2018-11-28 LAB — SARS CORONAVIRUS 2 (TAT 6-24 HRS): SARS Coronavirus 2: NEGATIVE

## 2018-11-28 MED ORDER — HEPARIN SODIUM (PORCINE) 5000 UNIT/ML IJ SOLN
5000.0000 [IU] | Freq: Three times a day (TID) | INTRAMUSCULAR | Status: DC
  Administered 2018-11-29 – 2018-12-01 (×8): 5000 [IU] via SUBCUTANEOUS
  Filled 2018-11-28 (×8): qty 1

## 2018-11-28 MED ORDER — ACETAMINOPHEN 650 MG RE SUPP: 650.0000 mg | Freq: Four times a day (QID) | RECTAL | Status: DC | PRN

## 2018-11-28 MED ORDER — ACETAMINOPHEN 325 MG PO TABS
650.0000 mg | ORAL_TABLET | Freq: Four times a day (QID) | ORAL | Status: DC | PRN
  Administered 2018-11-29 – 2018-12-01 (×4): 650 mg via ORAL
  Filled 2018-11-28 (×4): qty 2

## 2018-11-28 MED ORDER — ONDANSETRON HCL 4 MG/2ML IJ SOLN
4.0000 mg | Freq: Four times a day (QID) | INTRAMUSCULAR | Status: DC | PRN
  Administered 2018-11-29 – 2018-12-01 (×2): 4 mg via INTRAVENOUS
  Filled 2018-11-28 (×2): qty 2

## 2018-11-28 MED ORDER — ACETAMINOPHEN 325 MG PO TABS
650.0000 mg | ORAL_TABLET | Freq: Once | ORAL | Status: AC
  Administered 2018-11-28: 650 mg via ORAL
  Filled 2018-11-28: qty 2

## 2018-11-28 MED ORDER — ONDANSETRON HCL 4 MG PO TABS: 4.0000 mg | ORAL_TABLET | Freq: Four times a day (QID) | ORAL | Status: DC | PRN

## 2018-11-28 MED ORDER — METOCLOPRAMIDE HCL 5 MG/ML IJ SOLN
10.0000 mg | Freq: Once | INTRAMUSCULAR | Status: AC
  Administered 2018-11-28: 10 mg via INTRAVENOUS
  Filled 2018-11-28: qty 2

## 2018-11-28 NOTE — ED Notes (Signed)
Provider made aware of elevated Lactic Acid Level
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
## 2018-11-28 NOTE — ED Triage Notes (Signed)
Pt BIB [facility] from home with SOB. Dx of Pneumonia a few wks ago. More SOB today and was sent by radiology. Was 89% on RA, then 99% on 4L. No O2 normally.   EMS VSS: CBG: 320  BP: 145/80
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
## 2018-11-29 ENCOUNTER — Inpatient Hospital Stay (HOSPITAL_COMMUNITY): Payer: Medicare Other

## 2018-11-29 ENCOUNTER — Encounter (HOSPITAL_COMMUNITY): Payer: Self-pay | Admitting: Physician Assistant

## 2018-11-29 ENCOUNTER — Other Ambulatory Visit: Payer: Self-pay

## 2018-11-29 HISTORY — PX: IR THORACENTESIS ASP PLEURAL SPACE W/IMG GUIDE: IMG5380

## 2018-11-29 LAB — CBC
HCT: 36.2 % (ref 36.0–46.0)
Hemoglobin: 12 g/dL (ref 12.0–15.0)
MCH: 28.4 pg (ref 26.0–34.0)
MCHC: 33.1 g/dL (ref 30.0–36.0)
MCV: 85.6 fL (ref 80.0–100.0)
Platelets: 376 10*3/uL (ref 150–400)
RBC: 4.23 MIL/uL (ref 3.87–5.11)
RDW: 14.9 % (ref 11.5–15.5)
WBC: 8.3 10*3/uL (ref 4.0–10.5)
nRBC: 0 % (ref 0.0–0.2)

## 2018-11-29 LAB — COMPREHENSIVE METABOLIC PANEL
ALT: 13 U/L (ref 0–44)
AST: 14 U/L — ABNORMAL LOW (ref 15–41)
Albumin: 2.8 g/dL — ABNORMAL LOW (ref 3.5–5.0)
Alkaline Phosphatase: 68 U/L (ref 38–126)
Anion gap: 11 (ref 5–15)
BUN: 14 mg/dL (ref 8–23)
CO2: 22 mmol/L (ref 22–32)
Calcium: 8.5 mg/dL — ABNORMAL LOW (ref 8.9–10.3)
Chloride: 106 mmol/L (ref 98–111)
Creatinine, Ser: 0.69 mg/dL (ref 0.44–1.00)
GFR calc Af Amer: >60 mL/min (ref >60)
GFR calc non Af Amer: >60 mL/min (ref >60)
Glucose, Bld: 161 mg/dL — ABNORMAL HIGH (ref 70–99)
Potassium: 3.4 mmol/L — ABNORMAL LOW (ref 3.5–5.1)
Sodium: 139 mmol/L (ref 135–145)
Total Bilirubin: 0.5 mg/dL (ref 0.3–1.2)
Total Protein: 6.2 g/dL — ABNORMAL LOW (ref 6.5–8.1)

## 2018-11-29 LAB — AMYLASE, PLEURAL OR PERITONEAL FLUID: Amylase, Fluid: 20 U/L

## 2018-11-29 LAB — GRAM STAIN

## 2018-11-29 LAB — BODY FLUID CELL COUNT WITH DIFFERENTIAL
Eos, Fluid: 0 %
Lymphs, Fluid: 4 %
Monocyte-Macrophage-Serous Fluid: 96 % — ABNORMAL HIGH (ref 50–90)
Neutrophil Count, Fluid: 0 % (ref 0–25)
Other Cells, Fluid: 0 %
Total Nucleated Cell Count, Fluid: 3700 cu mm — ABNORMAL HIGH (ref 0–1000)

## 2018-11-29 LAB — GLUCOSE, CAPILLARY
Glucose-Capillary: 160 mg/dL — ABNORMAL HIGH (ref 70–99)
Glucose-Capillary: 178 mg/dL — ABNORMAL HIGH (ref 70–99)
Glucose-Capillary: 190 mg/dL — ABNORMAL HIGH (ref 70–99)

## 2018-11-29 LAB — PROTEIN, PLEURAL OR PERITONEAL FLUID: Total protein, fluid: 4.5 g/dL

## 2018-11-29 MED ORDER — PRAVASTATIN SODIUM 10 MG PO TABS
10.0000 mg | ORAL_TABLET | Freq: Every morning | ORAL | Status: DC
  Administered 2018-11-29 – 2018-12-01 (×3): 10 mg via ORAL
  Filled 2018-11-29 (×3): qty 1

## 2018-11-29 MED ORDER — LOSARTAN POTASSIUM 50 MG PO TABS
100.0000 mg | ORAL_TABLET | Freq: Every day | ORAL | Status: DC
  Administered 2018-11-29 – 2018-12-01 (×3): 100 mg via ORAL
  Filled 2018-11-29 (×4): qty 2

## 2018-11-29 MED ORDER — POTASSIUM CHLORIDE CRYS ER 20 MEQ PO TBCR
40.0000 meq | EXTENDED_RELEASE_TABLET | Freq: Once | ORAL | Status: AC
  Administered 2018-11-29: 40 meq via ORAL
  Filled 2018-11-29: qty 2

## 2018-11-29 MED ORDER — LEFLUNOMIDE 20 MG PO TABS
20.0000 mg | ORAL_TABLET | Freq: Every morning | ORAL | Status: DC
  Administered 2018-11-29 – 2018-12-01 (×3): 20 mg via ORAL
  Filled 2018-11-29 (×3): qty 1

## 2018-11-29 MED ORDER — METOPROLOL SUCCINATE ER 25 MG PO TB24
25.0000 mg | ORAL_TABLET | Freq: Every day | ORAL | Status: DC
  Administered 2018-11-29 – 2018-11-30 (×2): 25 mg via ORAL
  Filled 2018-11-29 (×3): qty 1

## 2018-11-29 MED ORDER — MIRTAZAPINE 15 MG PO TABS
30.0000 mg | ORAL_TABLET | Freq: Every day | ORAL | Status: DC
  Administered 2018-11-29 – 2018-11-30 (×2): 30 mg via ORAL
  Filled 2018-11-29 (×2): qty 2

## 2018-11-29 MED ORDER — PANTOPRAZOLE SODIUM 40 MG PO TBEC
40.0000 mg | DELAYED_RELEASE_TABLET | Freq: Every morning | ORAL | Status: DC
  Administered 2018-11-29 – 2018-12-01 (×3): 40 mg via ORAL
  Filled 2018-11-29 (×3): qty 1

## 2018-11-29 MED ORDER — AMLODIPINE BESYLATE 10 MG PO TABS
10.0000 mg | ORAL_TABLET | Freq: Two times a day (BID) | ORAL | Status: DC
  Administered 2018-11-29 – 2018-12-01 (×5): 10 mg via ORAL
  Filled 2018-11-29 (×5): qty 1

## 2018-11-29 MED ORDER — ALPRAZOLAM 0.25 MG PO TABS
0.2500 mg | ORAL_TABLET | Freq: Two times a day (BID) | ORAL | Status: DC | PRN
  Administered 2018-11-30 – 2018-12-01 (×3): 0.25 mg via ORAL
  Filled 2018-11-29 (×3): qty 1

## 2018-11-29 MED ORDER — ENSURE ENLIVE PO LIQD
237.0000 mL | Freq: Two times a day (BID) | ORAL | Status: DC
  Administered 2018-11-30 – 2018-12-01 (×2): 237 mL via ORAL

## 2018-11-29 MED ORDER — TETRACAINE HCL 1 % IJ SOLN
INTRAMUSCULAR | Status: DC | PRN
  Administered 2018-11-29: 10 mg

## 2018-11-29 MED ORDER — DULOXETINE HCL 60 MG PO CPEP
60.0000 mg | ORAL_CAPSULE | Freq: Two times a day (BID) | ORAL | Status: DC
  Administered 2018-11-29 – 2018-12-01 (×6): 60 mg via ORAL
  Filled 2018-11-29 (×6): qty 1

## 2018-11-29 MED ORDER — TETRACAINE HCL 1 % IJ SOLN
100.0000 mg | Freq: Once | INTRAMUSCULAR | Status: DC
  Filled 2018-11-29: qty 10

## 2018-11-29 MED ORDER — KETOROLAC TROMETHAMINE 15 MG/ML IJ SOLN
15.0000 mg | Freq: Four times a day (QID) | INTRAMUSCULAR | Status: DC | PRN
  Administered 2018-11-29 – 2018-12-01 (×5): 15 mg via INTRAVENOUS
  Filled 2018-11-29 (×5): qty 1

## 2018-11-29 MED ORDER — OXYCODONE HCL 5 MG PO TABS
10.0000 mg | ORAL_TABLET | Freq: Four times a day (QID) | ORAL | Status: DC | PRN
  Administered 2018-11-29: 10 mg via ORAL
  Filled 2018-11-29: qty 2

## 2018-11-29 MED ORDER — TETRACAINE HCL 1 % IJ SOLN
10.0000 mg | Freq: Once | INTRAMUSCULAR | Status: DC
  Filled 2018-11-29: qty 2

## 2018-11-29 MED ORDER — INSULIN ASPART 100 UNIT/ML ~~LOC~~ SOLN
0.0000 [IU] | Freq: Three times a day (TID) | SUBCUTANEOUS | Status: DC
  Administered 2018-11-29 – 2018-11-30 (×2): 2 [IU] via SUBCUTANEOUS
  Administered 2018-11-30: 3 [IU] via SUBCUTANEOUS
  Administered 2018-11-30: 2 [IU] via SUBCUTANEOUS
  Administered 2018-12-01: 3 [IU] via SUBCUTANEOUS
  Administered 2018-12-01: 2 [IU] via SUBCUTANEOUS

## 2018-11-29 NOTE — Progress Notes (Signed)
PROGRESS NOTE    [name]  XQJ:[id number] DOB: 1940/03/27 DOA: 11/28/2018 PCP: Wenda Low, MD     Brief Narrative:  Leslie Gamble is a 78 y.o. female with medical history significant of recent right pleural effusion status post thoracentesis, COPD, rheumatoid arthritis, GERD, tobacco abuse, depression, hypertension, diabetes and previous kidney injury who presents to the ER again with progressive shortness of breath.  She was admitted here on October 22 this year with right-sided pleural effusion.  At that point she had thoracentesis where 1.3 L of fluid was removed.  She was treated for asthma exacerbation.  Fluid was exudative.  Patient cytology showed reactive cells atypical.  Cause was not fully established.  She is back again with similar symptoms but no fever or chills.    New events last 24 hours / Subjective: Admits to right-sided pleuritic pain as well as shortness of breath.  Admits to cough of clear white sputum  Assessment & Plan:   Principal Problem:   Pleural effusion on right Active Problems:   Diabetes mellitus, type 2 (HCC)   Depression   HLD (hyperlipidemia)   GERD (gastroesophageal reflux disease)   Acute on chronic respiratory failure with hypoxia (HCC)   Recurrent right-sided pleural effusion -Previous pleural effusion labs found to be exudative with negative cytology -Thoracentesis ordered with labs  Acute hypoxemic respiratory failure -Secondary to above -Currently on 1 to 2 L nasal cannula O2  Diabetes -SSI   Hypertension  -Continue Norvasc, Cozaar, Toprol  RA -Continue Arava  Hyperlipidemia -Continue pravachol   GERD -PPI   Depression/anxiety -Continue Cymbalta, xanax, remeron   Hypokalemia -Replace, trend  Lactic acidosis -Improved   DVT prophylaxis: Subcutaneous heparin Code Status: Full code Family Communication: None at bedside Disposition Plan: Pending thoracentesis and improvement of respiratory failure    Consultants:   None  Procedures:   Thoracentesis planned 11/10  Antimicrobials:  Anti-infectives (From admission, onward)   None        Objective: Vitals:   11/28/18 2128 11/28/18 2130 11/29/18 0730 11/29/18 0841  BP:  (!) 150/94  139/83  Pulse:  60  75  Resp:  20  16  Temp:  98.1 F (36.7 C) 99.1 F (37.3 C) 98.4 F (36.9 C)  TempSrc:  Oral Oral Oral  SpO2:  97%  92%  Weight:      Height: 5\' 6"  (4.481 m)       Intake/Output Summary (Last 24 hours) at 11/29/2018 1053 Last data filed at 11/29/2018 1024 Gross per 24 hour  Intake 100 ml  Output 200 ml  Net -100 ml   Filed Weights   11/28/18 1553  Weight: 68 kg    Examination:  General exam: Appears calm and comfortable  Respiratory system: Diminished breath sounds right base without respiratory distress Cardiovascular system: S1 & S2 heard, RRR. No murmurs. No pedal edema. Gastrointestinal system: Abdomen is nondistended, soft and nontender. Normal bowel sounds heard. Central nervous system: Alert and oriented. No focal neurological deficits. Speech clear.  Extremities: Symmetric in appearance  Skin: No rashes, lesions or ulcers on exposed skin  Psychiatry: Judgement and insight appear normal. Mood & affect appropriate.   Data Reviewed: I have personally reviewed following labs and imaging studies  CBC: Recent Labs  Lab 11/28/18 1618 11/28/18 2146 11/29/18 0438  WBC 8.3 10.4 8.3  NEUTROABS 6.2  --   --   HGB 13.3 13.5 12.0  HCT 41.1 42.1 36.2  MCV 87.8 87.7 85.6  PLT 433*  434* 454   Basic Metabolic Panel: Recent Labs  Lab 11/28/18 1618 11/28/18 2146 11/29/18 0438  NA 140  --  139  K 3.7  --  3.4*  CL 105  --  106  CO2 19*  --  22  GLUCOSE 215*  --  161*  BUN 13  --  14  CREATININE 0.72 0.76 0.69  CALCIUM 8.8*  --  8.5*   GFR: Estimated Creatinine Clearance: 56 mL/min (by C-G formula based on SCr of 0.69 mg/dL). Liver Function Tests: Recent Labs  Lab 11/29/18 0438  AST 14*  ALT  13  ALKPHOS 68  BILITOT 0.5  PROT 6.2*  ALBUMIN 2.8*   No results for input(s): LIPASE, AMYLASE in the last 168 hours. No results for input(s): AMMONIA in the last 168 hours. Coagulation Profile: No results for input(s): INR, PROTIME in the last 168 hours. Cardiac Enzymes: No results for input(s): CKTOTAL, CKMB, CKMBINDEX, TROPONINI in the last 168 hours. BNP (last 3 results) No results for input(s): PROBNP in the last 8760 hours. HbA1C: No results for input(s): HGBA1C in the last 72 hours. CBG: Recent Labs  Lab 11/28/18 2246  GLUCAP 131*   Lipid Profile: No results for input(s): CHOL, HDL, LDLCALC, TRIG, CHOLHDL, LDLDIRECT in the last 72 hours. Thyroid Function Tests: No results for input(s): TSH, T4TOTAL, FREET4, T3FREE, THYROIDAB in the last 72 hours. Anemia Panel: No results for input(s): VITAMINB12, FOLATE, FERRITIN, TIBC, IRON, RETICCTPCT in the last 72 hours. Sepsis Labs: Recent Labs  Lab 11/28/18 1619 11/28/18 2146  LATICACIDVEN 4.1* 3.0*    Recent Results (from the past 240 hour(s))  SARS CORONAVIRUS 2 (TAT 6-24 HRS) Nasopharyngeal Nasopharyngeal Swab     Status: None   Collection Time: 11/28/18  4:21 PM   Specimen: Nasopharyngeal Swab  Result Value Ref Range Status   SARS Coronavirus 2 NEGATIVE NEGATIVE Final    Comment: (NOTE) SARS-CoV-2 target nucleic acids are NOT DETECTED. The SARS-CoV-2 RNA is generally detectable in upper and lower respiratory specimens during the acute phase of infection. Negative results do not preclude SARS-CoV-2 infection, do not rule out co-infections with other pathogens, and should not be used as the sole basis for treatment or other patient management decisions. Negative results must be combined with clinical observations, patient history, and epidemiological information. The expected result is Negative. Fact Sheet for Patients: SugarRoll.be Fact Sheet for Healthcare Providers:  https://www.woods-mathews.com/ This test is not yet approved or cleared by the [place] FDA and  has been authorized for detection and/or diagnosis of SARS-CoV-2 by FDA under an Emergency Use Authorization (EUA). This EUA will remain  in effect (meaning this test can be used) for the duration of the COVID-19 declaration under Section 56 4(b)(1) of the Act, 21 U.S.C. section 360bbb-3(b)(1), unless the authorization is terminated or revoked sooner. Performed at [facility], [place] [address], [place], [place] [address]       Radiology Studies: Dg Chest 2 View  Result Date: 11/28/2018 CLINICAL DATA:  Pneumonia. Severe shortness of breath. EXAM: CHEST - 2 VIEW COMPARISON:  Chest x-rays dated 11/09/2018 and 11/10/2018 FINDINGS: The large right pleural effusion has reaccumulated since 11/10/2018 there is secondary compressive atelectasis of the right lung. Heart size and vascularity are normal. Left lung is clear. IMPRESSION: Large right pleural effusion has reaccumulated since the prior study. No other significant abnormalities. Electronically Signed   By: Lorriane Shire M.D.   On: 11/28/2018 15:17      Scheduled Meds: .  DULoxetine  60 mg Oral BID  . feeding supplement (ENSURE ENLIVE)  237 mL Oral BID BM  . heparin  5,000 Units Subcutaneous Q8H  . potassium chloride  40 mEq Oral Once   Continuous Infusions:   LOS: 1 day      Time spent: 40 minutes   Dessa Phi, DO [facility] 11/29/2018, 10:53 AM   Available via Epic secure chat 7am-7pm After these hours, please refer to coverage provider listed on [email or web address]

## 2018-11-29 NOTE — Plan of Care (Signed)
  Problem: Education: Goal: Knowledge of General Education information will improve Description: Including pain rating scale, medication(s)/side effects and non-pharmacologic comfort measures Outcome: Progressing   Problem: Health Behavior/Discharge Planning: Goal: Ability to manage health-related needs will improve Outcome: Progressing   Problem: Clinical Measurements: Goal: Respiratory complications will improve Outcome: Progressing   Problem: Nutrition: Goal: Adequate nutrition will be maintained Outcome: Progressing   Problem: Pain Managment: Goal: General experience of comfort will improve Outcome: Progressing   Problem: Safety: Goal: Ability to remain free from injury will improve Outcome: Progressing

## 2018-11-29 NOTE — Progress Notes (Addendum)
Daily nursing note Received pt from [facility] at which time pt was noted to be anxious for procedure. Pt was taken to IR for thoracentesis at 1300. 1.8 liters of fluid was removed during procedure. Chest Xray was taken after procedure.Studies showed possible atypical cells in pleural fluid, cytology pending. All patient needs met today, tolerated carb mod diet well. Pt ambulated in hallway  and tolerated well. Pt used walker, and had steady gait. Pt is sitting up in chair and is tolerating well

## 2018-11-29 NOTE — Procedures (Signed)
PROCEDURE SUMMARY:  Successful image-guided right thoracentesis. Yielded 1.8 liters of hazy amber fluid. Patient tolerated procedure well. EBL: Zero No immediate complications.  Specimen was sent for labs. Post procedure CXR shows no pneumothorax.  Please see imaging section of Epic for full dictation.  Joaquim Nam PA-C 11/29/2018 2:17 PM

## 2018-11-30 ENCOUNTER — Inpatient Hospital Stay (HOSPITAL_COMMUNITY): Payer: Medicare Other

## 2018-11-30 ENCOUNTER — Ambulatory Visit: Payer: Medicare Other | Admitting: Podiatry

## 2018-11-30 DIAGNOSIS — E1165 Type 2 diabetes mellitus with hyperglycemia: Secondary | ICD-10-CM

## 2018-11-30 DIAGNOSIS — E785 Hyperlipidemia, unspecified: Secondary | ICD-10-CM

## 2018-11-30 DIAGNOSIS — J9621 Acute and chronic respiratory failure with hypoxia: Secondary | ICD-10-CM

## 2018-11-30 LAB — GLUCOSE, CAPILLARY
Glucose-Capillary: 171 mg/dL — ABNORMAL HIGH (ref 70–99)
Glucose-Capillary: 187 mg/dL — ABNORMAL HIGH (ref 70–99)
Glucose-Capillary: 222 mg/dL — ABNORMAL HIGH (ref 70–99)
Glucose-Capillary: 214 mg/dL — ABNORMAL HIGH (ref 70–99)

## 2018-11-30 LAB — BASIC METABOLIC PANEL
Anion gap: 11 (ref 5–15)
BUN: 22 mg/dL (ref 8–23)
CO2: 20 mmol/L — ABNORMAL LOW (ref 22–32)
Calcium: 8.5 mg/dL — ABNORMAL LOW (ref 8.9–10.3)
Chloride: 108 mmol/L (ref 98–111)
Creatinine, Ser: 0.96 mg/dL (ref 0.44–1.00)
GFR calc Af Amer: >60 mL/min (ref >60)
GFR calc non Af Amer: 57 mL/min — ABNORMAL LOW (ref >60)
Glucose, Bld: 210 mg/dL — ABNORMAL HIGH (ref 70–99)
Potassium: 4.7 mmol/L (ref 3.5–5.1)
Sodium: 139 mmol/L (ref 135–145)

## 2018-11-30 LAB — TRIGLYCERIDES, BODY FLUIDS: Triglycerides, Fluid: 47 mg/dL

## 2018-11-30 LAB — CBC
HCT: 39.4 % (ref 36.0–46.0)
Hemoglobin: 12.5 g/dL (ref 12.0–15.0)
MCH: 27.8 pg (ref 26.0–34.0)
MCHC: 31.7 g/dL (ref 30.0–36.0)
MCV: 87.6 fL (ref 80.0–100.0)
Platelets: 430 10*3/uL — ABNORMAL HIGH (ref 150–400)
RBC: 4.5 MIL/uL (ref 3.87–5.11)
RDW: 15 % (ref 11.5–15.5)
WBC: 10.4 10*3/uL (ref 4.0–10.5)
nRBC: 0 % (ref 0.0–0.2)

## 2018-11-30 LAB — MAGNESIUM: Magnesium: 1.4 mg/dL — ABNORMAL LOW (ref 1.7–2.4)

## 2018-11-30 MED ORDER — FLUTICASONE FUROATE-VILANTEROL 100-25 MCG/INH IN AEPB
1.0000 | INHALATION_SPRAY | Freq: Every morning | RESPIRATORY_TRACT | Status: DC
  Administered 2018-12-01: 1 via RESPIRATORY_TRACT
  Filled 2018-11-30: qty 28

## 2018-11-30 MED ORDER — ALUM & MAG HYDROXIDE-SIMETH 200-200-20 MG/5ML PO SUSP
30.0000 mL | Freq: Four times a day (QID) | ORAL | Status: DC | PRN
  Administered 2018-11-30: 30 mL via ORAL
  Filled 2018-11-30: qty 30

## 2018-11-30 MED ORDER — MAGNESIUM SULFATE 2 GM/50ML IV SOLN
2.0000 g | Freq: Once | INTRAVENOUS | Status: AC
  Administered 2018-11-30: 2 g via INTRAVENOUS
  Filled 2018-11-30: qty 50

## 2018-11-30 NOTE — Progress Notes (Signed)
Initial Nutrition Assessment  RD working remotely.  DOCUMENTATION CODES:   Not applicable  INTERVENTION:   - Continue Ensure Enlive po BID, each supplement provides 350 kcal and 20 grams of protein  - Encourage adequate PO intake  NUTRITION DIAGNOSIS:   Inadequate oral intake related to decreased appetite as evidenced by per patient/family report.  GOAL:   Patient will meet greater than or equal to 90% of their needs  MONITOR:   PO intake, Supplement acceptance, Labs, Weight trends  REASON FOR ASSESSMENT:   Malnutrition Screening Tool    ASSESSMENT:   78 year old female who presented to the ED on 11/09 with SOB. Pt with recent diagnosis of pneumonia. PMH of COPD, GERD, tobacco abuse, HTN, T2DM, recent hospitalization for pneumonia complicated by right pleural effusion s/p IR thoracentesis.   11/10 - s/p thoracentesis with 1.8 L fluid removed  Spoke with pt via phone call to room. Pt reports experiencing a decreased appetite over the last couple of months. Pt also reports that she has been in and out of the hospital during that timeframe.  Pt reports that she typically eats 2 meals daily when she is at home. Pt denies snacking between meals.  Breakfast: cereal Dinner: a meal from [employer]  Pt endorses weight loss over the last couple of months. Pt reports her UBW as 170 lbs. Pt believes she now weighs around 150 lbs. Noted weight on admission of 150 lbs appears stated rather than measured. If accurate, pt has experienced a 5.3 kg weight loss in less than 1 month. This is a 7.1% weight loss which is significant for timeframe.  Pt reports that she did well at breakfast this AM and ate everything except the grits because they were too soupy. Pt reports she is drinking an Ensure Enlive at this time. RD encouraged pt to continue to eat well at meals at drink at least 2 supplements daily. Pt states that she will try. Discussed the importance of adequate kcal and protein  intake in maintaining lean muscle mass and preventing additional weight loss. Pt expresses understanding.  Pt denies any N/V and states her BMs are regular.  Meal Completion: 75% x 1 meal  Medications reviewed and include: Ensure Enlive BID, SSI, Remeron, Protonix, IV magnesium sulfate 2 grams once  Labs reviewed: magnesium 1.4 CBG's: 171-190 x 24 hours  NUTRITION - FOCUSED PHYSICAL EXAM:  Unable to complete at this time. RD working remotely.  Diet Order:   Diet Order            Diet Carb Modified Fluid consistency: Thin; Room service appropriate? Yes  Diet effective now              EDUCATION NEEDS:   Education needs have been addressed  Skin:  Skin Assessment: Reviewed RN Assessment  Last BM:  11/27/18  Height:   Ht Readings from Last 1 Encounters:  11/28/18 5\' 6"  (1.676 m)    Weight:   Wt Readings from Last 1 Encounters:  11/28/18 68 kg    Ideal Body Weight:  59.1 kg  BMI:  Body mass index is 24.21 kg/m.  Estimated Nutritional Needs:   Kcal:  1450-1650  Protein:  70-85 grams  Fluid:  1.4-1.6 L    Leslie Face, MS, RD, LDN Inpatient Clinical Dietitian Pager: [phone] Weekend/After Hours: [phone]

## 2018-11-30 NOTE — Progress Notes (Signed)
PROGRESS NOTE    Leslie Gamble  OMB:[id number] DOB: April 26, 1940 DOA: 11/28/2018 PCP: Wenda Low, MD   Brief Narrative: Leslie Gamble is a 78 y.o. femalewith medical history significant ofrecent right pleural effusion status post thoracentesis, COPD, rheumatoid arthritis, GERD, tobacco abuse, depression, hypertension, diabetes and previous kidney injury who presents to the ER again with progressive shortness of breath. Improvement with thoracentesis; cytology pending.    Assessment & Plan:   Principal Problem:   Pleural effusion on right Active Problems:   Diabetes mellitus, type 2 (HCC)   Depression   HLD (hyperlipidemia)   GERD (gastroesophageal reflux disease)   Acute on chronic respiratory failure with hypoxia (HCC)   Dyspnea Acute on chronic respiratory failure with hypoxia Secondary to pleural effusion. Improved with thoracentesis. Patient still states she has some right sided chest pain. Concerned about dyspnea with ambulation -Ambulate with pulse ox today -Repeat chest x-ray to ensure no reaccumulation -Follow-up pleural fluid cytology -PT eval  Recurrent pleural effusion Right sided. Previous studies suggest no malignancy. ?related to RA -Follow-up cytology while inpatient -Repeat CXR as mentioned above  Diabetes mellitus type 2 Last hemoglobin A1C of 6.3%. Patient is on Novolog as an outpatient -Continue SSI  Essential hypertension Well controlled. -Continue amlodipine, losartan, metoprolol  Rheumatoid arthritis -Continue Arava  Hyperlipidemia -Continue pravachol  GERD -Continue Protonix  Depression Anxiety -Continue Cymbalta, Xanax, Remeron  Hypokalemia Repleted.  Lactic acidosis Improved.   DVT prophylaxis: heparin subq Code Status:   Code Status: Full Code Family Communication: None Disposition Plan: Discharge home likely in 24 hours pending repeat CXR and PT eval   Consultants:   None  Procedures:   None   Antimicrobials:  None    Subjective: Some right sided chest pain. Walked a little bit to the restroom today.   Objective: Vitals:   11/29/18 1519 11/29/18 2331 11/30/18 0724 11/30/18 0843  BP: 120/78 130/75 (!) 116/94   Pulse: 90 60 (!) 50 60  Resp:  20    Temp: 98.7 F (37.1 C) 98.2 F (36.8 C) 97.6 F (36.4 C)   TempSrc: Oral  Oral   SpO2: 95% (!) 86% 90%   Weight:      Height:        Intake/Output Summary (Last 24 hours) at 11/30/2018 1030 Last data filed at 11/29/2018 1700 Gross per 24 hour  Intake -  Output 150 ml  Net -150 ml   Filed Weights   11/28/18 1553  Weight: 68 kg    Examination:  General exam: Appears calm and comfortable Respiratory system: Scattered wheezing and rhonchi with diminished breath sounds. Very mild rales heard bilaterally. Respiratory effort normal. Cardiovascular system: S1 & S2 heard, RRR. No murmurs, rubs, gallops or clicks. Gastrointestinal system: Abdomen is nondistended, soft and nontender. No organomegaly or masses felt. Normal bowel sounds heard. Central nervous system: Alert and oriented. No focal neurological deficits. Extremities: No edema. No calf tenderness Skin: No cyanosis. No rashes Psychiatry: Judgement and insight appear normal. Mood & affect appropriate.     Data Reviewed: I have personally reviewed following labs and imaging studies  CBC: Recent Labs  Lab 11/28/18 1618 11/28/18 2146 11/29/18 0438 11/30/18 0352  WBC 8.3 10.4 8.3 10.4  NEUTROABS 6.2  --   --   --   HGB 13.3 13.5 12.0 12.5  HCT 41.1 42.1 36.2 39.4  MCV 87.8 87.7 85.6 87.6  PLT 433* 434* 376 453*   Basic Metabolic Panel: Recent Labs  Lab 11/28/18 1618 11/28/18  2146 11/29/18 0438 11/30/18 0352  NA 140  --  139 139  K 3.7  --  3.4* 4.7  CL 105  --  106 108  CO2 19*  --  22 20*  GLUCOSE 215*  --  161* 210*  BUN 13  --  14 22  CREATININE 0.72 0.76 0.69 0.96  CALCIUM 8.8*  --  8.5* 8.5*  MG  --   --   --  1.4*   GFR: Estimated  Creatinine Clearance: 46.7 mL/min (by C-G formula based on SCr of 0.96 mg/dL). Liver Function Tests: Recent Labs  Lab 11/29/18 0438  AST 14*  ALT 13  ALKPHOS 68  BILITOT 0.5  PROT 6.2*  ALBUMIN 2.8*   No results for input(s): LIPASE, AMYLASE in the last 168 hours. No results for input(s): AMMONIA in the last 168 hours. Coagulation Profile: No results for input(s): INR, PROTIME in the last 168 hours. Cardiac Enzymes: No results for input(s): CKTOTAL, CKMB, CKMBINDEX, TROPONINI in the last 168 hours. BNP (last 3 results) No results for input(s): PROBNP in the last 8760 hours. HbA1C: No results for input(s): HGBA1C in the last 72 hours. CBG: Recent Labs  Lab 11/28/18 2246 11/29/18 1230 11/29/18 1525 11/29/18 2058 11/30/18 0726  GLUCAP 131* 160* 178* 190* 171*   Lipid Profile: No results for input(s): CHOL, HDL, LDLCALC, TRIG, CHOLHDL, LDLDIRECT in the last 72 hours. Thyroid Function Tests: No results for input(s): TSH, T4TOTAL, FREET4, T3FREE, THYROIDAB in the last 72 hours. Anemia Panel: No results for input(s): VITAMINB12, FOLATE, FERRITIN, TIBC, IRON, RETICCTPCT in the last 72 hours. Sepsis Labs: Recent Labs  Lab 11/28/18 1619 11/28/18 2146  LATICACIDVEN 4.1* 3.0*    Recent Results (from the past 240 hour(s))  SARS CORONAVIRUS 2 (TAT 6-24 HRS) Nasopharyngeal Nasopharyngeal Swab     Status: None   Collection Time: 11/28/18  4:21 PM   Specimen: Nasopharyngeal Swab  Result Value Ref Range Status   SARS Coronavirus 2 NEGATIVE NEGATIVE Final    Comment: (NOTE) SARS-CoV-2 target nucleic acids are NOT DETECTED. The SARS-CoV-2 RNA is generally detectable in upper and lower respiratory specimens during the acute phase of infection. Negative results do not preclude SARS-CoV-2 infection, do not rule out co-infections with other pathogens, and should not be used as the sole basis for treatment or other patient management decisions. Negative results must be combined with  clinical observations, patient history, and epidemiological information. The expected result is Negative. Fact Sheet for Patients: SugarRoll.be Fact Sheet for Healthcare Providers: https://www.woods-mathews.com/ This test is not yet approved or cleared by the [place] FDA and  has been authorized for detection and/or diagnosis of SARS-CoV-2 by FDA under an Emergency Use Authorization (EUA). This EUA will remain  in effect (meaning this test can be used) for the duration of the COVID-19 declaration under Section 56 4(b)(1) of the Act, 21 U.S.C. section 360bbb-3(b)(1), unless the authorization is terminated or revoked sooner. Performed at [facility], [place] [address] [place], [place] [address]   Gram stain     Status: None   Collection Time: 11/29/18  2:20 PM   Specimen: PATH Cytology Pleural fluid  Result Value Ref Range Status   Specimen Description PLEURAL  Final   Special Requests NONE  Final   Gram Stain   Final    ABUNDANT WBC PRESENT, PREDOMINANTLY MONONUCLEAR NO ORGANISMS SEEN Performed at [facility], [address] [place], [place] [address]    Report Status 11/29/2018 FINAL  Final         Radiology Studies: Dg Chest 1 View  Result Date: 11/29/2018 CLINICAL DATA:  Status post right thoracentesis. EXAM: CHEST  1 VIEW COMPARISON:  November 28, 2018. FINDINGS: No pneumothorax is noted. Stable cardiomediastinal silhouette. Right pleural effusion is significantly smaller status post thoracentesis. Left lung is clear. IMPRESSION: Right pleural effusion is significantly smaller status post thoracentesis. No pneumothorax is noted. Electronically Signed   By: Marijo Conception M.D.   On: 11/29/2018 14:36   Dg Chest 2 View  Result Date: 11/28/2018 CLINICAL DATA:  Pneumonia. Severe shortness of breath. EXAM: CHEST - 2 VIEW COMPARISON:  Chest x-rays dated 11/09/2018 and 11/10/2018 FINDINGS: The large right pleural  effusion has reaccumulated since 11/10/2018 there is secondary compressive atelectasis of the right lung. Heart size and vascularity are normal. Left lung is clear. IMPRESSION: Large right pleural effusion has reaccumulated since the prior study. No other significant abnormalities. Electronically Signed   By: Lorriane Shire M.D.   On: 11/28/2018 15:17   Ir Thoracentesis Asp Pleural Space W/img Guide  Result Date: 11/29/2018 INDICATION: Patient with recurrent symptomatic right-sided pleural effusion. Request for diagnostic and therapeutic right-sided thoracentesis. EXAM: ULTRASOUND GUIDED RIGHT THORACENTESIS MEDICATIONS: 8 mL 1% tetracaine - patient with reported anaphylactic reaction to lidocaine COMPLICATIONS: None immediate. PROCEDURE: An ultrasound guided thoracentesis was thoroughly discussed with the patient and questions answered. The benefits, risks, alternatives and complications were also discussed. The patient understands and wishes to proceed with the procedure. Written consent was obtained. Ultrasound was performed to localize and mark an adequate pocket of fluid in the right chest. The area was then prepped and draped in the normal sterile fashion. 1% Lidocaine was used for local anesthesia. Under ultrasound guidance a 6 Fr Safe-T-Centesis catheter was introduced. Thoracentesis was performed. The catheter was removed and a dressing applied. FINDINGS: A total of approximately 1.7 L of hazy, amber fluid was removed. Samples were sent to the laboratory as requested by the clinical team. IMPRESSION: Successful ultrasound guided right thoracentesis yielding 1.7 L of pleural fluid. Read by Candiss Norse, PA-C No pneumothorax on follow-up chest radiograph. Electronically Signed   By: Lucrezia Europe M.D.   On: 11/29/2018 14:19        Scheduled Meds: . amLODipine  10 mg Oral BID  . DULoxetine  60 mg Oral BID  . feeding supplement (ENSURE ENLIVE)  237 mL Oral BID BM  . heparin  5,000 Units  Subcutaneous Q8H  . insulin aspart  0-9 Units Subcutaneous TID WC  . leflunomide  20 mg Oral q morning - 10a  . losartan  100 mg Oral Daily  . metoprolol succinate  25 mg Oral Daily  . mirtazapine  30 mg Oral QHS  . pantoprazole  40 mg Oral q morning - 10a  . pravastatin  10 mg Oral q morning - 10a  . tetracaine  100 mg Infiltration Once   Continuous Infusions: . magnesium sulfate bolus IVPB       LOS: 2 days     Cordelia Poche, MD [facility] 11/30/2018, 10:30 AM  If 7PM-7AM, please contact night-coverage [email or web address]

## 2018-12-01 ENCOUNTER — Inpatient Hospital Stay (HOSPITAL_COMMUNITY): Payer: Medicare Other

## 2018-12-01 DIAGNOSIS — K5900 Constipation, unspecified: Secondary | ICD-10-CM

## 2018-12-01 LAB — GLUCOSE, CAPILLARY
Glucose-Capillary: 189 mg/dL — ABNORMAL HIGH (ref 70–99)
Glucose-Capillary: 249 mg/dL — ABNORMAL HIGH (ref 70–99)

## 2018-12-01 MED ORDER — POLYETHYLENE GLYCOL 3350 17 G PO PACK: 17.0000 g | PACK | Freq: Two times a day (BID) | ORAL | 0 refills | Status: AC

## 2018-12-01 MED ORDER — POLYETHYLENE GLYCOL 3350 17 G PO PACK
17.0000 g | PACK | Freq: Two times a day (BID) | ORAL | Status: DC
  Administered 2018-12-01: 17 g via ORAL
  Filled 2018-12-01: qty 1

## 2018-12-01 NOTE — Care Management Important Message (Signed)
Important Message  Patient Details  Name: Leslie Gamble MRN: [id number] Date of Birth: [birth year]   Medicare Important Message Given:  Yes     Orbie Pyo 12/01/2018, 3:27 PM

## 2018-12-01 NOTE — Discharge Summary (Signed)
Physician Discharge Summary  Leslie Gamble WFU:[id number] DOB: 29-Aug-1940 DOA: 11/28/2018  PCP: Wenda Low, MD  Admit date: 11/28/2018 Discharge date: 12/01/2018  Admitted From: Home Disposition: Home  Recommendations for Outpatient Follow-up:  1. Follow up with PCP in 1 week 2. Please obtain BMP/CBC in one week 3. Please follow up on the following pending results: Cytology of pleural fluid  Home Health: None Equipment/Devices: None  Discharge Condition: Stable CODE STATUS: Full code Diet recommendation: Heart healthy   Brief/Interim Summary:  Admission HPI written by Elwyn Reach, MD   Chief Complaint: Shortness of breath  HPI: Leslie Gamble is a 78 y.o. female with medical history significant of recent right pleural effusion status post thoracentesis, COPD, rheumatoid arthritis, GERD, tobacco abuse, depression, hypertension, diabetes and previous kidney injury who presents to the ER again with progressive shortness of breath.  She was admitted here on October 22 this year with right-sided pleural effusion.  At that point she had thoracentesis where 1.3 L of fluid was removed.  She was treated for asthma exacerbation.  Fluid was exudative.  Patient cytology showed reactive cells atypical.  Cause was not fully established.  She is back again with similar symptoms but no fever or chills.  No nausea vomiting or diarrhea.  Patient appears to have recurrent pleural effusion on imaging.  She is being readmitted to the hospital for evaluation..  ED Couse: Temperature is 98.1, Blood pressure 150/94 pulse 80 respiratory of 22 and oxygen sat 94% on 2 L.  Sodium 140 potassium 3.7 chloride 105 CO2 of 19 glucose 215 and BUN 13.  Creatinine 0.72 calcium 8.8 and gap of 16.  Lactic acid 4.1.  CBC within normal.  COVID-19 was negative.  Chest x-ray showed recurrent right-sided pleural effusion.  Patient being admitted to the hospital for treatment   Hospital course:  Dyspnea Acute  on chronic respiratory failure with hypoxia Secondary to pleural effusion. Improved with thoracentesis. Pleural fluid appears to be inflammatory with no evidence of infection. Cytology pending on discharge.  Recurrent pleural effusion Right sided. Previous studies suggest no malignancy. Question if related to Rheumatoid arthritis. Cytology pending as mentioned above.  Diabetes mellitus type 2 Last hemoglobin A1C of 6.3%. Patient is on Novolog as an outpatient.  Essential hypertension Well controlled. Continue amlodipine, losartan, metoprolol  Rheumatoid arthritis Continue Arava  Hyperlipidemia Continue pravachol  GERD Continue Protonix  Depression Anxiety Continue Cymbalta, Xanax, Remeron  Hypokalemia Repleted.  Lactic acidosis Improved.  Constipation Miralax on discharge.  Discharge Diagnoses:  Principal Problem:   Pleural effusion on right Active Problems:   Diabetes mellitus, type 2 (HCC)   Depression   HLD (hyperlipidemia)   GERD (gastroesophageal reflux disease)   Acute on chronic respiratory failure with hypoxia [facility])    Discharge Instructions  Discharge Instructions    Call MD for:  difficulty breathing, headache or visual disturbances   Complete by: As directed    Call MD for:  extreme fatigue   Complete by: As directed    Call MD for:  temperature >100.4   Complete by: As directed    Diet - low sodium heart healthy   Complete by: As directed    Increase activity slowly   Complete by: As directed      Allergies as of 12/01/2018      Reactions   Avelox [moxifloxacin Hcl In Nacl] Itching, Swelling   Facial swelling   Cephalexin Swelling   Nose swelling   Ciprofloxacin Swelling  Facial swelling   Clarithromycin Swelling   Facial swelling   Clindamycin Hcl Swelling   Facial swelling   Codeine Swelling   Lip swelling   Diflucan [fluconazole] Diarrhea   Lidocaine Swelling   Facial swelling   Metronidazole Swelling   Facial  swelling   Nitrofurantoin Monohyd Macro Swelling   Facial swelling   Other Itching   Reaction toMagic mouthwash    Penicillins Swelling   Facial swelling Did it involve swelling of the face/tongue/throat, SOB, or low BP? Yes Did it involve sudden or severe rash/hives, skin peeling, or any reaction on the inside of your mouth or nose? No Did you need to seek medical attention at a hospital or doctor's office? No When did it last happen?young adult If all above answers are NO, may proceed with cephalosporin use.   Shrimp [shellfish Allergy] Swelling   In nose and lips   Sitagliptin Swelling   Facial swelling   Sulfa Antibiotics Swelling   Facial swelling   Tetracyclines & Related Swelling   Facial swelling   Tramadol Hcl Swelling    facial swelling, dyspnea      Medication List    TAKE these medications   acetaminophen 500 MG tablet Commonly known as: TYLENOL Take 1,000 mg by mouth every 6 (six) hours as needed for headache (pain).   albuterol 108 (90 Base) MCG/ACT inhaler Commonly known as: VENTOLIN HFA Inhale 1-2 puffs into the lungs every 6 (six) hours as needed for wheezing or shortness of breath.   ALPRAZolam 0.25 MG tablet Commonly known as: XANAX Take 0.25 mg by mouth 2 (two) times daily as needed for anxiety.   amLODipine 10 MG tablet Commonly known as: NORVASC Take 10 mg by mouth 2 (two) times daily.   aspirin 81 MG EC tablet Take 81 mg by mouth every morning.   Breo Ellipta 100-25 MCG/INH Aepb Generic drug: fluticasone furoate-vilanterol Inhale 1 puff into the lungs every morning.   DULoxetine 60 MG capsule Commonly known as: CYMBALTA Take 1 capsule (60 mg total) by mouth 2 (two) times daily.   ferrous sulfate 325 (65 FE) MG EC tablet Take 325 mg by mouth every morning.   fluticasone 50 MCG/ACT nasal spray Commonly known as: FLONASE Place 2 sprays into both nostrils daily as needed for allergies.   insulin aspart 100 UNIT/ML  injection Commonly known as: novoLOG Inject 0-9 Units into the skin 3 (three) times daily with meals. CBG < 70: implement hypoglycemia protocol CBG 70 - 120: 0 units CBG 121 - 150: 1 unit CBG 151 - 200: 2 units CBG 201 - 250: 3 units CBG 251 - 300: 5 units CBG 301 - 350: 7 units CBG 351 - 400: 9 units CBG > 400: call MD.   leflunomide 20 MG tablet Commonly known as: ARAVA Take 20 mg by mouth every morning.   losartan 100 MG tablet Commonly known as: Cozaar Take 1 tablet (100 mg total) by mouth daily. What changed: when to take this   metFORMIN 1000 MG tablet Commonly known as: GLUCOPHAGE Take 1,000 mg by mouth 2 (two) times daily.   metoprolol succinate 25 MG 24 hr tablet Commonly known as: TOPROL-XL Take 25 mg by mouth daily.   mirtazapine 30 MG tablet Commonly known as: REMERON Take 1 tablet (30 mg total) by mouth at bedtime.   multivitamin with minerals Tabs tablet Take 1 tablet by mouth every morning. [facility]   Oxycodone HCl 10 MG Tabs Take 0.5 tablets (5 mg  total) by mouth every 6 (six) hours as needed. For knee pain What changed:   how much to take  reasons to take this  additional instructions   pantoprazole 40 MG tablet Commonly known as: PROTONIX Take 40 mg by mouth every morning.   polyethylene glycol 17 g packet Commonly known as: MIRALAX / GLYCOLAX Take 17 g by mouth 2 (two) times daily. What changed: when to take this   pravastatin 10 MG tablet Commonly known as: PRAVACHOL Take 10 mg by mouth every morning.   Systane 0.4-0.3 % Gel ophthalmic gel Generic drug: Polyethyl Glycol-Propyl Glycol Place 1 application into both eyes daily as needed (dry eyes).       Allergies  Allergen Reactions   Avelox [Moxifloxacin Hcl In Nacl] Itching and Swelling    Facial swelling   Cephalexin Swelling    Nose swelling   Ciprofloxacin Swelling    Facial swelling   Clarithromycin Swelling    Facial swelling   Clindamycin Hcl Swelling     Facial swelling   Codeine Swelling    Lip swelling   Diflucan [Fluconazole] Diarrhea   Lidocaine Swelling    Facial swelling   Metronidazole Swelling    Facial swelling   Nitrofurantoin Monohyd Macro Swelling    Facial swelling   Other Itching    Reaction toMagic mouthwash    Penicillins Swelling    Facial swelling Did it involve swelling of the face/tongue/throat, SOB, or low BP? Yes Did it involve sudden or severe rash/hives, skin peeling, or any reaction on the inside of your mouth or nose? No Did you need to seek medical attention at a hospital or doctor's office? No When did it last happen?young adult If all above answers are NO, may proceed with cephalosporin use.   Shrimp [Shellfish Allergy] Swelling    In nose and lips   Sitagliptin Swelling    Facial swelling   Sulfa Antibiotics Swelling    Facial swelling   Tetracyclines & Related Swelling    Facial swelling   Tramadol Hcl Swelling     facial swelling, dyspnea    Consultations:  None   Procedures/Studies: Dg Chest 1 View  Result Date: 11/29/2018 CLINICAL DATA:  Status post right thoracentesis. EXAM: CHEST  1 VIEW COMPARISON:  November 28, 2018. FINDINGS: No pneumothorax is noted. Stable cardiomediastinal silhouette. Right pleural effusion is significantly smaller status post thoracentesis. Left lung is clear. IMPRESSION: Right pleural effusion is significantly smaller status post thoracentesis. No pneumothorax is noted. Electronically Signed   By: Marijo Conception M.D.   On: 11/29/2018 14:36   Dg Chest 1 View  Result Date: 11/10/2018 CLINICAL DATA:  Status post thoracentesis EXAM: CHEST  1 VIEW COMPARISON:  11/09/2018 FINDINGS: Cardiac shadow is stable. Tortuous aorta is noted accentuated by the portable technique. Decrease in right-sided pleural effusion is noted following thoracentesis. No sizable pneumothorax is seen. No bony abnormality is noted. IMPRESSION: No evidence of post thoracentesis  pneumothorax. Electronically Signed   By: Inez Catalina M.D.   On: 11/10/2018 16:04   Dg Chest 2 View  Result Date: 11/30/2018 CLINICAL DATA:  RIGHT pleural effusion, dyspnea, thoracentesis yesterday EXAM: CHEST - 2 VIEW COMPARISON:  10/29/2018 FINDINGS: Normal heart size, mediastinal contours, and pulmonary vascularity. Atherosclerotic calcification aorta. Decreased RIGHT pleural effusion since previous exam. However, increased opacity is seen within the RIGHT lower lobe either representing increased atelectasis or developing consolidation. Remaining lungs clear. No pneumothorax. Bones unremarkable. IMPRESSION: Decreased RIGHT pleural effusion. Increased RIGHT lower  lobe opacity question increased atelectasis versus developing pneumonia. Electronically Signed   By: Lavonia Dana M.D.   On: 11/30/2018 11:24   Dg Chest 2 View  Result Date: 11/28/2018 CLINICAL DATA:  Pneumonia. Severe shortness of breath. EXAM: CHEST - 2 VIEW COMPARISON:  Chest x-rays dated 11/09/2018 and 11/10/2018 FINDINGS: The large right pleural effusion has reaccumulated since 11/10/2018 there is secondary compressive atelectasis of the right lung. Heart size and vascularity are normal. Left lung is clear. IMPRESSION: Large right pleural effusion has reaccumulated since the prior study. No other significant abnormalities. Electronically Signed   By: Lorriane Shire M.D.   On: 11/28/2018 15:17   Dg Chest 2 View  Result Date: 11/09/2018 CLINICAL DATA:  Evaluate pleural effusions. Shortness of breath. EXAM: CHEST - 2 VIEW COMPARISON:  11/06/2018 FINDINGS: Are normal heart size. Aortic atherosclerosis. There is a persistent right pleural effusion. Volume loss, scratch set atelectasis or consolidation within the right base is identified. Left lung is clear. IMPRESSION: 1. No change in right pleural effusion. 2. Persistent right base atelectasis or consolidation. Consider further evaluation with contrast enhanced CT of the chest. To assess  for underlying predisposing abnormality Electronically Signed   By: Kerby Moors M.D.   On: 11/09/2018 13:27   Dg Chest 2 View  Result Date: 11/06/2018 CLINICAL DATA:  78 year old female with history of shortness of breath. EXAM: CHEST - 2 VIEW COMPARISON:  Chest x-ray 10/06/2018. FINDINGS: Moderate right pleural effusion. Opacity at the right base which may reflect areas of atelectasis and/or consolidation. Left lung is clear, with exception of minimal scarring in the left mid lung (unchanged). No left pleural effusion. No evidence of pulmonary edema. Heart size is normal. Upper mediastinal contours are within normal limits. Aortic atherosclerosis. IMPRESSION: 1. Moderate right pleural effusion with areas of atelectasis and/or consolidation in the right middle and lower lobe. 2. Aortic atherosclerosis. Electronically Signed   By: Vinnie Langton M.D.   On: 11/06/2018 18:13   Ct Chest W Contrast  Result Date: 11/10/2018 CLINICAL DATA:  Pleural effusion. Right lower lobe opacity. EXAM: CT CHEST WITH CONTRAST TECHNIQUE: Multidetector CT imaging of the chest was performed during intravenous contrast administration. CONTRAST:  13mL OMNIPAQUE IOHEXOL 300 MG/ML  SOLN COMPARISON:  Radiographs yesterday, as well as 11/06/2018. No prior CT. FINDINGS: Cardiovascular: Aortic atherosclerosis. No aneurysm. Descending thoracic aorta is tortuous. Normal heart size. There are coronary artery calcifications. No pericardial effusion. Physiologic fluid in the superior pericardial recess. Mediastinum/Nodes: No enlarged mediastinal or hilar lymph nodes. No esophageal wall thickening. No visualized thyroid nodule. Lungs/Pleura: Moderate-sized free-flowing right pleural effusion. Pleural fluid measures simple fluid density. There is associated compressive atelectasis, including complete atelectasis of the right lower lobe. This limits parenchymal assessment. Questionable pleural enhancement, series 3, image 104. Mild  emphysema. No evidence of pneumonia. Linear subsegmental atelectasis in the lingula and dependent left lower lobe. No left pleural effusion. No pulmonary edema. No evidence of pulmonary mass. Minimal dependent mucus in the trachea. Upper Abdomen: Upper abdominal atherosclerosis. Mild fatty atrophy of the pancreas. No acute findings. Musculoskeletal: There are no acute or suspicious osseous abnormalities. IMPRESSION: 1. Moderate-sized free-flowing right pleural effusion with adjacent compressive atelectasis, including complete atelectasis of the right lower lobe. Questionable area of pleural enhancement in the right lower lobe which may be inflammatory, however neoplastic enhancement is not excluded. There are no other findings to suggest neoplasm in thorax. Consider fluid sampling. 2. Mild emphysema. 3. Aortic atherosclerosis.  Coronary artery calcifications. Aortic Atherosclerosis (ICD10-I70.0) and  Emphysema (ICD10-J43.9). Electronically Signed   By: Keith Rake M.D.   On: 11/10/2018 03:13   Dg Chest Port 1 View  Result Date: 12/01/2018 CLINICAL DATA:  78 year old female with right lower lobe infiltrate. EXAM: PORTABLE CHEST 1 VIEW COMPARISON:  Chest radiograph dated 11/30/2018. FINDINGS: Overall better aeration of the lungs compared to the radiograph of 11/30/2018 and decreased in the right lung base opacity. The left lung remains clear. No large pleural effusion or pneumothorax. The cardiac silhouette is within normal limits. Atherosclerotic calcification of the aorta. There is large amount of stool throughout the colon. No bowel dilatation or evidence of obstruction. Multiple surgical clips over the abdomen and suture line in the pelvis. Degenerative changes of the spine. No acute osseous pathology. IMPRESSION: 1. Overall better aeration of the lungs compared to the radiograph of 11/30/2018 with improved right lung base density. 2. Large amount of stool throughout the colon. No bowel obstruction.  Electronically Signed   By: Anner Crete M.D.   On: 12/01/2018 11:39   Dg Abd Portable 1v  Result Date: 12/01/2018 CLINICAL DATA:  78 year old female with right lower lobe infiltrate. EXAM: PORTABLE CHEST 1 VIEW COMPARISON:  Chest radiograph dated 11/30/2018. FINDINGS: Overall better aeration of the lungs compared to the radiograph of 11/30/2018 and decreased in the right lung base opacity. The left lung remains clear. No large pleural effusion or pneumothorax. The cardiac silhouette is within normal limits. Atherosclerotic calcification of the aorta. There is large amount of stool throughout the colon. No bowel dilatation or evidence of obstruction. Multiple surgical clips over the abdomen and suture line in the pelvis. Degenerative changes of the spine. No acute osseous pathology. IMPRESSION: 1. Overall better aeration of the lungs compared to the radiograph of 11/30/2018 with improved right lung base density. 2. Large amount of stool throughout the colon. No bowel obstruction. Electronically Signed   By: Anner Crete M.D.   On: 12/01/2018 11:39   Ir Thoracentesis Asp Pleural Space W/img Guide  Result Date: 11/29/2018 INDICATION: Patient with recurrent symptomatic right-sided pleural effusion. Request for diagnostic and therapeutic right-sided thoracentesis. EXAM: ULTRASOUND GUIDED RIGHT THORACENTESIS MEDICATIONS: 8 mL 1% tetracaine - patient with reported anaphylactic reaction to lidocaine COMPLICATIONS: None immediate. PROCEDURE: An ultrasound guided thoracentesis was thoroughly discussed with the patient and questions answered. The benefits, risks, alternatives and complications were also discussed. The patient understands and wishes to proceed with the procedure. Written consent was obtained. Ultrasound was performed to localize and mark an adequate pocket of fluid in the right chest. The area was then prepped and draped in the normal sterile fashion. 1% Lidocaine was used for local  anesthesia. Under ultrasound guidance a 6 Fr Safe-T-Centesis catheter was introduced. Thoracentesis was performed. The catheter was removed and a dressing applied. FINDINGS: A total of approximately 1.7 L of hazy, amber fluid was removed. Samples were sent to the laboratory as requested by the clinical team. IMPRESSION: Successful ultrasound guided right thoracentesis yielding 1.7 L of pleural fluid. Read by Candiss Norse, PA-C No pneumothorax on follow-up chest radiograph. Electronically Signed   By: Lucrezia Europe M.D.   On: 11/29/2018 14:19   Ir Thoracentesis Asp Pleural Space W/img Guide  Result Date: 11/10/2018 INDICATION: Patient admitted with shortness of breath, found to have right pleural effusion. Request is made for diagnostic and therapeutic right thoracentesis. EXAM: ULTRASOUND GUIDED RIGHT DIAGNOSTIC AND THERAPEUTIC THORACENTESIS MEDICATIONS: 8 mL 1% tetracaine COMPLICATIONS: None immediate. PROCEDURE: An ultrasound guided thoracentesis was thoroughly discussed with the patient and  questions answered. The benefits, risks, alternatives and complications were also discussed. The patient understands and wishes to proceed with the procedure. Written consent was obtained. Ultrasound was performed to localize and mark an adequate pocket of fluid in the right chest. The area was then prepped and draped in the normal sterile fashion. 1% Lidocaine was used for local anesthesia. Under ultrasound guidance a 6 Fr Safe-T-Centesis catheter was introduced. Thoracentesis was performed. The catheter was removed and a dressing applied. FINDINGS: A total of approximately 1.3 liters of amber fluid was removed. Samples were sent to the laboratory as requested by the clinical team. IMPRESSION: Successful ultrasound guided diagnostic and therapeutic right thoracentesis yielding 1.3 liters of pleural fluid. Read by: Brynda Greathouse PA-C Electronically Signed   By: Jerilynn Mages.  Shick M.D.   On: 11/10/2018 16:13       Subjective: No dyspnea.  Discharge Exam: Vitals:   11/30/18 2315 12/01/18 0808  BP: 129/68 124/65  Pulse: 61 (!) 56  Resp: 20   Temp: 98.4 F (36.9 C) 98 F (36.7 C)  SpO2: 91% 93%   Vitals:   11/30/18 0843 11/30/18 1716 11/30/18 2315 12/01/18 0808  BP:  (!) 129/57 129/68 124/65  Pulse: 60 (!) 56 61 (!) 56  Resp:  20 20   Temp:  98.2 F (36.8 C) 98.4 F (36.9 C) 98 F (36.7 C)  TempSrc:  Oral  Oral  SpO2:  92% 91% 93%  Weight:      Height:        General: Pt is alert, awake, not in acute distress Cardiovascular: RRR, S1/S2 +, no rubs, no gallops Respiratory: CTA bilaterally, no wheezing, no rhonchi Abdominal: Soft, NT, mildly distended, bowel sounds + Extremities: no edema, no cyanosis    The results of significant diagnostics from this hospitalization (including imaging, microbiology, ancillary and laboratory) are listed below for reference.     Microbiology: Recent Results (from the past 240 hour(s))  SARS CORONAVIRUS 2 (TAT 6-24 HRS) Nasopharyngeal Nasopharyngeal Swab     Status: None   Collection Time: 11/28/18  4:21 PM   Specimen: Nasopharyngeal Swab  Result Value Ref Range Status   SARS Coronavirus 2 NEGATIVE NEGATIVE Final    Comment: (NOTE) SARS-CoV-2 target nucleic acids are NOT DETECTED. The SARS-CoV-2 RNA is generally detectable in upper and lower respiratory specimens during the acute phase of infection. Negative results do not preclude SARS-CoV-2 infection, do not rule out co-infections with other pathogens, and should not be used as the sole basis for treatment or other patient management decisions. Negative results must be combined with clinical observations, patient history, and epidemiological information. The expected result is Negative. Fact Sheet for Patients: SugarRoll.be Fact Sheet for Healthcare Providers: https://www.woods-mathews.com/ This test is not yet approved or cleared by the  [place] FDA and  has been authorized for detection and/or diagnosis of SARS-CoV-2 by FDA under an Emergency Use Authorization (EUA). This EUA will remain  in effect (meaning this test can be used) for the duration of the COVID-19 declaration under Section 56 4(b)(1) of the Act, 21 U.S.C. section 360bbb-3(b)(1), unless the authorization is terminated or revoked sooner. Performed at [facility], [place] [address] [place], [place] [address]   Gram stain     Status: None   Collection Time: 11/29/18  2:20 PM   Specimen: PATH Cytology Pleural fluid  Result Value Ref Range Status   Specimen Description PLEURAL  Final   Special Requests NONE  Final   Gram Stain  Final    ABUNDANT WBC PRESENT, PREDOMINANTLY MONONUCLEAR NO ORGANISMS SEEN Performed at [facility], [place] [address], [place], [place] [address]    Report Status 11/29/2018 FINAL  Final     Labs: BNP (last 3 results) No results for input(s): BNP in the last 8760 hours. Basic Metabolic Panel: Recent Labs  Lab 11/28/18 1618 11/28/18 2146 11/29/18 0438 11/30/18 0352  NA 140  --  139 139  K 3.7  --  3.4* 4.7  CL 105  --  106 108  CO2 19*  --  22 20*  GLUCOSE 215*  --  161* 210*  BUN 13  --  14 22  CREATININE 0.72 0.76 0.69 0.96  CALCIUM 8.8*  --  8.5* 8.5*  MG  --   --   --  1.4*   Liver Function Tests: Recent Labs  Lab 11/29/18 0438  AST 14*  ALT 13  ALKPHOS 68  BILITOT 0.5  PROT 6.2*  ALBUMIN 2.8*   No results for input(s): LIPASE, AMYLASE in the last 168 hours. No results for input(s): AMMONIA in the last 168 hours. CBC: Recent Labs  Lab 11/28/18 1618 11/28/18 2146 11/29/18 0438 11/30/18 0352  WBC 8.3 10.4 8.3 10.4  NEUTROABS 6.2  --   --   --   HGB 13.3 13.5 12.0 12.5  HCT 41.1 42.1 36.2 39.4  MCV 87.8 87.7 85.6 87.6  PLT 433* 434* 376 430*   Cardiac Enzymes: No results for input(s): CKTOTAL, CKMB, CKMBINDEX, TROPONINI in the last 168 hours. BNP: Invalid input(s):  POCBNP CBG: Recent Labs  Lab 11/30/18 1138 11/30/18 1712 11/30/18 2045 12/01/18 0809 12/01/18 1207  GLUCAP 187* 222* 214* 189* 249*   D-Dimer No results for input(s): DDIMER in the last 72 hours. Hgb A1c No results for input(s): HGBA1C in the last 72 hours. Lipid Profile No results for input(s): CHOL, HDL, LDLCALC, TRIG, CHOLHDL, LDLDIRECT in the last 72 hours. Thyroid function studies No results for input(s): TSH, T4TOTAL, T3FREE, THYROIDAB in the last 72 hours.  Invalid input(s): FREET3 Anemia work up No results for input(s): VITAMINB12, FOLATE, FERRITIN, TIBC, IRON, RETICCTPCT in the last 72 hours. Urinalysis    Component Value Date/Time   COLORURINE YELLOW 11/07/2018 0001   APPEARANCEUR HAZY (A) 11/07/2018 0001   LABSPEC 1.019 11/07/2018 0001   PHURINE 6.0 11/07/2018 0001   GLUCOSEU 50 (A) 11/07/2018 0001   GLUCOSEU NEGATIVE 08/31/2012 0836   HGBUR NEGATIVE 11/07/2018 0001   BILIRUBINUR NEGATIVE 11/07/2018 0001   KETONESUR NEGATIVE 11/07/2018 0001   PROTEINUR >=300 (A) 11/07/2018 0001   UROBILINOGEN 0.2 05/31/2014 1402   NITRITE NEGATIVE 11/07/2018 0001   LEUKOCYTESUR NEGATIVE 11/07/2018 0001   Sepsis Labs Invalid input(s): PROCALCITONIN,  WBC,  LACTICIDVEN Microbiology Recent Results (from the past 240 hour(s))  SARS CORONAVIRUS 2 (TAT 6-24 HRS) Nasopharyngeal Nasopharyngeal Swab     Status: None   Collection Time: 11/28/18  4:21 PM   Specimen: Nasopharyngeal Swab  Result Value Ref Range Status   SARS Coronavirus 2 NEGATIVE NEGATIVE Final    Comment: (NOTE) SARS-CoV-2 target nucleic acids are NOT DETECTED. The SARS-CoV-2 RNA is generally detectable in upper and lower respiratory specimens during the acute phase of infection. Negative results do not preclude SARS-CoV-2 infection, do not rule out co-infections with other pathogens, and should not be used as the sole basis for treatment or other patient management decisions. Negative results must be combined  with clinical observations, patient history, and epidemiological information. The expected  result is Negative. Fact Sheet for Patients: SugarRoll.be Fact Sheet for Healthcare Providers: https://www.woods-mathews.com/ This test is not yet approved or cleared by the [place] FDA and  has been authorized for detection and/or diagnosis of SARS-CoV-2 by FDA under an Emergency Use Authorization (EUA). This EUA will remain  in effect (meaning this test can be used) for the duration of the COVID-19 declaration under Section 56 4(b)(1) of the Act, 21 U.S.C. section 360bbb-3(b)(1), unless the authorization is terminated or revoked sooner. Performed at [facility], [place] [address], [place], [place] [address]   Gram stain     Status: None   Collection Time: 11/29/18  2:20 PM   Specimen: PATH Cytology Pleural fluid  Result Value Ref Range Status   Specimen Description PLEURAL  Final   Special Requests NONE  Final   Gram Stain   Final    ABUNDANT WBC PRESENT, PREDOMINANTLY MONONUCLEAR NO ORGANISMS SEEN Performed at [facility], [address], [place], [place] [address]    Report Status 11/29/2018 FINAL  Final     Time coordinating discharge: 35 minutes  SIGNED:   Cordelia Poche, MD [facility] 12/01/2018, 1:31 PM

## 2018-12-01 NOTE — Discharge Instructions (Signed)
Leslie Gamble,  You were in the hospital because of trouble breathing. This appears to be secondary to fluid in your right lung. This was drained again and lab tests are pending. Please follow-up with your primary care physician and your rheumatologist. You also have significant constipation. Please take Miralax twice per day until you are having 1-2 soft stools daily.

## 2018-12-07 DIAGNOSIS — I10 Essential (primary) hypertension: Secondary | ICD-10-CM | POA: Diagnosis not present

## 2018-12-07 DIAGNOSIS — J90 Pleural effusion, not elsewhere classified: Secondary | ICD-10-CM | POA: Diagnosis not present

## 2018-12-07 DIAGNOSIS — C3492 Malignant neoplasm of unspecified part of left bronchus or lung: Secondary | ICD-10-CM | POA: Diagnosis not present

## 2018-12-12 DIAGNOSIS — I700 Atherosclerosis of aorta: Secondary | ICD-10-CM | POA: Diagnosis not present

## 2018-12-12 DIAGNOSIS — M160 Bilateral primary osteoarthritis of hip: Secondary | ICD-10-CM | POA: Diagnosis not present

## 2018-12-12 DIAGNOSIS — J9811 Atelectasis: Secondary | ICD-10-CM | POA: Diagnosis not present

## 2018-12-12 DIAGNOSIS — J984 Other disorders of lung: Secondary | ICD-10-CM | POA: Diagnosis not present

## 2018-12-12 DIAGNOSIS — M4050 Lordosis, unspecified, site unspecified: Secondary | ICD-10-CM | POA: Diagnosis not present

## 2018-12-12 DIAGNOSIS — J45901 Unspecified asthma with (acute) exacerbation: Secondary | ICD-10-CM | POA: Diagnosis not present

## 2018-12-12 DIAGNOSIS — M069 Rheumatoid arthritis, unspecified: Secondary | ICD-10-CM | POA: Diagnosis not present

## 2018-12-12 DIAGNOSIS — I2510 Atherosclerotic heart disease of native coronary artery without angina pectoris: Secondary | ICD-10-CM | POA: Diagnosis not present

## 2018-12-12 DIAGNOSIS — I10 Essential (primary) hypertension: Secondary | ICD-10-CM | POA: Diagnosis not present

## 2018-12-12 DIAGNOSIS — M19041 Primary osteoarthritis, right hand: Secondary | ICD-10-CM | POA: Diagnosis not present

## 2018-12-12 DIAGNOSIS — E119 Type 2 diabetes mellitus without complications: Secondary | ICD-10-CM | POA: Diagnosis not present

## 2018-12-12 DIAGNOSIS — G319 Degenerative disease of nervous system, unspecified: Secondary | ICD-10-CM | POA: Diagnosis not present

## 2018-12-12 DIAGNOSIS — M25462 Effusion, left knee: Secondary | ICD-10-CM | POA: Diagnosis not present

## 2018-12-12 DIAGNOSIS — M170 Bilateral primary osteoarthritis of knee: Secondary | ICD-10-CM | POA: Diagnosis not present

## 2018-12-12 DIAGNOSIS — F329 Major depressive disorder, single episode, unspecified: Secondary | ICD-10-CM | POA: Diagnosis not present

## 2018-12-12 DIAGNOSIS — M76892 Other specified enthesopathies of left lower limb, excluding foot: Secondary | ICD-10-CM | POA: Diagnosis not present

## 2018-12-12 DIAGNOSIS — F411 Generalized anxiety disorder: Secondary | ICD-10-CM | POA: Diagnosis not present

## 2018-12-12 DIAGNOSIS — I358 Other nonrheumatic aortic valve disorders: Secondary | ICD-10-CM | POA: Diagnosis not present

## 2018-12-12 DIAGNOSIS — J90 Pleural effusion, not elsewhere classified: Secondary | ICD-10-CM | POA: Diagnosis not present

## 2018-12-12 DIAGNOSIS — J439 Emphysema, unspecified: Secondary | ICD-10-CM | POA: Diagnosis not present

## 2018-12-12 DIAGNOSIS — M19042 Primary osteoarthritis, left hand: Secondary | ICD-10-CM | POA: Diagnosis not present

## 2018-12-12 DIAGNOSIS — J9621 Acute and chronic respiratory failure with hypoxia: Secondary | ICD-10-CM | POA: Diagnosis not present

## 2018-12-12 DIAGNOSIS — M47816 Spondylosis without myelopathy or radiculopathy, lumbar region: Secondary | ICD-10-CM | POA: Diagnosis not present

## 2018-12-12 DIAGNOSIS — M4316 Spondylolisthesis, lumbar region: Secondary | ICD-10-CM | POA: Diagnosis not present

## 2018-12-13 DIAGNOSIS — J439 Emphysema, unspecified: Secondary | ICD-10-CM | POA: Diagnosis not present

## 2018-12-13 DIAGNOSIS — I10 Essential (primary) hypertension: Secondary | ICD-10-CM | POA: Diagnosis not present

## 2018-12-13 DIAGNOSIS — J9621 Acute and chronic respiratory failure with hypoxia: Secondary | ICD-10-CM | POA: Diagnosis not present

## 2018-12-13 DIAGNOSIS — J90 Pleural effusion, not elsewhere classified: Secondary | ICD-10-CM | POA: Diagnosis not present

## 2018-12-13 DIAGNOSIS — E119 Type 2 diabetes mellitus without complications: Secondary | ICD-10-CM | POA: Diagnosis not present

## 2018-12-13 DIAGNOSIS — I2510 Atherosclerotic heart disease of native coronary artery without angina pectoris: Secondary | ICD-10-CM | POA: Diagnosis not present

## 2018-12-14 DIAGNOSIS — C3492 Malignant neoplasm of unspecified part of left bronchus or lung: Secondary | ICD-10-CM | POA: Diagnosis not present

## 2018-12-14 DIAGNOSIS — J449 Chronic obstructive pulmonary disease, unspecified: Secondary | ICD-10-CM | POA: Diagnosis not present

## 2018-12-14 DIAGNOSIS — M069 Rheumatoid arthritis, unspecified: Secondary | ICD-10-CM | POA: Diagnosis not present

## 2018-12-14 DIAGNOSIS — I10 Essential (primary) hypertension: Secondary | ICD-10-CM | POA: Diagnosis not present

## 2018-12-14 DIAGNOSIS — J45909 Unspecified asthma, uncomplicated: Secondary | ICD-10-CM | POA: Diagnosis not present

## 2018-12-14 DIAGNOSIS — M179 Osteoarthritis of knee, unspecified: Secondary | ICD-10-CM | POA: Diagnosis not present

## 2018-12-14 DIAGNOSIS — F324 Major depressive disorder, single episode, in partial remission: Secondary | ICD-10-CM | POA: Diagnosis not present

## 2018-12-14 DIAGNOSIS — E119 Type 2 diabetes mellitus without complications: Secondary | ICD-10-CM | POA: Diagnosis not present

## 2018-12-14 DIAGNOSIS — M1990 Unspecified osteoarthritis, unspecified site: Secondary | ICD-10-CM | POA: Diagnosis not present

## 2018-12-14 DIAGNOSIS — E1140 Type 2 diabetes mellitus with diabetic neuropathy, unspecified: Secondary | ICD-10-CM | POA: Diagnosis not present

## 2018-12-14 DIAGNOSIS — E1165 Type 2 diabetes mellitus with hyperglycemia: Secondary | ICD-10-CM | POA: Diagnosis not present

## 2018-12-14 DIAGNOSIS — M8580 Other specified disorders of bone density and structure, unspecified site: Secondary | ICD-10-CM | POA: Diagnosis not present

## 2018-12-15 ENCOUNTER — Encounter (HOSPITAL_COMMUNITY): Payer: Self-pay | Admitting: Emergency Medicine

## 2018-12-15 ENCOUNTER — Inpatient Hospital Stay (HOSPITAL_COMMUNITY)
Admission: EM | Admit: 2018-12-15 | Discharge: 2018-12-23 | DRG: 843 | Disposition: A | Discharge status: Home Health | Payer: Medicare Other | Attending: Internal Medicine | Admitting: Internal Medicine

## 2018-12-15 ENCOUNTER — Other Ambulatory Visit: Payer: Self-pay

## 2018-12-15 DIAGNOSIS — J80 Acute respiratory distress syndrome: Secondary | ICD-10-CM | POA: Diagnosis not present

## 2018-12-15 DIAGNOSIS — Z880 Allergy status to penicillin: Secondary | ICD-10-CM

## 2018-12-15 DIAGNOSIS — Z794 Long term (current) use of insulin: Secondary | ICD-10-CM

## 2018-12-15 DIAGNOSIS — F329 Major depressive disorder, single episode, unspecified: Secondary | ICD-10-CM | POA: Diagnosis present

## 2018-12-15 DIAGNOSIS — I272 Pulmonary hypertension, unspecified: Secondary | ICD-10-CM | POA: Diagnosis present

## 2018-12-15 DIAGNOSIS — Z20828 Contact with and (suspected) exposure to other viral communicable diseases: Secondary | ICD-10-CM | POA: Diagnosis not present

## 2018-12-15 DIAGNOSIS — N179 Acute kidney failure, unspecified: Secondary | ICD-10-CM | POA: Diagnosis present

## 2018-12-15 DIAGNOSIS — C801 Malignant (primary) neoplasm, unspecified: Secondary | ICD-10-CM | POA: Diagnosis not present

## 2018-12-15 DIAGNOSIS — Z888 Allergy status to other drugs, medicaments and biological substances status: Secondary | ICD-10-CM

## 2018-12-15 DIAGNOSIS — R7989 Other specified abnormal findings of blood chemistry: Secondary | ICD-10-CM | POA: Diagnosis present

## 2018-12-15 DIAGNOSIS — R9439 Abnormal result of other cardiovascular function study: Secondary | ICD-10-CM

## 2018-12-15 DIAGNOSIS — Z833 Family history of diabetes mellitus: Secondary | ICD-10-CM

## 2018-12-15 DIAGNOSIS — G8929 Other chronic pain: Secondary | ICD-10-CM | POA: Diagnosis present

## 2018-12-15 DIAGNOSIS — E1165 Type 2 diabetes mellitus with hyperglycemia: Secondary | ICD-10-CM | POA: Diagnosis present

## 2018-12-15 DIAGNOSIS — J9601 Acute respiratory failure with hypoxia: Secondary | ICD-10-CM | POA: Diagnosis present

## 2018-12-15 DIAGNOSIS — Z938 Other artificial opening status: Secondary | ICD-10-CM

## 2018-12-15 DIAGNOSIS — Z7951 Long term (current) use of inhaled steroids: Secondary | ICD-10-CM

## 2018-12-15 DIAGNOSIS — Z7982 Long term (current) use of aspirin: Secondary | ICD-10-CM

## 2018-12-15 DIAGNOSIS — J441 Chronic obstructive pulmonary disease with (acute) exacerbation: Secondary | ICD-10-CM | POA: Diagnosis present

## 2018-12-15 DIAGNOSIS — M069 Rheumatoid arthritis, unspecified: Secondary | ICD-10-CM | POA: Diagnosis present

## 2018-12-15 DIAGNOSIS — R0902 Hypoxemia: Secondary | ICD-10-CM

## 2018-12-15 DIAGNOSIS — I2510 Atherosclerotic heart disease of native coronary artery without angina pectoris: Secondary | ICD-10-CM | POA: Diagnosis present

## 2018-12-15 DIAGNOSIS — I252 Old myocardial infarction: Secondary | ICD-10-CM

## 2018-12-15 DIAGNOSIS — Z87891 Personal history of nicotine dependence: Secondary | ICD-10-CM

## 2018-12-15 DIAGNOSIS — I10 Essential (primary) hypertension: Secondary | ICD-10-CM | POA: Diagnosis not present

## 2018-12-15 DIAGNOSIS — R778 Other specified abnormalities of plasma proteins: Secondary | ICD-10-CM

## 2018-12-15 DIAGNOSIS — M1990 Unspecified osteoarthritis, unspecified site: Secondary | ICD-10-CM | POA: Diagnosis present

## 2018-12-15 DIAGNOSIS — E785 Hyperlipidemia, unspecified: Secondary | ICD-10-CM | POA: Diagnosis present

## 2018-12-15 DIAGNOSIS — R0602 Shortness of breath: Secondary | ICD-10-CM | POA: Diagnosis not present

## 2018-12-15 DIAGNOSIS — I5181 Takotsubo syndrome: Secondary | ICD-10-CM | POA: Diagnosis not present

## 2018-12-15 DIAGNOSIS — Z79899 Other long term (current) drug therapy: Secondary | ICD-10-CM

## 2018-12-15 DIAGNOSIS — Z885 Allergy status to narcotic agent status: Secondary | ICD-10-CM

## 2018-12-15 DIAGNOSIS — Z8249 Family history of ischemic heart disease and other diseases of the circulatory system: Secondary | ICD-10-CM

## 2018-12-15 DIAGNOSIS — J8410 Pulmonary fibrosis, unspecified: Secondary | ICD-10-CM | POA: Diagnosis present

## 2018-12-15 DIAGNOSIS — I088 Other rheumatic multiple valve diseases: Secondary | ICD-10-CM | POA: Diagnosis present

## 2018-12-15 DIAGNOSIS — J449 Chronic obstructive pulmonary disease, unspecified: Secondary | ICD-10-CM | POA: Diagnosis present

## 2018-12-15 DIAGNOSIS — J90 Pleural effusion, not elsewhere classified: Secondary | ICD-10-CM | POA: Diagnosis not present

## 2018-12-15 DIAGNOSIS — F411 Generalized anxiety disorder: Secondary | ICD-10-CM | POA: Diagnosis present

## 2018-12-15 DIAGNOSIS — IMO0002 Reserved for concepts with insufficient information to code with codable children: Secondary | ICD-10-CM | POA: Diagnosis present

## 2018-12-15 DIAGNOSIS — F419 Anxiety disorder, unspecified: Secondary | ICD-10-CM | POA: Diagnosis present

## 2018-12-15 DIAGNOSIS — Z91013 Allergy to seafood: Secondary | ICD-10-CM

## 2018-12-15 DIAGNOSIS — E876 Hypokalemia: Secondary | ICD-10-CM | POA: Diagnosis present

## 2018-12-15 DIAGNOSIS — Z881 Allergy status to other antibiotic agents status: Secondary | ICD-10-CM

## 2018-12-15 DIAGNOSIS — Z884 Allergy status to anesthetic agent status: Secondary | ICD-10-CM

## 2018-12-15 DIAGNOSIS — J910 Malignant pleural effusion: Secondary | ICD-10-CM | POA: Diagnosis present

## 2018-12-15 DIAGNOSIS — Z882 Allergy status to sulfonamides status: Secondary | ICD-10-CM

## 2018-12-15 MED ORDER — ALBUTEROL SULFATE HFA 108 (90 BASE) MCG/ACT IN AERS
2.0000 | INHALATION_SPRAY | Freq: Once | RESPIRATORY_TRACT | Status: AC
  Administered 2018-12-16: 2 via RESPIRATORY_TRACT
  Filled 2018-12-15: qty 6.7

## 2018-12-15 NOTE — ED Triage Notes (Signed)
Patient reports SOB with wheezing and occasional productive cough this evening unrelieved by nebulizer treatment at home , history of COPD , denies fever or chills .

## 2018-12-16 ENCOUNTER — Inpatient Hospital Stay (HOSPITAL_COMMUNITY): Payer: Medicare Other

## 2018-12-16 ENCOUNTER — Other Ambulatory Visit: Payer: Self-pay

## 2018-12-16 ENCOUNTER — Telehealth: Payer: Self-pay | Admitting: Internal Medicine

## 2018-12-16 ENCOUNTER — Emergency Department (HOSPITAL_COMMUNITY): Payer: Medicare Other

## 2018-12-16 ENCOUNTER — Encounter (HOSPITAL_COMMUNITY): Payer: Self-pay | Admitting: Family Medicine

## 2018-12-16 DIAGNOSIS — R0789 Other chest pain: Secondary | ICD-10-CM | POA: Diagnosis not present

## 2018-12-16 DIAGNOSIS — F329 Major depressive disorder, single episode, unspecified: Secondary | ICD-10-CM | POA: Diagnosis present

## 2018-12-16 DIAGNOSIS — J449 Chronic obstructive pulmonary disease, unspecified: Secondary | ICD-10-CM | POA: Diagnosis not present

## 2018-12-16 DIAGNOSIS — E876 Hypokalemia: Secondary | ICD-10-CM

## 2018-12-16 DIAGNOSIS — J9811 Atelectasis: Secondary | ICD-10-CM | POA: Diagnosis not present

## 2018-12-16 DIAGNOSIS — Z4682 Encounter for fitting and adjustment of non-vascular catheter: Secondary | ICD-10-CM | POA: Diagnosis not present

## 2018-12-16 DIAGNOSIS — I088 Other rheumatic multiple valve diseases: Secondary | ICD-10-CM | POA: Diagnosis present

## 2018-12-16 DIAGNOSIS — M069 Rheumatoid arthritis, unspecified: Secondary | ICD-10-CM | POA: Diagnosis present

## 2018-12-16 DIAGNOSIS — I252 Old myocardial infarction: Secondary | ICD-10-CM | POA: Diagnosis not present

## 2018-12-16 DIAGNOSIS — I5181 Takotsubo syndrome: Secondary | ICD-10-CM | POA: Diagnosis present

## 2018-12-16 DIAGNOSIS — Z20828 Contact with and (suspected) exposure to other viral communicable diseases: Secondary | ICD-10-CM | POA: Diagnosis present

## 2018-12-16 DIAGNOSIS — I361 Nonrheumatic tricuspid (valve) insufficiency: Secondary | ICD-10-CM | POA: Diagnosis not present

## 2018-12-16 DIAGNOSIS — Z87891 Personal history of nicotine dependence: Secondary | ICD-10-CM | POA: Diagnosis not present

## 2018-12-16 DIAGNOSIS — R7989 Other specified abnormal findings of blood chemistry: Secondary | ICD-10-CM | POA: Diagnosis not present

## 2018-12-16 DIAGNOSIS — J441 Chronic obstructive pulmonary disease with (acute) exacerbation: Secondary | ICD-10-CM | POA: Diagnosis present

## 2018-12-16 DIAGNOSIS — I272 Pulmonary hypertension, unspecified: Secondary | ICD-10-CM | POA: Diagnosis not present

## 2018-12-16 DIAGNOSIS — J9601 Acute respiratory failure with hypoxia: Secondary | ICD-10-CM | POA: Diagnosis present

## 2018-12-16 DIAGNOSIS — F411 Generalized anxiety disorder: Secondary | ICD-10-CM

## 2018-12-16 DIAGNOSIS — C801 Malignant (primary) neoplasm, unspecified: Secondary | ICD-10-CM | POA: Diagnosis present

## 2018-12-16 DIAGNOSIS — F419 Anxiety disorder, unspecified: Secondary | ICD-10-CM | POA: Diagnosis present

## 2018-12-16 DIAGNOSIS — I10 Essential (primary) hypertension: Secondary | ICD-10-CM | POA: Diagnosis not present

## 2018-12-16 DIAGNOSIS — J438 Other emphysema: Secondary | ICD-10-CM | POA: Diagnosis not present

## 2018-12-16 DIAGNOSIS — J8410 Pulmonary fibrosis, unspecified: Secondary | ICD-10-CM | POA: Diagnosis not present

## 2018-12-16 DIAGNOSIS — I214 Non-ST elevation (NSTEMI) myocardial infarction: Secondary | ICD-10-CM | POA: Diagnosis not present

## 2018-12-16 DIAGNOSIS — M1990 Unspecified osteoarthritis, unspecified site: Secondary | ICD-10-CM | POA: Diagnosis present

## 2018-12-16 DIAGNOSIS — N179 Acute kidney failure, unspecified: Secondary | ICD-10-CM | POA: Diagnosis present

## 2018-12-16 DIAGNOSIS — I2510 Atherosclerotic heart disease of native coronary artery without angina pectoris: Secondary | ICD-10-CM | POA: Diagnosis present

## 2018-12-16 DIAGNOSIS — J910 Malignant pleural effusion: Secondary | ICD-10-CM | POA: Diagnosis not present

## 2018-12-16 DIAGNOSIS — G8929 Other chronic pain: Secondary | ICD-10-CM

## 2018-12-16 DIAGNOSIS — Z8249 Family history of ischemic heart disease and other diseases of the circulatory system: Secondary | ICD-10-CM | POA: Diagnosis not present

## 2018-12-16 DIAGNOSIS — R0602 Shortness of breath: Secondary | ICD-10-CM | POA: Diagnosis not present

## 2018-12-16 DIAGNOSIS — N281 Cyst of kidney, acquired: Secondary | ICD-10-CM | POA: Diagnosis not present

## 2018-12-16 DIAGNOSIS — J90 Pleural effusion, not elsewhere classified: Secondary | ICD-10-CM | POA: Diagnosis not present

## 2018-12-16 DIAGNOSIS — E1165 Type 2 diabetes mellitus with hyperglycemia: Secondary | ICD-10-CM | POA: Diagnosis present

## 2018-12-16 DIAGNOSIS — R778 Other specified abnormalities of plasma proteins: Secondary | ICD-10-CM | POA: Diagnosis not present

## 2018-12-16 DIAGNOSIS — E785 Hyperlipidemia, unspecified: Secondary | ICD-10-CM | POA: Diagnosis present

## 2018-12-16 HISTORY — PX: IR THORACENTESIS ASP PLEURAL SPACE W/IMG GUIDE: IMG5380

## 2018-12-16 LAB — BASIC METABOLIC PANEL
Anion gap: 15 (ref 5–15)
BUN: 14 mg/dL (ref 8–23)
CO2: 23 mmol/L (ref 22–32)
Calcium: 8.9 mg/dL (ref 8.9–10.3)
Chloride: 103 mmol/L (ref 98–111)
Creatinine, Ser: 0.81 mg/dL (ref 0.44–1.00)
GFR calc Af Amer: >60 mL/min (ref >60)
GFR calc non Af Amer: >60 mL/min (ref >60)
Glucose, Bld: 193 mg/dL — ABNORMAL HIGH (ref 70–99)
Potassium: 3.3 mmol/L — ABNORMAL LOW (ref 3.5–5.1)
Sodium: 141 mmol/L (ref 135–145)

## 2018-12-16 LAB — GLUCOSE, PLEURAL OR PERITONEAL FLUID: Glucose, Fluid: 155 mg/dL

## 2018-12-16 LAB — CBC
HCT: 39.7 % (ref 36.0–46.0)
Hemoglobin: 13 g/dL (ref 12.0–15.0)
MCH: 28.2 pg (ref 26.0–34.0)
MCHC: 32.7 g/dL (ref 30.0–36.0)
MCV: 86.1 fL (ref 80.0–100.0)
Platelets: 385 10*3/uL (ref 150–400)
RBC: 4.61 MIL/uL (ref 3.87–5.11)
RDW: 14.6 % (ref 11.5–15.5)
WBC: 14.6 10*3/uL — ABNORMAL HIGH (ref 4.0–10.5)
nRBC: 0 % (ref 0.0–0.2)

## 2018-12-16 LAB — GLUCOSE, CAPILLARY
Glucose-Capillary: 146 mg/dL — ABNORMAL HIGH (ref 70–99)
Glucose-Capillary: 226 mg/dL — ABNORMAL HIGH (ref 70–99)
Glucose-Capillary: 373 mg/dL — ABNORMAL HIGH (ref 70–99)

## 2018-12-16 LAB — BLOOD GAS, ARTERIAL
Acid-base deficit: 0.6 mmol/L (ref 0.0–2.0)
Bicarbonate: 23.1 mmol/L (ref 20.0–28.0)
Drawn by: 560031
FIO2: 48
O2 Saturation: 92.6 %
Patient temperature: 37
pCO2 arterial: 34.6 mmHg (ref 32.0–48.0)
pH, Arterial: 7.439 (ref 7.350–7.450)
pO2, Arterial: 68.9 mmHg — ABNORMAL LOW (ref 83.0–108.0)

## 2018-12-16 LAB — BODY FLUID CELL COUNT WITH DIFFERENTIAL
Lymphs, Fluid: 9 %
Monocyte-Macrophage-Serous Fluid: 18 % — ABNORMAL LOW (ref 50–90)
Neutrophil Count, Fluid: 73 % — ABNORMAL HIGH (ref 0–25)
Total Nucleated Cell Count, Fluid: 4025 cu mm — ABNORMAL HIGH (ref 0–1000)

## 2018-12-16 LAB — LACTATE DEHYDROGENASE, PLEURAL OR PERITONEAL FLUID: LD, Fluid: 310 U/L — ABNORMAL HIGH (ref 3–23)

## 2018-12-16 LAB — PROTEIN, PLEURAL OR PERITONEAL FLUID: Total protein, fluid: 4.5 g/dL

## 2018-12-16 LAB — CBG MONITORING, ED: Glucose-Capillary: 158 mg/dL — ABNORMAL HIGH (ref 70–99)

## 2018-12-16 LAB — ECHOCARDIOGRAM COMPLETE
Height: 66 in
Weight: 2417.6 oz

## 2018-12-16 LAB — TROPONIN I (HIGH SENSITIVITY)
Troponin I (High Sensitivity): 770 ng/L (ref <18)
Troponin I (High Sensitivity): 481 ng/L (ref <18)

## 2018-12-16 LAB — SARS CORONAVIRUS 2 (TAT 6-24 HRS): SARS Coronavirus 2: NEGATIVE

## 2018-12-16 MED ORDER — ONDANSETRON HCL 4 MG/2ML IJ SOLN
4.0000 mg | Freq: Four times a day (QID) | INTRAMUSCULAR | Status: DC | PRN
  Administered 2018-12-21: 4 mg via INTRAVENOUS
  Filled 2018-12-16: qty 2

## 2018-12-16 MED ORDER — INSULIN ASPART 100 UNIT/ML ~~LOC~~ SOLN
0.0000 [IU] | SUBCUTANEOUS | Status: DC
  Administered 2018-12-16: 3 [IU] via SUBCUTANEOUS
  Administered 2018-12-16: 1 [IU] via SUBCUTANEOUS
  Administered 2018-12-16: 9 [IU] via SUBCUTANEOUS
  Administered 2018-12-16: 2 [IU] via SUBCUTANEOUS
  Administered 2018-12-17: 1 [IU] via SUBCUTANEOUS
  Administered 2018-12-17: 5 [IU] via SUBCUTANEOUS
  Administered 2018-12-17: 7 [IU] via SUBCUTANEOUS
  Administered 2018-12-17: 1 [IU] via SUBCUTANEOUS
  Administered 2018-12-17 – 2018-12-18 (×2): 7 [IU] via SUBCUTANEOUS
  Administered 2018-12-18: 2 [IU] via SUBCUTANEOUS
  Administered 2018-12-18: 1 [IU] via SUBCUTANEOUS
  Administered 2018-12-18: 7 [IU] via SUBCUTANEOUS

## 2018-12-16 MED ORDER — IPRATROPIUM-ALBUTEROL 0.5-2.5 (3) MG/3ML IN SOLN
3.0000 mL | Freq: Three times a day (TID) | RESPIRATORY_TRACT | Status: DC
  Administered 2018-12-16 – 2018-12-22 (×16): 3 mL via RESPIRATORY_TRACT
  Filled 2018-12-16 (×19): qty 3

## 2018-12-16 MED ORDER — LEFLUNOMIDE 20 MG PO TABS
20.0000 mg | ORAL_TABLET | Freq: Every morning | ORAL | Status: DC
  Administered 2018-12-16 – 2018-12-23 (×8): 20 mg via ORAL
  Filled 2018-12-16 (×8): qty 1

## 2018-12-16 MED ORDER — DULOXETINE HCL 60 MG PO CPEP
60.0000 mg | ORAL_CAPSULE | Freq: Two times a day (BID) | ORAL | Status: DC
  Administered 2018-12-16 – 2018-12-23 (×15): 60 mg via ORAL
  Filled 2018-12-16 (×15): qty 1

## 2018-12-16 MED ORDER — NITROGLYCERIN 0.4 MG SL SUBL: 0.4000 mg | SUBLINGUAL_TABLET | SUBLINGUAL | Status: DC | PRN

## 2018-12-16 MED ORDER — ACETAMINOPHEN 325 MG PO TABS
650.0000 mg | ORAL_TABLET | Freq: Once | ORAL | Status: AC
  Administered 2018-12-16: 650 mg via ORAL
  Filled 2018-12-16: qty 2

## 2018-12-16 MED ORDER — TETRACAINE HCL 1 % IJ SOLN
90.0000 mg | Freq: Once | INTRAMUSCULAR | Status: DC
  Filled 2018-12-16: qty 10

## 2018-12-16 MED ORDER — SODIUM CHLORIDE 0.9% FLUSH
3.0000 mL | Freq: Two times a day (BID) | INTRAVENOUS | Status: DC
  Administered 2018-12-16 – 2018-12-23 (×14): 3 mL via INTRAVENOUS

## 2018-12-16 MED ORDER — PANTOPRAZOLE SODIUM 40 MG PO TBEC
40.0000 mg | DELAYED_RELEASE_TABLET | Freq: Every morning | ORAL | Status: DC
  Administered 2018-12-16 – 2018-12-23 (×8): 40 mg via ORAL
  Filled 2018-12-16 (×8): qty 1

## 2018-12-16 MED ORDER — SODIUM CHLORIDE 0.9 % IV SOLN: 250.0000 mL | INTRAVENOUS | Status: DC | PRN

## 2018-12-16 MED ORDER — SODIUM CHLORIDE 0.9% FLUSH: 3.0000 mL | INTRAVENOUS | Status: DC | PRN

## 2018-12-16 MED ORDER — ONDANSETRON HCL 4 MG PO TABS: 4.0000 mg | ORAL_TABLET | Freq: Four times a day (QID) | ORAL | Status: DC | PRN

## 2018-12-16 MED ORDER — ACETAMINOPHEN 650 MG RE SUPP: 650.0000 mg | Freq: Four times a day (QID) | RECTAL | Status: DC | PRN

## 2018-12-16 MED ORDER — IPRATROPIUM-ALBUTEROL 0.5-2.5 (3) MG/3ML IN SOLN
3.0000 mL | Freq: Three times a day (TID) | RESPIRATORY_TRACT | Status: DC
  Administered 2018-12-16: 3 mL via RESPIRATORY_TRACT
  Filled 2018-12-16: qty 3

## 2018-12-16 MED ORDER — ALBUTEROL SULFATE (2.5 MG/3ML) 0.083% IN NEBU
3.0000 mL | INHALATION_SOLUTION | Freq: Four times a day (QID) | RESPIRATORY_TRACT | Status: DC | PRN
  Administered 2018-12-18: 3 mL via RESPIRATORY_TRACT
  Filled 2018-12-16: qty 3

## 2018-12-16 MED ORDER — POLYETHYLENE GLYCOL 3350 17 G PO PACK
17.0000 g | PACK | Freq: Two times a day (BID) | ORAL | Status: DC
  Administered 2018-12-16 – 2018-12-22 (×8): 17 g via ORAL
  Filled 2018-12-16 (×13): qty 1

## 2018-12-16 MED ORDER — FLUTICASONE FUROATE-VILANTEROL 100-25 MCG/INH IN AEPB
1.0000 | INHALATION_SPRAY | Freq: Every day | RESPIRATORY_TRACT | Status: DC
  Administered 2018-12-17 – 2018-12-23 (×7): 1 via RESPIRATORY_TRACT
  Filled 2018-12-16: qty 28

## 2018-12-16 MED ORDER — KETOROLAC TROMETHAMINE 15 MG/ML IJ SOLN
15.0000 mg | Freq: Once | INTRAMUSCULAR | Status: AC
  Administered 2018-12-17: 15 mg via INTRAVENOUS
  Filled 2018-12-16: qty 1

## 2018-12-16 MED ORDER — MIRTAZAPINE 15 MG PO TABS
30.0000 mg | ORAL_TABLET | Freq: Every day | ORAL | Status: DC
  Administered 2018-12-16 – 2018-12-17 (×2): 30 mg via ORAL
  Filled 2018-12-16 (×2): qty 2

## 2018-12-16 MED ORDER — ALPRAZOLAM 0.25 MG PO TABS
0.2500 mg | ORAL_TABLET | Freq: Two times a day (BID) | ORAL | Status: DC | PRN
  Administered 2018-12-16 – 2018-12-18 (×4): 0.25 mg via ORAL
  Filled 2018-12-16 (×5): qty 1

## 2018-12-16 MED ORDER — METHYLPREDNISOLONE SODIUM SUCC 40 MG IJ SOLR
40.0000 mg | Freq: Two times a day (BID) | INTRAMUSCULAR | Status: DC
  Administered 2018-12-16 – 2018-12-17 (×3): 40 mg via INTRAVENOUS
  Filled 2018-12-16 (×3): qty 1

## 2018-12-16 MED ORDER — OXYCODONE HCL 5 MG PO TABS
10.0000 mg | ORAL_TABLET | Freq: Four times a day (QID) | ORAL | Status: DC | PRN
  Administered 2018-12-16 – 2018-12-23 (×11): 10 mg via ORAL
  Filled 2018-12-16 (×13): qty 2

## 2018-12-16 MED ORDER — ACETAMINOPHEN 325 MG PO TABS
650.0000 mg | ORAL_TABLET | Freq: Four times a day (QID) | ORAL | Status: DC | PRN
  Administered 2018-12-17 – 2018-12-21 (×6): 650 mg via ORAL
  Filled 2018-12-16 (×6): qty 2

## 2018-12-16 MED ORDER — LOSARTAN POTASSIUM 50 MG PO TABS
100.0000 mg | ORAL_TABLET | Freq: Every morning | ORAL | Status: DC
  Administered 2018-12-16 – 2018-12-23 (×8): 100 mg via ORAL
  Filled 2018-12-16 (×8): qty 2

## 2018-12-16 MED ORDER — TETRACAINE HCL 1 % IJ SOLN
INTRAMUSCULAR | Status: DC | PRN
  Administered 2018-12-16: 60 mg

## 2018-12-16 MED ORDER — ORAL CARE MOUTH RINSE
15.0000 mL | Freq: Two times a day (BID) | OROMUCOSAL | Status: DC
  Administered 2018-12-17 – 2018-12-23 (×7): 15 mL via OROMUCOSAL

## 2018-12-16 MED ORDER — PRAVASTATIN SODIUM 10 MG PO TABS
10.0000 mg | ORAL_TABLET | Freq: Every day | ORAL | Status: DC
  Administered 2018-12-16: 10 mg via ORAL
  Filled 2018-12-16: qty 1

## 2018-12-16 MED ORDER — AMLODIPINE BESYLATE 10 MG PO TABS
10.0000 mg | ORAL_TABLET | Freq: Two times a day (BID) | ORAL | Status: DC
  Administered 2018-12-16 – 2018-12-17 (×3): 10 mg via ORAL
  Filled 2018-12-16 (×3): qty 1

## 2018-12-16 MED ORDER — TETRACAINE HCL 1 % IJ SOLN
10.0000 mg | Freq: Once | INTRAMUSCULAR | Status: DC
  Filled 2018-12-16: qty 2

## 2018-12-16 MED ORDER — ENOXAPARIN SODIUM 40 MG/0.4ML ~~LOC~~ SOLN
40.0000 mg | SUBCUTANEOUS | Status: DC
  Administered 2018-12-16 – 2018-12-22 (×7): 40 mg via SUBCUTANEOUS
  Filled 2018-12-16 (×7): qty 0.4

## 2018-12-16 MED ORDER — METOPROLOL SUCCINATE ER 25 MG PO TB24
25.0000 mg | ORAL_TABLET | Freq: Every day | ORAL | Status: DC
  Administered 2018-12-16 – 2018-12-23 (×8): 25 mg via ORAL
  Filled 2018-12-16 (×8): qty 1

## 2018-12-16 MED ORDER — POTASSIUM CHLORIDE CRYS ER 20 MEQ PO TBCR
40.0000 meq | EXTENDED_RELEASE_TABLET | Freq: Once | ORAL | Status: AC
  Administered 2018-12-16: 40 meq via ORAL
  Filled 2018-12-16: qty 2

## 2018-12-16 MED ORDER — ALBUTEROL SULFATE HFA 108 (90 BASE) MCG/ACT IN AERS
1.0000 | INHALATION_SPRAY | Freq: Four times a day (QID) | RESPIRATORY_TRACT | Status: DC | PRN
  Filled 2018-12-16: qty 6.7

## 2018-12-16 MED ORDER — ASPIRIN EC 81 MG PO TBEC
81.0000 mg | DELAYED_RELEASE_TABLET | Freq: Every morning | ORAL | Status: DC
  Administered 2018-12-16 – 2018-12-23 (×8): 81 mg via ORAL
  Filled 2018-12-16 (×8): qty 1

## 2018-12-16 NOTE — Plan of Care (Signed)

## 2018-12-16 NOTE — ED Notes (Signed)
Pt presented to room 82% on room air with audible wheezing. Pt. Place on NR.

## 2018-12-16 NOTE — Progress Notes (Signed)
CRITICAL VALUE ALERT  Critical Value:  Trop 770  Date & Time Notied: 12/16/2018  Provider Notified: Jeannetta Ellis   Orders Received/Actions taken:  Provider ordered stat EKG and serial trop. Patient is off the unit In IR for thoracentesis.

## 2018-12-16 NOTE — ED Notes (Signed)
Patient oxygen tank empty. Patient oxygen saturation 99% on RA.

## 2018-12-16 NOTE — ED Notes (Signed)
Please have pt call daughter when brought to a room

## 2018-12-16 NOTE — Progress Notes (Signed)
Patient received to room 5W11 from ED at 0625 via stretcher, accompanied by RN ED. Patient was placed on O2 at 15 L via NRB by ED RN.  RT [facility] notified, en route to assess patient.  Patient placed on continuous pulse ox and weaned to 2L on high flow nasal cannula. Patient O2 dropped to ~82% while on 2L, patient increased to 6L by RT. Patient awake and oriented x4, able to make her needs known to staff.  Mildly elevated BP noted upon arrival.  Respirations free and asymmetrical, decreased air flow noted in right side.  Abdominal respiration noted.  No breath sounds noted in right lobes, patient complained of pain 8/10 in right upper chest.  Verbalized discomfort while coughing.  Skin dry and intact, bruising noted to abdomen from previous heparin injections. Skin check performed with Sonny Dandy RN Patient is a recent readmission to facility on 11/9 for pleural effusion.   Patient oriented to unit and room.  All questions and concerns have been addressed. Patient has declined this nurse's offer to update family members of her condition.  Patient instructed on use of patient phone, and patient is able to update others of her condition at her discretion.  Unable to perform dysphagia screening, patient is currently NPO. DX: pleural effusion, right recurrent
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
## 2018-12-16 NOTE — ED Notes (Addendum)
Attempted to place pt on 6L Nasal Cannual pt SpO2 decreased to 87%. Pt placed back on NR

## 2018-12-16 NOTE — ED Notes (Signed)
ED Provider at bedside. 

## 2018-12-16 NOTE — TOC Initial Note (Signed)
Transition of Care [facility]) - Initial/Assessment Note    Patient Details  Name: Leslie Gamble MRN: [id number] Date of Birth: 1940-05-27  Transition of Care [facility] CM/SW Contact:    Bartholomew Crews, RN Phone Number: [phone] 12/16/2018, 2:55 PM  Clinical Narrative:                 Confirmed with Alvis Lemmings that patient is active with [facility] for RN, PT - patient will need [facility] orders for resumption of care. TOC following for transition needs.   Expected Discharge Plan: [place] Barriers to Discharge: Continued Medical Work up   Patient Goals and CMS Choice        Expected Discharge Plan and Services Expected Discharge Plan: [place]                                              Prior Living Arrangements/Services                       Activities of Daily Living      Permission Sought/Granted                  Emotional Assessment              Admission diagnosis:  Pleural effusion [J90] Hypoxia [R09.02] Acute respiratory failure with hypoxia (HCC) [J96.01] Recurrent right pleural effusion [J90] Patient Active Problem List   Diagnosis Date Noted  . Recurrent right pleural effusion 12/16/2018  . Pleural effusion 11/28/2018  . Diabetes mellitus without complication ([place]) 56/43/3295  . HLD (hyperlipidemia) 11/06/2018  . GERD (gastroesophageal reflux disease) 11/06/2018  . COPD with acute exacerbation ([place]) 11/06/2018  . HCAP (healthcare-associated pneumonia) 11/06/2018  . Acute respiratory failure with hypoxia ([place]) 11/06/2018  . RA (rheumatoid arthritis) ([place]) 11/06/2018  . Pleural effusion on right 11/06/2018  . Generalized weakness 08/29/2018  . Hypokalemia 08/29/2018  . Pain and swelling of left knee 08/29/2018  . Generalized anxiety disorder 08/29/2018  . Thrombocytosis ([place]) 02/22/2017  . AKI (acute kidney injury) ([place]) 12/26/2016  . Acute kidney injury ([place]) 12/25/2016  . Diabetes mellitus type 2,  uncontrolled ([place]) 12/25/2016  . Asthma 12/25/2016  . Severe depression ([place]) 12/25/2016  . Junctional escape rhythm 12/25/2016  . RUQ abdominal pain 12/25/2016  . Chronic pain 12/25/2016  . Hypomagnesemia 12/25/2016  . Acute blood loss anemia 05/08/2015  . Hematochezia 05/07/2015  . Bleeding gastrointestinal   . Diabetes mellitus with complication ([place])   . Essential hypertension   . Fibroid uterus 05/17/2013  . Obesity 12/30/2011  . Smoker 12/30/2011  . Diabetes mellitus, type 2 ([place])   . Depression   . COPD GOLD II   . Arthritis   . Diverticulitis    PCP:  Wenda Low, MD Pharmacy:   [id number] - [place], [place] [address] [place] [address] Phone: [phone] Fax: [phone]     Social Determinants of Health (SDOH) Interventions    Readmission Risk Interventions No flowsheet data found.

## 2018-12-16 NOTE — Progress Notes (Signed)
PROGRESS NOTE  Leslie Gamble INO:[id number] DOB: [birth year] DOA: 12/15/2018 PCP: Wenda Low, MD  HPI/Recap of past 24 hours: HPI from Dr Randalyn Rhea is a 78 y.o. female with medical history significant for rheumatoid arthritis with chronic pain, COPD, type 2 diabetes mellitus, hypertension, and anxiety, now presenting to the ED for evaluation of shortness of breath.  Patient reports that she has had intermittent dyspnea for at least 2 months now, felt well when she was discharged from the hospital on 12/01/2018 after undergoing thoracentesis for recurrent right pleural effusion.  She continued to do well up until the past 24 hours when she developed recurrent dyspnea. Pt has undergone thoracentesis twice in just over a month, analysis was consistent with exudate both times, cytology without malignant cells, and cultures were negative.  Patient denies any fevers or night sweats but believes she has been losing weight due to poor appetite but is unable to quantify. In the ED, pt saturating 82% on room air, chest x-ray is concerning for moderate to large right pleural effusion. COVID-19 screening test negative.   Today, saw patient before thoracentesis, still significantly dyspneic, noted to be wheezing, still requiring about 7 L of oxygen, reported some left-sided chest pain as well.  Denies any fever/chills, abdominal pain, nausea/vomiting/diarrhea, cough.  Assessment/Plan: Principal Problem:   Recurrent right pleural effusion Active Problems:   COPD GOLD II   Essential hypertension   Diabetes mellitus type 2, uncontrolled (HCC)   Chronic pain   Hypokalemia   Generalized anxiety disorder   Acute respiratory failure with hypoxia (HCC)   RA (rheumatoid arthritis) (HCC)  Acute hypoxic respiratory failure 2/2 recurrent right pleural effusion/?  COPD exacerbation Ongoing, still requiring about 7L of oxygen  Afebrile, with leukocytosis Status post 2 thoracentesis since October  with analysis consistent with exudate, no malignant cells on cytology, and negative cultures. ??  Related to RA ABG with hypoxia Had another thoracentesis by IR on 12/16/2018, yielded 800 mils of hazy amber fluid, fluid analysis pending Repeat chest x-ray postthoracentesis showed slight decrease in right-sided pleural effusion, no pneumothorax Status duo nebs, IV Solu-Medrol, continue inhalers Continue supplemental O2, plan to wean off Patient has an appointment to see oncologist Dr. Earlie Server on 12/20/2018  Elevated troponin Patient complains of chest pain Initial troponin 770, awaiting repeat EKG with no acute ST changes Echo with EF of 50 to 09%, grade 2 diastolic dysfunction, septal, apical, inferior apical hypokinesis consistent with Takotsubo DCM, severely elevated pulmonary artery systolic pressure Nitroglycerin as needed If repeat troponin trends up, plan to consult cardiology  Hypokalemia Replace as needed  Type II DM   A1c was 6.3% in August 2020  SSI, Accu-Cheks, hypoglycemic protocol   Hypertension  BP stable Continue Norvasc, Toprol, and losartan as tolerated   Rheumatoid arthritis  Continue leflunomide   Chronic pain Continue home regimen with Cymbalta and as-needed oxycodone   Anxiety disorder  Continue Cymbalta, Remeron, and as-needed Xanax            Malnutrition Type:      Malnutrition Characteristics:      Nutrition Interventions:       Estimated body mass index is 24.39 kg/m as calculated from the following:   Height as of this encounter: 5\' 6"  (1.676 m).   Weight as of this encounter: 68.5 kg.     Code Status: Full  Family Communication: None at bedside  Disposition Plan: To be determined   Consultants:  IR  Procedures:  Thoracentesis  on 12/16/2018  Antimicrobials:  None  DVT prophylaxis: Lovenox   Objective: Vitals:   12/16/18 1000 12/16/18 1239 12/16/18 1308 12/16/18 1553  BP:    123/76  Pulse:    92   Resp:    (!) 22  Temp:    97.8 F (36.6 C)  TempSrc:    Oral  SpO2: 96% 93% 93% 91%  Weight:      Height:        Intake/Output Summary (Last 24 hours) at 12/16/2018 1754 Last data filed at 12/16/2018 1438 Gross per 24 hour  Intake -  Output 800 ml  Net -800 ml   Filed Weights   12/16/18 0637  Weight: 68.5 kg    Exam:   General: NAD, acutely ill-appearing  Cardiovascular: S1, S2 present  Respiratory:  Breath sounds diminished on the right, mild wheezing noted  Abdomen: Soft, nontender, nondistended, bowel sounds present  Musculoskeletal: No bilateral pedal edema noted  Skin: Normal  Psychiatry: Normal mood   Data Reviewed: CBC: Recent Labs  Lab 12/16/18 0015  WBC 14.6*  HGB 13.0  HCT 39.7  MCV 86.1  PLT 536   Basic Metabolic Panel: Recent Labs  Lab 12/16/18 0015  NA 141  K 3.3*  CL 103  CO2 23  GLUCOSE 193*  BUN 14  CREATININE 0.81  CALCIUM 8.9   GFR: Estimated Creatinine Clearance: 55.3 mL/min (by C-G formula based on SCr of 0.81 mg/dL). Liver Function Tests: No results for input(s): AST, ALT, ALKPHOS, BILITOT, PROT, ALBUMIN in the last 168 hours. No results for input(s): LIPASE, AMYLASE in the last 168 hours. No results for input(s): AMMONIA in the last 168 hours. Coagulation Profile: No results for input(s): INR, PROTIME in the last 168 hours. Cardiac Enzymes: No results for input(s): CKTOTAL, CKMB, CKMBINDEX, TROPONINI in the last 168 hours. BNP (last 3 results) No results for input(s): PROBNP in the last 8760 hours. HbA1C: No results for input(s): HGBA1C in the last 72 hours. CBG: Recent Labs  Lab 12/16/18 0512 12/16/18 1056 12/16/18 1555  GLUCAP 158* 146* 226*   Lipid Profile: No results for input(s): CHOL, HDL, LDLCALC, TRIG, CHOLHDL, LDLDIRECT in the last 72 hours. Thyroid Function Tests: No results for input(s): TSH, T4TOTAL, FREET4, T3FREE, THYROIDAB in the last 72 hours. Anemia Panel: No results for input(s):  VITAMINB12, FOLATE, FERRITIN, TIBC, IRON, RETICCTPCT in the last 72 hours. Urine analysis:    Component Value Date/Time   COLORURINE YELLOW 11/07/2018 0001   APPEARANCEUR HAZY (A) 11/07/2018 0001   LABSPEC 1.019 11/07/2018 0001   PHURINE 6.0 11/07/2018 0001   GLUCOSEU 50 (A) 11/07/2018 0001   GLUCOSEU NEGATIVE 08/31/2012 0836   HGBUR NEGATIVE 11/07/2018 0001   BILIRUBINUR NEGATIVE 11/07/2018 0001   KETONESUR NEGATIVE 11/07/2018 0001   PROTEINUR >=300 (A) 11/07/2018 0001   UROBILINOGEN 0.2 05/31/2014 1402   NITRITE NEGATIVE 11/07/2018 0001   LEUKOCYTESUR NEGATIVE 11/07/2018 0001   Sepsis Labs: @LABRCNTIP (procalcitonin:4,lacticidven:4)  ) Recent Results (from the past 240 hour(s))  SARS CORONAVIRUS 2 (TAT 6-24 HRS) Nasopharyngeal Nasopharyngeal Swab     Status: None   Collection Time: 12/16/18  4:20 AM   Specimen: Nasopharyngeal Swab  Result Value Ref Range Status   SARS Coronavirus 2 NEGATIVE NEGATIVE Final    Comment: (NOTE) SARS-CoV-2 target nucleic acids are NOT DETECTED. The SARS-CoV-2 RNA is generally detectable in upper and lower respiratory specimens during the acute phase of infection. Negative results do not preclude SARS-CoV-2 infection, do not rule out co-infections with other  pathogens, and should not be used as the sole basis for treatment or other patient management decisions. Negative results must be combined with clinical observations, patient history, and epidemiological information. The expected result is Negative. Fact Sheet for Patients: SugarRoll.be Fact Sheet for Healthcare Providers: https://www.woods-mathews.com/ This test is not yet approved or cleared by the [place] FDA and  has been authorized for detection and/or diagnosis of SARS-CoV-2 by FDA under an Emergency Use Authorization (EUA). This EUA will remain  in effect (meaning this test can be used) for the duration of the COVID-19 declaration under  Section 56 4(b)(1) of the Act, 21 U.S.C. section 360bbb-3(b)(1), unless the authorization is terminated or revoked sooner. Performed at [facility], [place] [address] [place], [place] [address]       Studies: Dg Chest 1 View  Result Date: 12/16/2018 CLINICAL DATA:  Post RIGHT thoracentesis EXAM: CHEST  1 VIEW COMPARISON:  Earlier study of 12/16/2018 FINDINGS: Enlargement of cardiac silhouette with slight vascular congestion. Atherosclerotic calcification aorta. Persistent RIGHT pleural effusion and basilar atelectasis, slightly decreased. No pneumothorax following RIGHT thoracentesis. LEFT lung remains clear. Bones demineralized. IMPRESSION: Slightly decreased RIGHT pleural effusion and basilar atelectasis post thoracentesis. No pneumothorax. Enlargement of cardiac silhouette. Electronically Signed   By: Lavonia Dana M.D.   On: 12/16/2018 14:54   Dg Chest 2 View  Result Date: 12/16/2018 CLINICAL DATA:  Shortness of breath with wheezing and productive cough EXAM: CHEST - 2 VIEW COMPARISON:  Radiograph 12/01/2018, CT 11/10/2018 FINDINGS: Moderate to large right pleural effusion tracking into the fissures with adjacent opacity likely reflecting atelectasis. Left lung is essentially clear. The aorta is calcified. The remaining cardiomediastinal contours are unremarkable. No acute osseous or soft tissue abnormality. Degenerative changes are present in the imaged spine and shoulders. IMPRESSION: 1. Moderate to large right pleural effusion tracking into the fissures with adjacent opacity likely atelectasis though underlying infection is not excluded. Electronically Signed   By: Lovena Le M.D.   On: 12/16/2018 00:26   Ir Thoracentesis Asp Pleural Space W/img Guide  Result Date: 12/16/2018 INDICATION: Shortness of breath. Right-sided pleural effusion. Request for diagnostic therapeutic thoracentesis. EXAM: ULTRASOUND GUIDED RIGHT THORACENTESIS MEDICATIONS: None. COMPLICATIONS: None immediate.  Postprocedural chest x-ray negative for pneumothorax. PROCEDURE: An ultrasound guided thoracentesis was thoroughly discussed with the patient and questions answered. The benefits, risks, alternatives and complications were also discussed. The patient understands and wishes to proceed with the procedure. Written consent was obtained. Ultrasound was performed to localize and mark an adequate pocket of fluid in the right chest. The area was then prepped and draped in the normal sterile fashion. 1% Lidocaine was used for local anesthesia. Under ultrasound guidance a 6 Fr Safe-T-Centesis catheter was introduced. Thoracentesis was performed. Patient complained of significant right-sided chest tightness. At this point the procedure was terminated. The catheter was removed and a dressing applied. FINDINGS: A total of approximately 800 mL of cloudy dark amber fluid was removed. Samples were sent to the laboratory as requested by the clinical team. IMPRESSION: Successful ultrasound guided right thoracentesis yielding 800 mL of pleural fluid. Read by: Ascencion Dike PA-C Electronically Signed   By: Aletta Edouard M.D.   On: 12/16/2018 14:58    Scheduled Meds: . amLODipine  10 mg Oral BID  . aspirin EC  81 mg Oral q morning - 10a  . DULoxetine  60 mg Oral BID  . fluticasone furoate-vilanterol  1 puff Inhalation Daily  . insulin aspart  0-9 Units Subcutaneous Q4H  .  ipratropium-albuterol  3 mL Nebulization TID  . leflunomide  20 mg Oral q morning - 10a  . losartan  100 mg Oral q morning - 10a  . methylPREDNISolone (SOLU-MEDROL) injection  40 mg Intravenous Q12H  . metoprolol succinate  25 mg Oral Daily  . mirtazapine  30 mg Oral QHS  . pantoprazole  40 mg Oral q morning - 10a  . polyethylene glycol  17 g Oral BID  . pravastatin  10 mg Oral q1800  . sodium chloride flush  3 mL Intravenous Q12H  . tetracaine  10 mg Infiltration Once  . tetracaine  90 mg Infiltration Once    Continuous Infusions: . sodium  chloride       LOS: 0 days     Alma Friendly, MD [facility]  If 7PM-7AM, please contact night-coverage [email or web address] 12/16/2018, 5:54 PM
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
## 2018-12-16 NOTE — Telephone Encounter (Signed)
I received a call from Ms. Westhoff's daughter to schedule an appt w/Dr. Julien Nordmann for a new dx of lung. Pt had previously seen Dr. Lindi Adie for hematology issue, but daughter wanted her mother to be followed by Dr. Julien Nordmann. I scheduled her to see Dr. Julien Nordmann on 12/1 at 2:15pm at 1:45pm. Ms. Lester [place], pt's daughter, aware to arrive 15 minutes early. I updated both Drs. Julien Nordmann and Jackson of the daughter's decision.

## 2018-12-16 NOTE — ED Notes (Signed)
Pt placed on external purewick for urine collection

## 2018-12-16 NOTE — Progress Notes (Signed)
PHARMACIST - PHYSICIAN COMMUNICATION  CONCERNING:  Amlodipine dose   RECOMMENDATION: Pt on amlodipine 10mg  BID, which is above the standard maximum dose.  It appears that this changed from daily in Sept 2020 though the records are not available in Epic so reason is unclear.  Consider changing amlodipine to 10mg  daily and adding a medication with a different mechanism if further BP control required.  Wynona Neat, PharmD, BCPS 12/16/2018 6:45 AM

## 2018-12-16 NOTE — ED Notes (Signed)
[phone] son wants a phone call when available.

## 2018-12-16 NOTE — Procedures (Signed)
PROCEDURE SUMMARY:  Successful US guided right thoracentesis. Yielded 800 mL of hazy amber fluid. Pt tolerated procedure well, but complained of right sided chest tightness.  Procedure terminated at this point. No immediate complications.  Specimen was sent for labs. CXR ordered.  EBL < 5 mL  Ascencion Dike PA-C 12/16/2018 2:49 PM

## 2018-12-16 NOTE — Progress Notes (Signed)
  Echocardiogram 2D Echocardiogram has been performed.  Leslie Gamble 12/16/2018, 5:22 PM

## 2018-12-16 NOTE — ED Notes (Signed)
Attempted report Rn away from desk will call back in 5-10 minutes

## 2018-12-17 ENCOUNTER — Encounter (HOSPITAL_COMMUNITY): Payer: Self-pay | Admitting: Physician Assistant

## 2018-12-17 DIAGNOSIS — I214 Non-ST elevation (NSTEMI) myocardial infarction: Secondary | ICD-10-CM

## 2018-12-17 LAB — COMPREHENSIVE METABOLIC PANEL
ALT: 10 U/L (ref 0–44)
AST: 22 U/L (ref 15–41)
Albumin: 2.8 g/dL — ABNORMAL LOW (ref 3.5–5.0)
Alkaline Phosphatase: 63 U/L (ref 38–126)
Anion gap: 14 (ref 5–15)
BUN: 24 mg/dL — ABNORMAL HIGH (ref 8–23)
CO2: 22 mmol/L (ref 22–32)
Calcium: 8.6 mg/dL — ABNORMAL LOW (ref 8.9–10.3)
Chloride: 105 mmol/L (ref 98–111)
Creatinine, Ser: 1.05 mg/dL — ABNORMAL HIGH (ref 0.44–1.00)
GFR calc Af Amer: 60 mL/min — ABNORMAL LOW (ref >60)
GFR calc non Af Amer: 52 mL/min — ABNORMAL LOW (ref >60)
Glucose, Bld: 165 mg/dL — ABNORMAL HIGH (ref 70–99)
Potassium: 4.7 mmol/L (ref 3.5–5.1)
Sodium: 141 mmol/L (ref 135–145)
Total Bilirubin: 0.7 mg/dL (ref 0.3–1.2)
Total Protein: 6.5 g/dL (ref 6.5–8.1)

## 2018-12-17 LAB — GLUCOSE, CAPILLARY
Glucose-Capillary: 125 mg/dL — ABNORMAL HIGH (ref 70–99)
Glucose-Capillary: 125 mg/dL — ABNORMAL HIGH (ref 70–99)
Glucose-Capillary: 251 mg/dL — ABNORMAL HIGH (ref 70–99)
Glucose-Capillary: 307 mg/dL — ABNORMAL HIGH (ref 70–99)
Glucose-Capillary: 346 mg/dL — ABNORMAL HIGH (ref 70–99)
Glucose-Capillary: 477 mg/dL — ABNORMAL HIGH (ref 70–99)

## 2018-12-17 LAB — CBC WITH DIFFERENTIAL/PLATELET
Abs Immature Granulocytes: 0.04 10*3/uL (ref 0.00–0.07)
Basophils Absolute: 0 10*3/uL (ref 0.0–0.1)
Basophils Relative: 0 %
Eosinophils Absolute: 0 10*3/uL (ref 0.0–0.5)
Eosinophils Relative: 0 %
HCT: 36.4 % (ref 36.0–46.0)
Hemoglobin: 11.6 g/dL — ABNORMAL LOW (ref 12.0–15.0)
Immature Granulocytes: 1 %
Lymphocytes Relative: 8 %
Lymphs Abs: 0.6 10*3/uL — ABNORMAL LOW (ref 0.7–4.0)
MCH: 27.4 pg (ref 26.0–34.0)
MCHC: 31.9 g/dL (ref 30.0–36.0)
MCV: 86.1 fL (ref 80.0–100.0)
Monocytes Absolute: 0.4 10*3/uL (ref 0.1–1.0)
Monocytes Relative: 4 %
Neutro Abs: 7.1 10*3/uL (ref 1.7–7.7)
Neutrophils Relative %: 87 %
Platelets: 341 10*3/uL (ref 150–400)
RBC: 4.23 MIL/uL (ref 3.87–5.11)
RDW: 14.5 % (ref 11.5–15.5)
WBC: 8.2 10*3/uL (ref 4.0–10.5)
nRBC: 0 % (ref 0.0–0.2)

## 2018-12-17 LAB — GRAM STAIN

## 2018-12-17 LAB — TRIGLYCERIDES, BODY FLUIDS: Triglycerides, Fluid: 46 mg/dL

## 2018-12-17 LAB — STREP PNEUMONIAE URINARY ANTIGEN: Strep Pneumo Urinary Antigen: NEGATIVE

## 2018-12-17 LAB — MAGNESIUM: Magnesium: 1.5 mg/dL — ABNORMAL LOW (ref 1.7–2.4)

## 2018-12-17 LAB — BRAIN NATRIURETIC PEPTIDE: B Natriuretic Peptide: 302.6 pg/mL — ABNORMAL HIGH (ref 0.0–100.0)

## 2018-12-17 MED ORDER — PREDNISONE 20 MG PO TABS
40.0000 mg | ORAL_TABLET | Freq: Every day | ORAL | Status: DC
  Administered 2018-12-18 – 2018-12-19 (×2): 40 mg via ORAL
  Filled 2018-12-17 (×2): qty 2

## 2018-12-17 MED ORDER — AMLODIPINE BESYLATE 10 MG PO TABS
10.0000 mg | ORAL_TABLET | Freq: Every day | ORAL | Status: DC
  Administered 2018-12-18 – 2018-12-23 (×6): 10 mg via ORAL
  Filled 2018-12-17 (×6): qty 1

## 2018-12-17 MED ORDER — MAGNESIUM SULFATE 2 GM/50ML IV SOLN
2.0000 g | Freq: Once | INTRAVENOUS | Status: AC
  Administered 2018-12-17: 2 g via INTRAVENOUS
  Filled 2018-12-17: qty 50

## 2018-12-17 MED ORDER — INSULIN ASPART 100 UNIT/ML ~~LOC~~ SOLN
12.0000 [IU] | Freq: Once | SUBCUTANEOUS | Status: AC
  Administered 2018-12-17: 12 [IU] via SUBCUTANEOUS

## 2018-12-17 MED ORDER — ROSUVASTATIN CALCIUM 20 MG PO TABS
20.0000 mg | ORAL_TABLET | Freq: Every day | ORAL | Status: DC
  Administered 2018-12-17 – 2018-12-22 (×6): 20 mg via ORAL
  Filled 2018-12-17 (×6): qty 1
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
## 2018-12-17 NOTE — Progress Notes (Signed)
RT instructed patient on the use of the incentive spirometer. Patient was able to teach back method. Patient set goal of 750 will increase with tolerance.

## 2018-12-17 NOTE — Progress Notes (Signed)
PROGRESS NOTE  JEANINNE LODICO XLK:[id number] DOB: October 11, 1940 DOA: 12/15/2018 PCP: Wenda Low, MD  HPI/Recap of past 24 hours: HPI from Dr Randalyn Rhea is a 78 y.o. female with medical history significant for rheumatoid arthritis with chronic pain, COPD, type 2 diabetes mellitus, hypertension, and anxiety, now presenting to the ED for evaluation of shortness of breath.  Patient reports that she has had intermittent dyspnea for at least 2 months now, felt well when she was discharged from the hospital on 12/01/2018 after undergoing thoracentesis for recurrent right pleural effusion.  She continued to do well up until the past 24 hours when she developed recurrent dyspnea. Pt has undergone thoracentesis twice in just over a month, analysis was consistent with exudate both times, cytology without malignant cells, and cultures were negative.  Patient denies any fevers or night sweats but believes she has been losing weight due to poor appetite but is unable to quantify. In the ED, pt saturating 82% on room air, chest x-ray is concerning for moderate to large right pleural effusion. COVID-19 screening test negative.     Today, patient reports feeling slightly better, denies any worsening shortness of breath, denies any chest pain, nausea/vomiting, fever/chills, cough.  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 Earlie Server on 12/20/2018  ?  NSTEMI/Takotsubo cardiomyopathy Currently chest pain-free BNP 302.6 Troponin elevated, currently trending downwards 770-->481 EKG with new T wave inversions in lateral leads Echo with EF of 50 to 36%, grade 2 diastolic dysfunction, septal, apical, inferior apical hypokinesis consistent with Takotsubo DCM, severely elevated pulmonary artery systolic pressure Cardiology consulted, plan for possible nuclear stress test on 12/19/2018 Continue beta-blocker, statins  Hypokalemia Replace as needed  Type II DM   A1c was 6.3% in August 2020  SSI, Accu-Cheks, hypoglycemic protocol   Hypertension  BP stable Continue Norvasc, Toprol, and losartan as tolerated   Rheumatoid arthritis  Continue leflunomide   Chronic pain Continue home regimen with Cymbalta and as-needed oxycodone   Anxiety disorder  Continue Cymbalta, Remeron, and as-needed Xanax            Malnutrition Type:      Malnutrition Characteristics:      Nutrition Interventions:       Estimated body mass index is 24.39 kg/m as calculated from the following:   Height as of this encounter: 5\' 6"  (1.676 m).   Weight as of this encounter: 68.5 kg.     Code Status: Full  Family Communication: Discussed with Daughter on 11/26/2018  Disposition Plan: To be determined   Consultants:  IR  Cardiology  Procedures:  Thoracentesis on 12/16/2018  Antimicrobials:  None  DVT prophylaxis: Lovenox  Objective: Vitals:   12/12/2018 0541 12/15/2018 0923 12/01/2018 1317 11/25/2018 1437  BP: 114/84  120/68 105/63   Pulse: 65  81 72  Resp:   16 18  Temp:   97.9 F (36.6 C) 98.5 F (36.9 C)  TempSrc:   Oral Axillary  SpO2: 95% 92% 91% 93%  Weight:      Height:        Intake/Output Summary (Last 24 hours) at 12/01/2018 1542 Last data filed at 11/20/2018 0936 Gross per 24 hour  Intake 126 ml  Output 350 ml  Net -224 ml   Filed Weights   12/16/18 0637  Weight: 68.5 kg    Exam:   General: NAD, chronically ill-appearing  Cardiovascular: S1, S2 present  Respiratory:  Diminished breath sounds bilaterally  Abdomen: Soft, nontender, nondistended, bowel sounds present  Musculoskeletal: No bilateral pedal edema noted  Skin: Normal  Psychiatry: Normal mood   Data Reviewed: CBC: Recent Labs  Lab 12/16/18 0015 12/13/2018 0225  WBC 14.6* 8.2  NEUTROABS  --  7.1  HGB 13.0 11.6*  HCT 39.7 36.4  MCV 86.1 86.1  PLT 385 809   Basic Metabolic Panel: Recent Labs  Lab 12/16/18 0015 12/06/2018 0225  NA 141 141  K 3.3* 4.7  CL 103 105  CO2 23 22  GLUCOSE 193* 165*  BUN 14 24*  CREATININE 0.81 1.05*  CALCIUM 8.9 8.6*  MG  --  1.5*   GFR: Estimated Creatinine Clearance: 42.7 mL/min (A) (by C-G formula based on SCr of 1.05 mg/dL (H)). Liver Function Tests: Recent Labs  Lab 11/20/2018 0225  AST 22  ALT 10  ALKPHOS 63  BILITOT 0.7  PROT 6.5  ALBUMIN 2.8*   No results for input(s): LIPASE, AMYLASE in the last 168 hours. No results for input(s): AMMONIA in the last 168 hours. Coagulation Profile: No results for input(s): INR, PROTIME in the last 168 hours. Cardiac Enzymes: No results for input(s): CKTOTAL, CKMB, CKMBINDEX, TROPONINI in the last 168 hours. BNP (last 3 results) No results for input(s): PROBNP in the last 8760 hours. HbA1C: No results for input(s): HGBA1C in the last 72 hours. CBG: Recent Labs  Lab 12/16/18 2131 11/22/2018 0007 11/23/2018 0413 12/08/2018 0856 11/29/2018 1205  GLUCAP 373* 307* 125* 125* 251*   Lipid Profile: No results for input(s): CHOL, HDL,  LDLCALC, TRIG, CHOLHDL, LDLDIRECT in the last 72 hours. Thyroid Function Tests: No results for input(s): TSH, T4TOTAL, FREET4, T3FREE, THYROIDAB in the last 72 hours. Anemia Panel: No results for input(s): VITAMINB12, FOLATE, FERRITIN, TIBC, IRON, RETICCTPCT in the last 72 hours. Urine analysis:    Component Value Date/Time   COLORURINE YELLOW 11/07/2018 0001   APPEARANCEUR HAZY (A) 11/07/2018 0001   LABSPEC 1.019 11/07/2018 0001   PHURINE 6.0 11/07/2018 0001   GLUCOSEU 50 (A) 11/07/2018 0001   GLUCOSEU NEGATIVE 08/31/2012 0836   HGBUR NEGATIVE 11/07/2018 0001   BILIRUBINUR NEGATIVE 11/07/2018 0001   KETONESUR NEGATIVE 11/07/2018 0001   PROTEINUR >=300 (A) 11/07/2018 0001   UROBILINOGEN 0.2 05/31/2014 1402   NITRITE NEGATIVE 11/07/2018 0001   LEUKOCYTESUR NEGATIVE 11/07/2018 0001   Sepsis Labs: @LABRCNTIP (procalcitonin:4,lacticidven:4)  ) Recent Results (from the past 240 hour(s))  SARS CORONAVIRUS 2 (TAT 6-24 HRS) Nasopharyngeal Nasopharyngeal Swab     Status: None   Collection Time: 12/16/18  4:20 AM   Specimen: Nasopharyngeal Swab  Result Value Ref Range Status   SARS Coronavirus 2 NEGATIVE NEGATIVE Final    Comment: (NOTE) SARS-CoV-2 target nucleic acids are  NOT DETECTED. The SARS-CoV-2 RNA is generally detectable in upper and lower respiratory specimens during the acute phase of infection. Negative results do not preclude SARS-CoV-2 infection, do not rule out co-infections with other pathogens, and should not be used as the sole basis for treatment or other patient management decisions. Negative results must be combined with clinical observations, patient history, and epidemiological information. The expected result is Negative. Fact Sheet for Patients: SugarRoll.be Fact Sheet for Healthcare Providers: https://www.woods-mathews.com/ This test is not yet approved or cleared by the [place] FDA and  has been authorized for  detection and/or diagnosis of SARS-CoV-2 by FDA under an Emergency Use Authorization (EUA). This EUA will remain  in effect (meaning this test can be used) for the duration of the COVID-19 declaration under Section 56 4(b)(1) of the Act, 21 U.S.C. section 360bbb-3(b)(1), unless the authorization is terminated or revoked sooner. Performed at [facility], [place] [address] [place], [place] [address]   Gram stain     Status: None   Collection Time: 12/16/18  2:54 PM   Specimen: Lung, Right; Pleural Fluid  Result Value Ref Range Status   Specimen Description FLUID RIGHT PLEURAL  Final   Special Requests NONE  Final   Gram Stain   Final    MODERATE WBC PRESENT, PREDOMINANTLY PMN NO ORGANISMS SEEN Performed at [facility], [place] [address] [place], [place] [address]    Report Status 12/10/2018 FINAL  Final  Culture, body fluid-bottle     Status: None (Preliminary result)   Collection Time: 12/16/18  2:54 PM   Specimen: Fluid  Result Value Ref Range Status   Specimen Description FLUID RIGHT PLEURAL  Final   Special Requests BOTTLES DRAWN AEROBIC AND ANAEROBIC 10CC  Final   Culture   Final    NO GROWTH < 24 HOURS Performed at [facility], [place] [address], [place], [place] [address]    Report Status PENDING  Incomplete      Studies: No results found.  Scheduled Meds: . amLODipine  10 mg Oral BID  . aspirin EC  81 mg Oral q morning - 10a  . DULoxetine  60 mg Oral BID  . enoxaparin (LOVENOX) injection  40 mg Subcutaneous Q24H  . fluticasone furoate-vilanterol  1 puff Inhalation Daily  . insulin aspart  0-9 Units Subcutaneous Q4H  . ipratropium-albuterol  3 mL Nebulization TID  . leflunomide  20 mg Oral q morning - 10a  . losartan  100 mg Oral q morning - 10a  . mouth rinse  15 mL Mouth Rinse BID  . methylPREDNISolone (SOLU-MEDROL) injection  40 mg Intravenous Q12H  . metoprolol succinate  25 mg Oral Daily  . mirtazapine  30 mg Oral QHS  . pantoprazole  40 mg  Oral q morning - 10a  . polyethylene glycol  17 g Oral BID  . rosuvastatin  20 mg Oral q1800  . sodium chloride flush  3 mL Intravenous Q12H  . tetracaine  10 mg Infiltration Once  . tetracaine  90 mg Infiltration Once    Continuous Infusions: . sodium chloride       LOS: 1 day     Alma Friendly, MD [facility]  If 7PM-7AM, please contact night-coverage [email or web address] 12/16/2018, 3:42 PM

## 2018-12-18 LAB — EXPECTORATED SPUTUM ASSESSMENT W GRAM STAIN, RFLX TO RESP C

## 2018-12-18 LAB — LIPID PANEL
Cholesterol: 115 mg/dL (ref 0–200)
HDL: 59 mg/dL (ref >40)
LDL Cholesterol: 43 mg/dL (ref 0–99)
Total CHOL/HDL Ratio: 1.9 RATIO
Triglycerides: 67 mg/dL (ref <150)
VLDL: 13 mg/dL (ref 0–40)

## 2018-12-18 LAB — CBC WITH DIFFERENTIAL/PLATELET
Abs Immature Granulocytes: 0.11 10*3/uL — ABNORMAL HIGH (ref 0.00–0.07)
Basophils Absolute: 0 10*3/uL (ref 0.0–0.1)
Basophils Relative: 0 %
Eosinophils Absolute: 0 10*3/uL (ref 0.0–0.5)
Eosinophils Relative: 0 %
HCT: 36.7 % (ref 36.0–46.0)
Hemoglobin: 11.8 g/dL — ABNORMAL LOW (ref 12.0–15.0)
Immature Granulocytes: 1 %
Lymphocytes Relative: 5 %
Lymphs Abs: 1 10*3/uL (ref 0.7–4.0)
MCH: 28 pg (ref 26.0–34.0)
MCHC: 32.2 g/dL (ref 30.0–36.0)
MCV: 87 fL (ref 80.0–100.0)
Monocytes Absolute: 1.3 10*3/uL — ABNORMAL HIGH (ref 0.1–1.0)
Monocytes Relative: 7 %
Neutro Abs: 15.7 10*3/uL — ABNORMAL HIGH (ref 1.7–7.7)
Neutrophils Relative %: 87 %
Platelets: 403 10*3/uL — ABNORMAL HIGH (ref 150–400)
RBC: 4.22 MIL/uL (ref 3.87–5.11)
RDW: 14.7 % (ref 11.5–15.5)
WBC: 18.1 10*3/uL — ABNORMAL HIGH (ref 4.0–10.5)
nRBC: 0 % (ref 0.0–0.2)

## 2018-12-18 LAB — MAGNESIUM: Magnesium: 2.1 mg/dL (ref 1.7–2.4)

## 2018-12-18 LAB — GLUCOSE, CAPILLARY
Glucose-Capillary: 417 mg/dL — ABNORMAL HIGH (ref 70–99)
Glucose-Capillary: 191 mg/dL — ABNORMAL HIGH (ref 70–99)
Glucose-Capillary: 137 mg/dL — ABNORMAL HIGH (ref 70–99)
Glucose-Capillary: 367 mg/dL — ABNORMAL HIGH (ref 70–99)
Glucose-Capillary: 349 mg/dL — ABNORMAL HIGH (ref 70–99)
Glucose-Capillary: 390 mg/dL — ABNORMAL HIGH (ref 70–99)
Glucose-Capillary: 338 mg/dL — ABNORMAL HIGH (ref 70–99)

## 2018-12-18 LAB — BASIC METABOLIC PANEL
Anion gap: 11 (ref 5–15)
BUN: 35 mg/dL — ABNORMAL HIGH (ref 8–23)
CO2: 23 mmol/L (ref 22–32)
Calcium: 8.2 mg/dL — ABNORMAL LOW (ref 8.9–10.3)
Chloride: 103 mmol/L (ref 98–111)
Creatinine, Ser: 1.16 mg/dL — ABNORMAL HIGH (ref 0.44–1.00)
GFR calc Af Amer: 53 mL/min — ABNORMAL LOW (ref >60)
GFR calc non Af Amer: 46 mL/min — ABNORMAL LOW (ref >60)
Glucose, Bld: 211 mg/dL — ABNORMAL HIGH (ref 70–99)
Potassium: 4.4 mmol/L (ref 3.5–5.1)
Sodium: 137 mmol/L (ref 135–145)

## 2018-12-18 MED ORDER — INSULIN ASPART 100 UNIT/ML ~~LOC~~ SOLN
0.0000 [IU] | Freq: Every day | SUBCUTANEOUS | Status: DC
  Administered 2018-12-18 – 2018-12-20 (×3): 4 [IU] via SUBCUTANEOUS
  Administered 2018-12-21: 23:00:00 2 [IU] via SUBCUTANEOUS

## 2018-12-18 MED ORDER — ALPRAZOLAM 0.25 MG PO TABS
0.2500 mg | ORAL_TABLET | Freq: Three times a day (TID) | ORAL | Status: DC | PRN
  Administered 2018-12-18 – 2018-12-19 (×4): 0.25 mg via ORAL
  Filled 2018-12-18 (×5): qty 1

## 2018-12-18 MED ORDER — INSULIN ASPART 100 UNIT/ML ~~LOC~~ SOLN
0.0000 [IU] | Freq: Three times a day (TID) | SUBCUTANEOUS | Status: DC
  Administered 2018-12-19: 11 [IU] via SUBCUTANEOUS
  Administered 2018-12-19: 3 [IU] via SUBCUTANEOUS
  Administered 2018-12-20: 12:00:00 5 [IU] via SUBCUTANEOUS
  Administered 2018-12-20: 19:00:00 3 [IU] via SUBCUTANEOUS
  Administered 2018-12-20: 08:00:00 2 [IU] via SUBCUTANEOUS
  Administered 2018-12-21 (×2): 5 [IU] via SUBCUTANEOUS
  Administered 2018-12-21: 3 [IU] via SUBCUTANEOUS
  Administered 2018-12-22: 8 [IU] via SUBCUTANEOUS
  Administered 2018-12-22 (×2): 5 [IU] via SUBCUTANEOUS
  Administered 2018-12-23: 3 [IU] via SUBCUTANEOUS

## 2018-12-18 NOTE — Progress Notes (Signed)
Pt put on commode resulting in BM. Pt also given PRN xanax. States she no longer has chest pain. Will continue to monitor.

## 2018-12-18 NOTE — Progress Notes (Signed)
Pt c/o left sided chest pain. Pt asymptomatic. VSS. Attending MD and cardiologist made aware. No new orders at this time.

## 2018-12-18 NOTE — Progress Notes (Addendum)
PROGRESS NOTE  Leslie Gamble:[id number] DOB: 10/18/1940 DOA: 12/15/2018 PCP: Wenda Low, MD  HPI/Recap of past 24 hours: HPI from Dr Randalyn Rhea is a 78 y.o. female with medical history significant for rheumatoid arthritis with chronic pain, COPD, type 2 diabetes mellitus, hypertension, and anxiety, now presenting to the ED for evaluation of shortness of breath.  Patient reports that she has had intermittent dyspnea for at least 2 months now, felt well when she was discharged from the hospital on 12/01/2018 after undergoing thoracentesis for recurrent right pleural effusion.  She continued to do well up until the past 24 hours when she developed recurrent dyspnea. Pt has undergone thoracentesis twice in just over a month, analysis was consistent with exudate both times, cytology without malignant cells, and cultures were negative.  Patient denies any fevers or night sweats but believes she has been losing weight due to poor appetite but is unable to quantify. In the ED, pt saturating 82% on room air, chest x-ray is concerning for moderate to large right pleural effusion. COVID-19 screening test negative.     Overnight, patient noted to be significantly anxious, with a brief episode of disorientation.  Today, oriented, denies any worsening shortness of breath, worried about possible cancer diagnosis.  This afternoon, complained of chest pain.  Patient scheduled by cardiology to have a stress test in the a.m.  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 Status duo nebs, prednisone, continue inhalers Continue supplemental O2, plan to wean off Patient has an appointment to see oncologist Dr. Earlie Server on 12/20/2018  ?NSTEMI/Takotsubo cardiomyopathy Still having some intermittent left-sided chest pain BNP 302.6 Troponin elevated, currently trending downwards 770-->481 EKG with new T wave inversions in lateral leads Echo with EF of 50 to 74%, grade 2 diastolic dysfunction, septal, apical, inferior apical hypokinesis consistent with Takotsubo DCM, severely elevated pulmonary artery systolic pressure Cardiology consulted, recommended cardiac catheterization, but patient and daughter wants a stress test first, cardiology plan for nuclear stress test on 12/19/2018, if abnormal will proceed with cardiac cath Continue beta-blocker, statins  QTc prolongation QTC in the 500s on EKG Potassium, magnesium within normal limits Held home Remeron for now Repeat EKG in the a.m.  Hypokalemia Replace as needed  Type II DM  Uncontrolled now, likely due to steroids A1c was 6.3% in August 2020  Adjust SSI, Accu-Cheks, hypoglycemic protocol   Hypertension  BP stable Continue Norvasc, Toprol, and losartan as tolerated   Rheumatoid arthritis  Continue leflunomide   Chronic pain Continue home regimen with Cymbalta and as-needed oxycodone   Anxiety disorder  Continue Cymbalta, as-needed Xanax (increased to TID prn), held home Remeron for now due to prolonged QTC           Malnutrition Type:  Malnutrition  Characteristics:      Nutrition Interventions:       Estimated body mass index is 24.77 kg/m as calculated from the following:   Height as of this encounter: 5\' 6"  (1.676 m).   Weight as of this encounter: 69.6 kg.     Code Status: Full  Family Communication: Discussed with Daughter on 12/03/2018  Disposition Plan: To be determined   Consultants:  IR  Cardiology  Procedures:  Thoracentesis on 12/16/2018  Antimicrobials:  None  DVT prophylaxis: Lovenox   Objective: Vitals:   12/18/18 0648 12/18/18 0824 12/18/18 1409 12/18/18 1525  BP: 122/72  117/69 120/62  Pulse: 75 78 71 64  Resp: 16 18 19    Temp: 97.7 F (36.5 C)  98.2 F (36.8 C)   TempSrc: Oral  Oral   SpO2: 97% 96% 94% 95%  Weight:      Height:        Intake/Output Summary (Last 24 hours) at 12/18/2018 1626 Last data filed at 12/18/2018 1300 Gross per 24 hour  Intake 1143 ml  Output 500 ml  Net 643 ml   Filed Weights   12/16/18 0637 12/18/18 0406  Weight: 68.5 kg 69.6 kg    Exam:   General: NAD, chronically ill-appearing  Cardiovascular: S1, S2 present  Respiratory:  Diminished breath sounds bilaterally, worse on the right  Abdomen: Soft, nontender, nondistended, bowel sounds present  Musculoskeletal: No bilateral pedal edema noted  Skin: Normal  Psychiatry: Normal mood given the circumstances, somewhat anxious as well   Data Reviewed: CBC: Recent Labs  Lab 12/16/18 0015 12/04/2018 0225 12/18/18 0313  WBC 14.6* 8.2 18.1*  NEUTROABS  --  7.1 15.7*  HGB 13.0 11.6* 11.8*  HCT 39.7 36.4 36.7  MCV 86.1 86.1 87.0  PLT 385 341 941*   Basic Metabolic Panel: Recent Labs  Lab 12/16/18 0015 11/20/2018 0225 12/18/18 0313  NA 141 141 137  K 3.3* 4.7 4.4  CL 103 105 103  CO2 23 22 23   GLUCOSE 193* 165* 211*  BUN 14 24* 35*  CREATININE 0.81 1.05* 1.16*  CALCIUM 8.9 8.6* 8.2*  MG  --  1.5* 2.1   GFR: Estimated Creatinine Clearance: 38.6 mL/min (A) (by C-G formula based  on SCr of 1.16 mg/dL (H)). Liver Function Tests: Recent Labs  Lab 12/02/2018 0225  AST 22  ALT 10  ALKPHOS 63  BILITOT 0.7  PROT 6.5  ALBUMIN 2.8*   No results for input(s): LIPASE, AMYLASE in the last 168 hours. No results for input(s): AMMONIA in the last 168 hours. Coagulation Profile: No results for input(s): INR, PROTIME in the last 168 hours. Cardiac Enzymes: No results for input(s): CKTOTAL, CKMB, CKMBINDEX, TROPONINI in the last 168 hours. BNP (last 3 results) No results for input(s): PROBNP in the last 8760 hours. HbA1C: No results for input(s): HGBA1C in the last 72 hours. CBG: Recent Labs  Lab 12/18/18 0017 12/18/18 0404 12/18/18 0802 12/18/18 1157 12/18/18 1544  GLUCAP 417* 191* 137* 367* 349*   Lipid Profile: Recent Labs    12/18/18 0313  CHOL 115  HDL 59  LDLCALC 43  TRIG 67  CHOLHDL 1.9   Thyroid Function Tests: No results for input(s): TSH, T4TOTAL, FREET4, T3FREE, THYROIDAB in the last 72 hours. Anemia Panel: No results for input(s): VITAMINB12, FOLATE, FERRITIN, TIBC, IRON, RETICCTPCT in the last 72 hours. Urine analysis:    Component Value Date/Time   COLORURINE YELLOW 11/07/2018 0001   APPEARANCEUR HAZY (A) 11/07/2018 0001  LABSPEC 1.019 11/07/2018 0001   PHURINE 6.0 11/07/2018 0001   GLUCOSEU 50 (A) 11/07/2018 0001   GLUCOSEU NEGATIVE 08/31/2012 0836   HGBUR NEGATIVE 11/07/2018 0001   BILIRUBINUR NEGATIVE 11/07/2018 0001   KETONESUR NEGATIVE 11/07/2018 0001   PROTEINUR >=300 (A) 11/07/2018 0001   UROBILINOGEN 0.2 05/31/2014 1402   NITRITE NEGATIVE 11/07/2018 0001   LEUKOCYTESUR NEGATIVE 11/07/2018 0001   Sepsis Labs: @LABRCNTIP (procalcitonin:4,lacticidven:4)  ) Recent Results (from the past 240 hour(s))  SARS CORONAVIRUS 2 (TAT 6-24 HRS) Nasopharyngeal Nasopharyngeal Swab     Status: None   Collection Time: 12/16/18  4:20 AM   Specimen: Nasopharyngeal Swab  Result Value Ref Range Status   SARS Coronavirus 2 NEGATIVE NEGATIVE  Final    Comment: (NOTE) SARS-CoV-2 target nucleic acids are NOT DETECTED. The SARS-CoV-2 RNA is generally detectable in upper and lower respiratory specimens during the acute phase of infection. Negative results do not preclude SARS-CoV-2 infection, do not rule out co-infections with other pathogens, and should not be used as the sole basis for treatment or other patient management decisions. Negative results must be combined with clinical observations, patient history, and epidemiological information. The expected result is Negative. Fact Sheet for Patients: SugarRoll.be Fact Sheet for Healthcare Providers: https://www.woods-mathews.com/ This test is not yet approved or cleared by the [place] FDA and  has been authorized for detection and/or diagnosis of SARS-CoV-2 by FDA under an Emergency Use Authorization (EUA). This EUA will remain  in effect (meaning this test can be used) for the duration of the COVID-19 declaration under Section 56 4(b)(1) of the Act, 21 U.S.C. section 360bbb-3(b)(1), unless the authorization is terminated or revoked sooner. Performed at [facility], [place] [address], [place], [place] [address]   Gram stain     Status: None   Collection Time: 12/16/18  2:54 PM   Specimen: Lung, Right; Pleural Fluid  Result Value Ref Range Status   Specimen Description FLUID RIGHT PLEURAL  Final   Special Requests NONE  Final   Gram Stain   Final    MODERATE WBC PRESENT, PREDOMINANTLY PMN NO ORGANISMS SEEN Performed at [facility], [place] [address] [place], [place] [address]    Report Status 12/15/2018 FINAL  Final  Culture, body fluid-bottle     Status: None (Preliminary result)   Collection Time: 12/16/18  2:54 PM   Specimen: Fluid  Result Value Ref Range Status   Specimen Description FLUID RIGHT PLEURAL  Final   Special Requests BOTTLES DRAWN AEROBIC AND ANAEROBIC 10CC  Final   Culture   Final    NO  GROWTH 2 DAYS Performed at [facility], [place] [address], [place], [place] [address]    Report Status PENDING  Incomplete      Studies: No results found.  Scheduled Meds: . amLODipine  10 mg Oral Daily  . aspirin EC  81 mg Oral q morning - 10a  . DULoxetine  60 mg Oral BID  . enoxaparin (LOVENOX) injection  40 mg Subcutaneous Q24H  . fluticasone furoate-vilanterol  1 puff Inhalation Daily  . insulin aspart  0-9 Units Subcutaneous Q4H  . ipratropium-albuterol  3 mL Nebulization TID  . leflunomide  20 mg Oral q morning - 10a  . losartan  100 mg Oral q morning - 10a  . mouth rinse  15 mL Mouth Rinse BID  . metoprolol succinate  25 mg Oral Daily  . pantoprazole  40 mg Oral q morning - 10a  .  polyethylene glycol  17 g Oral BID  . predniSONE  40 mg Oral Q breakfast  . rosuvastatin  20 mg Oral q1800  . sodium chloride flush  3 mL Intravenous Q12H  . tetracaine  10 mg Infiltration Once  . tetracaine  90 mg Infiltration Once    Continuous Infusions: . sodium chloride       LOS: 2 days     Alma Friendly, MD [facility]  If 7PM-7AM, please contact night-coverage [email or web address] 12/18/2018, 4:26 PM

## 2018-12-18 NOTE — Progress Notes (Signed)
2000 CBG taken late. At 2217, CBG 477. Blount, NP notified. Novolog 12 units given SQ at 2341. 0000 CBG at 0017 was 417. No insulin given since Novolog 12 units given shortly previous. CBG due again at 0400. Patient was also anxious earlier. Patient had asked for Xanax at about 2100. Patient notified that Hervey Ard could not be given until after at least 2230. Patient called again after 2300, asking for Xanax. Xanax given PO at 2340 per PRN orders. Patient resting in bed at this time, no distress noted. No complaints at this time. Will continue to monitor.

## 2018-12-18 NOTE — Progress Notes (Addendum)
Progress Note  Patient Name: Leslie Gamble Date of Encounter: 12/18/2018  Primary Cardiologist: Kirk Ruths, MD  Subjective   No CP this AM. Does report some orthopnea if she lays all the way back. Overall feeling OK. Remains on O2.  Inpatient Medications    Scheduled Meds: . amLODipine  10 mg Oral Daily  . aspirin EC  81 mg Oral q morning - 10a  . DULoxetine  60 mg Oral BID  . enoxaparin (LOVENOX) injection  40 mg Subcutaneous Q24H  . fluticasone furoate-vilanterol  1 puff Inhalation Daily  . insulin aspart  0-9 Units Subcutaneous Q4H  . ipratropium-albuterol  3 mL Nebulization TID  . leflunomide  20 mg Oral q morning - 10a  . losartan  100 mg Oral q morning - 10a  . mouth rinse  15 mL Mouth Rinse BID  . metoprolol succinate  25 mg Oral Daily  . mirtazapine  30 mg Oral QHS  . pantoprazole  40 mg Oral q morning - 10a  . polyethylene glycol  17 g Oral BID  . predniSONE  40 mg Oral Q breakfast  . rosuvastatin  20 mg Oral q1800  . sodium chloride flush  3 mL Intravenous Q12H  . tetracaine  10 mg Infiltration Once  . tetracaine  90 mg Infiltration Once   Continuous Infusions: . sodium chloride     PRN Meds: sodium chloride, acetaminophen **OR** acetaminophen, albuterol, ALPRAZolam, nitroGLYCERIN, ondansetron **OR** ondansetron (ZOFRAN) IV, oxyCODONE, sodium chloride flush, tetracaine   Vital Signs    Vitals:   12/18/18 0454 12/18/18 0515 12/18/18 0648 12/18/18 0824  BP:   122/72   Pulse:   75 78  Resp:   16 18  Temp:   97.7 F (36.5 C)   TempSrc:   Oral   SpO2: 95% 95% 97% 96%  Weight:      Height:        Intake/Output Summary (Last 24 hours) at 12/18/2018 1000 Last data filed at 12/18/2018 0700 Gross per 24 hour  Intake 903 ml  Output 500 ml  Net 403 ml   Last 3 Weights 12/18/2018 12/16/2018 11/28/2018  Weight (lbs) 153 lb 7 oz 151 lb 1.6 oz 150 lb  Weight (kg) 69.6 kg 68.539 kg 68.04 kg  Some encounter information is confidential and restricted.  Go to Review Flowsheets activity to see all data.     Telemetry    NSR with brief sinus pauses <2sec no acute arrhythmias, QT prolonged - Personally Reviewed  ECG    NSR 72bpm biphasic appearing STT changes in II, avF, V3 with TWI in V4-V6, prolonged QTc of 577ms - Personally Reviewed  Physical Exam   GEN: No acute distress.  HEENT: Normocephalic, atraumatic, sclera non-icteric. Neck: No JVD or bruits. Cardiac: RRR, very soft SEM, no rubs rubs or gallops.  Radials/DP/PT 1+ and equal bilaterally.  Respiratory: Markedly diminished BS R side halfway up, no significant changes on the left. Breathing is unlabored. GI: Soft, nontender, non-distended, BS +x 4. MS: no deformity. Extremities: No clubbing or cyanosis. No edema. Distal pedal pulses are 2+ and equal bilaterally. Neuro:  AAOx3. Follows commands. Psych:  Responds to questions appropriately with a normal affect.  Labs    High Sensitivity Troponin:   Recent Labs  Lab 12/16/18 1253 12/16/18 1721  TROPONINIHS 770* 481*      Cardiac EnzymesNo results for input(s): TROPONINI in the last 168 hours. No results for input(s): TROPIPOC in the last 168 hours.  Chemistry Recent Labs  Lab 12/16/18 0015 12/19/2018 0225 12/18/18 0313  NA 141 141 137  K 3.3* 4.7 4.4  CL 103 105 103  CO2 23 22 23   GLUCOSE 193* 165* 211*  BUN 14 24* 35*  CREATININE 0.81 1.05* 1.16*  CALCIUM 8.9 8.6* 8.2*  PROT  --  6.5  --   ALBUMIN  --  2.8*  --   AST  --  22  --   ALT  --  10  --   ALKPHOS  --  63  --   BILITOT  --  0.7  --   GFRNONAA >60 52* 46*  GFRAA >60 60* 53*  ANIONGAP 15 14 11      Hematology Recent Labs  Lab 12/16/18 0015 12/07/2018 0225 12/18/18 0313  WBC 14.6* 8.2 18.1*  RBC 4.61 4.23 4.22  HGB 13.0 11.6* 11.8*  HCT 39.7 36.4 36.7  MCV 86.1 86.1 87.0  MCH 28.2 27.4 28.0  MCHC 32.7 31.9 32.2  RDW 14.6 14.5 14.7  PLT 385 341 403*    BNP Recent Labs  Lab 12/08/2018 0225  BNP 302.6*     DDimer No results for  input(s): DDIMER in the last 168 hours.   Radiology    Dg Chest 1 View  Result Date: 12/16/2018 CLINICAL DATA:  Post RIGHT thoracentesis EXAM: CHEST  1 VIEW COMPARISON:  Earlier study of 12/16/2018 FINDINGS: Enlargement of cardiac silhouette with slight vascular congestion. Atherosclerotic calcification aorta. Persistent RIGHT pleural effusion and basilar atelectasis, slightly decreased. No pneumothorax following RIGHT thoracentesis. LEFT lung remains clear. Bones demineralized. IMPRESSION: Slightly decreased RIGHT pleural effusion and basilar atelectasis post thoracentesis. No pneumothorax. Enlargement of cardiac silhouette. Electronically Signed   By: Lavonia Dana M.D.   On: 12/16/2018 14:54   Ir Thoracentesis Asp Pleural Space W/img Guide  Result Date: 12/16/2018 INDICATION: Shortness of breath. Right-sided pleural effusion. Request for diagnostic therapeutic thoracentesis. EXAM: ULTRASOUND GUIDED RIGHT THORACENTESIS MEDICATIONS: None. COMPLICATIONS: None immediate. Postprocedural chest x-ray negative for pneumothorax. PROCEDURE: An ultrasound guided thoracentesis was thoroughly discussed with the patient and questions answered. The benefits, risks, alternatives and complications were also discussed. The patient understands and wishes to proceed with the procedure. Written consent was obtained. Ultrasound was performed to localize and mark an adequate pocket of fluid in the right chest. The area was then prepped and draped in the normal sterile fashion. 1% Lidocaine was used for local anesthesia. Under ultrasound guidance a 6 Fr Safe-T-Centesis catheter was introduced. Thoracentesis was performed. Patient complained of significant right-sided chest tightness. At this point the procedure was terminated. The catheter was removed and a dressing applied. FINDINGS: A total of approximately 800 mL of cloudy dark amber fluid was removed. Samples were sent to the laboratory as requested by the clinical team.  IMPRESSION: Successful ultrasound guided right thoracentesis yielding 800 mL of pleural fluid. Read by: Ascencion Dike PA-C Electronically Signed   By: Aletta Edouard M.D.   On: 12/16/2018 14:58    Cardiac Studies   2D echo 12/16/18 1. Left ventricular ejection fraction, by visual estimation, is 50 to 55%. The left ventricle has normal function. Left ventricular septal wall thickness was mildly increased. There is no left ventricular hypertrophy. 2. Left ventricular diastolic parameters are consistent with Grade II diastolic dysfunction (pseudonormalization). 3. 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 4. Global right ventricle has normal systolic function.The right ventricular size is normal. No increase in right ventricular  wall thickness. 5. Left atrial size was normal. 6. Right atrial size was normal. 7. Moderate calcification of the mitral valve leaflet(s). 8. Moderate mitral annular calcification. 9. Moderate thickening of the mitral valve leaflet(s). 10. The mitral valve is normal in structure. Trace mitral valve regurgitation. No evidence of mitral stenosis. 11. The tricuspid valve is normal in structure. Tricuspid valve regurgitation moderate. 12. The aortic valve is tricuspid. Aortic valve regurgitation is not visualized. No evidence of aortic valve sclerosis or stenosis. 13. There is Moderate sclerosis of the aortic valve. 14. There is Moderate thickening of the aortic valve. 15. The pulmonic valve was grossly normal. Pulmonic valve regurgitation is mild. 16. Severely elevated pulmonary artery systolic pressure. 17. The inferior vena cava is normal in size with greater than 50% respiratory variability, suggesting right atrial pressure of 3 mmHg.   Patient Profile     78 y.o. 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 Horris Latino. Troponin elevated to 770. 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 EKG also shows evolving STT changes with QT prolongation.  Assessment & Plan    1. 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 2nd thoracentesis pathology showed adenocarcinoma. Primary malignancy not yet discerned. By exam today it sounds as though fluid has re accumulated. Will defer further management to primary team - question whether she needs to consider something like a PleurX catheter due to frequent reaccumulation. She has oncology appointment pending as outpatient but may be prudent to obtain opinion while inpatient given frequent readmissions.  2. 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 She has had some right sided pleuritic chest pain but not really anginal type chest pain. Her dyspnea has usually resolved between taps. However, given longstanding DM and tobacco abuse, cannot r/o underlying CAD. Chest CT in 10/2018 suggested coronary calcifications. Will clarify plan for ischemic evaluation with MD. This may be challenging to workup in the context of anticipated further workup of her malignancy as well.  3. QT prolongation - evolved since 12/15/18. Mg 1.5 yesterday -> s/p mag supp by primary team. Will add Mg level to today with instructions to nurse call if <2.0 as she may require additional supplementation. This may be due to ischemia or cardiomyopathy. F/u EKG in AM. Remeron can contribute to QT  prolongation so would suggest IM consider alternative. Dr. Horris Latino will review per our Epic chat.  4. AKI - creatinine continues to rise slightly. Baseline around 0.7, now 1.16. Consider adjustment of ARB, already administered today. Will defer management to IM.  5. HLD - lipid profile looks good. Pravastatin titrated to Crestor this admission given DM and coronary/aortic atherosclerosis. If the patient is tolerating statin at time of follow-up appointment, would arrange rechecking liver function/lipid panel in 6-8 weeks.  For questions or updates, please contact [place] Please consult [email or web address] for contact info under Cardiology/STEMI.  Signed, Charlie Pitter, PA-C 12/18/2018, 10:00 AM   As above, patient seen and examined.  She denies chest pain.  She does have some dyspnea.  As outlined previously  her troponin was minimally elevated and echocardiogram with question Takotsubo cardiomyopathy.  However her electrocardiogram shows marked anterior lateral T wave inversion.  Previous CT showed coronary calcifications.  She also has 27 years of diabetes mellitus.  She also may require procedures in the near future for malignancy.  I have recommended cardiac catheterization and discussed this at length both with patient and her daughter by phone.  The risks and benefits including myocardial infarction, CVA and death were discussed.  They are somewhat hesitant.  I also discussed the possibility of a stress nuclear study and if abnormal proceed with catheterization at that point.  They will discuss this and make a final decision.  We can arrange the stress nuclear study tomorrow morning or catheterization pending their decision.  Continue aspirin, statin and beta-blocker.  Note BUN mildly elevated.  Would need gentle hydration prior to catheterization if performed.  Further evaluation per malignant pleural effusion per primary service.  Addendum; pt and daughter have decided they would be agreeable  to [place] nuclear study tomorrow; if abnormal, could proceed with cath at that point. Kirk Ruths   Kirk Ruths, MD

## 2018-12-18 NOTE — Progress Notes (Signed)
   12/18/18 0435  Charting Type  Charting Type Reassessment  Orders Chart Check (once per shift) Completed  Neurological  Neuro (WDL) X  Level of Consciousness Alert  Orientation Level Oriented to person;Oriented to time;Disoriented to place  Cognition Appropriate attention/concentration;Memory impairment;Follows commands  Speech Clear  Pupil Assessment  No  Neuro Symptoms Fatigue;Forgetful  Glasgow Coma Scale  Eye Opening 3  Best Verbal Response (NON-intubated) 4  Best Motor Response 6  Respiratory  Respiratory (WDL) X  Cough None  Respiratory Pattern Regular;Labored;Accessory muscle use  Chest Assessment Chest expansion symmetrical  Bilateral Breath Sounds Expiratory wheezes  ECG Monitoring  ECG Heart Rate 69   This RN woke patient to give insulin and pull patient up in the bed d/t labored breathing and use of accessory muscles. Patient asking where she is, but easily reoriented. O2 had been turned up to 8L HFNC by RRT last night. RRT called for clarification and for patient to receive breathing treatment. O2 slowly decreased with patient maintaining 95-96% on O2 4L HFNC. Respirations have improved, unlabored at this time. Will continue to monitor.

## 2018-12-19 ENCOUNTER — Inpatient Hospital Stay (HOSPITAL_COMMUNITY): Payer: Medicare Other

## 2018-12-19 ENCOUNTER — Other Ambulatory Visit: Payer: Self-pay | Admitting: Medical Oncology

## 2018-12-19 DIAGNOSIS — R0789 Other chest pain: Secondary | ICD-10-CM

## 2018-12-19 DIAGNOSIS — R7989 Other specified abnormal findings of blood chemistry: Secondary | ICD-10-CM

## 2018-12-19 LAB — CBC WITH DIFFERENTIAL/PLATELET
Abs Immature Granulocytes: 0.05 10*3/uL (ref 0.00–0.07)
Basophils Absolute: 0 10*3/uL (ref 0.0–0.1)
Basophils Relative: 0 %
Eosinophils Absolute: 0 10*3/uL (ref 0.0–0.5)
Eosinophils Relative: 0 %
HCT: 33.8 % — ABNORMAL LOW (ref 36.0–46.0)
Hemoglobin: 11.2 g/dL — ABNORMAL LOW (ref 12.0–15.0)
Immature Granulocytes: 0 %
Lymphocytes Relative: 10 %
Lymphs Abs: 1.6 10*3/uL (ref 0.7–4.0)
MCH: 27.9 pg (ref 26.0–34.0)
MCHC: 33.1 g/dL (ref 30.0–36.0)
MCV: 84.3 fL (ref 80.0–100.0)
Monocytes Absolute: 1.2 10*3/uL — ABNORMAL HIGH (ref 0.1–1.0)
Monocytes Relative: 8 %
Neutro Abs: 12.9 10*3/uL — ABNORMAL HIGH (ref 1.7–7.7)
Neutrophils Relative %: 82 %
Platelets: 341 10*3/uL (ref 150–400)
RBC: 4.01 MIL/uL (ref 3.87–5.11)
RDW: 14.6 % (ref 11.5–15.5)
WBC: 15.8 10*3/uL — ABNORMAL HIGH (ref 4.0–10.5)
nRBC: 0 % (ref 0.0–0.2)

## 2018-12-19 LAB — BASIC METABOLIC PANEL
Anion gap: 10 (ref 5–15)
BUN: 28 mg/dL — ABNORMAL HIGH (ref 8–23)
CO2: 24 mmol/L (ref 22–32)
Calcium: 8.2 mg/dL — ABNORMAL LOW (ref 8.9–10.3)
Chloride: 105 mmol/L (ref 98–111)
Creatinine, Ser: 1.02 mg/dL — ABNORMAL HIGH (ref 0.44–1.00)
GFR calc Af Amer: >60 mL/min (ref >60)
GFR calc non Af Amer: 53 mL/min — ABNORMAL LOW (ref >60)
Glucose, Bld: 299 mg/dL — ABNORMAL HIGH (ref 70–99)
Potassium: 4 mmol/L (ref 3.5–5.1)
Sodium: 139 mmol/L (ref 135–145)

## 2018-12-19 LAB — GLUCOSE, CAPILLARY
Glucose-Capillary: 172 mg/dL — ABNORMAL HIGH (ref 70–99)
Glucose-Capillary: 301 mg/dL — ABNORMAL HIGH (ref 70–99)
Glucose-Capillary: 337 mg/dL — ABNORMAL HIGH (ref 70–99)

## 2018-12-19 LAB — NM MYOCAR MULTI W/SPECT W/WALL MOTION / EF
Estimated workload: 1 METS
Exercise duration (min): 5 min
Exercise duration (sec): 17 s
Peak HR: 106 {beats}/min
Rest HR: 79 {beats}/min

## 2018-12-19 LAB — CYTOLOGY - NON PAP

## 2018-12-19 LAB — LEGIONELLA PNEUMOPHILA SEROGP 1 UR AG: L. pneumophila Serogp 1 Ur Ag: NEGATIVE

## 2018-12-19 LAB — MAGNESIUM: Magnesium: 2 mg/dL (ref 1.7–2.4)

## 2018-12-19 MED ORDER — REGADENOSON 0.4 MG/5ML IV SOLN
INTRAVENOUS | Status: AC
  Administered 2018-12-19: 0.4 mg via INTRAVENOUS
  Filled 2018-12-19: qty 5

## 2018-12-19 MED ORDER — TECHNETIUM TC 99M TETROFOSMIN IV KIT
10.0000 | PACK | Freq: Once | INTRAVENOUS | Status: AC | PRN
  Administered 2018-12-19: 08:00:00 10 via INTRAVENOUS

## 2018-12-19 MED ORDER — REGADENOSON 0.4 MG/5ML IV SOLN
0.4000 mg | Freq: Once | INTRAVENOUS | Status: AC
  Administered 2018-12-19: 10:00:00 0.4 mg via INTRAVENOUS
  Filled 2018-12-19: qty 5

## 2018-12-19 MED ORDER — TECHNETIUM TC 99M TETROFOSMIN IV KIT
30.0000 | PACK | Freq: Once | INTRAVENOUS | Status: AC | PRN
  Administered 2018-12-19: 30 via INTRAVENOUS

## 2018-12-19 NOTE — Progress Notes (Signed)
Patient ambulated in hallway with [place], NT with walker and O2 for approximately 200 feet. Patient tolerated fair.

## 2018-12-19 NOTE — Progress Notes (Signed)
   12/19/18 0421  Vitals  Temp 97.6 F (36.4 C)  Temp Source Oral  BP (!) 146/74  MAP (mmHg) 95  BP Location Right Arm  BP Method Automatic  Patient Position (if appropriate) Lying  Pulse Rate 79  Pulse Rate Source Dinamap  Resp 16  Oxygen Therapy  SpO2 94 %  O2 Device HFNC  O2 Flow Rate (L/min) 2 L/min  Pain Assessment  Pain Scale 0-10  Pain Score 8  Pain Type Acute pain  Pain Location Chest  Pain Orientation Right  Pain Descriptors / Indicators Aching  Pain Frequency Occasional  Pain Onset Sudden (Occurred when patient reached with right arm, then went away)  Pain Intervention(s) Emotional support  POSS Scale ([place] Opioid Sedation Scale)  POSS *See Group Information* 1-Acceptable,Awake and alert  MEWS Score  MEWS RR 0  MEWS Pulse 0  MEWS Systolic 0  MEWS LOC 0  MEWS Temp 0  MEWS Score 0  MEWS Score Color Angelena Form, NP notified via text page. This RN will give patient Tylenol and Xanax PO per PRN orders. Will continue to monitor.

## 2018-12-19 NOTE — Progress Notes (Signed)
Lexiscan portion of test complete. Patient in no distress. 

## 2018-12-19 NOTE — Progress Notes (Signed)
Inpatient Diabetes Program Recommendations  AACE/ADA: New Consensus Statement on Inpatient Glycemic Control   Target Ranges:  Prepandial:   less than 140 mg/dL      Peak postprandial:   less than 180 mg/dL (1-2 hours)      Critically ill patients:  140 - 180 mg/dL   Results for Leslie Gamble, Leslie Gamble (MRN [id number]) as of 12/19/2018 11:45  Ref. Range 12/18/2018 08:02 12/18/2018 11:57 12/18/2018 15:44 12/18/2018 16:52 12/18/2018 22:18  Glucose-Capillary Latest Ref Range: 70 - 99 mg/dL 137 (H) 367 (H) 349 (H) 390 (H) 338 (H)   Review of Glycemic Control  Diabetes history: DM2 Outpatient Diabetes medications: Novolog 0-9 units TID with meals, Metformin 1000 mg BID Current orders for Inpatient glycemic control: Novolog 0-15 units TID with meals, Novolog 0-5 units QHS; Prednisone 40 mg QAM  Inpatient Diabetes Program Recommendations:   Insulin - Meal Coverage: If steroids are continued, please consider ordering Novolog 5 units TID with meals for meal coverage if patient eats at least 50% of meals.  Thanks, Barnie Alderman, RN, MSN, CDE Diabetes Coordinator Inpatient Diabetes Program [phone] (Team Pager from 8am to 5pm)

## 2018-12-19 NOTE — Progress Notes (Signed)
PROGRESS NOTE    Leslie Gamble  YNW:[id number] DOB: 19-May-1940 DOA: 12/15/2018 PCP: Wenda Low, MD   Brief Narrative: Leslie Gamble is a 78 y.o. femalewith medical history significant forrheumatoid arthritis with chronic pain, COPD, type 2 diabetes mellitus, hypertension, and anxiety. Patient presented secondary to shortness of breath in setting of recurrent right pleural effusion which is malignant.    Assessment & Plan:   Principal Problem:   Recurrent right pleural effusion Active Problems:   COPD GOLD II   Essential hypertension   Diabetes mellitus type 2, uncontrolled (HCC)   Chronic pain   Hypokalemia   Generalized anxiety disorder   Acute respiratory failure with hypoxia (HCC)   RA (rheumatoid arthritis) (HCC)   Acute respiratory failure Secondary to pleural effusion. 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 Repeat Cytology pending from thoracentesis on 11/27. Patient has not yet established with oncology and had an appointment on 12/1. -Repeat chest x-ray in AM -If fluid re-accumulates too quickly, may need to consider Pleurx catheter.  COPD exacerbation Patient was started on a steroid burst in addition to Duonebs. No wheezing on exam. No tachypnea -Discontinue steroids -Continue Duoneb, Breo ellipta  Diabetes mellitus, type 2 Uncontrolled now in setting of steroids. -Continue SSI  Essential hypertension -Continue losartan, amlodipine and metoprolol  Anxiety -Continue Cymbalta and Xanax  Rheumatoid arthritis -Continue leflunomide   DVT prophylaxis: Lovenox Code Status:   Code Status: Full Code Family Communication: None Disposition Plan: Discharge pending cardiology management and wean to room air if able   Consultants:   Cardiology  Procedures:   11/27: Thoracentesis  11/30: Myoview stress test  Antimicrobials:   None    Subjective: Some dyspnea. Anxious about concern for cancer.  Objective: Vitals:   12/19/18 0946 12/19/18 0947 12/19/18 1118 12/19/18 1353  BP: (!) 172/91 (!) 166/90 (!) 153/80   Pulse:   89   Resp:      Temp:      TempSrc:      SpO2:    94%  Weight:      Height:        Intake/Output Summary (Last 24 hours) at 12/19/2018 1420 Last data filed at 12/19/2018 1240 Gross per 24 hour  Intake 723 ml  Output 1800 ml  Net -1077 ml   Filed Weights   12/16/18 0637 12/18/18 0406  Weight: 68.5 kg 69.6 kg    Examination:  General exam: Appears calm and comfortable Respiratory system: Diminished bilaterally with rales more on right Respiratory effort normal. Cardiovascular system: S1 & S2 heard, RRR. No murmurs, rubs, gallops or clicks. Gastrointestinal system: Abdomen is nondistended, soft and nontender. No organomegaly or masses felt. Normal bowel sounds heard. Central nervous system: Alert and oriented. No focal neurological deficits. Extremities: No edema. No calf tenderness Skin: No cyanosis. No rashes Psychiatry: Judgement and insight appear normal. Mood & affect appropriate.     Data Reviewed: I have personally reviewed following labs and imaging studies  CBC: Recent Labs  Lab 12/16/18 0015 12/16/2018 0225 12/18/18 0313 12/19/18 0304  WBC 14.6* 8.2 18.1* 15.8*  NEUTROABS  --  7.1 15.7* 12.9*  HGB 13.0 11.6* 11.8* 11.2*  HCT 39.7 36.4 36.7 33.8*  MCV 86.1 86.1 87.0 84.3  PLT 385 341 403* 657   Basic Metabolic Panel: Recent Labs  Lab 12/16/18 0015 12/05/2018 0225 12/18/18 0313 12/19/18 0304  NA 141 141 137 139  K 3.3*  4.7 4.4 4.0  CL 103 105 103 105  CO2 23 22 23 24   GLUCOSE 193* 165* 211* 299*  BUN 14 24* 35* 28*  CREATININE 0.81 1.05* 1.16* 1.02*  CALCIUM 8.9 8.6* 8.2* 8.2*  MG  --  1.5* 2.1 2.0   GFR: Estimated Creatinine Clearance: 43.9 mL/min (A) (by C-G formula based on SCr of 1.02 mg/dL (H)). Liver Function Tests: Recent Labs  Lab  12/03/2018 0225  AST 22  ALT 10  ALKPHOS 63  BILITOT 0.7  PROT 6.5  ALBUMIN 2.8*   No results for input(s): LIPASE, AMYLASE in the last 168 hours. No results for input(s): AMMONIA in the last 168 hours. Coagulation Profile: No results for input(s): INR, PROTIME in the last 168 hours. Cardiac Enzymes: No results for input(s): CKTOTAL, CKMB, CKMBINDEX, TROPONINI in the last 168 hours. BNP (last 3 results) No results for input(s): PROBNP in the last 8760 hours. HbA1C: No results for input(s): HGBA1C in the last 72 hours. CBG: Recent Labs  Lab 12/18/18 1157 12/18/18 1544 12/18/18 1652 12/18/18 2218 12/19/18 1217  GLUCAP 367* 349* 390* 338* 172*   Lipid Profile: Recent Labs    12/18/18 0313  CHOL 115  HDL 59  LDLCALC 43  TRIG 67  CHOLHDL 1.9   Thyroid Function Tests: No results for input(s): TSH, T4TOTAL, FREET4, T3FREE, THYROIDAB in the last 72 hours. Anemia Panel: No results for input(s): VITAMINB12, FOLATE, FERRITIN, TIBC, IRON, RETICCTPCT in the last 72 hours. Sepsis Labs: No results for input(s): PROCALCITON, LATICACIDVEN in the last 168 hours.  Recent Results (from the past 240 hour(s))  SARS CORONAVIRUS 2 (TAT 6-24 HRS) Nasopharyngeal Nasopharyngeal Swab     Status: None   Collection Time: 12/16/18  4:20 AM   Specimen: Nasopharyngeal Swab  Result Value Ref Range Status   SARS Coronavirus 2 NEGATIVE NEGATIVE Final    Comment: (NOTE) SARS-CoV-2 target nucleic acids are NOT DETECTED. The SARS-CoV-2 RNA is generally detectable in upper and lower respiratory specimens during the acute phase of infection. Negative results do not preclude SARS-CoV-2 infection, do not rule out co-infections with other pathogens, and should not be used as the sole basis for treatment or other patient management decisions. Negative results must be combined with clinical observations, patient history, and epidemiological information. The expected result is Negative. Fact Sheet for  Patients: SugarRoll.be Fact Sheet for Healthcare Providers: https://www.woods-mathews.com/ This test is not yet approved or cleared by the [place] FDA and  has been authorized for detection and/or diagnosis of SARS-CoV-2 by FDA under an Emergency Use Authorization (EUA). This EUA will remain  in effect (meaning this test can be used) for the duration of the COVID-19 declaration under Section 56 4(b)(1) of the Act, 21 U.S.C. section 360bbb-3(b)(1), unless the authorization is terminated or revoked sooner. Performed at [facility], [place] [address] [place], [place] [address]   Gram stain     Status: None   Collection Time: 12/16/18  2:54 PM   Specimen: Lung, Right; Pleural Fluid  Result Value Ref Range Status   Specimen Description FLUID RIGHT PLEURAL  Final   Special Requests NONE  Final   Gram Stain   Final    MODERATE WBC PRESENT, PREDOMINANTLY PMN NO ORGANISMS SEEN Performed at [facility], [place] [address] [place], [place] [address]    Report Status 11/28/2018 FINAL  Final  Culture, body fluid-bottle     Status: None (Preliminary result)   Collection Time: 12/16/18  2:54 PM  Specimen: Fluid  Result Value Ref Range Status   Specimen Description FLUID RIGHT PLEURAL  Final   Special Requests BOTTLES DRAWN AEROBIC AND ANAEROBIC 10CC  Final   Culture   Final    NO GROWTH 3 DAYS Performed at [facility], [place] [address], [place], [place] [address]    Report Status PENDING  Incomplete  Culture, sputum-assessment     Status: None   Collection Time: 12/18/18  4:47 AM   Specimen: Sputum  Result Value Ref Range Status   Specimen Description SPUTUM  Final   Special Requests NONE  Final   Sputum evaluation   Final    Sputum specimen not acceptable for testing.  Please recollect.   Results Called to: Frankey Poot, RN AT [phone] ON 12/18/18 BY C. JESSUP, MT. Performed at [facility], [place] [address]  [place],  [address]    Report Status 12/18/2018 FINAL  Final         Radiology Studies: Dg Chest Port 1 View  Result Date: 12/19/2018 CLINICAL DATA:  Shortness of breath.  Weakness.  Pleural effusion. EXAM: PORTABLE CHEST 1 VIEW COMPARISON:  12/16/2018. FINDINGS: Stable cardiomegaly. Progressive right pleural effusion. Underlying infiltrate/atelectasis cannot be excluded. No pneumothorax. IMPRESSION: Progressive right pleural effusion. Underlying atelectasis/infiltrate cannot be excluded. Electronically Signed   By: Marcello Moores  Register   On: 12/19/2018 07:51        Scheduled Meds: . amLODipine  10 mg Oral Daily  . aspirin EC  81 mg Oral q morning - 10a  . DULoxetine  60 mg Oral BID  . enoxaparin (LOVENOX) injection  40 mg Subcutaneous Q24H  . fluticasone furoate-vilanterol  1 puff Inhalation Daily  . insulin aspart  0-15 Units Subcutaneous TID WC  . insulin aspart  0-5 Units Subcutaneous QHS  . ipratropium-albuterol  3 mL Nebulization TID  . leflunomide  20 mg Oral q morning - 10a  . losartan  100 mg Oral q morning - 10a  . mouth rinse  15 mL Mouth Rinse BID  . metoprolol succinate  25 mg Oral Daily  . pantoprazole  40 mg Oral q morning - 10a  . polyethylene glycol  17 g Oral BID  . predniSONE  40 mg Oral Q breakfast  . rosuvastatin  20 mg Oral q1800  . sodium chloride flush  3 mL Intravenous Q12H  . tetracaine  10 mg Infiltration Once  . tetracaine  90 mg Infiltration Once   Continuous Infusions: . sodium chloride       LOS: 3 days     Cordelia Poche, MD [facility] 12/19/2018, 2:20 PM  If 7PM-7AM, please contact night-coverage [email or web address]

## 2018-12-19 NOTE — Progress Notes (Addendum)
Progress Note  Patient Name: Leslie Gamble Date of Encounter: 12/19/2018  Primary Cardiologist: Kirk Ruths, MD   Subjective   Feeling well. No chest pain, sob or palpitations. Seen in nuc med.   Inpatient Medications    Scheduled Meds: . amLODipine  10 mg Oral Daily  . aspirin EC  81 mg Oral q morning - 10a  . DULoxetine  60 mg Oral BID  . enoxaparin (LOVENOX) injection  40 mg Subcutaneous Q24H  . fluticasone furoate-vilanterol  1 puff Inhalation Daily  . insulin aspart  0-15 Units Subcutaneous TID WC  . insulin aspart  0-5 Units Subcutaneous QHS  . ipratropium-albuterol  3 mL Nebulization TID  . leflunomide  20 mg Oral q morning - 10a  . losartan  100 mg Oral q morning - 10a  . mouth rinse  15 mL Mouth Rinse BID  . metoprolol succinate  25 mg Oral Daily  . pantoprazole  40 mg Oral q morning - 10a  . polyethylene glycol  17 g Oral BID  . predniSONE  40 mg Oral Q breakfast  . rosuvastatin  20 mg Oral q1800  . sodium chloride flush  3 mL Intravenous Q12H  . tetracaine  10 mg Infiltration Once  . tetracaine  90 mg Infiltration Once   Continuous Infusions: . sodium chloride     PRN Meds: sodium chloride, acetaminophen **OR** acetaminophen, albuterol, ALPRAZolam, nitroGLYCERIN, ondansetron **OR** ondansetron (ZOFRAN) IV, oxyCODONE, sodium chloride flush, tetracaine   Vital Signs    Vitals:   12/19/18 0904 12/19/18 0945 12/19/18 0946 12/19/18 0947  BP: (!) 174/93 (!) 165/111 (!) 172/91 (!) 166/90  Pulse:      Resp:      Temp:      TempSrc:      SpO2:      Weight:      Height:        Intake/Output Summary (Last 24 hours) at 12/19/2018 1010 Last data filed at 12/19/2018 0700 Gross per 24 hour  Intake 963 ml  Output 1500 ml  Net -537 ml   Last 3 Weights 12/18/2018 12/16/2018 11/28/2018  Weight (lbs) 153 lb 7 oz 151 lb 1.6 oz 150 lb  Weight (kg) 69.6 kg 68.539 kg 68.04 kg  Some encounter information is confidential and restricted. Go to Review Flowsheets  activity to see all data.      Telemetry    Unable to review as patient is nuc med.  - Personally Reviewed  ECG  N/A  Physical Exam   GEN: No acute distress.   Neck: No JVD Cardiac: RRR, soft  murmurs, rubs, or gallops.  Respiratory:Diminished breath sound on R base GI: Soft, nontender, non-distended  MS: No edema; No deformity. Neuro:  Nonfocal  Psych: Normal affect   Labs    High Sensitivity Troponin:   Recent Labs  Lab 12/16/18 1253 12/16/18 1721  TROPONINIHS 770* 481*      Chemistry Recent Labs  Lab 12/16/2018 0225 12/18/18 0313 12/19/18 0304  NA 141 137 139  K 4.7 4.4 4.0  CL 105 103 105  CO2 22 23 24   GLUCOSE 165* 211* 299*  BUN 24* 35* 28*  CREATININE 1.05* 1.16* 1.02*  CALCIUM 8.6* 8.2* 8.2*  PROT 6.5  --   --   ALBUMIN 2.8*  --   --   AST 22  --   --   ALT 10  --   --   ALKPHOS 63  --   --  BILITOT 0.7  --   --   GFRNONAA 52* 46* 53*  GFRAA 60* 53* >60  ANIONGAP 14 11 10      Hematology Recent Labs  Lab 12/06/2018 0225 12/18/18 0313 12/19/18 0304  WBC 8.2 18.1* 15.8*  RBC 4.23 4.22 4.01  HGB 11.6* 11.8* 11.2*  HCT 36.4 36.7 33.8*  MCV 86.1 87.0 84.3  MCH 27.4 28.0 27.9  MCHC 31.9 32.2 33.1  RDW 14.5 14.7 14.6  PLT 341 403* 341    BNP Recent Labs  Lab 12/09/2018 0225  BNP 302.6*     Radiology    Dg Chest Port 1 View  Result Date: 12/19/2018 CLINICAL DATA:  Shortness of breath.  Weakness.  Pleural effusion. EXAM: PORTABLE CHEST 1 VIEW COMPARISON:  12/16/2018. FINDINGS: Stable cardiomegaly. Progressive right pleural effusion. Underlying infiltrate/atelectasis cannot be excluded. No pneumothorax. IMPRESSION: Progressive right pleural effusion. Underlying atelectasis/infiltrate cannot be excluded. Electronically Signed   By: Marcello Moores  Register   On: 12/19/2018 07:51    Cardiac Studies   2D echo 12/16/18 1. Left ventricular ejection fraction, by visual estimation, is 50 to 55%. The left ventricle has normal function. Left  ventricular septal wall thickness was mildly increased. There is no left ventricular hypertrophy. 2. Left ventricular diastolic parameters are consistent with Grade II diastolic dysfunction (pseudonormalization). 3. 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 4. Global right ventricle has normal systolic function.The right ventricular size is normal. No increase in right ventricular wall thickness. 5. Left atrial size was normal. 6. Right atrial size was normal. 7. Moderate calcification of the mitral valve leaflet(s). 8. Moderate mitral annular calcification. 9. Moderate thickening of the mitral valve leaflet(s). 10. The mitral valve is normal in structure. Trace mitral valve regurgitation. No evidence of mitral stenosis. 11. The tricuspid valve is normal in structure. Tricuspid valve regurgitation moderate. 12. The aortic valve is tricuspid. Aortic valve regurgitation is not visualized. No evidence of aortic valve sclerosis or stenosis. 13. There is Moderate sclerosis of the aortic valve. 14. There is Moderate thickening of the aortic valve. 15. The pulmonic valve was grossly normal. Pulmonic valve regurgitation is mild. 16. Severely elevated pulmonary artery systolic pressure. 17. The inferior vena cava is normal in size with greater than 50% respiratory variability, suggesting right atrial pressure of 3 mmHg.   Patient Profile       78 y.o. female with longstandingDM, HTN, asthma, depression, rheumatoid arthritis, colon surgery for diverticulitis, former tobacco useand recent recurrent rightpleural effusion with adenocarcinoma by pathology (11/29/18)who is being seen by cardiology for the evaluation ofabnormal echo& elevated troponinat the request ofDr. Ezenduka. Troponin elevated to 770. 2D Echo showed EF 50-55% and grade 2 DD but septal and apical and inferior apical hypokinesis hyperdynamic  basal function findings may be consistent with Takotsubo cardiomyopathy. EKG also shows evolving STT changes with QT prolongation.  Assessment & Plan    1. 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 2nd thoracentesis pathology showed adenocarcinoma. Primary malignancy not yet discerned. Per primary team   2. 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 No chest pain. Pending stress test result.   3. QT prolongation -  - Mg back to normal at 2.1 today. EKG today showed QT/QTc 452/497 ms  4. AKI  - Scr stable today at 1.02  5. HLD - 12/18/2018: Cholesterol 115;  HDL 59; LDL Cholesterol 43; Triglycerides 67; VLDL 13  - Pravastatin titrated to Crestor this admission given DM and coronary/aortic atherosclerosis. If the patient is tolerating statin at time of follow-up appointment, - Consider  rechecking liver function/lipid panel in 6-8 weeks.   For questions or updates, please contact [place] Please consult [email or web address] for contact info under        Signed, Leanor Kail, PA  12/19/2018, 10:10 AM    ---------------------------------------------------------------------------------------------   History and all data above reviewed.  Patient examined.  I agree with the findings as above.  Leslie Gamble is feeling well at this time.  She underwent nuclear stress this morning and results are pending  Constitutional: No acute distress ENMT:, moist mucous membranes Cardiovascular: regular rhythm, normal rate, no murmurs. S1 and S2 normal. Radial pulses normal bilaterally. No jugular venous distention.  Respiratory: Diminished breath sounds right side to mid lung. GI : normal bowel sounds, soft and nontender. No distention.   MSK: extremities warm, well perfused. No edema.  NEURO: grossly nonfocal exam, moves  all extremitieils. PSYCH: alert and oriented x 3, normal mood and affect.   All available labs, radiology testing, previous records reviewed. Agree with documented assessment and plan of my colleague as stated above with the following additions or changes:  Principal Problem:   Recurrent right pleural effusion Active Problems:   COPD GOLD II   Essential hypertension   Diabetes mellitus type 2, uncontrolled (HCC)   Chronic pain   Hypokalemia   Generalized anxiety disorder   Acute respiratory failure with hypoxia (HCC)   RA (rheumatoid arthritis) ([place])    Plan: We will await the results of nuclear stress test for further decision-making.  She remains chest pain-free.  We discussed that stress test may help guide whether coronary angiography is indicated.  We will discuss this tomorrow with the patient and her daughter.  Continue current medications.  Length of Stay:  LOS: 3 days   Elouise Munroe, MD [facility] 1:47 PM  12/19/2018

## 2018-12-19 NOTE — Care Management Important Message (Signed)
Important Message  Patient Details  Name: Leslie Gamble MRN: [id number] Date of Birth: [birth year]   Medicare Important Message Given:  Yes     [name] 12/19/2018, 4:01 PM

## 2018-12-20 ENCOUNTER — Other Ambulatory Visit: Payer: Medicare Other

## 2018-12-20 ENCOUNTER — Encounter: Payer: Self-pay | Admitting: *Deleted

## 2018-12-20 ENCOUNTER — Inpatient Hospital Stay: Payer: Medicare Other | Admitting: Internal Medicine

## 2018-12-20 ENCOUNTER — Inpatient Hospital Stay (HOSPITAL_COMMUNITY): Payer: Medicare Other

## 2018-12-20 ENCOUNTER — Telehealth: Payer: Self-pay | Admitting: Pulmonary Disease

## 2018-12-20 DIAGNOSIS — C801 Malignant (primary) neoplasm, unspecified: Principal | ICD-10-CM

## 2018-12-20 DIAGNOSIS — J90 Pleural effusion, not elsewhere classified: Secondary | ICD-10-CM

## 2018-12-20 DIAGNOSIS — E1165 Type 2 diabetes mellitus with hyperglycemia: Secondary | ICD-10-CM

## 2018-12-20 DIAGNOSIS — M069 Rheumatoid arthritis, unspecified: Secondary | ICD-10-CM

## 2018-12-20 DIAGNOSIS — J438 Other emphysema: Secondary | ICD-10-CM

## 2018-12-20 DIAGNOSIS — J910 Malignant pleural effusion: Secondary | ICD-10-CM

## 2018-12-20 DIAGNOSIS — R778 Other specified abnormalities of plasma proteins: Secondary | ICD-10-CM

## 2018-12-20 LAB — BASIC METABOLIC PANEL
Anion gap: 12 (ref 5–15)
BUN: 19 mg/dL (ref 8–23)
CO2: 22 mmol/L (ref 22–32)
Calcium: 8.2 mg/dL — ABNORMAL LOW (ref 8.9–10.3)
Chloride: 106 mmol/L (ref 98–111)
Creatinine, Ser: 0.7 mg/dL (ref 0.44–1.00)
GFR calc Af Amer: >60 mL/min (ref >60)
GFR calc non Af Amer: >60 mL/min (ref >60)
Glucose, Bld: 197 mg/dL — ABNORMAL HIGH (ref 70–99)
Potassium: 4.2 mmol/L (ref 3.5–5.1)
Sodium: 140 mmol/L (ref 135–145)

## 2018-12-20 LAB — CBC WITH DIFFERENTIAL/PLATELET
Abs Immature Granulocytes: 0.05 10*3/uL (ref 0.00–0.07)
Basophils Absolute: 0 10*3/uL (ref 0.0–0.1)
Basophils Relative: 0 %
Eosinophils Absolute: 0 10*3/uL (ref 0.0–0.5)
Eosinophils Relative: 0 %
HCT: 36.9 % (ref 36.0–46.0)
Hemoglobin: 11.9 g/dL — ABNORMAL LOW (ref 12.0–15.0)
Immature Granulocytes: 0 %
Lymphocytes Relative: 11 %
Lymphs Abs: 1.3 10*3/uL (ref 0.7–4.0)
MCH: 27.7 pg (ref 26.0–34.0)
MCHC: 32.2 g/dL (ref 30.0–36.0)
MCV: 85.8 fL (ref 80.0–100.0)
Monocytes Absolute: 0.9 10*3/uL (ref 0.1–1.0)
Monocytes Relative: 8 %
Neutro Abs: 9.6 10*3/uL — ABNORMAL HIGH (ref 1.7–7.7)
Neutrophils Relative %: 81 %
Platelets: 404 10*3/uL — ABNORMAL HIGH (ref 150–400)
RBC: 4.3 MIL/uL (ref 3.87–5.11)
RDW: 14.6 % (ref 11.5–15.5)
WBC: 11.8 10*3/uL — ABNORMAL HIGH (ref 4.0–10.5)
nRBC: 0 % (ref 0.0–0.2)

## 2018-12-20 LAB — MAGNESIUM: Magnesium: 1.8 mg/dL (ref 1.7–2.4)

## 2018-12-20 LAB — GLUCOSE, CAPILLARY
Glucose-Capillary: 124 mg/dL — ABNORMAL HIGH (ref 70–99)
Glucose-Capillary: 242 mg/dL — ABNORMAL HIGH (ref 70–99)
Glucose-Capillary: 169 mg/dL — ABNORMAL HIGH (ref 70–99)
Glucose-Capillary: 302 mg/dL — ABNORMAL HIGH (ref 70–99)

## 2018-12-20 NOTE — Telephone Encounter (Signed)
PCCM:  Please order home health drainage of pleurex catheter to start at discharge from hospital three times per week.  She and her family will also need education on this.   Please schedule follow up with me in clinic next week for wound check, suture removal.  Or can see Dr. Carlis Abbott or Dr. Tamala Julian next week I am unavailable.   [facility] 12/20/2018 5:02 PM

## 2018-12-20 NOTE — Progress Notes (Signed)
RN went in to walk patient to do ambulatory oxygen sats. Patient request to go the the bathroom, and stat dropped on 2 litter to lower 80's by going to beside commode. It took patient 3 mins to recover to lower 90's on 2 liters.

## 2018-12-20 NOTE — Progress Notes (Addendum)
PROGRESS NOTE    Leslie Gamble  GXQ:[id number] DOB: April 09, 1940 DOA: 12/15/2018 PCP: Wenda Low, MD   Brief Narrative: Leslie Gamble is a 78 y.o. femalewith medical history significant forrheumatoid arthritis with chronic pain, COPD, type 2 diabetes mellitus, hypertension, and anxiety. Patient presented secondary to shortness of breath in setting of recurrent right pleural effusion which is malignant.    Assessment & Plan:   Principal Problem:   Recurrent right pleural effusion Active Problems:   COPD GOLD II   Essential hypertension   Diabetes mellitus type 2, uncontrolled (HCC)   Chronic pain   Hypokalemia   Generalized anxiety disorder   Acute respiratory failure with hypoxia (HCC)   RA (rheumatoid arthritis) (HCC)   Acute respiratory failure Secondary to pleural effusion. Complicated by COPD exacerbation.  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 Repeat Cytology pending from thoracentesis on 11/27. Patient has not yet established with oncology and had an appointment on 12/1. Repeat chest x-ray from 11/30 significant for progressing pleural effusion -Consulting pulmonology for consideration of Pleurx catheter. This was preliminarily discussed with patient and her daughter at bedside -Will consult oncology for recommendations on continued workup if necessary while inpatient  COPD exacerbation Patient was started on a steroid burst in addition to Duonebs. No wheezing on exam. No tachypnea. Steroids discontinued -Continue Duoneb, Breo ellipta  Diabetes mellitus, type 2 Uncontrolled now in setting of steroids. -Continue SSI  Essential hypertension -Continue losartan, amlodipine and metoprolol  Anxiety -Continue Cymbalta and Xanax  Rheumatoid arthritis -Continue leflunomide   DVT prophylaxis: Lovenox Code Status:   Code Status: Full Code Family Communication: Daughter  at bedside Disposition Plan: Discharge pending cardiology recommendations, wean to room air in addition to pulmonology recommendations   Consultants:   Cardiology  Pulmonology  Cardiology  Procedures:   11/27: Thoracentesis  11/30: Myoview stress test  Antimicrobials:  None    Subjective: Some anxiety. Feeling jittery.   Objective: Vitals:   12/20/18 0208 12/20/18 0216 12/20/18 0528 12/20/18 0810  BP:   (!) 163/97   Pulse:   75 84  Resp:    18  Temp:   97.7 F (36.5 C)   TempSrc:   Oral   SpO2: 93% 94% 94% 91%  Weight:      Height:        Intake/Output Summary (Last 24 hours) at 12/20/2018 1326 Last data filed at 12/20/2018 1322 Gross per 24 hour  Intake 833 ml  Output 1050 ml  Net -217 ml   Filed Weights   12/16/18 0637 12/18/18 0406  Weight: 68.5 kg 69.6 kg    Examination:  General exam: Appears calm and comfortable Respiratory system: Clear to auscultation on left with significantly diminished breath sounds on right. Respiratory effort normal. Cardiovascular system: S1 & S2 heard, RRR. No murmurs, rubs, gallops or clicks. Gastrointestinal system: Abdomen is nondistended, soft and nontender. No organomegaly or masses felt. Normal bowel sounds heard. Central nervous system: Alert and oriented. No focal neurological deficits. Extremities: No edema. No calf tenderness Skin: No cyanosis. No rashes Psychiatry: Judgement and insight appear normal. Mood & affect appropriate.     Data Reviewed: I have personally reviewed following labs and imaging studies  CBC: Recent Labs  Lab 12/16/18 0015 12/16/2018 0225 12/18/18 0313 12/19/18 0304 12/20/18 0056  WBC 14.6* 8.2 18.1* 15.8* 11.8*  NEUTROABS  --  7.1 15.7* 12.9* 9.6*  HGB 13.0 11.6* 11.8* 11.2* 11.9*  HCT 39.7 36.4 36.7 33.8* 36.9  MCV 86.1 86.1 87.0 84.3 85.8  PLT 385 341 403* 341 010*   Basic Metabolic Panel: Recent Labs  Lab 12/16/18 0015 11/30/2018 0225 12/18/18 0313 12/19/18 0304  12/20/18 0056  NA 141 141 137 139 140  K 3.3* 4.7 4.4 4.0 4.2  CL 103 105 103 105 106  CO2 23 22 23 24 22   GLUCOSE 193* 165* 211* 299* 197*  BUN 14 24* 35* 28* 19  CREATININE 0.81 1.05* 1.16* 1.02* 0.70  CALCIUM 8.9 8.6* 8.2* 8.2* 8.2*  MG  --  1.5* 2.1 2.0 1.8   GFR: Estimated Creatinine Clearance: 56 mL/min (by C-G formula based on SCr of 0.7 mg/dL). Liver Function Tests: Recent Labs  Lab 11/23/2018 0225  AST 22  ALT 10  ALKPHOS 63  BILITOT 0.7  PROT 6.5  ALBUMIN 2.8*   No results for input(s): LIPASE, AMYLASE in the last 168 hours. No results for input(s): AMMONIA in the last 168 hours. Coagulation Profile: No results for input(s): INR, PROTIME in the last 168 hours. Cardiac Enzymes: No results for input(s): CKTOTAL, CKMB, CKMBINDEX, TROPONINI in the last 168 hours. BNP (last 3 results) No results for input(s): PROBNP in the last 8760 hours. HbA1C: No results for input(s): HGBA1C in the last 72 hours. CBG: Recent Labs  Lab 12/19/18 1217 12/19/18 1657 12/19/18 2137 12/20/18 0757 12/20/18 1201  GLUCAP 172* 301* 337* 124* 242*   Lipid Profile: Recent Labs    12/18/18 0313  CHOL 115  HDL 59  LDLCALC 43  TRIG 67  CHOLHDL 1.9   Thyroid Function Tests: No results for input(s): TSH, T4TOTAL, FREET4, T3FREE, THYROIDAB in the last 72 hours. Anemia Panel: No results for input(s): VITAMINB12, FOLATE, FERRITIN, TIBC, IRON, RETICCTPCT in the last 72 hours. Sepsis Labs: No results for input(s): PROCALCITON, LATICACIDVEN in the last 168 hours.  Recent Results (from the past 240 hour(s))  SARS CORONAVIRUS 2 (TAT 6-24 HRS) Nasopharyngeal Nasopharyngeal Swab     Status: None   Collection Time: 12/16/18  4:20 AM   Specimen: Nasopharyngeal Swab  Result Value Ref Range Status   SARS Coronavirus 2 NEGATIVE NEGATIVE Final    Comment: (NOTE) SARS-CoV-2 target nucleic acids are NOT DETECTED. The SARS-CoV-2 RNA is generally detectable in upper and lower respiratory  specimens during the acute phase of infection. Negative results do not preclude SARS-CoV-2 infection, do not rule out co-infections with other pathogens, and should not be used as the sole basis for treatment or other patient management decisions. Negative results must be combined with clinical observations, patient history, and epidemiological information. The expected result is Negative. Fact Sheet for Patients: SugarRoll.be Fact Sheet for Healthcare Providers: https://www.woods-mathews.com/ This test is not yet approved or cleared by the [place] FDA and  has been authorized for detection and/or diagnosis of SARS-CoV-2 by FDA under an Emergency Use Authorization (EUA). This EUA will remain  in effect (meaning this test can be used) for the duration of the COVID-19 declaration under Section 56 4(b)(1) of the Act, 21 U.S.C. section 360bbb-3(b)(1), unless the authorization is terminated or revoked sooner. Performed at [facility], [place] [address], [place], [place] [address]   Gram stain     Status: None   Collection Time: 12/16/18  2:54 PM   Specimen: Lung, Right; Pleural Fluid  Result Value Ref Range Status   Specimen Description FLUID RIGHT PLEURAL  Final   Special Requests NONE  Final   Gram Stain  Final    MODERATE WBC PRESENT, PREDOMINANTLY PMN NO ORGANISMS SEEN Performed at [facility] [address], [place], [place] [address]    Report Status 12/11/2018 FINAL  Final  Culture, body fluid-bottle     Status: None (Preliminary result)   Collection Time: 12/16/18  2:54 PM   Specimen: Fluid  Result Value Ref Range Status   Specimen Description FLUID RIGHT PLEURAL  Final   Special Requests BOTTLES DRAWN AEROBIC AND ANAEROBIC 10CC  Final   Culture   Final    NO GROWTH 4 DAYS Performed at [facility], [place] [address] [place], [place] [address]    Report Status PENDING  Incomplete  Culture, sputum-assessment      Status: None   Collection Time: 12/18/18  4:47 AM   Specimen: Sputum  Result Value Ref Range Status   Specimen Description SPUTUM  Final   Special Requests NONE  Final   Sputum evaluation   Final    Sputum specimen not acceptable for testing.  Please recollect.   Results Called to: Frankey Poot, RN AT [phone] ON 12/18/18 BY C. JESSUP, MT. Performed at [facility], [place] [address] [place], [place] [address]    Report Status 12/18/2018 FINAL  Final         Radiology Studies: Nm Myocar Multi W/spect W/wall Motion / Ef  Result Date: 12/19/2018  T wave inversion was noted during stress.  Nuclear stress EF: 58%. Mild anteroapical hypokinesis.  Defect 1: There is a medium defect of moderate severity present in the mid anterior, mid anteroseptal and apical anterior location.  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  This is an intermediate risk study based upon infarct pattern.  Candee Furbish, MD  Dg Chest Port 1 View  Result Date: 12/19/2018 CLINICAL DATA:  Shortness of breath.  Weakness.  Pleural effusion. EXAM: PORTABLE CHEST 1 VIEW COMPARISON:  12/16/2018. FINDINGS: Stable cardiomegaly. Progressive right pleural effusion. Underlying infiltrate/atelectasis cannot be excluded. No pneumothorax. IMPRESSION: Progressive right pleural effusion. Underlying atelectasis/infiltrate cannot be excluded. Electronically Signed   By: Marcello Moores  Register   On: 12/19/2018 07:51        Scheduled Meds: . amLODipine  10 mg Oral Daily  . aspirin EC  81 mg Oral q morning - 10a  . DULoxetine  60 mg Oral BID  . enoxaparin (LOVENOX) injection  40 mg Subcutaneous Q24H  . fluticasone furoate-vilanterol  1 puff Inhalation Daily  . insulin aspart  0-15 Units Subcutaneous TID WC  . insulin aspart  0-5 Units Subcutaneous QHS  . ipratropium-albuterol  3 mL Nebulization TID  . leflunomide  20 mg Oral q morning - 10a  . losartan  100 mg Oral q morning - 10a  . mouth rinse  15  mL Mouth Rinse BID  . metoprolol succinate  25 mg Oral Daily  . pantoprazole  40 mg Oral q morning - 10a  . polyethylene glycol  17 g Oral BID  . rosuvastatin  20 mg Oral q1800  . sodium chloride flush  3 mL Intravenous Q12H  . tetracaine  10 mg Infiltration Once  . tetracaine  90 mg Infiltration Once   Continuous Infusions: . sodium chloride       LOS: 4 days     Cordelia Poche, MD [facility] 12/20/2018, 1:26 PM  If 7PM-7AM, please contact night-coverage [email or web address]

## 2018-12-20 NOTE — Progress Notes (Signed)
Oncology Nurse Navigator Documentation  Oncology Nurse Navigator Flowsheets 12/20/2018  Navigator Location [facility]  Navigator Encounter Type Pathology Review;Other:  Patient Visit Type Inpatient/I received a message that pathology is pending on recent thoracentesis.  I do not see the results in EMR, I contacted pathology to check on pathology.    Treatment Phase Abnormal Scans  Barriers/Navigation Needs Coordination of Care  Interventions Coordination of Care  Acuity Level 2-Minimal Needs (1-2 Barriers Identified)  Coordination of Care Other  Time Spent with Patient 30

## 2018-12-20 NOTE — Progress Notes (Signed)
   12/19/18 2340  Mobility  Activity Ambulated in hall  Distance Ambulated (ft) 250 ft  Mobility Response Tolerated well   Patient ambulated with O2 2L HFNC with minimal SOB.

## 2018-12-20 NOTE — Procedures (Addendum)
PleurX Catheter Instertion Procedure Note  Date of Operation: 12/20/2018  Pre-op Diagnosis: Malignant pleural effusion   Post-op Diagnosis: Malignant pleural effusion   Surgeon: Garner Nash  Assistants: Ernest Mallick, DO, Marni Griffon, NP   Anesthesia: Topical, lidocaine only   Meds Given: 1% lidocaine 20cc total  Operation: PleurX catheter insertion on the right thorax   Estimated Blood Loss: <2VZ  Complications: none noted  Indications and History: Leslie Gamble is 78 y.o. with a history of malignant pleural effusion.  Recommendation was to perform Pleurx catheter insertion. The risks, benefits, potential complications, treatment options, and expected outcomes were discussed with the patient.  The possibilities of pneumothorax, infection, or damage to surrounding structures were discussed with the patient who freely signed the consent.    Description of Procedure: The patient was seen in the preoperative area, was examined and was deemed appropriate to proceed.  The patient was taken to short stay room 20, identified as [name] with date of birth 1940/12/30  and MRN [id number], and the procedure verified as Pleurx catheter placement.  A Time Out was held and the above information confirmed.   Conscious sedation was initiated as indicated above.  With the patient in the semirecumbent position, ultrasound was used to locate fluid in the pleural space.  The site was marked and the area thoroughly cleaned with chlorhexidine.  The area was anesthetized with 1% lidocaine, including through subcutaneous tissues to the pleura.  Pleural fluid was withdrawn with the finder needle.  The procedure needle was advanced through the same tract into the pleural fluid, where fluid was again withdrawn.  The guidewire was advanced into the pleural space with subsequent removal of the needle.  A few centimeters anteriorly a skin incision was made, followed by a skin incision by the guidewire.  A  Pleurx catheter attached to a soft tissue dissector was advanced through the anterior incision with tunneling through the subcutaneous tissues to the posterior incision.  The Pleurx catheter was placed with the cuff appropriately about 1 cm from the anterior incision.  The soft tissue dissector was separated from a the Pleurx catheter.  Dilator was advanced over the guidewire into the pleural space and withdrawn.  The guidewire and peel-away catheter were advanced over the guidewire into the pleural space, followed by removal of the dilator and guidewire while leaving the peel-away catheter in place.  The Pleurx catheter was then advanced through the peel-away catheter with the catheter removed in its entirety. The pleurX catheter was connected to a sterile pleurX bottle with successful drainage of pleural fluid. The catheter was secured in place with sutures. The posterior incision was closed with 1 suture. Following successful drainage of 1000cc of pleural fluid, the bottle was removed from the catheter. A sterile cap was placed over the catheter opening and a sterile dressing was applied.   Samples: 1. Pleural fluid 1000cc drained and discarded   Plans:  Home health has been ordered, as well as bottles for home drainage. Follow up in Pulmonary clinic has been arranged for next week with Garner Nash, DO.   [name] ,DO 12/20/2018  4:54 PM [facility]

## 2018-12-20 NOTE — Progress Notes (Signed)
Oncology Nurse Navigator Documentation  Oncology Nurse Navigator Flowsheets 12/20/2018  Navigator Location [facility]  Navigator Encounter Type Pathology Review/I contacted Dr. Tresa Moore with pathology to get an update on histology of recent cytology.  I was updated more staining were done will should be read out by tomorrow. I will update Dr. Julien Nordmann.   Patient Visit Type Inpatient  Treatment Phase Abnormal Scans  Barriers/Navigation Needs Coordination of Care  Interventions Coordination of Care  Acuity Level 2-Minimal Needs (1-2 Barriers Identified)  Coordination of Care Other  Time Spent with Patient 15

## 2018-12-20 NOTE — Progress Notes (Signed)
Patient had pleurX drain placed by Dr Valeta Harms. Consent was obtained & timeout performed. Pre procedure vitals were BP-174/97, HR-84, RR-18, SpO2-97% on 3L [place]. Patient tolerated procedure well. Post vitals are BP-151/83, HR-81, RR-18, SpO2-96% on 3L [place]. Patient will be transported back to room 5W11. Post procedure xray will be performed there.  Ashley Mariner RRT
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
## 2018-12-20 DEATH — deceased

## 2018-12-21 ENCOUNTER — Encounter (HOSPITAL_COMMUNITY): Payer: Self-pay | Admitting: Oncology

## 2018-12-21 ENCOUNTER — Telehealth: Payer: Self-pay | Admitting: Pulmonary Disease

## 2018-12-21 DIAGNOSIS — J449 Chronic obstructive pulmonary disease, unspecified: Secondary | ICD-10-CM

## 2018-12-21 DIAGNOSIS — J9601 Acute respiratory failure with hypoxia: Secondary | ICD-10-CM

## 2018-12-21 DIAGNOSIS — I10 Essential (primary) hypertension: Secondary | ICD-10-CM

## 2018-12-21 DIAGNOSIS — J90 Pleural effusion, not elsewhere classified: Secondary | ICD-10-CM

## 2018-12-21 LAB — BASIC METABOLIC PANEL
Anion gap: 11 (ref 5–15)
BUN: 18 mg/dL (ref 8–23)
CO2: 25 mmol/L (ref 22–32)
Calcium: 8.4 mg/dL — ABNORMAL LOW (ref 8.9–10.3)
Chloride: 105 mmol/L (ref 98–111)
Creatinine, Ser: 0.78 mg/dL (ref 0.44–1.00)
GFR calc Af Amer: >60 mL/min (ref >60)
GFR calc non Af Amer: >60 mL/min (ref >60)
Glucose, Bld: 146 mg/dL — ABNORMAL HIGH (ref 70–99)
Potassium: 3.7 mmol/L (ref 3.5–5.1)
Sodium: 141 mmol/L (ref 135–145)

## 2018-12-21 LAB — CBC WITH DIFFERENTIAL/PLATELET
Abs Immature Granulocytes: 0.09 10*3/uL — ABNORMAL HIGH (ref 0.00–0.07)
Basophils Absolute: 0 10*3/uL (ref 0.0–0.1)
Basophils Relative: 0 %
Eosinophils Absolute: 0 10*3/uL (ref 0.0–0.5)
Eosinophils Relative: 0 %
HCT: 37.9 % (ref 36.0–46.0)
Hemoglobin: 12.2 g/dL (ref 12.0–15.0)
Immature Granulocytes: 1 %
Lymphocytes Relative: 20 %
Lymphs Abs: 2.8 10*3/uL (ref 0.7–4.0)
MCH: 27.9 pg (ref 26.0–34.0)
MCHC: 32.2 g/dL (ref 30.0–36.0)
MCV: 86.7 fL (ref 80.0–100.0)
Monocytes Absolute: 1 10*3/uL (ref 0.1–1.0)
Monocytes Relative: 7 %
Neutro Abs: 10.1 10*3/uL — ABNORMAL HIGH (ref 1.7–7.7)
Neutrophils Relative %: 72 %
Platelets: 393 10*3/uL (ref 150–400)
RBC: 4.37 MIL/uL (ref 3.87–5.11)
RDW: 14.6 % (ref 11.5–15.5)
WBC: 14 10*3/uL — ABNORMAL HIGH (ref 4.0–10.5)
nRBC: 0 % (ref 0.0–0.2)

## 2018-12-21 LAB — CULTURE, BODY FLUID W GRAM STAIN -BOTTLE: Culture: NO GROWTH

## 2018-12-21 LAB — GLUCOSE, CAPILLARY
Glucose-Capillary: 226 mg/dL — ABNORMAL HIGH (ref 70–99)
Glucose-Capillary: 211 mg/dL — ABNORMAL HIGH (ref 70–99)
Glucose-Capillary: 173 mg/dL — ABNORMAL HIGH (ref 70–99)
Glucose-Capillary: 224 mg/dL — ABNORMAL HIGH (ref 70–99)

## 2018-12-21 LAB — CHOLESTEROL, BODY FLUID: Cholesterol, Fluid: 76 mg/dL

## 2018-12-21 LAB — CYTOLOGY - NON PAP

## 2018-12-21 NOTE — Progress Notes (Signed)
Patient ID: Leslie Gamble, female   DOB: 1940-03-24, 78 y.o.   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  Assessment & Plan:   Acute hypoxic respiratory failure -From pleural effusion and COPD exacerbation -Currently still requiring 3 L oxygen via nasal cannula.  Wean off as able.  Might need home oxygen on discharge.  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  Patient has not yet established with oncology as an outpatient and had an appointment on 12/20/2018.  Oncology evaluation pending. -Status post Pleurx catheter placement by PCCM on 12/20/2018.  COPD exacerbation -Continue DuoNeb, Breo Ellipta.  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  Cardiology following.  Spoke to Dr. Acharya/cardiology on phone on 12/21/2018 recommended medical management at this time and follow further oncology recommendations.  Cardiology might consider outpatient cardiac catheterization as well.  Diabetes mellitus type 2 uncontrolled with hyperglycemia -Continue CBGs with SSI.  Add NovoLog 3 units 3 times daily with meals.  Metformin on hold.  Essential hypertension -Continue losartan, amlodipine and metoprolol  Anxiety -Continue Cymbalta and Xanax  Rheumatoid arthritis -Continue leflunomide   DVT prophylaxis: Lovenox Code Status: Full Family Communication: None at bedside Disposition Plan: Home in 1 to 2 days once cleared by  cardiology/pulmonary/oncology  Consultants: Cardiology/pulmonary/oncology  Procedures:  12/16/2018: Thoracentesis  2D echo 12/16/18 1. Left ventricular ejection fraction, by visual estimation, is 50 to 55%. The left ventricle has normal function. Left ventricular septal wall thickness was mildly increased. There is no left ventricular hypertrophy. 2. Left ventricular diastolic parameters are consistent with Grade II diastolic dysfunction (pseudonormalization). 3. 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 4. Global right ventricle has normal systolic function.The right ventricular size is normal. No increase in right ventricular wall thickness. 5. Left atrial size was normal. 6. Right atrial size was normal. 7. Moderate calcification of the mitral valve leaflet(s). 8. Moderate mitral annular calcification. 9. Moderate thickening of the mitral valve leaflet(s). 10. The mitral valve is normal in structure. Trace mitral valve regurgitation. No evidence of mitral stenosis. 11. The tricuspid valve is normal in structure. Tricuspid valve regurgitation moderate. 12. The aortic valve is tricuspid. Aortic valve regurgitation is not visualized. No evidence of aortic valve sclerosis or stenosis. 13. There is Moderate sclerosis of the aortic valve. 14. There is Moderate thickening of the aortic valve. 15. The pulmonic valve was grossly normal. Pulmonic valve regurgitation is mild. 16. Severely elevated pulmonary artery systolic pressure. 17. The inferior vena cava is normal in size with greater than 50% respiratory variability, suggesting right atrial pressure of 3 mmHg.  Right Pleurx catheter placement on 12/20/2018 by PCCM  12/19/2018: Myoview stress test  T wave inversion was noted during stress.  Nuclear stress EF: 58%. Mild anteroapical hypokinesis.  Defect 1: There is a medium defect of moderate  severity present in the mid anterior, mid anteroseptal and apical anterior location.  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  This is an intermediate risk  study based upon infarct pattern.     Antimicrobials:  None   Subjective: Patient seen and examined at bedside.  She complains of pain at the site of Pleurx catheter.  No overnight fever, nausea or vomiting.  Objective: Vitals:   12/20/18 1951 12/20/18 2135 12/21/18 0510 12/21/18 0831  BP:  127/72 112/60   Pulse:  70 77   Resp:  16 16   Temp:  97.6 F (36.4 C) 99 F (37.2 C)   TempSrc:  Oral Oral   SpO2: 94% 93% 93% 94%  Weight:      Height:        Intake/Output Summary (Last 24 hours) at 12/21/2018 1314 Last data filed at 12/21/2018 0900 Gross per 24 hour  Intake 503 ml  Output 200 ml  Net 303 ml   Filed Weights   12/16/18 0637 12/18/18 0406  Weight: 68.5 kg 69.6 kg    Examination:  General exam: Looks chronically ill.  No distress.Marland Kitchen Respiratory system: Bilateral decreased breath sounds at bases with some scattered crackles Cardiovascular system: S1 & S2 heard, Rate controlled Gastrointestinal system: Abdomen is nondistended, soft and nontender. Normal bowel sounds heard. Extremities: No cyanosis, clubbing; trace edema Central nervous system: Alert and oriented. No focal neurological deficits. Moving extremities Skin: No rashes, lesions or ulcers Psychiatry: Flat affect.    Data Reviewed: I have personally reviewed following labs and imaging studies  CBC: Recent Labs  Lab 11/26/2018 0225 12/18/18 0313 12/19/18 0304 12/20/18 0056 12/21/18 0243  WBC 8.2 18.1* 15.8* 11.8* 14.0*  NEUTROABS 7.1 15.7* 12.9* 9.6* 10.1*  HGB 11.6* 11.8* 11.2* 11.9* 12.2  HCT 36.4 36.7 33.8* 36.9 37.9  MCV 86.1 87.0 84.3 85.8 86.7  PLT 341 403* 341 404* 630   Basic Metabolic Panel: Recent Labs  Lab 12/10/2018 0225 12/18/18 0313 12/19/18 0304 12/20/18 0056 12/21/18 0243  NA 141 137 139 140  141  K 4.7 4.4 4.0 4.2 3.7  CL 105 103 105 106 105  CO2 22 23 24 22 25   GLUCOSE 165* 211* 299* 197* 146*  BUN 24* 35* 28* 19 18  CREATININE 1.05* 1.16* 1.02* 0.70 0.78  CALCIUM 8.6* 8.2* 8.2* 8.2* 8.4*  MG 1.5* 2.1 2.0 1.8  --    GFR: Estimated Creatinine Clearance: 56 mL/min (by C-G formula based on SCr of 0.78 mg/dL). Liver Function Tests: Recent Labs  Lab 12/15/2018 0225  AST 22  ALT 10  ALKPHOS 63  BILITOT 0.7  PROT 6.5  ALBUMIN 2.8*   No results for input(s): LIPASE, AMYLASE in the last 168 hours. No results for input(s): AMMONIA in the last 168 hours. Coagulation Profile: No results for input(s): INR, PROTIME in the last 168 hours. Cardiac Enzymes: No results for input(s): CKTOTAL, CKMB, CKMBINDEX, TROPONINI in the last 168 hours. BNP (last 3 results) No results for input(s): PROBNP in the last 8760 hours. HbA1C: No results for input(s): HGBA1C in the last 72 hours. CBG: Recent Labs  Lab 12/20/18 1201 12/20/18 1812 12/20/18 2137 12/21/18 0754 12/21/18 1156  GLUCAP 242* 169* 302* 226* 211*   Lipid Profile: No results for input(s): CHOL, HDL, LDLCALC, TRIG, CHOLHDL, LDLDIRECT in the last 72 hours. Thyroid Function Tests: No results for input(s): TSH, T4TOTAL, FREET4, T3FREE, THYROIDAB in the last 72 hours. Anemia Panel: No results for input(s): VITAMINB12, FOLATE, FERRITIN, TIBC, IRON, RETICCTPCT in the last 72 hours. Sepsis Labs: No results for input(s): PROCALCITON, LATICACIDVEN in the last 168 hours.  Recent Results (from the past 240 hour(s))  SARS CORONAVIRUS 2 (TAT 6-24 HRS) Nasopharyngeal Nasopharyngeal Swab     Status: None   Collection Time: 12/16/18  4:20 AM   Specimen: Nasopharyngeal Swab  Result Value Ref Range Status   SARS Coronavirus 2 NEGATIVE NEGATIVE Final    Comment: (NOTE) SARS-CoV-2 target nucleic acids are NOT DETECTED. The SARS-CoV-2 RNA is generally detectable in upper and lower respiratory specimens during the acute phase of  infection. Negative results do not preclude SARS-CoV-2 infection, do not rule out co-infections with other pathogens, and should not be used as the sole basis for treatment or other patient management decisions. Negative results must be combined with clinical observations, patient history, and epidemiological information. The expected result is Negative. Fact Sheet for Patients: SugarRoll.be Fact Sheet for Healthcare Providers: https://www.woods-mathews.com/ This test is not yet approved or cleared by the [place] FDA and  has been authorized for detection and/or diagnosis of SARS-CoV-2 by FDA under an Emergency Use Authorization (EUA). This EUA will remain  in effect (meaning this test can be used) for the duration of the COVID-19 declaration under Section 56 4(b)(1) of the Act, 21 U.S.C. section 360bbb-3(b)(1), unless the authorization is terminated or revoked sooner. Performed at [facility], [place] [address], [place], [place] [address]   Gram stain     Status: None   Collection Time: 12/16/18  2:54 PM   Specimen: Lung, Right; Pleural Fluid  Result Value Ref Range Status   Specimen Description FLUID RIGHT PLEURAL  Final   Special Requests NONE  Final   Gram Stain   Final    MODERATE WBC PRESENT, PREDOMINANTLY PMN NO ORGANISMS SEEN Performed at [facility], [place] [address], [place], [place] [address]    Report Status 12/10/2018 FINAL  Final  Culture, body fluid-bottle     Status: None   Collection Time: 12/16/18  2:54 PM   Specimen: Fluid  Result Value Ref Range Status   Specimen Description FLUID RIGHT PLEURAL  Final   Special Requests BOTTLES DRAWN AEROBIC AND ANAEROBIC 10CC  Final   Culture   Final    NO GROWTH 5 DAYS Performed at [facility], [place] [address], [place], [place] [address]    Report Status 12/21/2018 FINAL  Final  Culture, sputum-assessment     Status: None   Collection Time: 12/18/18  4:47  AM   Specimen: Sputum  Result Value Ref Range Status   Specimen Description SPUTUM  Final   Special Requests NONE  Final   Sputum evaluation   Final    Sputum specimen not acceptable for testing.  Please recollect.   Results Called to: Frankey Poot, RN AT [phone] ON 12/18/18 BY C. JESSUP, MT. Performed at [facility], [place] [address], [place], [place] [address]    Report Status 12/18/2018 FINAL  Final         Radiology Studies: Dg Chest Port 1 View  Result Date: 12/20/2018 CLINICAL DATA:  Right pleural effusion. Status post insertion of right chest tube. EXAM: PORTABLE CHEST 1 VIEW COMPARISON:  12/19/2018 FINDINGS: Right-sided chest tube is been inserted. The right pleural effusion has diminished and is now moderate. The right chest tube crosses the midline, probably in the anterior clear space. No pneumothorax. Left lung is clear. Heart size and vascularity are normal. Aortic atherosclerosis. No acute bone abnormality. Chronic degenerative changes of the right shoulder. IMPRESSION: 1. Marked decrease in the right pleural effusion after insertion of the right chest tube. No pneumothorax.  I suspect the chest tube is anterior in position. 2. No other change. 3.  Aortic Atherosclerosis (ICD10-I70.0). Electronically Signed   By: Lorriane Shire M.D.   On: 12/20/2018 17:40        Scheduled Meds: . amLODipine  10 mg Oral Daily  . aspirin EC  81 mg Oral q morning - 10a  . DULoxetine  60 mg Oral BID  . enoxaparin (LOVENOX) injection  40 mg Subcutaneous Q24H  . fluticasone furoate-vilanterol  1 puff Inhalation Daily  . insulin aspart  0-15 Units Subcutaneous TID WC  . insulin aspart  0-5 Units Subcutaneous QHS  . ipratropium-albuterol  3 mL Nebulization TID  . leflunomide  20 mg Oral q morning - 10a  . losartan  100 mg Oral q morning - 10a  . mouth rinse  15 mL Mouth Rinse BID  . metoprolol succinate  25 mg Oral Daily  . pantoprazole  40 mg Oral q morning - 10a  . polyethylene glycol   17 g Oral BID  . rosuvastatin  20 mg Oral q1800  . sodium chloride flush  3 mL Intravenous Q12H  . tetracaine  10 mg Infiltration Once  . tetracaine  90 mg Infiltration Once   Continuous Infusions: . sodium chloride            Aline August, MD [facility] 12/21/2018, 1:14 PM
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
## 2018-12-21 NOTE — Telephone Encounter (Signed)
Call returned to [place], confirmed pt info. She wanted to know whether we provide pleurx cath drainage. I made her aware we do not but I will f/u with someone in our office and find out.   She is going to send a packet for care fusion including the order sheet for drainage bottles. I thanked her and made her aware I would make copies and give to all nurses. Voiced understanding.   Will await packet and order sheets.   Will hold message for now.

## 2018-12-21 NOTE — Progress Notes (Addendum)
SATURATION QUALIFICATIONS: (This note is used to comply with regulatory documentation for home oxygen)  Patient Saturations on Room Air at Rest = 85%  Patient Saturations on Room Air while Ambulating = 79%  Patient Saturations on 4 Liters of oxygen while Ambulating = 92%  Please briefly explain why patient needs home oxygen: This patient is unable to maintain oxygen saturation above 90% on room air.  Her oxygen saturation then drops even further during ambulation causing the patient to become weak, tired and short of breath.    The patient is able to maintain an oxygen saturation at, or above, 90% on 2 liters of oxygen when at rest.

## 2018-12-21 NOTE — Consult Note (Signed)
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 defect of moderate severity present in the mid anterior, mid anteroseptal and apical anterior location.  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  This is an intermediate risk study based upon  infarct pattern.  Candee Furbish, MD  Dg Chest Port 1 View  Result Date: 12/20/2018 CLINICAL DATA:  Right pleural effusion. Status post insertion of right chest tube. EXAM: PORTABLE CHEST 1 VIEW COMPARISON:  12/19/2018 FINDINGS: Right-sided chest tube is been inserted. The right pleural effusion has diminished and is now moderate. The right chest tube crosses the midline, probably in the anterior clear space. No pneumothorax. Left lung is clear. Heart size and vascularity are normal. Aortic atherosclerosis. No acute bone abnormality. Chronic degenerative changes of the right shoulder. IMPRESSION: 1. Marked decrease in the right pleural effusion after insertion of the right chest tube. No pneumothorax. I suspect the chest tube is anterior in  position. 2. No other change. 3.  Aortic Atherosclerosis (ICD10-I70.0). Electronically Signed   By: Lorriane Shire M.D.   On: 12/20/2018 17:40   Dg Chest Port 1 View  Result Date: 12/19/2018 CLINICAL DATA:  Shortness of breath.  Weakness.  Pleural effusion. EXAM: PORTABLE CHEST 1 VIEW COMPARISON:  12/16/2018. FINDINGS: Stable cardiomegaly. Progressive right pleural effusion. Underlying infiltrate/atelectasis cannot be excluded. No pneumothorax. IMPRESSION: Progressive right pleural effusion. Underlying atelectasis/infiltrate cannot be excluded. Electronically Signed   By: Marcello Moores  Register   On: 12/19/2018 07:51   Dg Chest Port 1 View  Result Date: 12/01/2018 CLINICAL DATA:  78 year old female with right lower lobe infiltrate. EXAM: PORTABLE CHEST 1 VIEW COMPARISON:  Chest radiograph dated 11/30/2018. FINDINGS: Overall better aeration of the lungs compared to the radiograph of 11/30/2018 and decreased in the right lung base opacity. The left lung remains clear. No large pleural effusion or pneumothorax. The cardiac silhouette is within normal limits. Atherosclerotic calcification of the aorta. There is large amount of stool throughout the colon. No bowel dilatation or evidence of obstruction. Multiple surgical clips over the abdomen and suture line in the pelvis. Degenerative changes of the spine. No acute osseous pathology. IMPRESSION: 1. Overall better aeration of the lungs compared to the radiograph of 11/30/2018 with improved right lung base density. 2. Large amount of stool throughout the colon. No bowel obstruction. Electronically Signed   By: Anner Crete M.D.   On: 12/01/2018 11:39   Dg Abd Portable 1v  Result Date: 12/01/2018 CLINICAL DATA:  78 year old female with right lower lobe infiltrate. EXAM: PORTABLE CHEST 1 VIEW COMPARISON:  Chest radiograph dated 11/30/2018. FINDINGS: Overall better aeration of the lungs compared to the radiograph of 11/30/2018 and decreased in the right lung  base opacity. The left lung remains clear. No large pleural effusion or pneumothorax. The cardiac silhouette is within normal limits. Atherosclerotic calcification of the aorta. There is large amount of stool throughout the colon. No bowel dilatation or evidence of obstruction. Multiple surgical clips over the abdomen and suture line in the pelvis. Degenerative changes of the spine. No acute osseous pathology. IMPRESSION: 1. Overall better aeration of the lungs compared to the radiograph of 11/30/2018 with improved right lung base density. 2. Large amount of stool throughout the colon. No bowel obstruction. Electronically Signed   By: Anner Crete M.D.   On: 12/01/2018 11:39   Ir Thoracentesis Asp Pleural Space W/img Guide  Result Date: 12/16/2018 INDICATION: Shortness of breath. Right-sided pleural effusion. Request for diagnostic therapeutic thoracentesis. EXAM: ULTRASOUND GUIDED RIGHT THORACENTESIS MEDICATIONS: None. COMPLICATIONS: None immediate. Postprocedural chest x-ray negative for pneumothorax. PROCEDURE: An ultrasound guided thoracentesis was thoroughly discussed with the patient and questions answered. The benefits, risks, alternatives and complications were also discussed. The patient understands and wishes to proceed with the procedure.  Written consent was obtained. Ultrasound was performed to localize and mark an adequate pocket of fluid in the right chest. The area was then prepped and draped in the normal sterile fashion. 1% Lidocaine was used for local anesthesia. Under ultrasound guidance a 6 Fr Safe-T-Centesis catheter was introduced. Thoracentesis was performed. Patient complained of significant right-sided chest tightness. At this point the procedure was terminated. The catheter was removed and a dressing applied. FINDINGS: A total of approximately 800 mL of cloudy dark amber fluid was removed. Samples were sent to the laboratory as requested by the clinical team. IMPRESSION: Successful  ultrasound guided right thoracentesis yielding 800 mL of pleural fluid. Read by: Ascencion Dike PA-C Electronically Signed   By: Aletta Edouard M.D.   On: 12/16/2018 14:58   Ir Thoracentesis Asp Pleural Space W/img Guide  Result Date: 11/29/2018 INDICATION: Patient with recurrent symptomatic right-sided pleural effusion. Request for diagnostic and therapeutic right-sided thoracentesis. EXAM: ULTRASOUND GUIDED RIGHT THORACENTESIS MEDICATIONS: 8 mL 1% tetracaine - patient with reported anaphylactic reaction to lidocaine COMPLICATIONS: None immediate. PROCEDURE: An ultrasound guided thoracentesis was thoroughly discussed with the patient and questions answered. The benefits, risks, alternatives and complications were also discussed. The patient understands and wishes to proceed with the procedure. Written consent was obtained. Ultrasound was performed to localize and mark an adequate pocket of fluid in the right chest. The area was then prepped and draped in the normal sterile fashion. 1% Lidocaine was used for local anesthesia. Under ultrasound guidance a 6 Fr Safe-T-Centesis catheter was introduced. Thoracentesis was performed. The catheter was removed and a dressing applied. FINDINGS: A total of approximately 1.7 L of hazy, amber fluid was removed. Samples were sent to the laboratory as requested by the clinical team. IMPRESSION: Successful ultrasound guided right thoracentesis yielding 1.7 L of pleural fluid. Read by Candiss Norse, PA-C No pneumothorax on follow-up chest radiograph. Electronically Signed   By: Lucrezia Europe M.D.   On: 11/29/2018 14:19    Pathology:   CYTOLOGY - NON PAP  * THIS IS AN ADDENDUM REPORT *  CASE: [id number]  PATIENT: [place]  Non-Gynecological Cytology Report   *Addendum * Reason for Addendum #1:  Immunohistochemistry results    Clinical History:  Specimen Submitted: A. PLEURAL FLUID, RIGHT,THORACENTESIS:    FINAL MICROSCOPIC DIAGNOSIS:  - Malignant  cells consistent with adenocarcinoma   SPECIMEN ADEQUACY:  Satisfactory for evaluation   DIAGNOSTIC COMMENTS:  The malignant cells are positive for MOC31. They are negative for  TTF-1, napsin-A, calretinin, and WT-1. Additional studies can be  performed upon clinician request. Dr. Vicente Males has reviewed the case  and concurs with this interpretation.   ADDENDUM:  Additional immunohistochemistry was requested by the clinical team while  the original pathologist was unavailable. By immunohistochemistry, the  neoplastic cells are positive for cytokeratin 7 and EMA but negative for  cytokeratin 20, CDX2, GCDFP, GATA3 and ER. Overall, the immunophenotype  is nonspecific and the differential remains broad; clinical correlation  with imaging studies is recommended.   Assessment and Plan:  1.  Metastatic adenocarcinoma, ? Primary -differentials include lung versus breast versus other 2.  Right malignant pleural effusion -status post Pleurx catheter placement on 12/20/2018 by Dr. Valeta Harms 3.  COPD exacerbation, improved 4.  Diabetes mellitus 5.  Hypertension 6.  Rheumatoid arthritis 7.  Anxiety  -I discussed the pathology findings with the patient.  I recommended for the patient to proceed with a CT of the chest, abdomen, pelvis with contrast to evaluate for  a primary site. -We will review the CT scan results when they are available.  May consider adding tumor markers and possible MRI of the brain depending on findings. -We will plan to see Ms. Runquist as an outpatient at the cancer center to discuss all of the findings and make recommendations regarding treatment.  I will arrange for an appointment once her discharge date is known.  Thank you for this referral.   Mikey Bussing, DNP, AGPCNP-BC, AOCNP

## 2018-12-21 NOTE — Progress Notes (Signed)
Inpatient Diabetes Program Recommendations  AACE/ADA: New Consensus Statement on Inpatient Glycemic Control   Target Ranges:  Prepandial:   less than 140 mg/dL      Peak postprandial:   less than 180 mg/dL (1-2 hours)      Critically ill patients:  140 - 180 mg/dL   Results for Leslie Gamble, Leslie Gamble (MRN [id number]) as of 12/21/2018 08:14  Ref. Range 12/20/2018 07:57 12/20/2018 12:01 12/20/2018 18:12 12/20/2018 21:37 12/21/2018 07:54  Glucose-Capillary Latest Ref Range: 70 - 99 mg/dL 124 (H) 242 (H) 169 (H) 302 (H) 226 (H)   Review of Glycemic Control  Diabetes history: DM2 Outpatient Diabetes medications: Novolog 0-9 units TID with meals, Metformin 1000 mg BID Current orders for Inpatient glycemic control: Novolog 0-15 units TID with meals, Novolog 0-5 units QHS  Inpatient Diabetes Program Recommendations:   Insulin - Meal Coverage:Please consider ordering Novolog 3 units TID with meals for meal coverage if patient eats at least 50% of meals  Thanks, Barnie Alderman, RN, MSN, CDE Diabetes Coordinator Inpatient Diabetes Program [phone] (Team Pager from 8am to 5pm)

## 2018-12-21 NOTE — Telephone Encounter (Signed)
HH order placed. Order sheet for drainage bottles obtained and given to nurse. Nothing further needed at this time.

## 2018-12-21 NOTE — Progress Notes (Signed)
Progress Note  Patient Name: Leslie Gamble Date of Encounter: 12/21/2018  Primary Cardiologist: Kirk Ruths, MD   Subjective   No chest pain currently, sitting comfortably in bed.   Inpatient Medications    Scheduled Meds:  amLODipine  10 mg Oral Daily   aspirin EC  81 mg Oral q morning - 10a   DULoxetine  60 mg Oral BID   enoxaparin (LOVENOX) injection  40 mg Subcutaneous Q24H   fluticasone furoate-vilanterol  1 puff Inhalation Daily   insulin aspart  0-15 Units Subcutaneous TID WC   insulin aspart  0-5 Units Subcutaneous QHS   ipratropium-albuterol  3 mL Nebulization TID   leflunomide  20 mg Oral q morning - 10a   losartan  100 mg Oral q morning - 10a   mouth rinse  15 mL Mouth Rinse BID   metoprolol succinate  25 mg Oral Daily   pantoprazole  40 mg Oral q morning - 10a   polyethylene glycol  17 g Oral BID   rosuvastatin  20 mg Oral q1800   sodium chloride flush  3 mL Intravenous Q12H   tetracaine  10 mg Infiltration Once   tetracaine  90 mg Infiltration Once   Continuous Infusions:  sodium chloride     PRN Meds: sodium chloride, acetaminophen **OR** acetaminophen, albuterol, ALPRAZolam, nitroGLYCERIN, ondansetron **OR** ondansetron (ZOFRAN) IV, oxyCODONE, sodium chloride flush, tetracaine   Vital Signs    Vitals:   12/20/18 1951 12/20/18 2135 12/21/18 0510 12/21/18 0831  BP:  127/72 112/60   Pulse:  70 77   Resp:  16 16   Temp:  97.6 F (36.4 C) 99 F (37.2 C)   TempSrc:  Oral Oral   SpO2: 94% 93% 93% 94%  Weight:      Height:        Intake/Output Summary (Last 24 hours) at 12/21/2018 1331 Last data filed at 12/21/2018 0900 Gross per 24 hour  Intake 243 ml  Output --  Net 243 ml   Last 3 Weights 12/18/2018 12/16/2018 11/28/2018  Weight (lbs) 153 lb 7 oz 151 lb 1.6 oz 150 lb  Weight (kg) 69.6 kg 68.539 kg 68.04 kg  Some encounter information is confidential and restricted. Go to Review Flowsheets activity to see all data.       Telemetry    SR - Personally Reviewed  ECG  No new  Physical Exam   GEN: No acute distress.   Neck: No JVD Cardiac: RRR, soft systolic murmur, rubs, or gallops.  Respiratory: R side diminished breath sounds to mid lung GI: Soft, nontender, non-distended  MS: No edema; No deformity. Neuro:  Nonfocal  Psych: Normal affect   Labs    High Sensitivity Troponin:   Recent Labs  Lab 12/16/18 1253 12/16/18 1721  TROPONINIHS 770* 481*      Chemistry Recent Labs  Lab 12/14/2018 0225  12/19/18 0304 12/20/18 0056 12/21/18 0243  NA 141   < > 139 140 141  K 4.7   < > 4.0 4.2 3.7  CL 105   < > 105 106 105  CO2 22   < > 24 22 25   GLUCOSE 165*   < > 299* 197* 146*  BUN 24*   < > 28* 19 18  CREATININE 1.05*   < > 1.02* 0.70 0.78  CALCIUM 8.6*   < > 8.2* 8.2* 8.4*  PROT 6.5  --   --   --   --   ALBUMIN 2.8*  --   --   --   --  AST 22  --   --   --   --   ALT 10  --   --   --   --   ALKPHOS 63  --   --   --   --   BILITOT 0.7  --   --   --   --   GFRNONAA 52*   < > 53* >60 >60  GFRAA 60*   < > >60 >60 >60  ANIONGAP 14   < > 10 12 11    < > = values in this interval not displayed.     Hematology Recent Labs  Lab 12/19/18 0304 12/20/18 0056 12/21/18 0243  WBC 15.8* 11.8* 14.0*  RBC 4.01 4.30 4.37  HGB 11.2* 11.9* 12.2  HCT 33.8* 36.9 37.9  MCV 84.3 85.8 86.7  MCH 27.9 27.7 27.9  MCHC 33.1 32.2 32.2  RDW 14.6 14.6 14.6  PLT 341 404* 393    BNP Recent Labs  Lab 12/03/2018 0225  BNP 302.6*     DDimer No results for input(s): DDIMER in the last 168 hours.   Radiology    Dg Chest Port 1 View  Result Date: 12/20/2018 CLINICAL DATA:  Right pleural effusion. Status post insertion of right chest tube. EXAM: PORTABLE CHEST 1 VIEW COMPARISON:  12/19/2018 FINDINGS: Right-sided chest tube is been inserted. The right pleural effusion has diminished and is now moderate. The right chest tube crosses the midline, probably in the anterior clear space. No pneumothorax. Left  lung is clear. Heart size and vascularity are normal. Aortic atherosclerosis. No acute bone abnormality. Chronic degenerative changes of the right shoulder. IMPRESSION: 1. Marked decrease in the right pleural effusion after insertion of the right chest tube. No pneumothorax. I suspect the chest tube is anterior in position. 2. No other change. 3.  Aortic Atherosclerosis (ICD10-I70.0). Electronically Signed   By: Lorriane Shire M.D.   On: 12/20/2018 17:40    Cardiac Studies  2D echo 12/16/18 1. Left ventricular ejection fraction, by visual estimation, is 50 to 55%. The left ventricle has normal function. Left ventricular septal wall thickness was mildly increased. There is no left ventricular hypertrophy. 2. Left ventricular diastolic parameters are consistent with Grade II diastolic dysfunction (pseudonormalization). 3. 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 4. Global right ventricle has normal systolic function.The right ventricular size is normal. No increase in right ventricular wall thickness. 5. Left atrial size was normal. 6. Right atrial size was normal. 7. Moderate calcification of the mitral valve leaflet(s). 8. Moderate mitral annular calcification. 9. Moderate thickening of the mitral valve leaflet(s). 10. The mitral valve is normal in structure. Trace mitral valve regurgitation. No evidence of mitral stenosis. 11. The tricuspid valve is normal in structure. Tricuspid valve regurgitation moderate. 12. The aortic valve is tricuspid. Aortic valve regurgitation is not visualized. No evidence of aortic valve sclerosis or stenosis. 13. There is Moderate sclerosis of the aortic valve. 14. There is Moderate thickening of the aortic valve. 15. The pulmonic valve was grossly normal. Pulmonic valve regurgitation is mild. 16. Severely elevated pulmonary artery systolic pressure. 17. The inferior vena  cava is normal in size with greater than 50% respiratory variability, suggesting right atrial pressure of 3 mmHg.    Patient Profile     78 y.o.femalewithlongstandingDM, HTN, asthma, depression, rheumatoid arthritis, colon surgery for diverticulitis, former tobacco useand recent recurrent rightpleural effusion with adenocarcinoma by pathology (11/29/18)who is being [place]  the evaluation ofabnormal echo& elevated troponinat the request ofDr. Ezenduka.Troponin elevated to 770. 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 EKG also shows evolving STT changes with QT prolongation.  Assessment & Plan    1. Malignant pleural effusion - Oncology to see, unknown primary. Per primary service  2. Elevated troponin and fixed perfusion defect on nuc stress - I discussed with the patient that in the setting of an unknown primary malignancy, a strategy of medical management may be advisable currently.  She did have an elevated troponin with a peak of 770 (high-sensitivity troponin).  She did not however have any ischemia demonstrated on nuclear medicine stress.  She has a wall motion abnormality on her echocardiogram which was felt to potentially represent Takotsubo cardiomyopathy.  With a perfusion defect on stress, this may be more suggestive of infarct.  I discussed with the patient that the timeframe of this perfusion defect is unclear at this time, and coronary angiography would be indicated if she were interested.  We a long discussion.  She asked me "do I have cancer", and I explained that this is a discussion best had with oncology though the pleural effusion did demonstrate malignant cells.  She feels that if she has a cancer that takes precedence, and she would not like to have an angiogram "just because we can do it".  I explained to her that the  investigation of the infarction demonstrated on 2 imaging studies would be warranted, however I would be hesitant to commit her to dual antiplatelet therapy in the absence of chest pain if she has upcoming surgeries and procedures anticipated for management of the malignancy.  She verbalized understanding of this conversation.  Fortunately at this time she is on goal-directed medical therapy for coronary artery disease.  She continues on aspirin 81 mg daily, losartan 100 mg daily, and metoprolol succinate 25 mg daily.  She was transition from pravastatin to rosuvastatin at high intensity dosing of 20 mg daily.  She is also on amlodipine 10 mg daily for management of hypertension.  This is an adequate medication regimen.  It would be reasonable to consider coronary angiography or CT coronary angiogram as an outpatient.  The patient and I have participated in shared decision making and feel that since she is relatively symptom-free after thoracentesis, we can pursue further investigation of CAD as an outpatient.  I will await the recommendations of oncology to plan further.     For questions or updates, please contact [place] Please consult [email or web address] for contact info under        Signed, Elouise Munroe, MD  12/21/2018, 1:31 PM

## 2018-12-22 ENCOUNTER — Encounter (HOSPITAL_COMMUNITY): Payer: Self-pay | Admitting: Radiology

## 2018-12-22 ENCOUNTER — Inpatient Hospital Stay (HOSPITAL_COMMUNITY): Payer: Medicare Other

## 2018-12-22 ENCOUNTER — Telehealth: Payer: Self-pay | Admitting: Pulmonary Disease

## 2018-12-22 DIAGNOSIS — R778 Other specified abnormalities of plasma proteins: Secondary | ICD-10-CM

## 2018-12-22 LAB — CBC WITH DIFFERENTIAL/PLATELET
Abs Immature Granulocytes: 0.17 10*3/uL — ABNORMAL HIGH (ref 0.00–0.07)
Basophils Absolute: 0.1 10*3/uL (ref 0.0–0.1)
Basophils Relative: 0 %
Eosinophils Absolute: 0 10*3/uL (ref 0.0–0.5)
Eosinophils Relative: 0 %
HCT: 39.4 % (ref 36.0–46.0)
Hemoglobin: 13 g/dL (ref 12.0–15.0)
Immature Granulocytes: 1 %
Lymphocytes Relative: 15 %
Lymphs Abs: 3.1 10*3/uL (ref 0.7–4.0)
MCH: 27.9 pg (ref 26.0–34.0)
MCHC: 33 g/dL (ref 30.0–36.0)
MCV: 84.5 fL (ref 80.0–100.0)
Monocytes Absolute: 1.3 10*3/uL — ABNORMAL HIGH (ref 0.1–1.0)
Monocytes Relative: 6 %
Neutro Abs: 16.5 10*3/uL — ABNORMAL HIGH (ref 1.7–7.7)
Neutrophils Relative %: 78 %
Platelets: 372 10*3/uL (ref 150–400)
RBC: 4.66 MIL/uL (ref 3.87–5.11)
RDW: 14.8 % (ref 11.5–15.5)
WBC: 21.1 10*3/uL — ABNORMAL HIGH (ref 4.0–10.5)
nRBC: 0 % (ref 0.0–0.2)

## 2018-12-22 LAB — BASIC METABOLIC PANEL
Anion gap: 14 (ref 5–15)
BUN: 28 mg/dL — ABNORMAL HIGH (ref 8–23)
CO2: 21 mmol/L — ABNORMAL LOW (ref 22–32)
Calcium: 7.6 mg/dL — ABNORMAL LOW (ref 8.9–10.3)
Chloride: 96 mmol/L — ABNORMAL LOW (ref 98–111)
Creatinine, Ser: 1.48 mg/dL — ABNORMAL HIGH (ref 0.44–1.00)
GFR calc Af Amer: 39 mL/min — ABNORMAL LOW (ref >60)
GFR calc non Af Amer: 34 mL/min — ABNORMAL LOW (ref >60)
Glucose, Bld: 217 mg/dL — ABNORMAL HIGH (ref 70–99)
Potassium: 4.8 mmol/L (ref 3.5–5.1)
Sodium: 131 mmol/L — ABNORMAL LOW (ref 135–145)

## 2018-12-22 LAB — MAGNESIUM: Magnesium: 1.7 mg/dL (ref 1.7–2.4)

## 2018-12-22 LAB — GLUCOSE, CAPILLARY
Glucose-Capillary: 222 mg/dL — ABNORMAL HIGH (ref 70–99)
Glucose-Capillary: 259 mg/dL — ABNORMAL HIGH (ref 70–99)
Glucose-Capillary: 231 mg/dL — ABNORMAL HIGH (ref 70–99)
Glucose-Capillary: 153 mg/dL — ABNORMAL HIGH (ref 70–99)

## 2018-12-22 MED ORDER — IOHEXOL 300 MG/ML  SOLN
100.0000 mL | Freq: Once | INTRAMUSCULAR | Status: AC | PRN
  Administered 2018-12-22: 100 mL via INTRAVENOUS

## 2018-12-22 MED ORDER — GLUCERNA SHAKE PO LIQD
237.0000 mL | Freq: Three times a day (TID) | ORAL | Status: DC
  Administered 2018-12-22 – 2018-12-23 (×2): 237 mL via ORAL

## 2018-12-22 MED ORDER — INSULIN ASPART 100 UNIT/ML ~~LOC~~ SOLN
3.0000 [IU] | Freq: Three times a day (TID) | SUBCUTANEOUS | Status: DC
  Administered 2018-12-22 – 2018-12-23 (×3): 3 [IU] via SUBCUTANEOUS

## 2018-12-22 NOTE — Telephone Encounter (Signed)
Call returned to [place], she just wanted to make sure I received the order sheets. I made her aware I have them and they have been faxed. Voiced understanding. She also states the packet needs to be mailed. She asked that I call the number and get the address to send to.   Nothing further needed at this time.

## 2018-12-22 NOTE — Progress Notes (Signed)
Initial Nutrition Assessment  RD working remotely.  DOCUMENTATION CODES:   Not applicable  INTERVENTION:   - Glucerna Shake po TID, each supplement provides 220 kcal and 10 grams of protein  - Encourage adequate PO intake  NUTRITION DIAGNOSIS:   Inadequate oral intake related to decreased appetite as evidenced by per patient/family report.  GOAL:   Patient will meet greater than or equal to 90% of their needs  MONITOR:   PO intake, Supplement acceptance, Labs, Weight trends, I & O's  REASON FOR ASSESSMENT:   Malnutrition Screening Tool    ASSESSMENT:   78 year old female who presented to the ED on 11/26 with SOB. PMH of rheumatoid arthritis, COPD, T2DM, HTN, anxiety. X-ray showing recurrent malignant right-sided pleural effusion. Previous cytology showed adenocarcinoma.   11/27 - s/p thoracentesis with 800 ml fluid removed 12/01 - s/p PleurX catheter insertion  Pt will follow up with Oncology outpatient.  Spoke with pt via phone call to room. Pt reports during this admission her appetite has been "better, but not much." Pt endorses having a decreased appetite for the last 4-5 months. Pt states that she also began losing weight about 4-5 months ago.  Pt confirms that she still eats 2 meals daily. Breakfast typically includes cereal and pt may have a meal from [employer] later in the day.  Pt reports her UBW as 170 and is unsure exactly what she weighs now. RD reviewed weight history in chart. Pt with a 5.3 kg weight loss since 09/01/18. This is a 7.1% weight loss which is not quite significant for timeframe but is concerning.  Pt states that she occasionally drinks oral nutrition supplements at home and is willing to consume them during admission. RD to order.  RD discussed the importance of adequate kcal and protein intake in maintaining lean muscle mass and preventing additional weight loss. Pt expresses understanding.  Meal Completion: 50-100% x last 6 recorded  meals  Medications reviewed and include: SSI, Novolog 3 units TID with meals, Protonix, Miralax  Labs reviewed: sodium 131, chloride 96, BUN 28, creatinine 1.48 CBG's: 173-259 x 24 hours  CT: 1000 ml so far today  NUTRITION - FOCUSED PHYSICAL EXAM:  Unable to complete at this time. RD working remotely.  Diet Order:   Diet Order            Diet heart healthy/carb modified Room service appropriate? Yes; Fluid consistency: Thin  Diet effective now              EDUCATION NEEDS:   Education needs have been addressed  Skin:  Skin Assessment: Reviewed RN Assessment  Last BM:  12/21/18  Height:   Ht Readings from Last 1 Encounters:  12/16/18 5\' 6"  (1.676 m)    Weight:   Wt Readings from Last 1 Encounters:  12/18/18 69.6 kg    Ideal Body Weight:  59.1 kg  BMI:  Body mass index is 24.77 kg/m.  Estimated Nutritional Needs:   Kcal:  1500-1700  Protein:  75-90 grams  Fluid:  1.5-1.7 L    Gaynell Face, MS, RD, LDN Inpatient Clinical Dietitian Pager: [phone] Weekend/After Hours: [phone]
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
## 2018-12-22 NOTE — Evaluation (Signed)
Physical Therapy Evaluation Patient Details Name: Leslie Gamble MRN: [id number] DOB: [birth year] Today's Date: 12/22/2018   History of Present Illness  78 year old female with history of rheumatoid arthritis with chronic pain, COPD, diabetes mellitus type 2, hypertension and anxiety presented with worsening shortness of breath and was found to have recurrent right pleural effusion which is malignant. S/p Pleurx catheter placement on 12/20/2018.  Clinical Impression  Pt admitted with above. Presents with decreased functional mobility secondary to generalized weakness, decreased endurance and pain. Ambulating 50 feet with Rollator at a min guard assist level. SpO2 > 90% on 2L O2. Education provided re: increased out of bed mobility, endurance conservation techniques and home set up, use of DME. Recommending HHPT to maximize functional independence.     Follow Up Recommendations Home health PT;Supervision for mobility/OOB    Equipment Recommendations  3in1 (PT)    Recommendations for Other Services OT consult     Precautions / Restrictions Precautions Precautions: Fall;Other (comment) Precaution Comments: R catheter Restrictions Weight Bearing Restrictions: No      Mobility  Bed Mobility Overal bed mobility: Modified Independent             General bed mobility comments: HOB elevated; increased time/effort  Transfers Overall transfer level: Needs assistance Equipment used: 4-wheeled walker Transfers: Sit to/from [facility] Sit to Stand: Min guard Stand pivot transfers: Min guard       General transfer comment: Cues for hand placement, scooting forward to edge of bed, locking Rollator prior to transfer  Ambulation/Gait Ambulation/Gait assistance: Min guard Gait Distance (Feet): 50 Feet Assistive device: 4-wheeled walker Gait Pattern/deviations: Step-through pattern;Decreased stride length;Trunk flexed Gait velocity: decreased   General Gait Details:  Cues for upright posture, improved gait speed with distance  Stairs            Wheelchair Mobility    Modified Rankin (Stroke Patients Only)       Balance Overall balance assessment: Needs assistance Sitting-balance support: Feet supported Sitting balance-Leahy Scale: Good     Standing balance support: Bilateral upper extremity supported;During functional activity Standing balance-Leahy Scale: Fair                               Pertinent Vitals/Pain Pain Assessment: Faces Faces Pain Scale: Hurts even more Pain Location: right flank at catheter placement site Pain Descriptors / Indicators: Grimacing;Guarding Pain Intervention(s): Monitored during session;Limited activity within patient's tolerance;Premedicated before session    [place] expects to be discharged to:: Private residence Living Arrangements: Children(son) Available Help at Discharge: Family;Available PRN/intermittently Type of Home: House Home Access: Stairs to enter Entrance Stairs-Rails: Left Entrance Stairs-Number of Steps: 4 Home Layout: One level Home Equipment: Cane - single point;Shower seat;Grab bars - tub/shower;Walker - 4 wheels      Prior Function Level of Independence: Independent with assistive device(s)         Comments: Pt uses rollator for mobility      Hand Dominance   Dominant Hand: Right    Extremity/Trunk Assessment   Upper Extremity Assessment Upper Extremity Assessment: Overall WFL for tasks assessed    Lower Extremity Assessment Lower Extremity Assessment: Generalized weakness    Cervical / Trunk Assessment Cervical / Trunk Assessment: Kyphotic  Communication   Communication: No difficulties  Cognition Arousal/Alertness: Awake/alert Behavior During Therapy: WFL for tasks assessed/performed Overall Cognitive Status: Within Functional Limits for tasks assessed  General  Comments      Exercises     Assessment/Plan    PT Assessment Patient needs continued PT services  PT Problem List Decreased strength;Decreased activity tolerance;Decreased balance;Decreased mobility;Pain       PT Treatment Interventions DME instruction;Gait training;Functional mobility training;Therapeutic activities;Therapeutic exercise;Balance training;Patient/family education    PT Goals (Current goals can be found in the Care Plan section)  Acute Rehab PT Goals Patient Stated Goal: "to rest." PT Goal Formulation: With patient Time For Goal Achievement: 01/05/19 Potential to Achieve Goals: Fair    Frequency Min 3X/week   Barriers to discharge        Co-evaluation               AM-PAC PT "6 Clicks" Mobility  Outcome Measure Help needed turning from your back to your side while in a flat bed without using bedrails?: None Help needed moving from lying on your back to sitting on the side of a flat bed without using bedrails?: None Help needed moving to and from a bed to a chair (including a wheelchair)?: A Little Help needed standing up from a chair using your arms (e.g., wheelchair or bedside chair)?: A Little Help needed to walk in hospital room?: A Little Help needed climbing 3-5 steps with a railing? : A Lot 6 Click Score: 19    End of Session Equipment Utilized During Treatment: Gait belt Activity Tolerance: Patient tolerated treatment well Patient left: in bed;with call bell/phone within reach;with bed alarm set Nurse Communication: Mobility status PT Visit Diagnosis: Unsteadiness on feet (R26.81);Muscle weakness (generalized) (M62.81);Difficulty in walking, not elsewhere classified (R26.2);Pain Pain - Right/Left: Right Pain - part of body: (flank)    Time: 2355-7322 PT Time Calculation (min) (ACUTE ONLY): 28 min   Charges:   PT Evaluation $PT Eval Moderate Complexity: 1 Mod PT Treatments $Gait Training: 8-22 mins        Ellamae Sia, PT,  DPT Acute Rehabilitation Services Pager [phone] Office [phone]   Willy Eddy 12/22/2018, 10:50 AM

## 2018-12-22 NOTE — Progress Notes (Signed)
Telemetry discontinued per MD order.

## 2018-12-22 NOTE — Progress Notes (Signed)
Progress Note  Patient Name: Leslie Gamble Date of Encounter: 12/22/2018  Primary Cardiologist: Kirk Ruths, MD    Subjective   I spoke to the patient and her daughter today regarding management of an abnormal echocardiogram with wall motion abnormalities and a stress test demonstrating infarction in the setting of malignant pleural effusion with unknown primary.  We discussed intervention versus medical management.  The patient tells me "I do not want to have the cath" in regards to coronary angiography.  Inpatient Medications    Scheduled Meds:  amLODipine  10 mg Oral Daily   aspirin EC  81 mg Oral q morning - 10a   DULoxetine  60 mg Oral BID   enoxaparin (LOVENOX) injection  40 mg Subcutaneous Q24H   feeding supplement (GLUCERNA SHAKE)  237 mL Oral TID BM   fluticasone furoate-vilanterol  1 puff Inhalation Daily   insulin aspart  0-15 Units Subcutaneous TID WC   insulin aspart  0-5 Units Subcutaneous QHS   insulin aspart  3 Units Subcutaneous TID WC   ipratropium-albuterol  3 mL Nebulization TID   leflunomide  20 mg Oral q morning - 10a   losartan  100 mg Oral q morning - 10a   mouth rinse  15 mL Mouth Rinse BID   metoprolol succinate  25 mg Oral Daily   pantoprazole  40 mg Oral q morning - 10a   polyethylene glycol  17 g Oral BID   rosuvastatin  20 mg Oral q1800   sodium chloride flush  3 mL Intravenous Q12H   tetracaine  10 mg Infiltration Once   tetracaine  90 mg Infiltration Once   Continuous Infusions:  sodium chloride     PRN Meds: sodium chloride, acetaminophen **OR** acetaminophen, albuterol, ALPRAZolam, nitroGLYCERIN, ondansetron **OR** ondansetron (ZOFRAN) IV, oxyCODONE, sodium chloride flush, tetracaine   Vital Signs    Vitals:   12/21/18 2013 12/21/18 2215 12/22/18 0722 12/22/18 1341  BP:  121/77 136/89   Pulse: 81 86 86 86  Resp: 16 (!) 21 20 18   Temp:  98.3 F (36.8 C) 98.5 F (36.9 C)   TempSrc:  Oral Oral   SpO2: 92%   100% 97%  Weight:      Height:        Intake/Output Summary (Last 24 hours) at 12/22/2018 1520 Last data filed at 12/22/2018 1500 Gross per 24 hour  Intake 720 ml  Output 1250 ml  Net -530 ml   Last 3 Weights 12/18/2018 12/16/2018 11/28/2018  Weight (lbs) 153 lb 7 oz 151 lb 1.6 oz 150 lb  Weight (kg) 69.6 kg 68.539 kg 68.04 kg  Some encounter information is confidential and restricted. Go to Review Flowsheets activity to see all data.      Telemetry    No tele to review - Personally Reviewed  ECG    No new  Physical Exam   GEN: No acute distress.  Neck: No JVD Cardiac: RRR, no murmurs, rubs, or gallops.  Respiratory: right pleurx catheter, decreased bilateral basilar breath sounds GI: Soft, nontender, non-distended  MS: Trace edema; No deformity. Neuro:  Nonfocal  Psych: Normal affect   Labs    High Sensitivity Troponin:   Recent Labs  Lab 12/16/18 1253 12/16/18 1721  TROPONINIHS 770* 481*      Chemistry Recent Labs  Lab 12/09/2018 0225  12/20/18 0056 12/21/18 0243 12/22/18 0313  NA 141   < > 140 141 131*  K 4.7   < > 4.2 3.7 4.8  CL 105   < > 106 105 96*  CO2 22   < > 22 25 21*  GLUCOSE 165*   < > 197* 146* 217*  BUN 24*   < > 19 18 28*  CREATININE 1.05*   < > 0.70 0.78 1.48*  CALCIUM 8.6*   < > 8.2* 8.4* 7.6*  PROT 6.5  --   --   --   --   ALBUMIN 2.8*  --   --   --   --   AST 22  --   --   --   --   ALT 10  --   --   --   --   ALKPHOS 63  --   --   --   --   BILITOT 0.7  --   --   --   --   GFRNONAA 52*   < > >60 >60 34*  GFRAA 60*   < > >60 >60 39*  ANIONGAP 14   < > 12 11 14    < > = values in this interval not displayed.     Hematology Recent Labs  Lab 12/20/18 0056 12/21/18 0243 12/22/18 0313  WBC 11.8* 14.0* 21.1*  RBC 4.30 4.37 4.66  HGB 11.9* 12.2 13.0  HCT 36.9 37.9 39.4  MCV 85.8 86.7 84.5  MCH 27.7 27.9 27.9  MCHC 32.2 32.2 33.0  RDW 14.6 14.6 14.8  PLT 404* 393 372    BNP Recent Labs  Lab 11/28/2018 0225  BNP 302.6*       DDimer No results for input(s): DDIMER in the last 168 hours.   Radiology    Ct Chest W Contrast  Result Date: 12/22/2018 CLINICAL DATA:  Malignant right pleural effusion. Evaluate for primary neoplasm. EXAM: CT CHEST, ABDOMEN, AND PELVIS WITH CONTRAST TECHNIQUE: Multidetector CT imaging of the chest, abdomen and pelvis was performed following the standard protocol during bolus administration of intravenous contrast. CONTRAST:  198mL OMNIPAQUE IOHEXOL 300 MG/ML  SOLN COMPARISON:  Chest CT 11/10/2018 FINDINGS: CT CHEST FINDINGS Cardiovascular: The heart is normal in size and stable. No pericardial effusion. There is mild tortuosity and moderate calcification of the thoracic aorta but no aneurysm or dissection. The branch vessels are patent. Stable fairly extensive three-vessel coronary artery calcifications. Mediastinum/Nodes: Slight interval enlargement of a precarinal lymph node which measures 8 mm on image number 23. There is a 7 mm left-sided subcarinal node adjacent to the esophagus. The esophagus is grossly normal. Lungs/Pleura: Persistent large left pleural effusion with complete left lower lobe atelectasis. No obvious lung masses identified. The compressed lung tissue is slightly irregular. There are several enhancing right-sided pleural nodules suggesting pleural metastatic disease. A PleurX drainage catheter is in place. Stable underlying emphysematous changes and areas of pulmonary scarring. No worrisome pulmonary lesion is identified. No metastatic pulmonary nodules. Musculoskeletal: No significant bony findings. No supraclavicular or axillary adenopathy. CT ABDOMEN PELVIS FINDINGS Hepatobiliary: No focal hepatic lesions or intrahepatic biliary dilatation. The gallbladder appears normal. No common bile duct dilatation. The portal and hepatic veins are patent. Pancreas: Moderate fatty atrophy of the pancreas but no mass, inflammation or ductal dilatation. Spleen: Normal size.  No focal  lesions. Adrenals/Urinary Tract: The adrenal glands are unremarkable. No adrenal gland lesions are demonstrated. Patchy areas of decreased perfusion involving the left kidney greater than right. Findings could suggest pyelonephritis. Recommend correlation with clinical findings and urinalysis. No worrisome renal lesions or hydronephrosis. Simple appearing right renal cyst. The bladder is unremarkable.  Stomach/Bowel: The stomach, duodenum, small bowel and colon are unremarkable. No acute inflammatory process, mass lesions or obstructive findings. The terminal ileum and appendix are normal. There are surgical changes involving the lower sigmoid colon and upper sigmoid colon along with other scattered surgical clips in the left abdomen and pelvis. Moderate stool in the colon could suggest constipation. Vascular/Lymphatic: Moderate to advanced atherosclerotic calcifications involving the aorta and branch vessel ostia and iliac arteries but no aneurysm or dissection. The major venous structures are patent. No mesenteric or retroperitoneal mass or adenopathy. Reproductive: The uterus and ovaries are unremarkable. Other: No pelvic mass or adenopathy. No free pelvic fluid collections. No inguinal mass or adenopathy. No abdominal wall hernia or subcutaneous lesions. There is diffuse inflammatory change/edema or inflammation involving the right chest wall and right flank area which may reflect cellulitis. It is possible this is related to the PleurX drainage catheter. Musculoskeletal: No significant bony findings. No worrisome bone lesions. IMPRESSION: 1. Persistent large right pleural effusion and despite right-sided PleurX drainage catheter. 2. Numerous enhancing small pleural nodules suggesting pleural metastatic disease. 3. Complete compressive atelectasis of the right lower lobe without obvious lung mass. 4. Emphysematous changes and pulmonary scarring but no worrisome pulmonary lesions or pulmonary nodules. 5. No  findings to suggest metastatic disease involving the abdomen/pelvis. 6. Evidence of prior colon surgery but no colon mass. 7. Inflammatory/interstitial changes involving the entire right chest wall and right abdominal flank area suggesting cellulitis and possibly related to the PleurX drainage catheter. 8. Findings suspicious for bilateral pyelonephritis, left greater than right. Recommend correlation with clinical findings and urinalysis. Electronically Signed   By: Marijo Sanes M.D.   On: 12/22/2018 08:10   Ct Abdomen Pelvis W Contrast  Result Date: 12/22/2018 CLINICAL DATA:  Malignant right pleural effusion. Evaluate for primary neoplasm. EXAM: CT CHEST, ABDOMEN, AND PELVIS WITH CONTRAST TECHNIQUE: Multidetector CT imaging of the chest, abdomen and pelvis was performed following the standard protocol during bolus administration of intravenous contrast. CONTRAST:  144mL OMNIPAQUE IOHEXOL 300 MG/ML  SOLN COMPARISON:  Chest CT 11/10/2018 FINDINGS: CT CHEST FINDINGS Cardiovascular: The heart is normal in size and stable. No pericardial effusion. There is mild tortuosity and moderate calcification of the thoracic aorta but no aneurysm or dissection. The branch vessels are patent. Stable fairly extensive three-vessel coronary artery calcifications. Mediastinum/Nodes: Slight interval enlargement of a precarinal lymph node which measures 8 mm on image number 23. There is a 7 mm left-sided subcarinal node adjacent to the esophagus. The esophagus is grossly normal. Lungs/Pleura: Persistent large left pleural effusion with complete left lower lobe atelectasis. No obvious lung masses identified. The compressed lung tissue is slightly irregular. There are several enhancing right-sided pleural nodules suggesting pleural metastatic disease. A PleurX drainage catheter is in place. Stable underlying emphysematous changes and areas of pulmonary scarring. No worrisome pulmonary lesion is identified. No metastatic pulmonary  nodules. Musculoskeletal: No significant bony findings. No supraclavicular or axillary adenopathy. CT ABDOMEN PELVIS FINDINGS Hepatobiliary: No focal hepatic lesions or intrahepatic biliary dilatation. The gallbladder appears normal. No common bile duct dilatation. The portal and hepatic veins are patent. Pancreas: Moderate fatty atrophy of the pancreas but no mass, inflammation or ductal dilatation. Spleen: Normal size.  No focal lesions. Adrenals/Urinary Tract: The adrenal glands are unremarkable. No adrenal gland lesions are demonstrated. Patchy areas of decreased perfusion involving the left kidney greater than right. Findings could suggest pyelonephritis. Recommend correlation with clinical findings and urinalysis. No worrisome renal lesions or hydronephrosis. Simple appearing right  renal cyst. The bladder is unremarkable. Stomach/Bowel: The stomach, duodenum, small bowel and colon are unremarkable. No acute inflammatory process, mass lesions or obstructive findings. The terminal ileum and appendix are normal. There are surgical changes involving the lower sigmoid colon and upper sigmoid colon along with other scattered surgical clips in the left abdomen and pelvis. Moderate stool in the colon could suggest constipation. Vascular/Lymphatic: Moderate to advanced atherosclerotic calcifications involving the aorta and branch vessel ostia and iliac arteries but no aneurysm or dissection. The major venous structures are patent. No mesenteric or retroperitoneal mass or adenopathy. Reproductive: The uterus and ovaries are unremarkable. Other: No pelvic mass or adenopathy. No free pelvic fluid collections. No inguinal mass or adenopathy. No abdominal wall hernia or subcutaneous lesions. There is diffuse inflammatory change/edema or inflammation involving the right chest wall and right flank area which may reflect cellulitis. It is possible this is related to the PleurX drainage catheter. Musculoskeletal: No significant  bony findings. No worrisome bone lesions. IMPRESSION: 1. Persistent large right pleural effusion and despite right-sided PleurX drainage catheter. 2. Numerous enhancing small pleural nodules suggesting pleural metastatic disease. 3. Complete compressive atelectasis of the right lower lobe without obvious lung mass. 4. Emphysematous changes and pulmonary scarring but no worrisome pulmonary lesions or pulmonary nodules. 5. No findings to suggest metastatic disease involving the abdomen/pelvis. 6. Evidence of prior colon surgery but no colon mass. 7. Inflammatory/interstitial changes involving the entire right chest wall and right abdominal flank area suggesting cellulitis and possibly related to the PleurX drainage catheter. 8. Findings suspicious for bilateral pyelonephritis, left greater than right. Recommend correlation with clinical findings and urinalysis. Electronically Signed   By: Marijo Sanes M.D.   On: 12/22/2018 08:10   Dg Chest Port 1 View  Result Date: 12/22/2018 CLINICAL DATA:  Pleural effusion, shortness of breath, history asthma, hypertension, type II diabetes mellitus EXAM: PORTABLE CHEST 1 VIEW COMPARISON:  Portable exam 0628 hours compared to 12/20/2018 FINDINGS: Normal heart size, mediastinal contours, and pulmonary vascularity. Layered RIGHT pleural effusion with asymmetric opacity of the entirety of the RIGHT hemithorax. Bibasilar atelectasis. No acute infiltrate or pneumothorax. Atherosclerotic calcification aorta. Question BILATERAL chronic rotator cuff tears. IMPRESSION: Layered RIGHT pleural effusion and bibasilar atelectasis. Aortic Atherosclerosis (ICD10-I70.0). Electronically Signed   By: Lavonia Dana M.D.   On: 12/22/2018 08:51   Dg Chest Port 1 View  Result Date: 12/20/2018 CLINICAL DATA:  Right pleural effusion. Status post insertion of right chest tube. EXAM: PORTABLE CHEST 1 VIEW COMPARISON:  12/19/2018 FINDINGS: Right-sided chest tube is been inserted. The right pleural  effusion has diminished and is now moderate. The right chest tube crosses the midline, probably in the anterior clear space. No pneumothorax. Left lung is clear. Heart size and vascularity are normal. Aortic atherosclerosis. No acute bone abnormality. Chronic degenerative changes of the right shoulder. IMPRESSION: 1. Marked decrease in the right pleural effusion after insertion of the right chest tube. No pneumothorax. I suspect the chest tube is anterior in position. 2. No other change. 3.  Aortic Atherosclerosis (ICD10-I70.0). Electronically Signed   By: Lorriane Shire M.D.   On: 12/20/2018 17:40    Cardiac Studies   2D echo 12/16/18 1. Left ventricular ejection fraction, by visual estimation, is 50 to 55%. The left ventricle has normal function. Left ventricular septal wall thickness was mildly increased. There is no left ventricular hypertrophy. 2. Left ventricular diastolic parameters are consistent with Grade II diastolic dysfunction (pseudonormalization). 3. 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 4. Global right ventricle has normal systolic function.The right ventricular size is normal. No increase in right ventricular wall thickness. 5. Left atrial size was normal. 6. Right atrial size was normal. 7. Moderate calcification of the mitral valve leaflet(s). 8. Moderate mitral annular calcification. 9. Moderate thickening of the mitral valve leaflet(s). 10. The mitral valve is normal in structure. Trace mitral valve regurgitation. No evidence of mitral stenosis. 11. The tricuspid valve is normal in structure. Tricuspid valve regurgitation moderate. 12. The aortic valve is tricuspid. Aortic valve regurgitation is not visualized. No evidence of aortic valve sclerosis or stenosis. 13. There is Moderate sclerosis of the aortic valve. 14. There is Moderate thickening of the aortic valve. 15. The  pulmonic valve was grossly normal. Pulmonic valve regurgitation is mild. 16. Severely elevated pulmonary artery systolic pressure. 17. The inferior vena cava is normal in size with greater than 50% respiratory variability, suggesting right atrial pressure of 3 mmHg.  Nuc stress  T wave inversion was noted during stress.  Nuclear stress EF: 58%. Mild anteroapical hypokinesis.  Defect 1: There is a medium defect of moderate severity present in the mid anterior, mid anteroseptal and apical anterior location.  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  This is an intermediate risk study based upon infarct pattern.  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 Ezenduka.Troponin elevated to 770. 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 EKG also shows evolving STT changes with QT prolongation.  Nuclear stress test showed evidence of infarction.  The patient has malignant cells on pleural effusion with currently unknown primary.  Assessment & Plan    1.  Elevated troponin with evidence of infarction on nuclear stress and wall motion abnormality on echo-I had a long conversation with the patient and her daughter regarding management of likely coronary artery disease.  She tells me she would prefer medical management and she and her daughter both feel she would not like a coronary angiogram now or as an outpatient.  Fortunately she is already on an appropriate cardiovascular medication regimen which includes aspirin 81 mg daily, losartan  100 mg daily, metoprolol succinate 25 mg daily, rosuvastatin 20 mg daily.  She is also on amlodipine 10 mg daily for hypertension.  We have made an appointment in cardiovascular clinic on 01/10/2019 to review medications and symptoms.  The patient, her daughter, and I participated in shared decision making and determined that this would be appropriate and more in line with the patient's wishes.  She tells me she is not interested in coronary angiography, and given her work-up for malignancy, I agree that this is reasonable at this time.  This can always be revisited as an outpatient and she is aware of that.  Cardiology will sign off at this time, however do not hesitate to contact us with any questions or concerns regarding Ms. Martucci's care.  For questions or updates, please contact [place] Please consult [email or web address] for contact info under        Signed, Elouise Munroe, MD  12/22/2018, 3:20 PM

## 2018-12-22 NOTE — Progress Notes (Signed)
Patient ID: MODESTINE SCHERZINGER, female   DOB: [birth year], 78 y.o.   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  Assessment & Plan:   Acute hypoxic respiratory failure -From pleural effusion and COPD exacerbation -Currently still requiring 3 L oxygen via nasal cannula.  Wean off as able.  Might need home oxygen on discharge.  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   -Status post Pleurx catheter placement by PCCM on 12/20/2018. -Oncology evaluation appreciated.  Follow CT chest abdomen and pelvis ordered by oncology.  Outpatient follow-up with oncology.  COPD exacerbation -Continue DuoNeb, Breo Ellipta.  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  Cardiology following.  Spoke to Dr. Acharya/cardiology on phone on 12/21/2018 who recommended medical management at this time and follow further oncology recommendations.  Cardiology might consider outpatient cardiac catheterization as well.  Diabetes mellitus type 2 uncontrolled with hyperglycemia -Continue CBGs with SSI.  Add NovoLog 3 units 3 times daily with meals.  Metformin on hold.  Essential hypertension -Continue losartan, amlodipine and metoprolol  Anxiety -Continue Cymbalta and Xanax  Rheumatoid arthritis -Continue leflunomide   DVT prophylaxis: Lovenox Code Status: Full Family Communication: None at bedside Disposition Plan: Home in 1 to 2 days once  cleared by cardiology/pulmonary/oncology  Consultants: Cardiology/pulmonary/oncology  Procedures:  12/16/2018: Thoracentesis  2D echo 12/16/18 1. Left ventricular ejection fraction, by visual estimation, is 50 to 55%. The left ventricle has normal function. Left ventricular septal wall thickness was mildly increased. There is no left ventricular hypertrophy. 2. Left ventricular diastolic parameters are consistent with Grade II diastolic dysfunction (pseudonormalization). 3. 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 4. Global right ventricle has normal systolic function.The right ventricular size is normal. No increase in right ventricular wall thickness. 5. Left atrial size was normal. 6. Right atrial size was normal. 7. Moderate calcification of the mitral valve leaflet(s). 8. Moderate mitral annular calcification. 9. Moderate thickening of the mitral valve leaflet(s). 10. The mitral valve is normal in structure. Trace mitral valve regurgitation. No evidence of mitral stenosis. 11. The tricuspid valve is normal in structure. Tricuspid valve regurgitation moderate. 12. The aortic valve is tricuspid. Aortic valve regurgitation is not visualized. No evidence of aortic valve sclerosis or stenosis. 13. There is Moderate sclerosis of the aortic valve. 14. There is Moderate thickening of the aortic valve. 15. The pulmonic valve was grossly normal. Pulmonic valve regurgitation is mild. 16. Severely elevated pulmonary artery systolic pressure. 17. The inferior vena cava is normal in size with greater than 50% respiratory variability, suggesting right atrial pressure of 3 mmHg.  Right Pleurx catheter placement on 12/20/2018 by PCCM  12/19/2018: Myoview stress test  T wave inversion was noted during stress.  Nuclear stress EF: 58%. Mild anteroapical hypokinesis.  Defect 1: There is a medium defect of  moderate severity present in the mid anterior, mid anteroseptal and apical anterior location.  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  This is an intermediate risk  study based upon infarct pattern.     Antimicrobials:  None   Subjective: Patient seen and examined at bedside.  Patient states that she still feels weak and does not feel ready to go home today.  Complains of pain at the Pleurx catheter site.  No overnight fever, vomiting, worsening shortness of breath.  Objective: Vitals:   12/21/18 1407 12/21/18 2013 12/21/18 2215 12/22/18 0722  BP: 119/68  121/77 136/89  Pulse: 80 81 86 86  Resp: 15 16 (!) 21 20  Temp: 98.4 F (36.9 C)  98.3 F (36.8 C) 98.5 F (36.9 C)  TempSrc: Oral  Oral Oral  SpO2: 100% 92%  100%  Weight:      Height:        Intake/Output Summary (Last 24 hours) at 12/22/2018 0947 Last data filed at 12/22/2018 0353 Gross per 24 hour  Intake 480 ml  Output 250 ml  Net 230 ml   Filed Weights   12/16/18 0637 12/18/18 0406  Weight: 68.5 kg 69.6 kg    Examination:  General exam: No acute distress.  Looks chronically ill. Respiratory system: Bilateral decreased breath sounds at bases scattered crackles.  No wheezing.  Right-sided Pleurx cath present.   Cardiovascular system: Rate controlled, S1-S2 heard Gastrointestinal system: Abdomen is nondistended, soft and nontender. Normal bowel sounds heard. Extremities: No cyanosis; trace edema   Data Reviewed: I have personally reviewed following labs and imaging studies  CBC: Recent Labs  Lab 12/18/18 0313 12/19/18 0304 12/20/18 0056 12/21/18 0243 12/22/18 0313  WBC 18.1* 15.8* 11.8* 14.0* 21.1*  NEUTROABS 15.7* 12.9* 9.6* 10.1* 16.5*  HGB 11.8* 11.2* 11.9* 12.2 13.0  HCT 36.7 33.8* 36.9 37.9 39.4  MCV 87.0 84.3 85.8 86.7 84.5  PLT 403* 341 404* 393 353   Basic Metabolic Panel: Recent Labs  Lab 12/16/2018 0225 12/18/18 0313 12/19/18 0304 12/20/18 0056 12/21/18  0243 12/22/18 0313  NA 141 137 139 140 141 131*  K 4.7 4.4 4.0 4.2 3.7 4.8  CL 105 103 105 106 105 96*  CO2 22 23 24 22 25  21*  GLUCOSE 165* 211* 299* 197* 146* 217*  BUN 24* 35* 28* 19 18 28*  CREATININE 1.05* 1.16* 1.02* 0.70 0.78 1.48*  CALCIUM 8.6* 8.2* 8.2* 8.2* 8.4* 7.6*  MG 1.5* 2.1 2.0 1.8  --  1.7   GFR: Estimated Creatinine Clearance: 30.3 mL/min (A) (by C-G formula based on SCr of 1.48 mg/dL (H)). Liver Function Tests: Recent Labs  Lab 12/15/2018 0225  AST 22  ALT 10  ALKPHOS 63  BILITOT 0.7  PROT 6.5  ALBUMIN 2.8*   No results for input(s): LIPASE, AMYLASE in the last 168 hours. No results for input(s): AMMONIA in the last 168 hours. Coagulation Profile: No results for input(s): INR, PROTIME in the last 168 hours. Cardiac Enzymes: No results for input(s): CKTOTAL, CKMB, CKMBINDEX, TROPONINI in the last 168 hours. BNP (last 3 results) No results for input(s): PROBNP in the last 8760 hours. HbA1C: No results for input(s): HGBA1C in the last 72 hours. CBG: Recent Labs  Lab 12/21/18 0754 12/21/18 1156 12/21/18 1611 12/21/18 2217 12/22/18 0754  GLUCAP 226* 211* 173* 224* 222*   Lipid Profile: No results for input(s): CHOL, HDL, LDLCALC, TRIG, CHOLHDL, LDLDIRECT in the last 72 hours. Thyroid Function Tests: No results for input(s): TSH, T4TOTAL, FREET4, T3FREE, THYROIDAB in the last 72 hours. Anemia Panel: No results for input(s): VITAMINB12, FOLATE, FERRITIN, TIBC, IRON, RETICCTPCT in the last 72 hours. Sepsis Labs: No  results for input(s): PROCALCITON, LATICACIDVEN in the last 168 hours.  Recent Results (from the past 240 hour(s))  SARS CORONAVIRUS 2 (TAT 6-24 HRS) Nasopharyngeal Nasopharyngeal Swab     Status: None   Collection Time: 12/16/18  4:20 AM   Specimen: Nasopharyngeal Swab  Result Value Ref Range Status   SARS Coronavirus 2 NEGATIVE NEGATIVE Final    Comment: (NOTE) SARS-CoV-2 target nucleic acids are NOT DETECTED. The SARS-CoV-2 RNA is  generally detectable in upper and lower respiratory specimens during the acute phase of infection. Negative results do not preclude SARS-CoV-2 infection, do not rule out co-infections with other pathogens, and should not be used as the sole basis for treatment or other patient management decisions. Negative results must be combined with clinical observations, patient history, and epidemiological information. The expected result is Negative. Fact Sheet for Patients: SugarRoll.be Fact Sheet for Healthcare Providers: https://www.woods-mathews.com/ This test is not yet approved or cleared by the [place] FDA and  has been authorized for detection and/or diagnosis of SARS-CoV-2 by FDA under an Emergency Use Authorization (EUA). This EUA will remain  in effect (meaning this test can be used) for the duration of the COVID-19 declaration under Section 56 4(b)(1) of the Act, 21 U.S.C. section 360bbb-3(b)(1), unless the authorization is terminated or revoked sooner. Performed at [facility], [place] [address] [place], [place] [address]   Gram stain     Status: None   Collection Time: 12/16/18  2:54 PM   Specimen: Lung, Right; Pleural Fluid  Result Value Ref Range Status   Specimen Description FLUID RIGHT PLEURAL  Final   Special Requests NONE  Final   Gram Stain   Final    MODERATE WBC PRESENT, PREDOMINANTLY PMN NO ORGANISMS SEEN Performed at [facility], [place] [address] [place], [place] [address]    Report Status 12/15/2018 FINAL  Final  Culture, body fluid-bottle     Status: None   Collection Time: 12/16/18  2:54 PM   Specimen: Fluid  Result Value Ref Range Status   Specimen Description FLUID RIGHT PLEURAL  Final   Special Requests BOTTLES DRAWN AEROBIC AND ANAEROBIC 10CC  Final   Culture   Final    NO GROWTH 5 DAYS Performed at [facility], [place] [address], [place], [place] [address]    Report Status 12/21/2018 FINAL   Final  Culture, sputum-assessment     Status: None   Collection Time: 12/18/18  4:47 AM   Specimen: Sputum  Result Value Ref Range Status   Specimen Description SPUTUM  Final   Special Requests NONE  Final   Sputum evaluation   Final    Sputum specimen not acceptable for testing.  Please recollect.   Results Called to: Frankey Poot, RN AT [phone] ON 12/18/18 BY C. JESSUP, MT. Performed at [facility], [place] [address], [place], [place] [address]    Report Status 12/18/2018 FINAL  Final         Radiology Studies: Dg Chest Port 1 View  Result Date: 12/20/2018 CLINICAL DATA:  Right pleural effusion. Status post insertion of right chest tube. EXAM: PORTABLE CHEST 1 VIEW COMPARISON:  12/19/2018 FINDINGS: Right-sided chest tube is been inserted. The right pleural effusion has diminished and is now moderate. The right chest tube crosses the midline, probably in the anterior clear space. No pneumothorax. Left lung is clear. Heart size and vascularity are normal. Aortic atherosclerosis. No acute bone abnormality. Chronic degenerative changes of the right  shoulder. IMPRESSION: 1. Marked decrease in the right pleural effusion after insertion of the right chest tube. No pneumothorax. I suspect the chest tube is anterior in position. 2. No other change. 3.  Aortic Atherosclerosis (ICD10-I70.0). Electronically Signed   By: Lorriane Shire M.D.   On: 12/20/2018 17:40        Scheduled Meds: . amLODipine  10 mg Oral Daily  . aspirin EC  81 mg Oral q morning - 10a  . DULoxetine  60 mg Oral BID  . enoxaparin (LOVENOX) injection  40 mg Subcutaneous Q24H  . fluticasone furoate-vilanterol  1 puff Inhalation Daily  . insulin aspart  0-15 Units Subcutaneous TID WC  . insulin aspart  0-5 Units Subcutaneous QHS  . ipratropium-albuterol  3 mL Nebulization TID  . leflunomide  20 mg Oral q morning - 10a  . losartan  100 mg Oral q morning - 10a  . mouth rinse  15 mL Mouth Rinse BID  . metoprolol  succinate  25 mg Oral Daily  . pantoprazole  40 mg Oral q morning - 10a  . polyethylene glycol  17 g Oral BID  . rosuvastatin  20 mg Oral q1800  . sodium chloride flush  3 mL Intravenous Q12H  . tetracaine  10 mg Infiltration Once  . tetracaine  90 mg Infiltration Once   Continuous Infusions: . sodium chloride            Aline August, MD [facility] 12/22/2018, 9:47 AM

## 2018-12-22 NOTE — Progress Notes (Signed)
PleurX catheter drained per MD order.  1000 ml removed.  Patient tolerated well.

## 2018-12-23 ENCOUNTER — Inpatient Hospital Stay: Payer: Medicare Other | Admitting: Critical Care Medicine

## 2018-12-23 ENCOUNTER — Telehealth: Payer: Self-pay | Admitting: Internal Medicine

## 2018-12-23 LAB — CBC WITH DIFFERENTIAL/PLATELET
Abs Immature Granulocytes: 0.19 10*3/uL — ABNORMAL HIGH (ref 0.00–0.07)
Basophils Absolute: 0 10*3/uL (ref 0.0–0.1)
Basophils Relative: 0 %
Eosinophils Absolute: 0 10*3/uL (ref 0.0–0.5)
Eosinophils Relative: 0 %
HCT: 38.2 % (ref 36.0–46.0)
Hemoglobin: 12.3 g/dL (ref 12.0–15.0)
Immature Granulocytes: 1 %
Lymphocytes Relative: 10 %
Lymphs Abs: 1.9 10*3/uL (ref 0.7–4.0)
MCH: 27.6 pg (ref 26.0–34.0)
MCHC: 32.2 g/dL (ref 30.0–36.0)
MCV: 85.8 fL (ref 80.0–100.0)
Monocytes Absolute: 1.5 10*3/uL — ABNORMAL HIGH (ref 0.1–1.0)
Monocytes Relative: 7 %
Neutro Abs: 16.5 10*3/uL — ABNORMAL HIGH (ref 1.7–7.7)
Neutrophils Relative %: 82 %
Platelets: 398 10*3/uL (ref 150–400)
RBC: 4.45 MIL/uL (ref 3.87–5.11)
RDW: 14.5 % (ref 11.5–15.5)
WBC: 20.2 10*3/uL — ABNORMAL HIGH (ref 4.0–10.5)
nRBC: 0 % (ref 0.0–0.2)

## 2018-12-23 LAB — GLUCOSE, CAPILLARY: Glucose-Capillary: 176 mg/dL — ABNORMAL HIGH (ref 70–99)

## 2018-12-23 MED ORDER — OXYCODONE HCL 10 MG PO TABS: 10.0000 mg | ORAL_TABLET | Freq: Four times a day (QID) | ORAL | 0 refills | Status: AC | PRN

## 2018-12-23 MED ORDER — ROSUVASTATIN CALCIUM 20 MG PO TABS: 20.0000 mg | ORAL_TABLET | Freq: Every day | ORAL | 0 refills | Status: AC

## 2018-12-23 MED ORDER — ONDANSETRON HCL 4 MG PO TABS: 4.0000 mg | ORAL_TABLET | Freq: Four times a day (QID) | ORAL | 0 refills | Status: AC | PRN

## 2018-12-23 MED ORDER — NITROGLYCERIN 0.4 MG SL SUBL: 0.4000 mg | SUBLINGUAL_TABLET | SUBLINGUAL | 0 refills | Status: AC | PRN

## 2018-12-23 MED ORDER — AMLODIPINE BESYLATE 10 MG PO TABS: 10.0000 mg | ORAL_TABLET | Freq: Every day | ORAL | Status: AC

## 2018-12-23 NOTE — Telephone Encounter (Signed)
I received a staff msg to schedule Ms. Widmer to see Dr. Julien Nordmann on 12/7 at 1:30pm. I cld and lft the appt date and time on the pt's daughter [facility]' voicemail.

## 2018-12-23 NOTE — Progress Notes (Signed)
Brief oncology note:  I called [facility] and have requested for foundation 1 and PD-L1 testing to be sent on pathology.  I have arranged for outpatient follow-up with Dr. Earlie Server on Monday, 12/26/2018.  Mikey Bussing, DNP, AGPCNP-BC, AOCNP

## 2018-12-23 NOTE — Discharge Summary (Signed)
Physician Discharge Summary  Leslie Gamble HGD:[id number] DOB: 1940/03/31 DOA: 12/15/2018  PCP: Wenda Low, MD  Admit date: 12/15/2018 Discharge date: 12/23/2018  Admitted From: Home Disposition: Home  Recommendations for Outpatient Follow-up:  1. Follow up with PCP in 1 week with repeat CBC/BMP 2. Outpatient follow-up with oncology/cardiology/pulmonary 3. 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  During the hospitalization, she underwent right-sided Pleurx catheter placement by PCCM.  Still requiring supplemental oxygen and will need supplemental oxygen on discharge.  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  Oncology has evaluated the patient and recommends outpatient follow-up.  She also had intermediate risk stress test for which cardiology evaluated the patient and recommended medical management for now and outpatient follow-up.  She will be discharged home today with home health PT/RN  Discharge Diagnoses:  Acute hypoxic respiratory failure -From pleural effusion and COPD exacerbation -Currently still requiring 2-4 L oxygen via nasal cannula.  Patient becomes hypoxic with saturations dropping down to the 80s on room air.  Will discharge on supplemental oxygen via nasal cannula 2 to 4 L/min.  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   -Status post Pleurx catheter placement by PCCM on  12/20/2018. -Oncology evaluation appreciated.  Oncology get CT of the chest abdomen and pelvis.  Outpatient follow-up with oncology being set up for next week.  COPD exacerbation -Patient was treated with steroid burst, which has been subsequently discontinued -No wheezing on exam -Outpatient follow-up.  Continue home regimen.  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  Cardiology following.  Spoke to Dr. Acharya/cardiology on phone on 12/21/2018 who recommended medical management at this time and follow further oncology recommendations.   -Outpatient follow-up with cardiology  Diabetes mellitus type 2 uncontrolled with hyperglycemia -Carb modified diet.  Hold Metformin for now due to slightly elevated creatinine.  Outpatient follow-up with PCP.  Essential hypertension -Continue losartan, amlodipine and metoprolol  Anxiety -Continue Cymbalta and Xanax  Rheumatoid arthritis -Continue leflunomide  Discharge Instructions  Discharge Instructions    Ambulatory referral to Cardiology   Complete by: As directed    Ambulatory referral to Oncology   Complete by: As directed    Ambulatory referral to Pulmonology   Complete by: As directed    Diet - low sodium heart healthy   Complete by: As directed    Diet Carb Modified   Complete by: As directed    Increase activity slowly   Complete by: As directed    Pleural Drainage Schedule   Complete by: As directed    Pleural drainage schedule to start 12/22/2018. Drain daily, up to max of 1L 3 times a week  If <176ml for 3 consecutive drains then call the practice that inserted the catheter for evaluation and possible removal. [facility] [phone]     Allergies as of 12/23/2018      Reactions   Latex Other (See Comments)   Patient states she had a reaction to it  Avelox [moxifloxacin Hcl In Nacl] Itching, Swelling   Facial swelling   Cephalexin Swelling    Nose swelling   Ciprofloxacin Swelling   Facial swelling   Clarithromycin Swelling   Facial swelling   Clindamycin Hcl Swelling   Facial swelling   Codeine Swelling   Lip swelling   Diflucan [fluconazole] Diarrhea   Lidocaine Swelling   Facial swelling   Metronidazole Swelling   Facial swelling   Nitrofurantoin Monohyd Macro Swelling   Facial swelling   Other Itching   Reaction toMagic mouthwash    Penicillins Swelling   Facial swelling Did it involve swelling of the face/tongue/throat, SOB, or low BP? Yes Did it involve sudden or severe rash/hives, skin peeling, or any reaction on the inside of your mouth or nose? No Did you need to seek medical attention at a hospital or doctor's office? No When did it last happen?young adult If all above answers are "NO", may proceed with cephalosporin use.   Shrimp [shellfish Allergy] Swelling   In nose and lips   Sitagliptin Swelling   Facial swelling   Sulfa Antibiotics Swelling   Facial swelling   Tetracyclines & Related Swelling   Facial swelling   Tramadol Hcl Swelling    facial swelling, dyspnea      Medication List    STOP taking these medications   metFORMIN 1000 MG tablet Commonly known as: GLUCOPHAGE     TAKE these medications   acetaminophen 500 MG tablet Commonly known as: TYLENOL Take 1,000 mg by mouth every 6 (six) hours as needed for headache (pain).   albuterol 108 (90 Base) MCG/ACT inhaler Commonly known as: VENTOLIN HFA Inhale 1-2 puffs into the lungs every 6 (six) hours as needed for wheezing or shortness of breath.   ALPRAZolam 0.25 MG tablet Commonly known as: XANAX Take 0.25 mg by mouth 2 (two) times daily as needed for anxiety.   amLODipine 10 MG tablet Commonly known as: NORVASC Take 1 tablet (10 mg total) by mouth daily. What changed: when to take this   aspirin 81 MG EC tablet Take 81 mg by mouth every morning. Notes to patient: Tomorrow 2019/01/17 @ 10am   Breo Ellipta 100-25  MCG/INH Aepb Generic drug: fluticasone furoate-vilanterol Inhale 1 puff into the lungs every morning. Notes to patient: Tomorrow 17-Jan-2019 @ 10am   DULoxetine 60 MG capsule Commonly known as: CYMBALTA Take 1 capsule (60 mg total) by mouth 2 (two) times daily.   ferrous sulfate 325 (65 FE) MG EC tablet Take 325 mg by mouth every morning. Notes to patient: Tomorrow 2019/01/17 @ 10am   fluticasone 50 MCG/ACT nasal spray Commonly known as: FLONASE Place 2 sprays into both nostrils daily as needed for allergies.   insulin aspart 100 UNIT/ML injection Commonly known as: novoLOG Inject 0-9 Units into the skin 3 (three) times daily with meals. CBG < 70: implement hypoglycemia protocol CBG 70 - 120: 0 units CBG 121 - 150: 1 unit CBG 151 - 200: 2 units CBG 201 - 250: 3 units CBG 251 - 300: 5 units CBG 301 - 350: 7 units CBG 351 - 400: 9 units CBG > 400: call MD. Notes to patient: Today 12/23/2018 @ 12pm   leflunomide 20 MG tablet Commonly known as: ARAVA Take 20 mg by mouth every morning. Notes to patient: Tomorrow 2019-01-17 @ 10am   losartan 100 MG tablet Commonly known as: Cozaar Take 1 tablet (100 mg total) by mouth daily. What changed: when to take this Notes to  patient: Tomorrow 2018-12-25 @ 10am   metoprolol succinate 25 MG 24 hr tablet Commonly known as: TOPROL-XL Take 25 mg by mouth daily. Notes to patient: Tomorrow 25-Dec-2018 @ 10am   mirtazapine 30 MG tablet Commonly known as: REMERON Take 1 tablet (30 mg total) by mouth at bedtime. Notes to patient: Today 12/23/2018 @ 10pm   multivitamin with minerals Tabs tablet Take 1 tablet by mouth every morning. [facility] Notes to patient: Tomorrow 2018/12/25 @ 10am   nitroGLYCERIN 0.4 MG SL tablet Commonly known as: NITROSTAT Place 1 tablet (0.4 mg total) under the tongue every 5 (five) minutes as needed for chest pain.   ondansetron 4 MG tablet Commonly known as: ZOFRAN Take 1 tablet (4 mg total) by mouth every 6  (six) hours as needed for nausea.   Oxycodone HCl 10 MG Tabs Take 1 tablet (10 mg total) by mouth every 6 (six) hours as needed (pain). For knee pain What changed:   how much to take  reasons to take this   pantoprazole 40 MG tablet Commonly known as: PROTONIX Take 40 mg by mouth every morning. Notes to patient: Tomorrow Dec 25, 2018 @ 10am   polyethylene glycol 17 g packet Commonly known as: MIRALAX / GLYCOLAX Take 17 g by mouth 2 (two) times daily.   pravastatin 10 MG tablet Commonly known as: PRAVACHOL Take 10 mg by mouth every morning. Notes to patient: Tomorrow 2018-12-25 @ 10am   rosuvastatin 20 MG tablet Commonly known as: CRESTOR Take 1 tablet (20 mg total) by mouth daily at 6 PM. Notes to patient: Today 12/23/2018 @ 6pm   Systane 0.4-0.3 % Gel ophthalmic gel Generic drug: Polyethyl Glycol-Propyl Glycol Place 1 application into both eyes daily as needed (dry eyes).            Durable Medical Equipment  (From admission, onward)         Start     Ordered   12/22/18 1627  For home use only DME oxygen  Once    Question Answer Comment  Length of Need 6 Months   Mode or (Route) Nasal cannula   Liters per Minute 4   Frequency Continuous (stationary and portable oxygen unit needed)   Oxygen conserving device Yes   Oxygen delivery system Gas      12/22/18 1627         Follow-up Information    [facility]. Go on 12/27/2018.   Specialty: Pulmonology Why: Please follow up with Dr.Icard on 12/27/2018 at 2 pm for wound check, suture removal. Contact information: [address] [place] [address] [phone]       [facility], [place] Follow up on 01/10/2019.   Specialties: Cardiology, Radiology Why: Please go to hospital follow up December 22 at 9:30 AM Contact information: [place] [address] [phone]        [facility] Follow up.   Why: home oxygen arranged       Care,  [facility] Follow up.   Specialty: Home Health Services Why: home health services arranged Contact information: [place] [address] [phone]        Wenda Low, MD. Schedule an appointment as soon as possible for a visit in 1 week(s).   Specialty: Internal Medicine Why: with repeat cbc/bmp Contact information: [address], [address] [place] [address] [phone]        Lelon Perla, MD .  Specialty: Cardiology Contact information: [address] [place] [address] [phone]          Allergies  Allergen Reactions  . Latex Other (See Comments)    Patient states she had a reaction to it  . Avelox [Moxifloxacin Hcl In Nacl] Itching and Swelling    Facial swelling  . Cephalexin Swelling    Nose swelling  . Ciprofloxacin Swelling    Facial swelling  . Clarithromycin Swelling    Facial swelling  . Clindamycin Hcl Swelling    Facial swelling  . Codeine Swelling    Lip swelling  . Diflucan [Fluconazole] Diarrhea  . Lidocaine Swelling    Facial swelling  . Metronidazole Swelling    Facial swelling  . Nitrofurantoin Monohyd Macro Swelling    Facial swelling  . Other Itching    Reaction toMagic mouthwash   . Penicillins Swelling    Facial swelling Did it involve swelling of the face/tongue/throat, SOB, or low BP? Yes Did it involve sudden or severe rash/hives, skin peeling, or any reaction on the inside of your mouth or nose? No Did you need to seek medical attention at a hospital or doctor's office? No When did it last happen?young adult If all above answers are "NO", may proceed with cephalosporin use.  . Shrimp [Shellfish Allergy] Swelling    In nose and lips  . Sitagliptin Swelling    Facial swelling  . Sulfa Antibiotics Swelling    Facial swelling  . Tetracyclines & Related Swelling    Facial swelling  . Tramadol Hcl Swelling     facial swelling, dyspnea     Consultations:  PCCM/cardiology/oncology   Procedures/Studies: Dg Chest 1 View  Result Date: 12/16/2018 CLINICAL DATA:  Post RIGHT thoracentesis EXAM: CHEST  1 VIEW COMPARISON:  Earlier study of 12/16/2018 FINDINGS: Enlargement of cardiac silhouette with slight vascular congestion. Atherosclerotic calcification aorta. Persistent RIGHT pleural effusion and basilar atelectasis, slightly decreased. No pneumothorax following RIGHT thoracentesis. LEFT lung remains clear. Bones demineralized. IMPRESSION: Slightly decreased RIGHT pleural effusion and basilar atelectasis post thoracentesis. No pneumothorax. Enlargement of cardiac silhouette. Electronically Signed   By: Lavonia Dana M.D.   On: 12/16/2018 14:54   Dg Chest 1 View  Result Date: 11/29/2018 CLINICAL DATA:  Status post right thoracentesis. EXAM: CHEST  1 VIEW COMPARISON:  November 28, 2018. FINDINGS: No pneumothorax is noted. Stable cardiomediastinal silhouette. Right pleural effusion is significantly smaller status post thoracentesis. Left lung is clear. IMPRESSION: Right pleural effusion is significantly smaller status post thoracentesis. No pneumothorax is noted. Electronically Signed   By: Marijo Conception M.D.   On: 11/29/2018 14:36   Dg Chest 2 View  Result Date: 12/16/2018 CLINICAL DATA:  Shortness of breath with wheezing and productive cough EXAM: CHEST - 2 VIEW COMPARISON:  Radiograph 12/01/2018, CT 11/10/2018 FINDINGS: Moderate to large right pleural effusion tracking into the fissures with adjacent opacity likely reflecting atelectasis. Left lung is essentially clear. The aorta is calcified. The remaining cardiomediastinal contours are unremarkable. No acute osseous or soft tissue abnormality. Degenerative changes are present in the imaged spine and shoulders. IMPRESSION: 1. Moderate to large right pleural effusion tracking into the fissures with adjacent opacity likely atelectasis though underlying infection is not excluded.  Electronically Signed   By: Lovena Le M.D.   On: 12/16/2018 00:26   Dg Chest 2 View  Result Date: 11/30/2018 CLINICAL DATA:  RIGHT pleural effusion, dyspnea, thoracentesis yesterday EXAM: CHEST - 2 VIEW COMPARISON:  10/29/2018 FINDINGS: Normal  heart size, mediastinal contours, and pulmonary vascularity. Atherosclerotic calcification aorta. Decreased RIGHT pleural effusion since previous exam. However, increased opacity is seen within the RIGHT lower lobe either representing increased atelectasis or developing consolidation. Remaining lungs clear. No pneumothorax. Bones unremarkable. IMPRESSION: Decreased RIGHT pleural effusion. Increased RIGHT lower lobe opacity question increased atelectasis versus developing pneumonia. Electronically Signed   By: Lavonia Dana M.D.   On: 11/30/2018 11:24   Dg Chest 2 View  Result Date: 11/28/2018 CLINICAL DATA:  Pneumonia. Severe shortness of breath. EXAM: CHEST - 2 VIEW COMPARISON:  Chest x-rays dated 11/09/2018 and 11/10/2018 FINDINGS: The large right pleural effusion has reaccumulated since 11/10/2018 there is secondary compressive atelectasis of the right lung. Heart size and vascularity are normal. Left lung is clear. IMPRESSION: Large right pleural effusion has reaccumulated since the prior study. No other significant abnormalities. Electronically Signed   By: Lorriane Shire M.D.   On: 11/28/2018 15:17   Ct Chest W Contrast  Result Date: 12/22/2018 CLINICAL DATA:  Malignant right pleural effusion. Evaluate for primary neoplasm. EXAM: CT CHEST, ABDOMEN, AND PELVIS WITH CONTRAST TECHNIQUE: Multidetector CT imaging of the chest, abdomen and pelvis was performed following the standard protocol during bolus administration of intravenous contrast. CONTRAST:  130mL OMNIPAQUE IOHEXOL 300 MG/ML  SOLN COMPARISON:  Chest CT 11/10/2018 FINDINGS: CT CHEST FINDINGS Cardiovascular: The heart is normal in size and stable. No pericardial effusion. There is mild tortuosity and  moderate calcification of the thoracic aorta but no aneurysm or dissection. The branch vessels are patent. Stable fairly extensive three-vessel coronary artery calcifications. Mediastinum/Nodes: Slight interval enlargement of a precarinal lymph node which measures 8 mm on image number 23. There is a 7 mm left-sided subcarinal node adjacent to the esophagus. The esophagus is grossly normal. Lungs/Pleura: Persistent large left pleural effusion with complete left lower lobe atelectasis. No obvious lung masses identified. The compressed lung tissue is slightly irregular. There are several enhancing right-sided pleural nodules suggesting pleural metastatic disease. A PleurX drainage catheter is in place. Stable underlying emphysematous changes and areas of pulmonary scarring. No worrisome pulmonary lesion is identified. No metastatic pulmonary nodules. Musculoskeletal: No significant bony findings. No supraclavicular or axillary adenopathy. CT ABDOMEN PELVIS FINDINGS Hepatobiliary: No focal hepatic lesions or intrahepatic biliary dilatation. The gallbladder appears normal. No common bile duct dilatation. The portal and hepatic veins are patent. Pancreas: Moderate fatty atrophy of the pancreas but no mass, inflammation or ductal dilatation. Spleen: Normal size.  No focal lesions. Adrenals/Urinary Tract: The adrenal glands are unremarkable. No adrenal gland lesions are demonstrated. Patchy areas of decreased perfusion involving the left kidney greater than right. Findings could suggest pyelonephritis. Recommend correlation with clinical findings and urinalysis. No worrisome renal lesions or hydronephrosis. Simple appearing right renal cyst. The bladder is unremarkable. Stomach/Bowel: The stomach, duodenum, small bowel and colon are unremarkable. No acute inflammatory process, mass lesions or obstructive findings. The terminal ileum and appendix are normal. There are surgical changes involving the lower sigmoid colon and  upper sigmoid colon along with other scattered surgical clips in the left abdomen and pelvis. Moderate stool in the colon could suggest constipation. Vascular/Lymphatic: Moderate to advanced atherosclerotic calcifications involving the aorta and branch vessel ostia and iliac arteries but no aneurysm or dissection. The major venous structures are patent. No mesenteric or retroperitoneal mass or adenopathy. Reproductive: The uterus and ovaries are unremarkable. Other: No pelvic mass or adenopathy. No free pelvic fluid collections. No inguinal mass or adenopathy. No abdominal wall hernia or subcutaneous lesions.  There is diffuse inflammatory change/edema or inflammation involving the right chest wall and right flank area which may reflect cellulitis. It is possible this is related to the PleurX drainage catheter. Musculoskeletal: No significant bony findings. No worrisome bone lesions. IMPRESSION: 1. Persistent large right pleural effusion and despite right-sided PleurX drainage catheter. 2. Numerous enhancing small pleural nodules suggesting pleural metastatic disease. 3. Complete compressive atelectasis of the right lower lobe without obvious lung mass. 4. Emphysematous changes and pulmonary scarring but no worrisome pulmonary lesions or pulmonary nodules. 5. No findings to suggest metastatic disease involving the abdomen/pelvis. 6. Evidence of prior colon surgery but no colon mass. 7. Inflammatory/interstitial changes involving the entire right chest wall and right abdominal flank area suggesting cellulitis and possibly related to the PleurX drainage catheter. 8. Findings suspicious for bilateral pyelonephritis, left greater than right. Recommend correlation with clinical findings and urinalysis. Electronically Signed   By: Marijo Sanes M.D.   On: 12/22/2018 08:10   Ct Abdomen Pelvis W Contrast  Result Date: 12/22/2018 CLINICAL DATA:  Malignant right pleural effusion. Evaluate for primary neoplasm. EXAM: CT  CHEST, ABDOMEN, AND PELVIS WITH CONTRAST TECHNIQUE: Multidetector CT imaging of the chest, abdomen and pelvis was performed following the standard protocol during bolus administration of intravenous contrast. CONTRAST:  156mL OMNIPAQUE IOHEXOL 300 MG/ML  SOLN COMPARISON:  Chest CT 11/10/2018 FINDINGS: CT CHEST FINDINGS Cardiovascular: The heart is normal in size and stable. No pericardial effusion. There is mild tortuosity and moderate calcification of the thoracic aorta but no aneurysm or dissection. The branch vessels are patent. Stable fairly extensive three-vessel coronary artery calcifications. Mediastinum/Nodes: Slight interval enlargement of a precarinal lymph node which measures 8 mm on image number 23. There is a 7 mm left-sided subcarinal node adjacent to the esophagus. The esophagus is grossly normal. Lungs/Pleura: Persistent large left pleural effusion with complete left lower lobe atelectasis. No obvious lung masses identified. The compressed lung tissue is slightly irregular. There are several enhancing right-sided pleural nodules suggesting pleural metastatic disease. A PleurX drainage catheter is in place. Stable underlying emphysematous changes and areas of pulmonary scarring. No worrisome pulmonary lesion is identified. No metastatic pulmonary nodules. Musculoskeletal: No significant bony findings. No supraclavicular or axillary adenopathy. CT ABDOMEN PELVIS FINDINGS Hepatobiliary: No focal hepatic lesions or intrahepatic biliary dilatation. The gallbladder appears normal. No common bile duct dilatation. The portal and hepatic veins are patent. Pancreas: Moderate fatty atrophy of the pancreas but no mass, inflammation or ductal dilatation. Spleen: Normal size.  No focal lesions. Adrenals/Urinary Tract: The adrenal glands are unremarkable. No adrenal gland lesions are demonstrated. Patchy areas of decreased perfusion involving the left kidney greater than right. Findings could suggest  pyelonephritis. Recommend correlation with clinical findings and urinalysis. No worrisome renal lesions or hydronephrosis. Simple appearing right renal cyst. The bladder is unremarkable. Stomach/Bowel: The stomach, duodenum, small bowel and colon are unremarkable. No acute inflammatory process, mass lesions or obstructive findings. The terminal ileum and appendix are normal. There are surgical changes involving the lower sigmoid colon and upper sigmoid colon along with other scattered surgical clips in the left abdomen and pelvis. Moderate stool in the colon could suggest constipation. Vascular/Lymphatic: Moderate to advanced atherosclerotic calcifications involving the aorta and branch vessel ostia and iliac arteries but no aneurysm or dissection. The major venous structures are patent. No mesenteric or retroperitoneal mass or adenopathy. Reproductive: The uterus and ovaries are unremarkable. Other: No pelvic mass or adenopathy. No free pelvic fluid collections. No inguinal mass or adenopathy. No  abdominal wall hernia or subcutaneous lesions. There is diffuse inflammatory change/edema or inflammation involving the right chest wall and right flank area which may reflect cellulitis. It is possible this is related to the PleurX drainage catheter. Musculoskeletal: No significant bony findings. No worrisome bone lesions. IMPRESSION: 1. Persistent large right pleural effusion and despite right-sided PleurX drainage catheter. 2. Numerous enhancing small pleural nodules suggesting pleural metastatic disease. 3. Complete compressive atelectasis of the right lower lobe without obvious lung mass. 4. Emphysematous changes and pulmonary scarring but no worrisome pulmonary lesions or pulmonary nodules. 5. No findings to suggest metastatic disease involving the abdomen/pelvis. 6. Evidence of prior colon surgery but no colon mass. 7. Inflammatory/interstitial changes involving the entire right chest wall and right abdominal flank  area suggesting cellulitis and possibly related to the PleurX drainage catheter. 8. Findings suspicious for bilateral pyelonephritis, left greater than right. Recommend correlation with clinical findings and urinalysis. Electronically Signed   By: Marijo Sanes M.D.   On: 12/22/2018 08:10   Nm Myocar Multi W/spect W/wall Motion / Ef  Result Date: 12/19/2018  T wave inversion was noted during stress.  Nuclear stress EF: 58%. Mild anteroapical hypokinesis.  Defect 1: There is a medium defect of moderate severity present in the mid anterior, mid anteroseptal and apical anterior location.  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  This is an intermediate risk study based upon infarct pattern.  Candee Furbish, MD  Dg Chest Port 1 View  Result Date: 12/22/2018 CLINICAL DATA:  Pleural effusion, shortness of breath, history asthma, hypertension, type II diabetes mellitus EXAM: PORTABLE CHEST 1 VIEW COMPARISON:  Portable exam 0628 hours compared to 12/20/2018 FINDINGS: Normal heart size, mediastinal contours, and pulmonary vascularity. Layered RIGHT pleural effusion with asymmetric opacity of the entirety of the RIGHT hemithorax. Bibasilar atelectasis. No acute infiltrate or pneumothorax. Atherosclerotic calcification aorta. Question BILATERAL chronic rotator cuff tears. IMPRESSION: Layered RIGHT pleural effusion and bibasilar atelectasis. Aortic Atherosclerosis (ICD10-I70.0). Electronically Signed   By: Lavonia Dana M.D.   On: 12/22/2018 08:51   Dg Chest Port 1 View  Result Date: 12/20/2018 CLINICAL DATA:  Right pleural effusion. Status post insertion of right chest tube. EXAM: PORTABLE CHEST 1 VIEW COMPARISON:  12/19/2018 FINDINGS: Right-sided chest tube is been inserted. The right pleural effusion has diminished and is now moderate. The right chest tube crosses the midline, probably in the anterior clear space. No pneumothorax. Left lung is clear. Heart size and vascularity are  normal. Aortic atherosclerosis. No acute bone abnormality. Chronic degenerative changes of the right shoulder. IMPRESSION: 1. Marked decrease in the right pleural effusion after insertion of the right chest tube. No pneumothorax. I suspect the chest tube is anterior in position. 2. No other change. 3.  Aortic Atherosclerosis (ICD10-I70.0). Electronically Signed   By: Lorriane Shire M.D.   On: 12/20/2018 17:40   Dg Chest Port 1 View  Result Date: 12/19/2018 CLINICAL DATA:  Shortness of breath.  Weakness.  Pleural effusion. EXAM: PORTABLE CHEST 1 VIEW COMPARISON:  12/16/2018. FINDINGS: Stable cardiomegaly. Progressive right pleural effusion. Underlying infiltrate/atelectasis cannot be excluded. No pneumothorax. IMPRESSION: Progressive right pleural effusion. Underlying atelectasis/infiltrate cannot be excluded. Electronically Signed   By: Marcello Moores  Register   On: 12/19/2018 07:51   Dg Chest Port 1 View  Result Date: 12/01/2018 CLINICAL DATA:  78 year old female with right lower lobe infiltrate. EXAM: PORTABLE CHEST 1 VIEW COMPARISON:  Chest radiograph dated 11/30/2018. FINDINGS: Overall better aeration of the lungs compared to the radiograph of 11/30/2018  and decreased in the right lung base opacity. The left lung remains clear. No large pleural effusion or pneumothorax. The cardiac silhouette is within normal limits. Atherosclerotic calcification of the aorta. There is large amount of stool throughout the colon. No bowel dilatation or evidence of obstruction. Multiple surgical clips over the abdomen and suture line in the pelvis. Degenerative changes of the spine. No acute osseous pathology. IMPRESSION: 1. Overall better aeration of the lungs compared to the radiograph of 11/30/2018 with improved right lung base density. 2. Large amount of stool throughout the colon. No bowel obstruction. Electronically Signed   By: Anner Crete M.D.   On: 12/01/2018 11:39   Dg Abd Portable 1v  Result Date:  12/01/2018 CLINICAL DATA:  78 year old female with right lower lobe infiltrate. EXAM: PORTABLE CHEST 1 VIEW COMPARISON:  Chest radiograph dated 11/30/2018. FINDINGS: Overall better aeration of the lungs compared to the radiograph of 11/30/2018 and decreased in the right lung base opacity. The left lung remains clear. No large pleural effusion or pneumothorax. The cardiac silhouette is within normal limits. Atherosclerotic calcification of the aorta. There is large amount of stool throughout the colon. No bowel dilatation or evidence of obstruction. Multiple surgical clips over the abdomen and suture line in the pelvis. Degenerative changes of the spine. No acute osseous pathology. IMPRESSION: 1. Overall better aeration of the lungs compared to the radiograph of 11/30/2018 with improved right lung base density. 2. Large amount of stool throughout the colon. No bowel obstruction. Electronically Signed   By: Anner Crete M.D.   On: 12/01/2018 11:39   Ir Thoracentesis Asp Pleural Space W/img Guide  Result Date: 12/16/2018 INDICATION: Shortness of breath. Right-sided pleural effusion. Request for diagnostic therapeutic thoracentesis. EXAM: ULTRASOUND GUIDED RIGHT THORACENTESIS MEDICATIONS: None. COMPLICATIONS: None immediate. Postprocedural chest x-ray negative for pneumothorax. PROCEDURE: An ultrasound guided thoracentesis was thoroughly discussed with the patient and questions answered. The benefits, risks, alternatives and complications were also discussed. The patient understands and wishes to proceed with the procedure. Written consent was obtained. Ultrasound was performed to localize and mark an adequate pocket of fluid in the right chest. The area was then prepped and draped in the normal sterile fashion. 1% Lidocaine was used for local anesthesia. Under ultrasound guidance a 6 Fr Safe-T-Centesis catheter was introduced. Thoracentesis was performed. Patient complained of significant right-sided chest  tightness. At this point the procedure was terminated. The catheter was removed and a dressing applied. FINDINGS: A total of approximately 800 mL of cloudy dark amber fluid was removed. Samples were sent to the laboratory as requested by the clinical team. IMPRESSION: Successful ultrasound guided right thoracentesis yielding 800 mL of pleural fluid. Read by: Ascencion Dike PA-C Electronically Signed   By: Aletta Edouard M.D.   On: 12/16/2018 14:58   Ir Thoracentesis Asp Pleural Space W/img Guide  Result Date: 11/29/2018 INDICATION: Patient with recurrent symptomatic right-sided pleural effusion. Request for diagnostic and therapeutic right-sided thoracentesis. EXAM: ULTRASOUND GUIDED RIGHT THORACENTESIS MEDICATIONS: 8 mL 1% tetracaine - patient with reported anaphylactic reaction to lidocaine COMPLICATIONS: None immediate. PROCEDURE: An ultrasound guided thoracentesis was thoroughly discussed with the patient and questions answered. The benefits, risks, alternatives and complications were also discussed. The patient understands and wishes to proceed with the procedure. Written consent was obtained. Ultrasound was performed to localize and mark an adequate pocket of fluid in the right chest. The area was then prepped and draped in the normal sterile fashion. 1% Lidocaine was used for local anesthesia. Under ultrasound  guidance a 6 Fr Safe-T-Centesis catheter was introduced. Thoracentesis was performed. The catheter was removed and a dressing applied. FINDINGS: A total of approximately 1.7 L of hazy, amber fluid was removed. Samples were sent to the laboratory as requested by the clinical team. IMPRESSION: Successful ultrasound guided right thoracentesis yielding 1.7 L of pleural fluid. Read by Candiss Norse, PA-C No pneumothorax on follow-up chest radiograph. Electronically Signed   By: Lucrezia Europe M.D.   On: 11/29/2018 14:19    12/16/2018: Thoracentesis  2D echo 12/16/18 1. Left ventricular ejection  fraction, by visual estimation, is 50 to 55%. The left ventricle has normal function. Left ventricular septal wall thickness was mildly increased. There is no left ventricular hypertrophy. 2. Left ventricular diastolic parameters are consistent with Grade II diastolic dysfunction (pseudonormalization). 3. 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 4. Global right ventricle has normal systolic function.The right ventricular size is normal. No increase in right ventricular wall thickness. 5. Left atrial size was normal. 6. Right atrial size was normal. 7. Moderate calcification of the mitral valve leaflet(s). 8. Moderate mitral annular calcification. 9. Moderate thickening of the mitral valve leaflet(s). 10. The mitral valve is normal in structure. Trace mitral valve regurgitation. No evidence of mitral stenosis. 11. The tricuspid valve is normal in structure. Tricuspid valve regurgitation moderate. 12. The aortic valve is tricuspid. Aortic valve regurgitation is not visualized. No evidence of aortic valve sclerosis or stenosis. 13. There is Moderate sclerosis of the aortic valve. 14. There is Moderate thickening of the aortic valve. 15. The pulmonic valve was grossly normal. Pulmonic valve regurgitation is mild. 16. Severely elevated pulmonary artery systolic pressure. 17. The inferior vena cava is normal in size with greater than 50% respiratory variability, suggesting right atrial pressure of 3 mmHg.  Right Pleurx catheter placement on 12/20/2018 by PCCM  12/19/2018: Myoview stress test  T wave inversion was noted during stress.  Nuclear stress EF: 58%. Mild anteroapical hypokinesis.  Defect 1: There is a medium defect of moderate severity present in the mid anterior, mid anteroseptal and apical anterior location.  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  This is an  intermediate risk study based upon infarct pattern.   Subjective: Patient seen and examined at bedside.  Still complains of mild pain around the Pleurx catheter site.  Feels better enough to go home today.  No overnight fever, vomiting or worsening shortness of breath.  Discharge Exam: Vitals:   12/23/18 0633 12/23/18 0818  BP: 122/62   Pulse: 80   Resp: 18   Temp: 97.7 F (36.5 C)   SpO2: 95% 94%    General: Pt is alert, awake, not in acute distress.  Elderly female lying in bed. Cardiovascular: rate controlled, S1/S2 + Respiratory: bilateral decreased breath sounds at bases with some scattered crackles.  Right sided Pleurx catheter present Abdominal: Soft, NT, ND, bowel sounds + Extremities: Trace lower extremity edema, no cyanosis    The results of significant diagnostics from this hospitalization (including imaging, microbiology, ancillary and laboratory) are listed below for reference.     Microbiology: Recent Results (from the past 240 hour(s))  SARS CORONAVIRUS 2 (TAT 6-24 HRS) Nasopharyngeal Nasopharyngeal Swab     Status: None   Collection Time: 12/16/18  4:20 AM   Specimen: Nasopharyngeal Swab  Result Value Ref Range Status   SARS Coronavirus 2 NEGATIVE NEGATIVE Final    Comment: (NOTE) SARS-CoV-2 target nucleic acids are NOT  DETECTED. The SARS-CoV-2 RNA is generally detectable in upper and lower respiratory specimens during the acute phase of infection. Negative results do not preclude SARS-CoV-2 infection, do not rule out co-infections with other pathogens, and should not be used as the sole basis for treatment or other patient management decisions. Negative results must be combined with clinical observations, patient history, and epidemiological information. The expected result is Negative. Fact Sheet for Patients: SugarRoll.be Fact Sheet for Healthcare Providers: https://www.woods-mathews.com/ This test is not yet  approved or cleared by the [place] FDA and  has been authorized for detection and/or diagnosis of SARS-CoV-2 by FDA under an Emergency Use Authorization (EUA). This EUA will remain  in effect (meaning this test can be used) for the duration of the COVID-19 declaration under Section 56 4(b)(1) of the Act, 21 U.S.C. section 360bbb-3(b)(1), unless the authorization is terminated or revoked sooner. Performed at [facility], [place] [address], [place], [place] [address]   Gram stain     Status: None   Collection Time: 12/16/18  2:54 PM   Specimen: Lung, Right; Pleural Fluid  Result Value Ref Range Status   Specimen Description FLUID RIGHT PLEURAL  Final   Special Requests NONE  Final   Gram Stain   Final    MODERATE WBC PRESENT, PREDOMINANTLY PMN NO ORGANISMS SEEN Performed at [facility], [place] [address], [place], [place] [address]    Report Status 12/07/2018 FINAL  Final  Culture, body fluid-bottle     Status: None   Collection Time: 12/16/18  2:54 PM   Specimen: Fluid  Result Value Ref Range Status   Specimen Description FLUID RIGHT PLEURAL  Final   Special Requests BOTTLES DRAWN AEROBIC AND ANAEROBIC 10CC  Final   Culture   Final    NO GROWTH 5 DAYS Performed at [facility], [place] [address] [place], [place] [address]    Report Status 12/21/2018 FINAL  Final  Culture, sputum-assessment     Status: None   Collection Time: 12/18/18  4:47 AM   Specimen: Sputum  Result Value Ref Range Status   Specimen Description SPUTUM  Final   Special Requests NONE  Final   Sputum evaluation   Final    Sputum specimen not acceptable for testing.  Please recollect.   Results Called to: Frankey Poot, RN AT [phone] ON 12/18/18 BY C. JESSUP, MT. Performed at [facility], [place] [address] [place], [place] [address]    Report Status 12/18/2018 FINAL  Final     Labs: BNP (last 3 results) Recent Labs    12/16/2018 0225  BNP 588.5*   Basic Metabolic Panel: Recent  Labs  Lab 11/29/2018 0225 12/18/18 0313 12/19/18 0304 12/20/18 0056 12/21/18 0243 12/22/18 0313  NA 141 137 139 140 141 131*  K 4.7 4.4 4.0 4.2 3.7 4.8  CL 105 103 105 106 105 96*  CO2 22 23 24 22 25  21*  GLUCOSE 165* 211* 299* 197* 146* 217*  BUN 24* 35* 28* 19 18 28*  CREATININE 1.05* 1.16* 1.02* 0.70 0.78 1.48*  CALCIUM 8.6* 8.2* 8.2* 8.2* 8.4* 7.6*  MG 1.5* 2.1 2.0 1.8  --  1.7   Liver Function Tests: Recent Labs  Lab 12/13/2018 0225  AST 22  ALT 10  ALKPHOS 63  BILITOT 0.7  PROT 6.5  ALBUMIN 2.8*   No results for input(s): LIPASE, AMYLASE in the last 168 hours. No results for input(s): AMMONIA in the last 168 hours.  CBC: Recent Labs  Lab 12/19/18 0304 12/20/18 0056 12/21/18 0243 12/22/18 0313 12/23/18 0212  WBC 15.8* 11.8* 14.0* 21.1* 20.2*  NEUTROABS 12.9* 9.6* 10.1* 16.5* 16.5*  HGB 11.2* 11.9* 12.2 13.0 12.3  HCT 33.8* 36.9 37.9 39.4 38.2  MCV 84.3 85.8 86.7 84.5 85.8  PLT 341 404* 393 372 398   Cardiac Enzymes: No results for input(s): CKTOTAL, CKMB, CKMBINDEX, TROPONINI in the last 168 hours. BNP: Invalid input(s): POCBNP CBG: Recent Labs  Lab 12/22/18 0754 12/22/18 1240 12/22/18 1634 12/22/18 2112 12/23/18 0827  GLUCAP 222* 259* 231* 153* 176*   D-Dimer No results for input(s): DDIMER in the last 72 hours. Hgb A1c No results for input(s): HGBA1C in the last 72 hours. Lipid Profile No results for input(s): CHOL, HDL, LDLCALC, TRIG, CHOLHDL, LDLDIRECT in the last 72 hours. Thyroid function studies No results for input(s): TSH, T4TOTAL, T3FREE, THYROIDAB in the last 72 hours.  Invalid input(s): FREET3 Anemia work up No results for input(s): VITAMINB12, FOLATE, FERRITIN, TIBC, IRON, RETICCTPCT in the last 72 hours. Urinalysis    Component Value Date/Time   COLORURINE YELLOW 11/07/2018 0001   APPEARANCEUR HAZY (A) 11/07/2018 0001   LABSPEC 1.019 11/07/2018 0001   PHURINE 6.0 11/07/2018 0001   GLUCOSEU 50 (A) 11/07/2018 0001   GLUCOSEU  NEGATIVE 08/31/2012 0836   HGBUR NEGATIVE 11/07/2018 0001   BILIRUBINUR NEGATIVE 11/07/2018 0001   KETONESUR NEGATIVE 11/07/2018 0001   PROTEINUR >=300 (A) 11/07/2018 0001   UROBILINOGEN 0.2 05/31/2014 1402   NITRITE NEGATIVE 11/07/2018 0001   LEUKOCYTESUR NEGATIVE 11/07/2018 0001   Sepsis Labs Invalid input(s): PROCALCITONIN,  WBC,  LACTICIDVEN Microbiology Recent Results (from the past 240 hour(s))  SARS CORONAVIRUS 2 (TAT 6-24 HRS) Nasopharyngeal Nasopharyngeal Swab     Status: None   Collection Time: 12/16/18  4:20 AM   Specimen: Nasopharyngeal Swab  Result Value Ref Range Status   SARS Coronavirus 2 NEGATIVE NEGATIVE Final    Comment: (NOTE) SARS-CoV-2 target nucleic acids are NOT DETECTED. The SARS-CoV-2 RNA is generally detectable in upper and lower respiratory specimens during the acute phase of infection. Negative results do not preclude SARS-CoV-2 infection, do not rule out co-infections with other pathogens, and should not be used as the sole basis for treatment or other patient management decisions. Negative results must be combined with clinical observations, patient history, and epidemiological information. The expected result is Negative. Fact Sheet for Patients: SugarRoll.be Fact Sheet for Healthcare Providers: https://www.woods-mathews.com/ This test is not yet approved or cleared by the [place] FDA and  has been authorized for detection and/or diagnosis of SARS-CoV-2 by FDA under an Emergency Use Authorization (EUA). This EUA will remain  in effect (meaning this test can be used) for the duration of the COVID-19 declaration under Section 56 4(b)(1) of the Act, 21 U.S.C. section 360bbb-3(b)(1), unless the authorization is terminated or revoked sooner. Performed at [facility], [place] [address], [place], [place] [address]   Gram stain     Status: None   Collection Time: 12/16/18  2:54 PM   Specimen: Lung,  Right; Pleural Fluid  Result Value Ref Range Status   Specimen Description FLUID RIGHT PLEURAL  Final   Special Requests NONE  Final   Gram Stain   Final    MODERATE WBC PRESENT, PREDOMINANTLY PMN NO ORGANISMS SEEN Performed at [facility], [place] [address] [place], [place] [address]    Report Status 11/30/2018 FINAL  Final  Culture, body fluid-bottle  Status: None   Collection Time: 12/16/18  2:54 PM   Specimen: Fluid  Result Value Ref Range Status   Specimen Description FLUID RIGHT PLEURAL  Final   Special Requests BOTTLES DRAWN AEROBIC AND ANAEROBIC 10CC  Final   Culture   Final    NO GROWTH 5 DAYS Performed at [facility], [place] [address] [place], [place] [address]    Report Status 12/21/2018 FINAL  Final  Culture, sputum-assessment     Status: None   Collection Time: 12/18/18  4:47 AM   Specimen: Sputum  Result Value Ref Range Status   Specimen Description SPUTUM  Final   Special Requests NONE  Final   Sputum evaluation   Final    Sputum specimen not acceptable for testing.  Please recollect.   Results Called to: Frankey Poot, RN AT [phone] ON 12/18/18 BY C. JESSUP, MT. Performed at [facility], [place] [address] [place], [place] [address]    Report Status 12/18/2018 FINAL  Final     Time coordinating discharge: 35 minutes  SIGNED:   Aline August, MD  [facility] 12/23/2018, 10:51 AM

## 2018-12-23 NOTE — TOC Transition Note (Addendum)
Transition of Care [facility]) - CM/SW Discharge Note   Patient Details  Name: NETA UPADHYAY MRN: [id number] Date of Birth: 1940/03/07  Transition of Care [facility]) CM/SW Contact:  Sharin Mons, RN Phone Number: 12/23/2018, 9:50 AM   Clinical Narrative:    Admitted with recurrent right pleural effusion, malignant. Hx of rheumatoid arthritis with chronic pain, COPD, diabetes mellitus type 2, hypertension and anxiety. Pt will transition to home today. Pt resides with son Dorse. Bedside nurse is to provide Dorse pleurx cath teaching prior to d/c. Home health services are in place. Pt will d/c with pleurx bottles ( case of 10), already @ bedside. Home health  services will be provided by [facility], liaison made aware of d/c plan. Pt's pleural drainage instruction:  Pleural drainage schedule to start 12/22/2018. Drain daily, up to max of 1L 3 times a week If <158ml for 3 consecutive drains then call the practice that inserted the catheter for evaluation and possible removal. [facility] [phone]      DME home  oxygen will be delivered to bedside prior to d/c.   [facility] community resource , will follow telephonically.  Pt will follow up scheduled with [facility]:  [facility]  Pulmonology [phone] [place] [phone]    Next Steps: Go on 12/27/2018    Instructions: Please follow up with Dr.Icard on 12/27/2018 at 2 pm for wound check, suture removal     Pt has transportation to home.    12/23/2018 @ 3:30pm NCM spoke with Dr.Ramaswamy to clarify  Pleural drainage schedule. Dr. Chase Caller stated pleurx catheter will need draining 3x per week, less than or equal to 1L each time.  Please call [facility] with questions, [phone]. NCM made [facility] liaison aware.   Final next level of care: [place] Barriers to Discharge: No Barriers Identified   Patient Goals and CMS  Choice Patient states their goals for this hospitalization and ongoing recovery are:: to go home CMS Medicare.gov Compare Post Acute Care list provided to:: Patient Choice offered to / list presented to : Patient(Pt already active with [facility])  Discharge Placement     Discharge Plan and Services   Discharge Planning Services: CM Consult            DME Arranged: Oxygen DME Agency: [employer] Date DME Agency Contacted: 12/22/18 Time DME Agency Contacted: 1962 Representative spoke with at DME Agency: [place]: RN, PT [place] Agency: [place] Date [place]: 12/21/18 Time [place]: [phone] Representative spoke with at [place]: [place] ([place]) Interventions     Readmission Risk Interventions No flowsheet data found.

## 2018-12-23 NOTE — Progress Notes (Signed)
D./w [facility] - dc today. 1L  Removed yesterday via pleurx. Has followup with Dr Valeta Harms. CCM will sign off     SIGNATURE    Dr. Brand Males, M.D., F.C.C.P,  Pulmonary and Critical Care Medicine Staff Physician, [place] Director - Interstitial Lung Disease  Program  Pulmonary [place] [facility] [place], [place], [address]  Pager: [phone], If no answer or between  15:00h - 7:00h: call [phone] Telephone: [phone]  11:01 AM 12/23/2018

## 2018-12-23 NOTE — Progress Notes (Addendum)
Pt given discharge instructions, prescriptions, and care notes. Pt verbalized understanding AEB no further questions or concerns at this time. Pt's son instructed on how to drain and change pt's pleurex. Pt's pleurex output was 64mL. IVs were discontinued, no redness, pain, or swelling noted at this time. Pt left the floor via wheelchair with staff in stable condition.

## 2018-12-23 NOTE — Consult Note (Signed)
   [facility] CM Inpatient Consult   12/23/2018  Leslie Gamble 01-Mar-1940 [id number]  Patient assessed for extreme high risk score for unplanned readmission score and for a less than 30 days admission hospitalization.  Called to patient's hospital room and HIPAA verified, to check if potential restart of[facility] [facility] services. Patient in the Medicare [employer] ACO.  Explained to her the difference between acute home health and chronic care management for follow up. Patient verbally agreed to post hospital follow up for complex disease management.  She has [facility] and made aware that this doesn't interfere with home health. She lives with her son, Dorse per inpatient [facility] RNCM, who he and her daughter, Lester  (lives in [place]) have been taught on the pleurx drain care.   Review of patient's medical record reveals patient is also on home oxygen with HX of COPD with recurrent right pleural effusion.   Patient states she gets Meals on Wheels weekly and had no food issues that she could think of.  Primary Care Provider YI:RSWNIO, Denton Ar, MD and this primary care provider's office is listed to do the Transition of Care follow up.   Pharmacy is: [facility], [place], patient states she has no medications issues she could think off.  Transportation to provider: family  Plan:  Get patient assigned for chronic complex disease management with [place].   For questions contact:   Natividad Brood, RN BSN [facility] Liaison  [phone] business mobile phone Toll free office [phone]  Fax number: [phone] [email or web address]

## 2018-12-24 ENCOUNTER — Emergency Department (HOSPITAL_COMMUNITY): Payer: Medicare Other

## 2018-12-24 ENCOUNTER — Emergency Department (HOSPITAL_COMMUNITY)
Admission: EM | Admit: 2018-12-24 | Discharge: 2019-01-20 | Disposition: E | Payer: Medicare Other | Attending: Emergency Medicine | Admitting: Emergency Medicine

## 2018-12-24 ENCOUNTER — Encounter (HOSPITAL_COMMUNITY): Payer: Self-pay | Admitting: Emergency Medicine

## 2018-12-24 DIAGNOSIS — Z79899 Other long term (current) drug therapy: Secondary | ICD-10-CM | POA: Insufficient documentation

## 2018-12-24 DIAGNOSIS — J449 Chronic obstructive pulmonary disease, unspecified: Secondary | ICD-10-CM | POA: Insufficient documentation

## 2018-12-24 DIAGNOSIS — Z87891 Personal history of nicotine dependence: Secondary | ICD-10-CM | POA: Diagnosis not present

## 2018-12-24 DIAGNOSIS — I469 Cardiac arrest, cause unspecified: Secondary | ICD-10-CM | POA: Diagnosis not present

## 2018-12-24 DIAGNOSIS — I10 Essential (primary) hypertension: Secondary | ICD-10-CM | POA: Diagnosis not present

## 2018-12-24 DIAGNOSIS — E1165 Type 2 diabetes mellitus with hyperglycemia: Secondary | ICD-10-CM | POA: Diagnosis not present

## 2018-12-24 DIAGNOSIS — Z7982 Long term (current) use of aspirin: Secondary | ICD-10-CM | POA: Insufficient documentation

## 2018-12-24 DIAGNOSIS — E119 Type 2 diabetes mellitus without complications: Secondary | ICD-10-CM | POA: Diagnosis not present

## 2018-12-24 DIAGNOSIS — J45909 Unspecified asthma, uncomplicated: Secondary | ICD-10-CM | POA: Diagnosis not present

## 2018-12-24 DIAGNOSIS — R001 Bradycardia, unspecified: Secondary | ICD-10-CM | POA: Diagnosis not present

## 2018-12-24 DIAGNOSIS — I959 Hypotension, unspecified: Secondary | ICD-10-CM | POA: Diagnosis not present

## 2018-12-24 DIAGNOSIS — J80 Acute respiratory distress syndrome: Secondary | ICD-10-CM | POA: Diagnosis not present

## 2018-12-24 DIAGNOSIS — R000 Tachycardia, unspecified: Secondary | ICD-10-CM | POA: Diagnosis not present

## 2018-12-24 MED ORDER — EPINEPHRINE 1 MG/10ML IJ SOSY
PREFILLED_SYRINGE | INTRAMUSCULAR | Status: AC | PRN
  Administered 2018-12-24 (×5): 1 via INTRAVENOUS

## 2018-12-24 MED ORDER — SODIUM BICARBONATE 8.4 % IV SOLN
INTRAVENOUS | Status: AC | PRN
  Administered 2018-12-24: 50 meq via INTRAVENOUS

## 2018-12-24 MED FILL — Medication: Qty: 1 | Status: AC

## 2018-12-26 ENCOUNTER — Inpatient Hospital Stay: Payer: Medicare Other | Admitting: Internal Medicine

## 2018-12-26 ENCOUNTER — Other Ambulatory Visit: Payer: Self-pay | Admitting: *Deleted

## 2018-12-26 NOTE — Patient Outreach (Signed)
[facility]) Care Management  12/26/2018  KAILEEN BRONKEMA 1940-05-24 [id number]   Referral received from hospital liaison to follow up with member after discharge.  Noted member discharged home on 12/4 but unfortunately presented back to the ED on 12/5 in cardiac arrest.  Will not open case as member is now deceased.  Valente David, [place], MSN [place] [phone]

## 2018-12-27 ENCOUNTER — Inpatient Hospital Stay: Payer: Medicare Other | Admitting: Pulmonary Disease

## 2018-12-27 NOTE — Telephone Encounter (Signed)
Pt deceased. Will close encounter.  °

## 2019-01-10 ENCOUNTER — Ambulatory Visit: Payer: Medicare Other | Admitting: Physician Assistant

## 2019-01-20 NOTE — Progress Notes (Signed)
Chaplain responded to post cpr death consult for family support. Chaplain met son Leslie Gamble in ED consult Rm A. Leslie Gamble contacted siblings Darren and Levette. Chaplain was asked to return in 30 minutes. Chaplain returned at 1015a once all siblings had arrived. Chaplain, RN, and MD all consulted with the family. Chaplain prayed with the family and stayed with them during the viewing of Leslie Gamble's body.   Chaplain had been miss informed about the status of Leslie Gamble's death, which lead Chaplain to miss inform the family that Leslie Gamble was a ME case. Chaplain went to MD and RN for clarification. Chaplain was told the death was not a ME case and all was well. Chaplain relayed that information to the family, due to the MD having a new cpr case in progress. Family may call later with questions, but as it is currently the family said all is well and they are pleased with the care their mother Leslie Gamble has received.   Siblings: Darren McGill [place] [phone] [address], [place]  [place] [address].  Funeral Home: [facility] in [place] remains available for support as needs arise.   Chaplain Resident, Evelene Croon, M Div

## 2019-01-20 NOTE — Code Documentation (Signed)
Intubation successful

## 2019-01-20 NOTE — Code Documentation (Signed)
Rhythm check, asystole, CPR resumed

## 2019-01-20 NOTE — Code Documentation (Signed)
Pulse check, asystole

## 2019-01-20 NOTE — ED Notes (Signed)
Help get  patient on the monitor on the Zoll pads and help with compressions
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
## 2019-01-20 NOTE — Progress Notes (Signed)
Late RT note: RT received patient from EMS cpr in progress. RT assisted MD with intubation. RT placed patient on vent during cpr do to COVID R/O. patients ETCO2 was 33 post intubation and had return volumes on ventilator. RT continued to monitor until time of death called.

## 2019-01-20 NOTE — Code Documentation (Signed)
CPR in progress, pt being bagged.

## 2019-01-20 NOTE — ED Triage Notes (Signed)
Pt bib ems from home, c/o sob, respiratory arrested with ems, being bagged. Discharged yesterday with "fluid on her lungs", cardiac arrest en route. cpr in progress on arrival to room. IO in place.

## 2019-01-20 NOTE — Code Documentation (Signed)
Patient time of death occurred at 16.

## 2019-01-20 DEATH — deceased

## 2019-02-21 ENCOUNTER — Ambulatory Visit (HOSPITAL_COMMUNITY): Payer: Medicare Other | Admitting: Psychiatry

## 2020-09-30 IMAGING — DX DG CHEST 2V
2 series · 2 of 2 positions shown · non-contrast
Comparison: 11/06/2018

CLINICAL DATA: Evaluate pleural effusions. Shortness of breath.

EXAM:
CHEST - 2 VIEW

[chest lat]
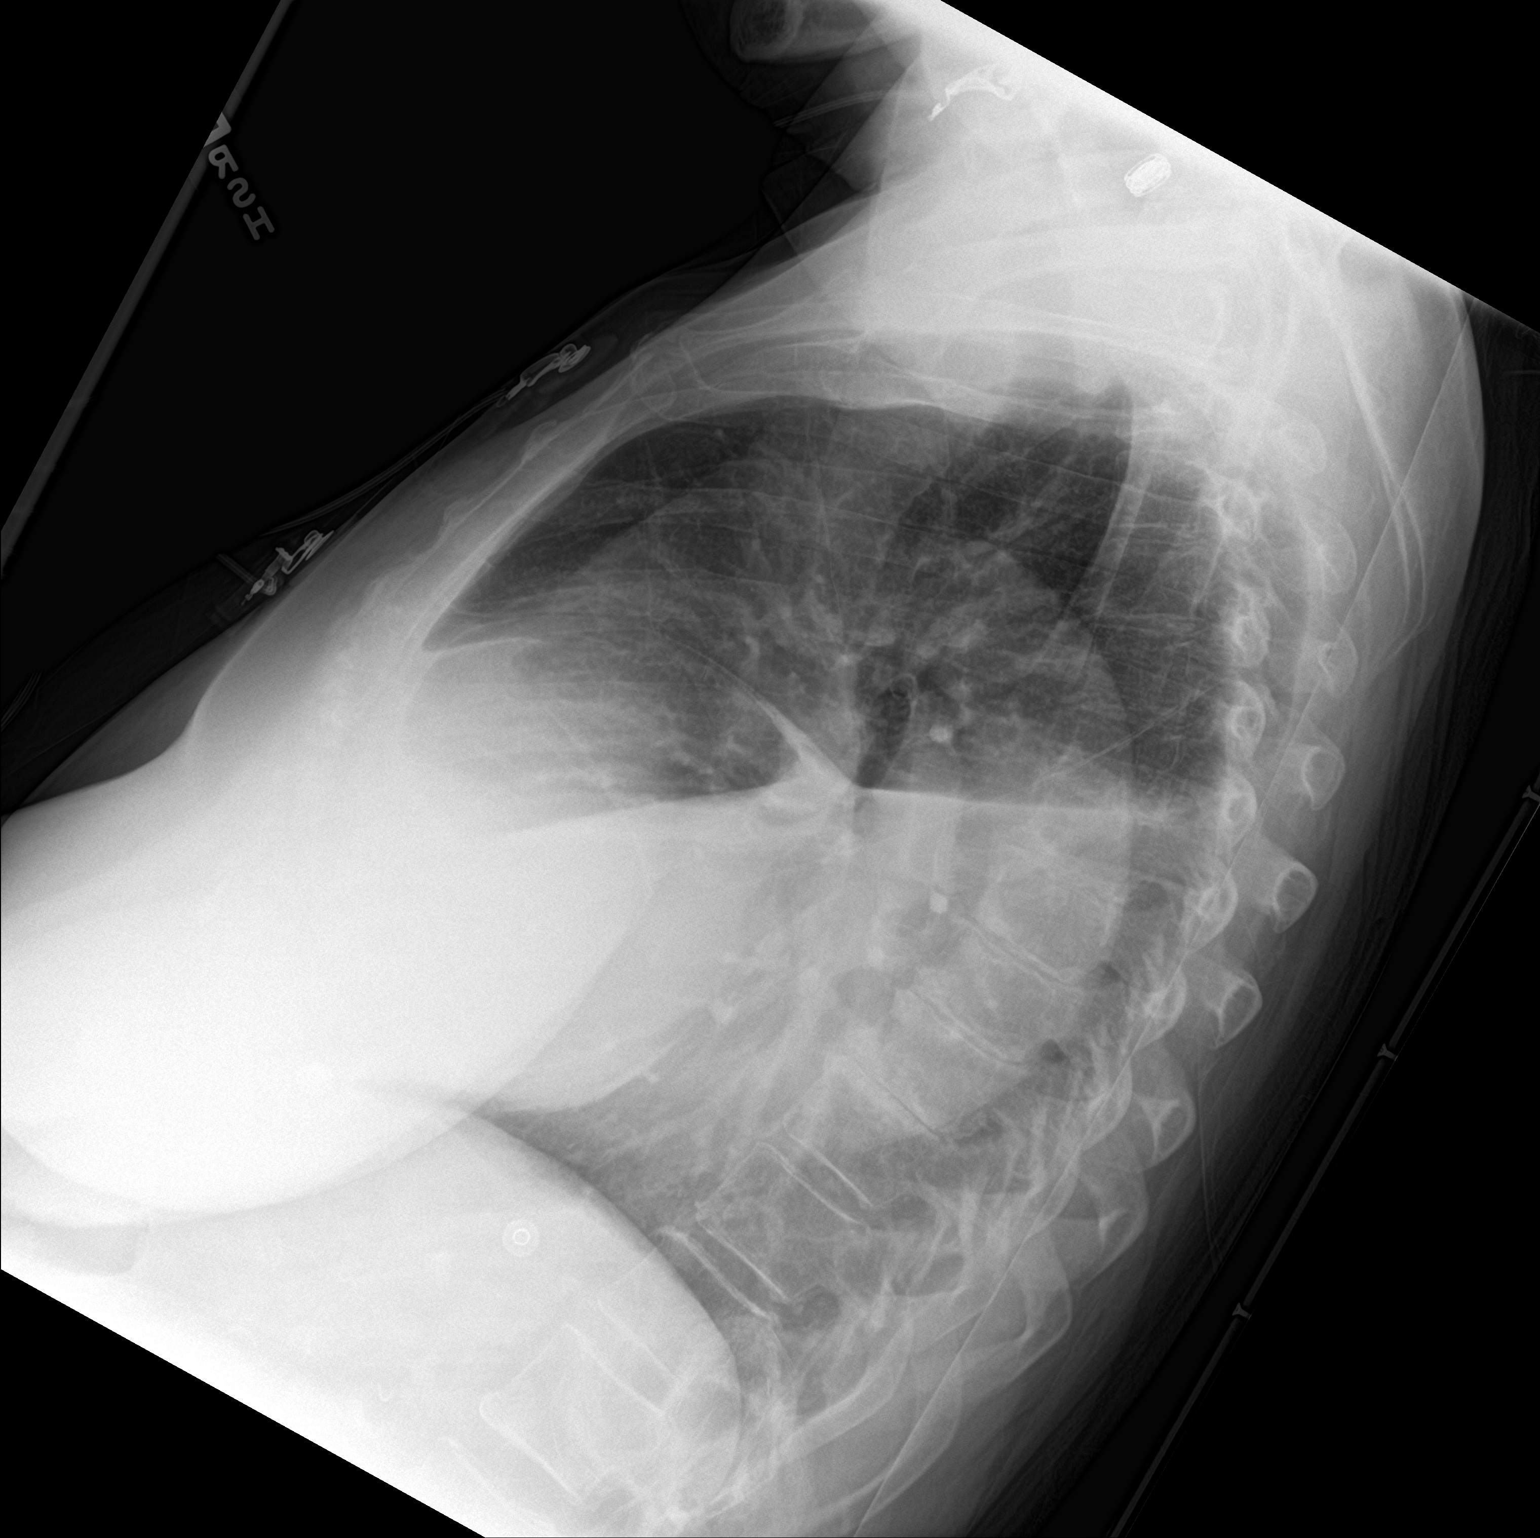

[chest ap]
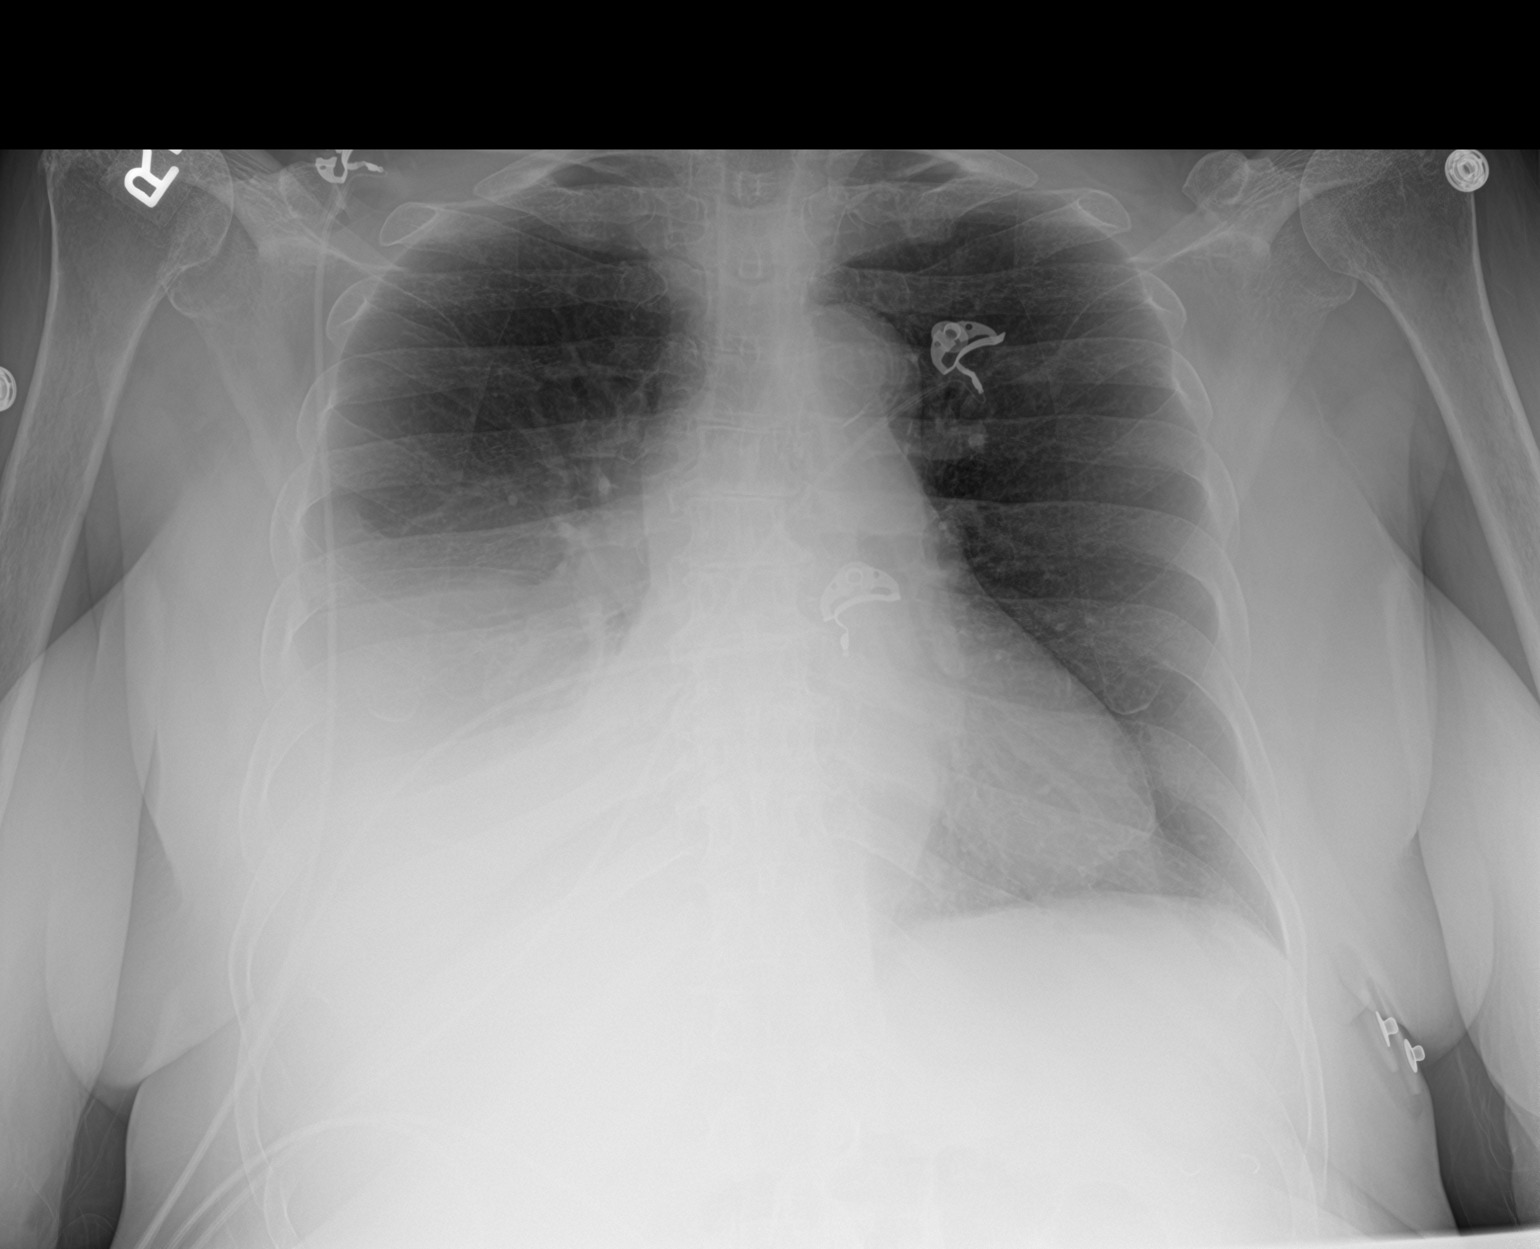

[2 of 2 positions shown; findings below may reference images not displayed]

FINDINGS: Are normal heart size. Aortic atherosclerosis. There is a persistent
right pleural effusion. Volume loss, scratch set atelectasis or
consolidation within the right base is identified. Left lung is
clear.
IMPRESSION: 1. No change in right pleural effusion.
2. Persistent right base atelectasis or consolidation. Consider
further evaluation with contrast enhanced CT of the chest. To assess
for underlying predisposing abnormality

## 2020-10-01 IMAGING — CT CT CHEST W/ CM
2 of 3 series · 15 of 36 positions shown, 18 images · IV contrast (APPLIED)
Comparison: Radiographs yesterday, as well as 11/06/2018. No prior
CT.

CLINICAL DATA: Pleural effusion. Right lower lobe opacity.

EXAM:
CT CHEST WITH CONTRAST
TECHNIQUE: Multidetector CT imaging of the chest was performed during
intravenous contrast administration.
CONTRAST:  75mL OMNIPAQUE IOHEXOL 300 MG/ML  SOLN

[Series 3: thorax 2.0 i31f 2 · axial · 0.77mm/px · z∈[-856,-602]mm · 12 of 149 slices shown, 15 images]
[im 11/149  mediastinal]
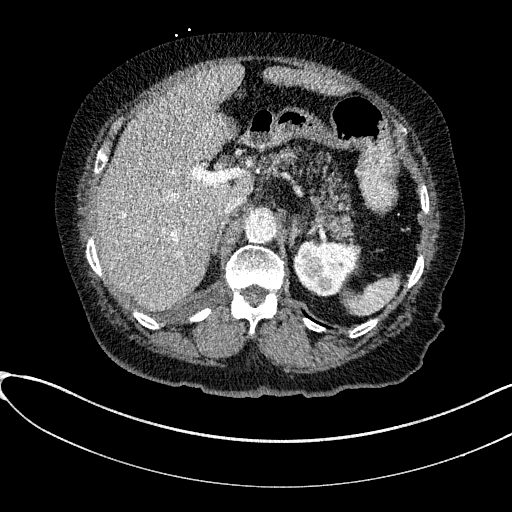
[im 11/149  lung]
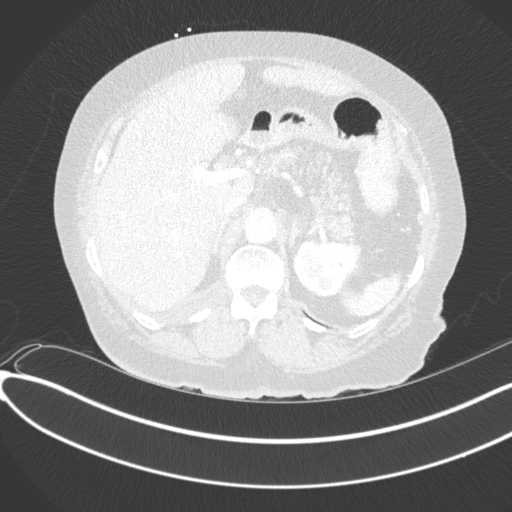
[im 22/149  lung]
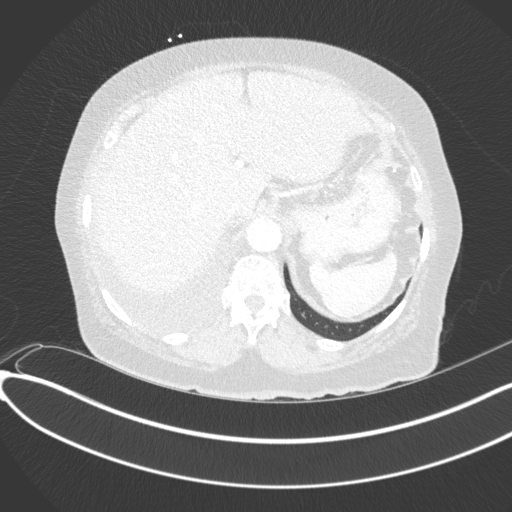
[im 33/149  lung]
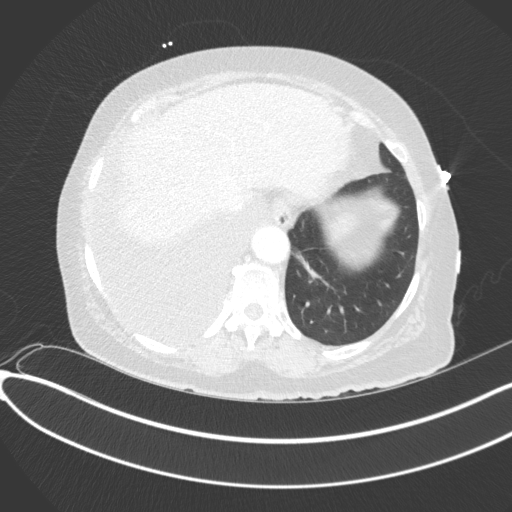
[im 44/149  lung]
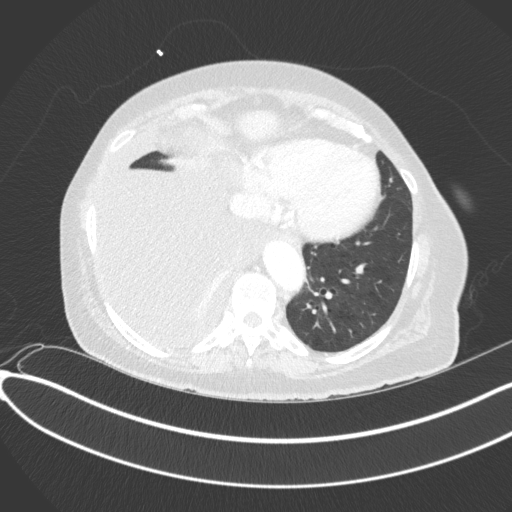
[im 55/149  mediastinal]
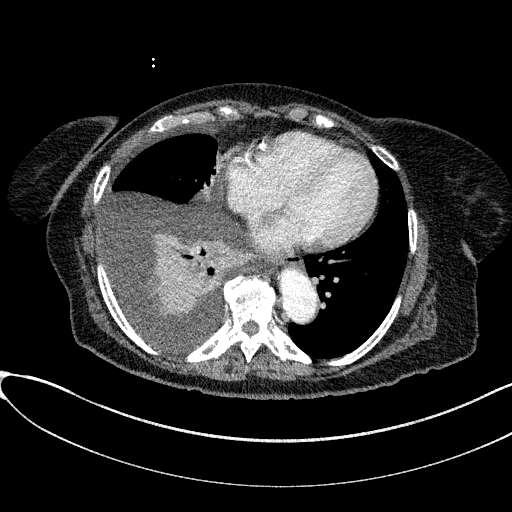
[im 55/149  lung]
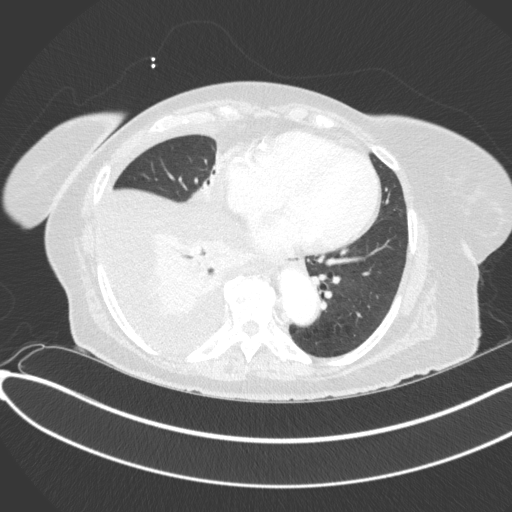
[im 66/149  lung]
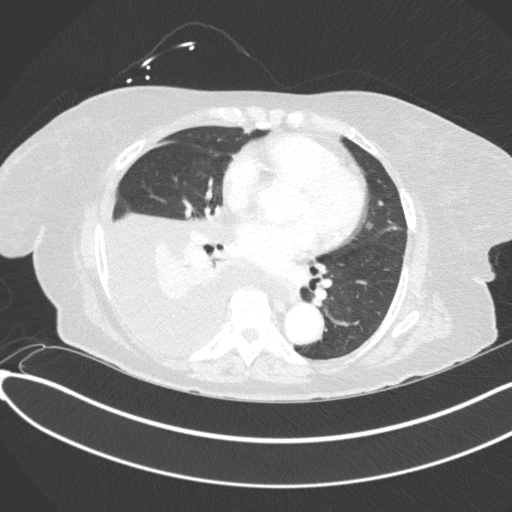
[im 83/149  lung]
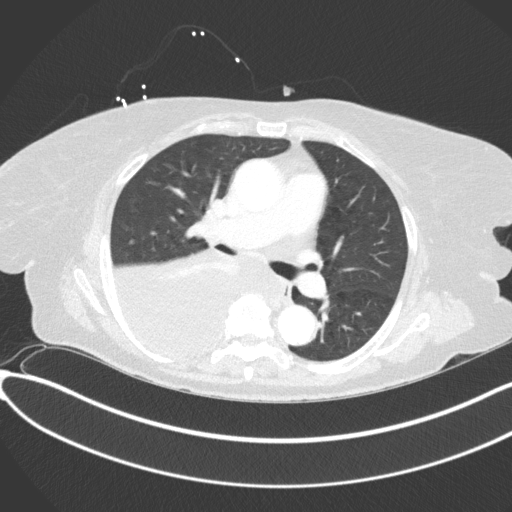
[im 94/149  lung]
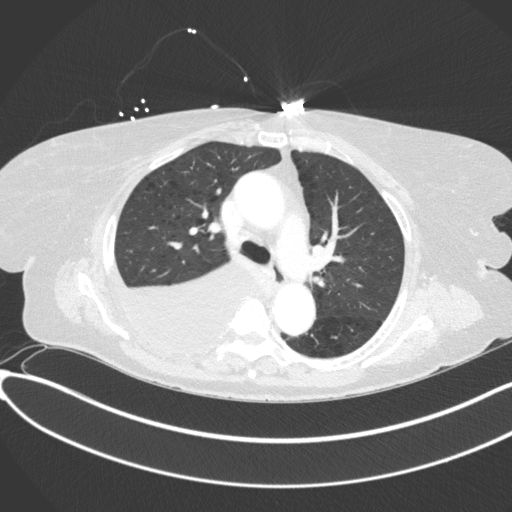
[im 105/149  mediastinal]
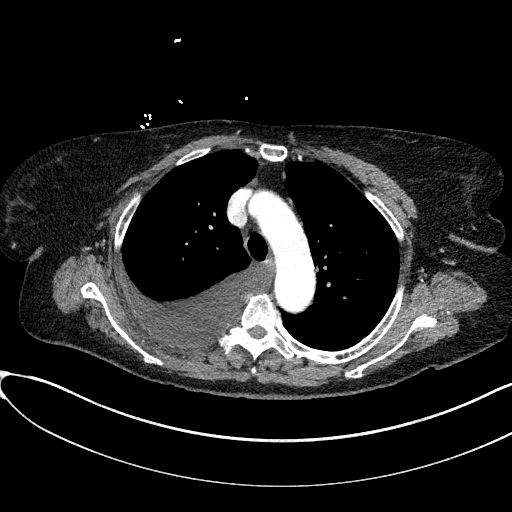
[im 105/149  lung]
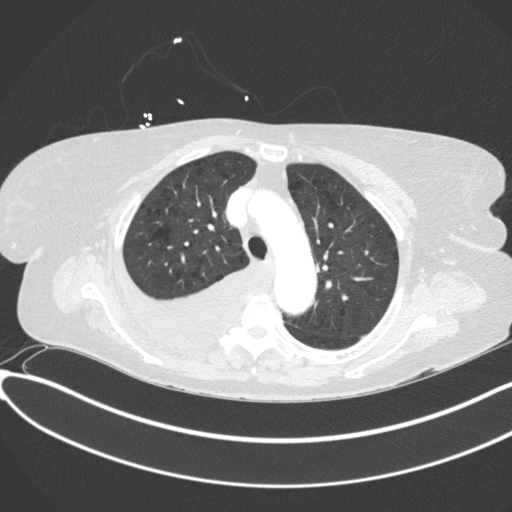
[im 116/149  lung]
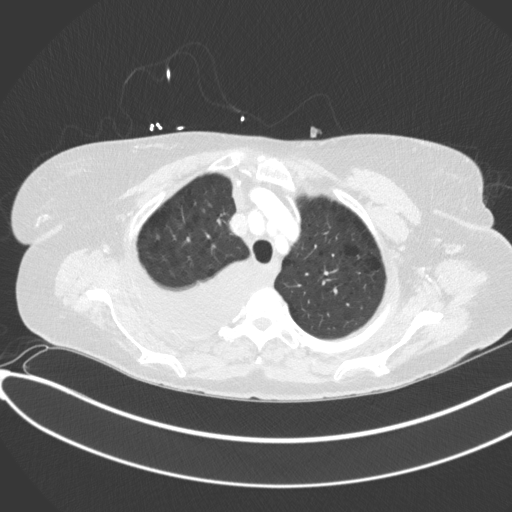
[im 127/149  lung]
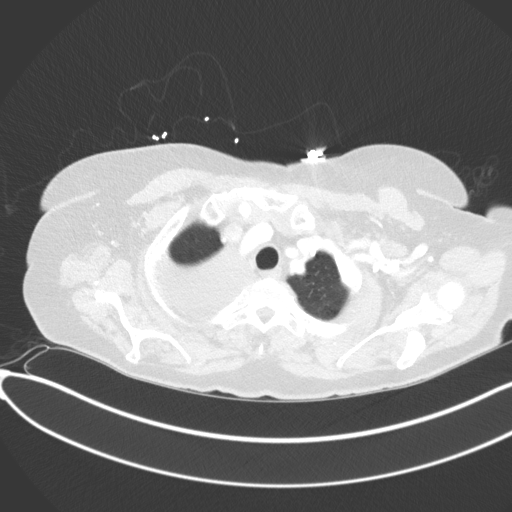
[im 138/149  lung]
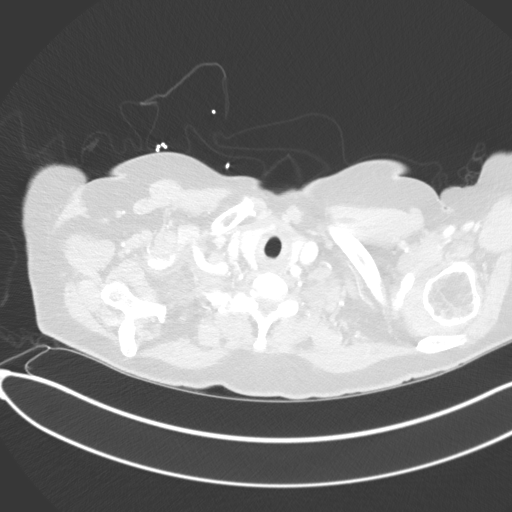

[Series 5: coronal · coronal · 0.62mm/px · 3 of 160 slices shown]
[im 32/160  lung]
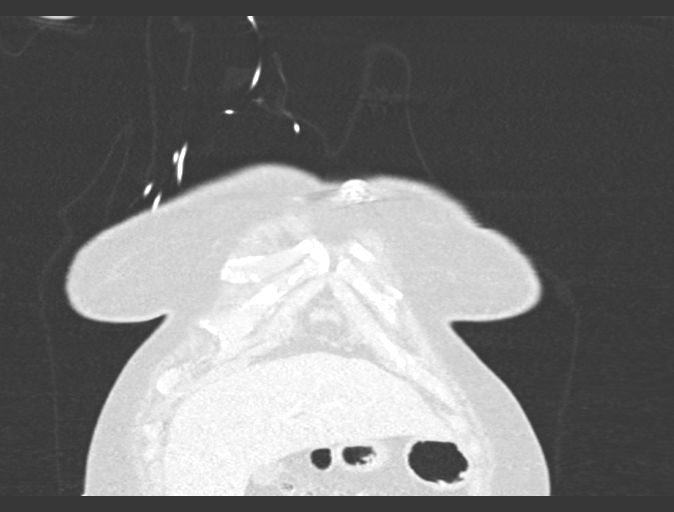
[im 64/160  lung]
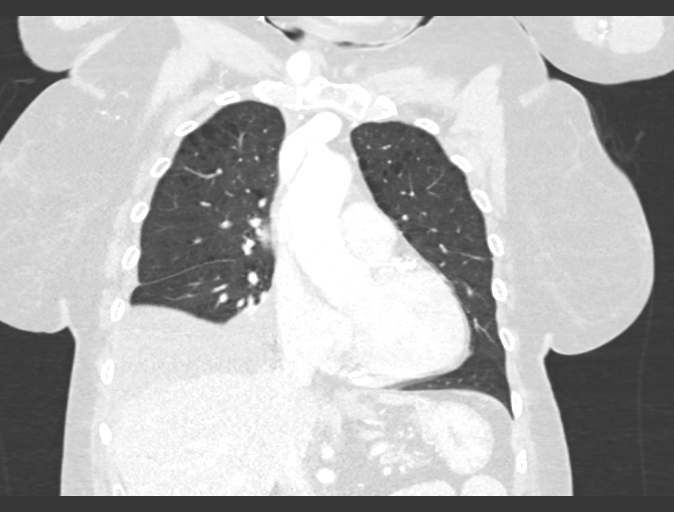
[im 96/160  lung]
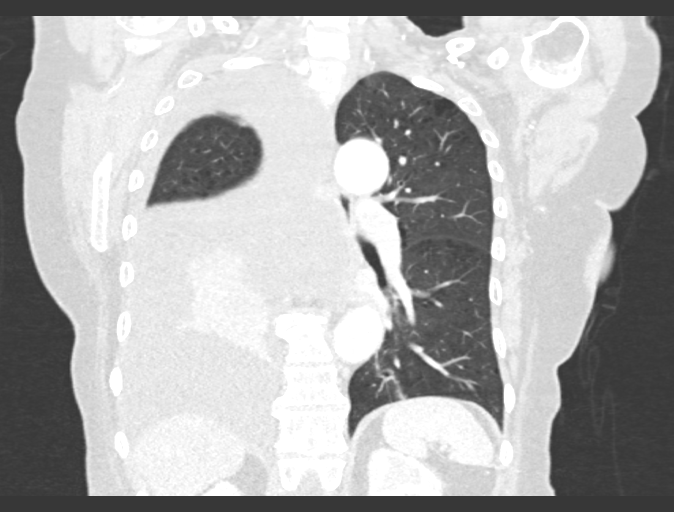

[15 of 36 positions shown; findings below may reference images not displayed]

FINDINGS: Cardiovascular: Aortic atherosclerosis. No aneurysm. Descending
thoracic aorta is tortuous. Normal heart size. There are coronary
artery calcifications. No pericardial effusion. Physiologic fluid in
the superior pericardial recess.

Mediastinum/Nodes: No enlarged mediastinal or hilar lymph nodes. No
esophageal wall thickening. No visualized thyroid nodule.

Lungs/Pleura: Moderate-sized free-flowing right pleural effusion.
Pleural fluid measures simple fluid density. There is associated
compressive atelectasis, including complete atelectasis of the right
lower lobe. This limits parenchymal assessment. Questionable pleural
enhancement, series 3, image 104. Mild emphysema. No evidence of
pneumonia. Linear subsegmental atelectasis in the lingula and
dependent left lower lobe. No left pleural effusion. No pulmonary
edema. No evidence of pulmonary mass. Minimal dependent mucus in the
trachea.

Upper Abdomen: Upper abdominal atherosclerosis. Mild fatty atrophy
of the pancreas. No acute findings.

Musculoskeletal: There are no acute or suspicious osseous
abnormalities.
IMPRESSION: 1. Moderate-sized free-flowing right pleural effusion with adjacent
compressive atelectasis, including complete atelectasis of the right
lower lobe. Questionable area of pleural enhancement in the right
lower lobe which may be inflammatory, however neoplastic enhancement
is not excluded. There are no other findings to suggest neoplasm in
thorax. Consider fluid sampling.
2. Mild emphysema.
3. Aortic atherosclerosis.  Coronary artery calcifications.

Aortic Atherosclerosis (XQFHZ-GBT.T) and Emphysema (XQFHZ-FO6.2).

## 2020-10-19 IMAGING — DX DG CHEST 2V
2 series · 2 of 2 positions shown · non-contrast
Comparison: Chest x-rays dated 11/09/2018 and 11/10/2018

CLINICAL DATA: Pneumonia. Severe shortness of breath.

EXAM:
CHEST - 2 VIEW

[dg chest 2 view (1 of 2)]
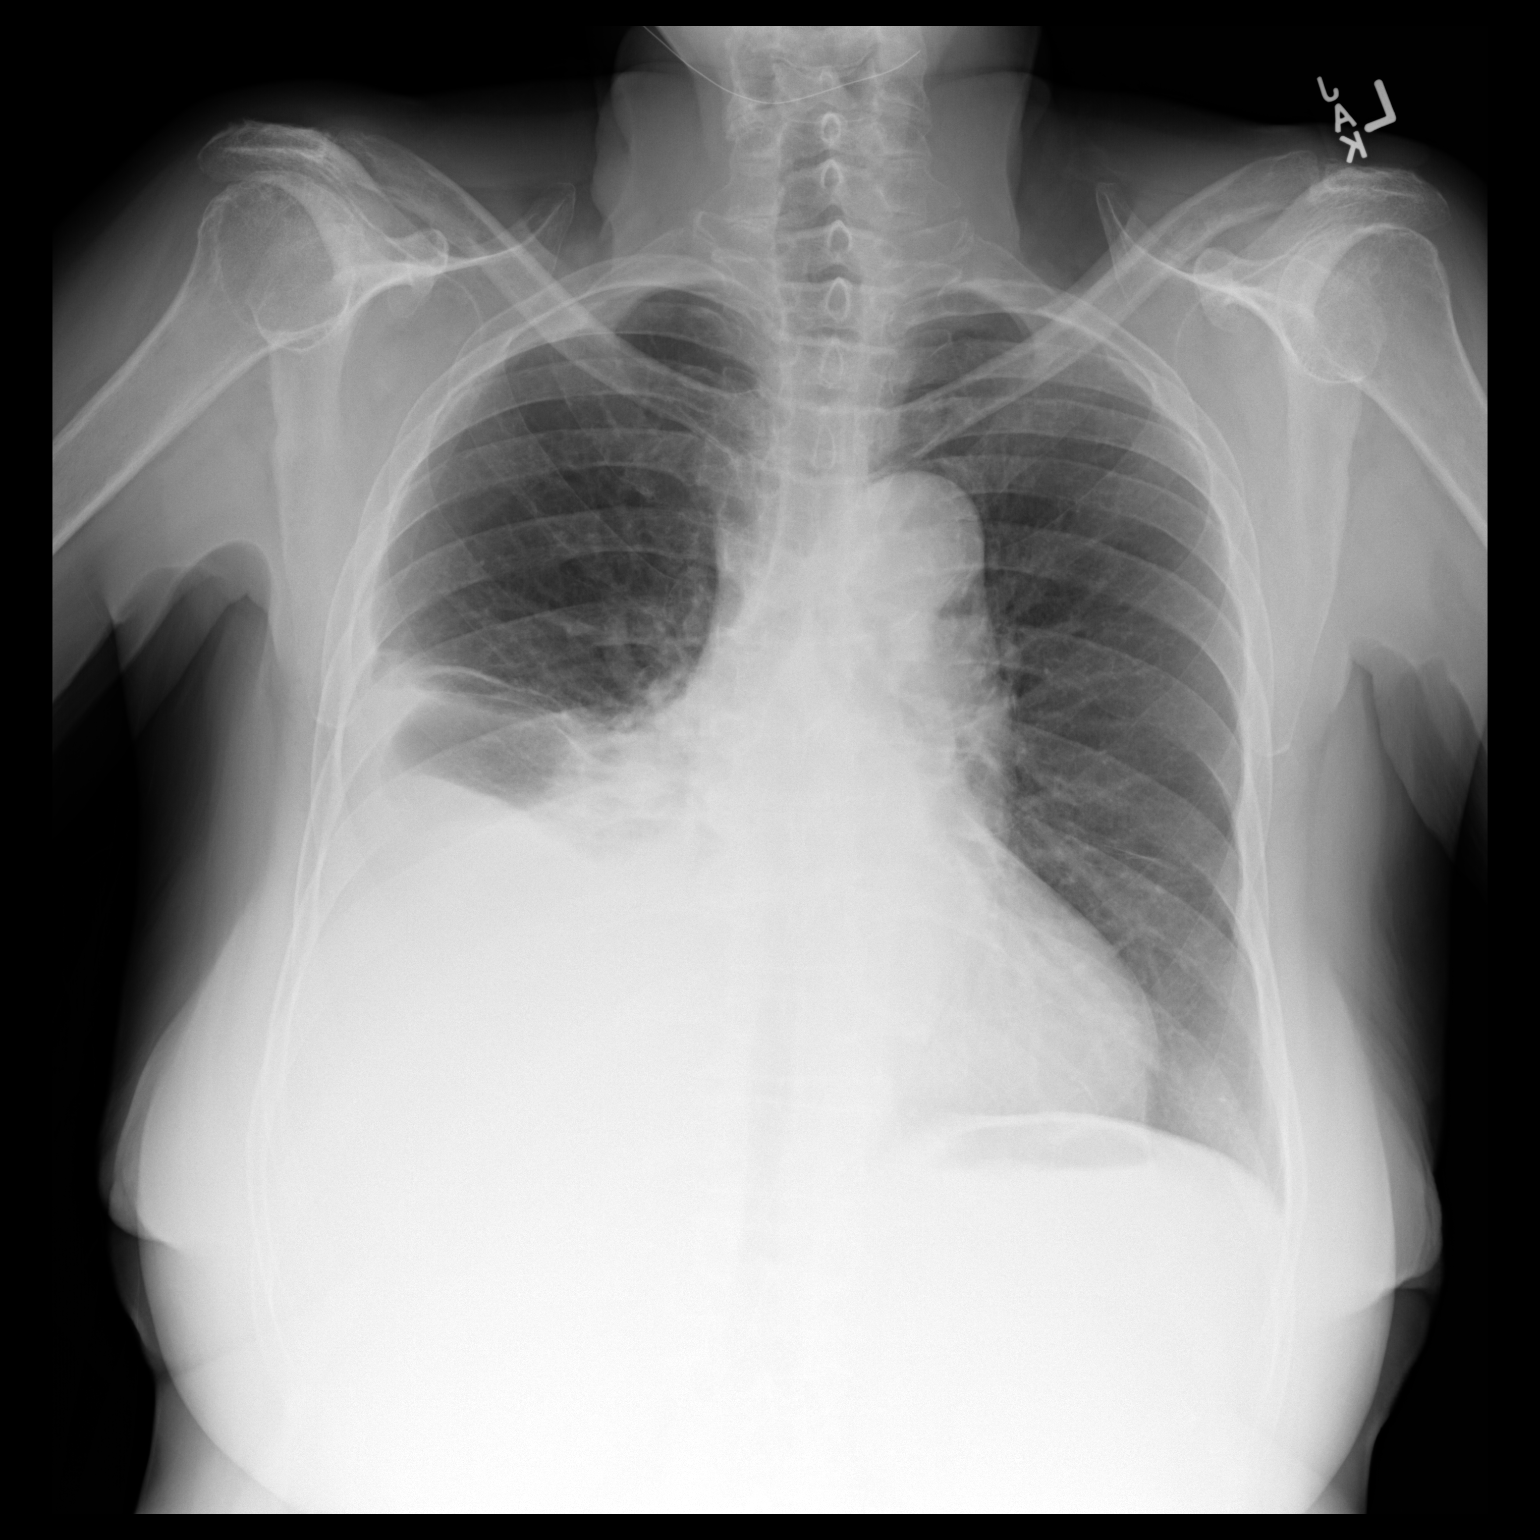

[dg chest 2 view (2 of 2)]
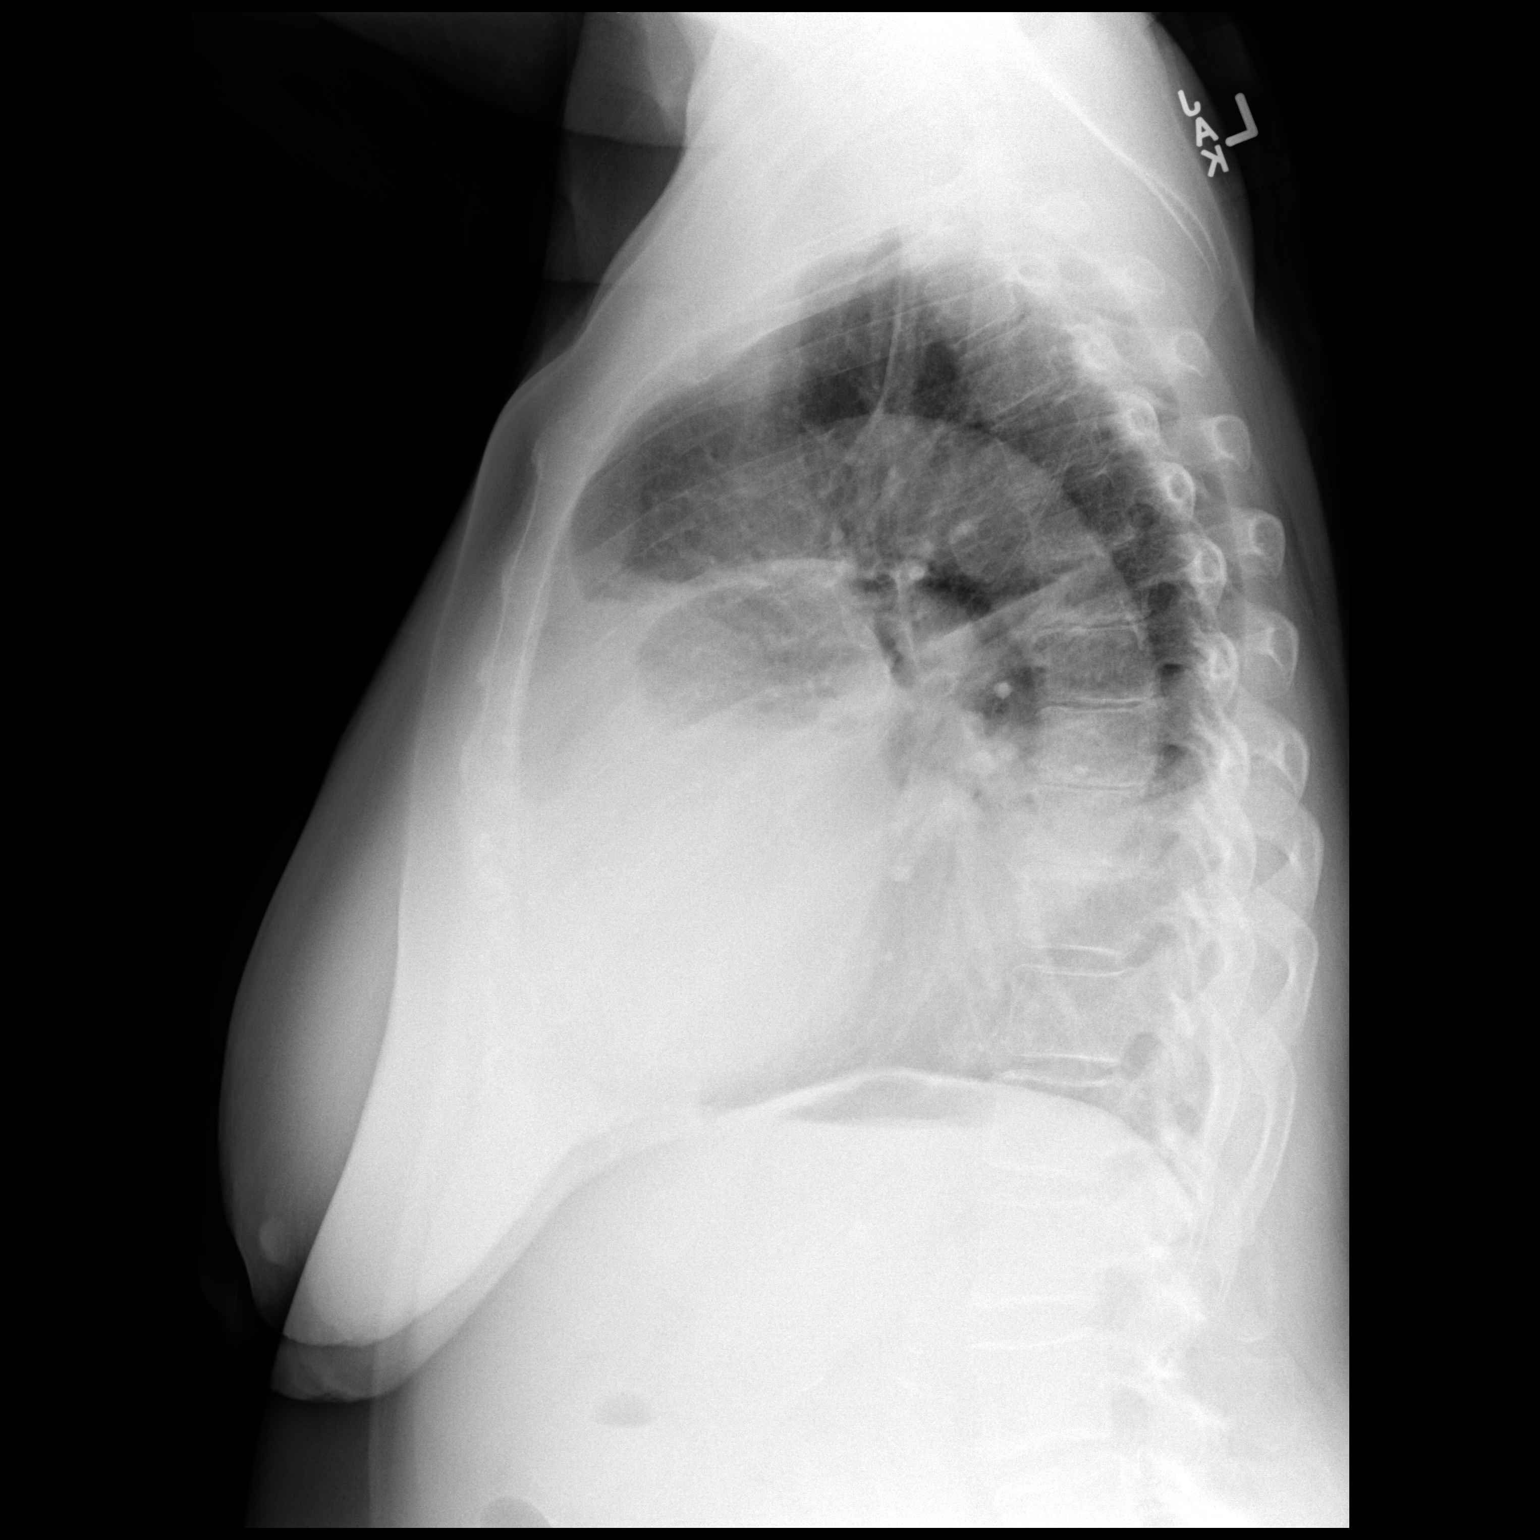

[2 of 2 positions shown; findings below may reference images not displayed]

FINDINGS: The large right pleural effusion has reaccumulated since 11/10/2018
there is secondary compressive atelectasis of the right lung.

Heart size and vascularity are normal. Left lung is clear.
IMPRESSION: Large right pleural effusion has reaccumulated since the prior
study. No other significant abnormalities.
